# Patient Record
Sex: Male | Born: 1940
Health system: Southern US, Community
[De-identification: ages and names within clinical notes are randomized; demographics above are authoritative.]

## PROBLEM LIST (undated history)

## (undated) DIAGNOSIS — I2510 Atherosclerotic heart disease of native coronary artery without angina pectoris: Secondary | ICD-10-CM

## (undated) DIAGNOSIS — I499 Cardiac arrhythmia, unspecified: Secondary | ICD-10-CM

## (undated) DIAGNOSIS — J189 Pneumonia, unspecified organism: Secondary | ICD-10-CM

## (undated) DIAGNOSIS — I509 Heart failure, unspecified: Secondary | ICD-10-CM

## (undated) DIAGNOSIS — I219 Acute myocardial infarction, unspecified: Secondary | ICD-10-CM

## (undated) DIAGNOSIS — C61 Malignant neoplasm of prostate: Secondary | ICD-10-CM

## (undated) DIAGNOSIS — E119 Type 2 diabetes mellitus without complications: Secondary | ICD-10-CM

## (undated) DIAGNOSIS — E785 Hyperlipidemia, unspecified: Secondary | ICD-10-CM

## (undated) DIAGNOSIS — R011 Cardiac murmur, unspecified: Secondary | ICD-10-CM

## (undated) DIAGNOSIS — M1990 Unspecified osteoarthritis, unspecified site: Secondary | ICD-10-CM

## (undated) DIAGNOSIS — I639 Cerebral infarction, unspecified: Secondary | ICD-10-CM

## (undated) DIAGNOSIS — I10 Essential (primary) hypertension: Secondary | ICD-10-CM

## (undated) DIAGNOSIS — J449 Chronic obstructive pulmonary disease, unspecified: Secondary | ICD-10-CM

## (undated) DIAGNOSIS — I209 Angina pectoris, unspecified: Secondary | ICD-10-CM

## (undated) DIAGNOSIS — Z86711 Personal history of pulmonary embolism: Secondary | ICD-10-CM

## (undated) DIAGNOSIS — IMO0002 Reserved for concepts with insufficient information to code with codable children: Secondary | ICD-10-CM

## (undated) DIAGNOSIS — Z950 Presence of cardiac pacemaker: Secondary | ICD-10-CM

## (undated) DIAGNOSIS — D649 Anemia, unspecified: Secondary | ICD-10-CM

## (undated) DIAGNOSIS — R0600 Dyspnea, unspecified: Secondary | ICD-10-CM

## (undated) DIAGNOSIS — I270 Primary pulmonary hypertension: Secondary | ICD-10-CM

## (undated) DIAGNOSIS — Z8673 Personal history of transient ischemic attack (TIA), and cerebral infarction without residual deficits: Secondary | ICD-10-CM

## (undated) DIAGNOSIS — N189 Chronic kidney disease, unspecified: Secondary | ICD-10-CM

## (undated) DIAGNOSIS — M797 Fibromyalgia: Secondary | ICD-10-CM

## (undated) HISTORY — DX: Atherosclerotic heart disease of native coronary artery without angina pectoris: I25.10

## (undated) HISTORY — DX: Type 2 diabetes mellitus without complications: E11.9

## (undated) HISTORY — DX: Malignant neoplasm of prostate: C61

## (undated) HISTORY — DX: Reserved for concepts with insufficient information to code with codable children: IMO0002

## (undated) HISTORY — DX: Hyperlipidemia, unspecified: E78.5

## (undated) HISTORY — DX: Essential (primary) hypertension: I10

## (undated) HISTORY — DX: Personal history of pulmonary embolism: Z86.711

## (undated) HISTORY — DX: Primary pulmonary hypertension: I27.0

## (undated) HISTORY — PX: HERNIA REPAIR: SHX51

## (undated) HISTORY — PX: INSERT / REPLACE / REMOVE PACEMAKER: SUR710

## (undated) HISTORY — PX: NO PAST SURGERIES: SHX2092

## (undated) HISTORY — PX: BACK SURGERY: SHX140

## (undated) HISTORY — PX: CORONARY ANGIOPLASTY: SHX604

## (undated) NOTE — *Deleted (*Deleted)
Family Medicine Teaching [facility] Discharge Summary  Patient name: Bryan Carney Medical record number: [id number] Date of birth: Aug 21, 1940 Age: 27 y.o. Gender: male Date of Admission: 05/12/2020  Date of Discharge: *** Admitting Physician: Carney Living, MD  Primary Care Provider: Hurshel Party, NP Consultants: ***  Indication for Hospitalization: ***  Discharge Diagnoses/Problem List:  ***  Disposition: ***  Discharge Condition: ***  Discharge Exam: [facility] Course:  Cardiology consulted and multiple medicaiton changes were made:  Simvastatin changed to crestor 10 mg  Eliquis decreased to 2.5 mg BID due to age and weigh t Lasix 20 mg PO daily  ARB switched to losartan 25 mg  Cardizem was discontinued  Metoprolol was started; however, family noted past allergy to this medication and cards was okay with not discharging him on this medication    Consider starting SGLT-2, if affordable.  Issues for Follow Up:  1. ***  Significant Procedures: ***  Significant Labs and Imaging:  Recent Labs  Lab 05/12/20 1006 05/13/20 0057 05/14/20 0400  WBC 10.7* 9.9 8.1  HGB 8.1* 8.3* 8.8*  HCT 26.2* 25.4* 26.7*  PLT 285 285 289   Recent Labs  Lab 05/12/20 1006 05/12/20 1006 05/13/20 0057 05/14/20 0400  NA 138  --  138 136  K 4.6   < > 3.6 3.7  CL 105  --  102 99  CO2 25  --  27 28  GLUCOSE 262*  --  159* 134*  BUN 21  --  20 32*  CREATININE 1.62*  --  1.69* 2.15*  CALCIUM 8.8*  --  8.8* 8.5*  ALKPHOS  --   --   --  81  AST  --   --   --  22  ALT  --   --   --  26  ALBUMIN  --   --   --  2.7*   < > = values in this interval not displayed.    ***  Results/Tests Pending at Time of Discharge: ***  Discharge Medications:  Allergies as of 05/14/2020      Reactions   Lopressor [metoprolol Tartrate] Other (See Comments)   Severe BP drop   Lipitor [atorvastatin]    Muscle aches      Medication List    STOP taking these medications    benazepril 40 MG tablet Commonly known as: LOTENSIN   diltiazem 240 MG 24 hr capsule Commonly known as: CARDIZEM CD   simvastatin 20 MG tablet Commonly known as: ZOCOR     TAKE these medications   amiodarone 200 MG tablet Commonly known as: PACERONE TAKE ONE TABLET BY MOUTH DAILY--please make overdue follow up appt for further refills [phone] 1st attempt What changed: See the new instructions.   apixaban 2.5 MG Tabs tablet Commonly known as: ELIQUIS Take 1 tablet (2.5 mg total) by mouth 2 (two) times daily. What changed:   medication strength  how much to take   aspirin EC 81 MG tablet Take 81 mg by mouth daily.   cetirizine 10 MG tablet Commonly known as: ZYRTEC Take 10 mg by mouth daily.   ELDERBERRY PO Take 300 mg by mouth daily.   ferrous sulfate 325 (65 FE) MG tablet Take 325 mg by mouth 2 (two) times daily with a meal.   furosemide 20 MG tablet Commonly known as: LASIX Take 1 tablet (20 mg total) by mouth daily.   glimepiride 4 MG tablet Commonly known as: AMARYL Take 1  tablet (4 mg total) by mouth daily with breakfast.   linagliptin 5 MG Tabs tablet Commonly known as: TRADJENTA Take 1 tablet (5 mg total) by mouth daily.   losartan 25 MG tablet Commonly known as: COZAAR Take 1 tablet (25 mg total) by mouth daily. Start taking on: May 15, 2020   pantoprazole 40 MG tablet Commonly known as: PROTONIX Take 40 mg by mouth daily.   rosuvastatin 10 MG tablet Commonly known as: CRESTOR Take 1 tablet (10 mg total) by mouth daily. Start taking on: May 15, 2020       Discharge Instructions: Please refer to Patient Instructions section of EMR for full details.  Patient was counseled important signs and symptoms that should prompt return to medical care, changes in medications, dietary instructions, activity restrictions, and follow up appointments.   Follow-Up Appointments:   Melene Plan, MD 05/14/2020, 11:37 AM PGY-***, [facility]  Health Family Medicine

---
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
## 1995-02-23 HISTORY — PX: CAROTID ENDARTERECTOMY: SUR193

## 1997-10-15 HISTORY — PX: CARDIAC CATHETERIZATION: SHX172

## 1997-10-16 ENCOUNTER — Inpatient Hospital Stay (HOSPITAL_COMMUNITY): Admission: AD | Admit: 1997-10-16 | Discharge: 1997-11-01 | Payer: Self-pay | Admitting: Cardiovascular Disease

## 1997-10-19 DIAGNOSIS — Z951 Presence of aortocoronary bypass graft: Secondary | ICD-10-CM

## 1997-10-19 HISTORY — PX: CORONARY ARTERY BYPASS GRAFT: SHX141

## 1997-10-19 HISTORY — DX: Presence of aortocoronary bypass graft: Z95.1

## 1999-05-20 ENCOUNTER — Ambulatory Visit (HOSPITAL_COMMUNITY): Admission: RE | Admit: 1999-05-20 | Discharge: 1999-05-20 | Payer: Self-pay | Admitting: Orthopedic Surgery

## 1999-05-20 ENCOUNTER — Encounter: Payer: Self-pay | Admitting: Orthopedic Surgery

## 2000-02-25 ENCOUNTER — Emergency Department (HOSPITAL_COMMUNITY): Admission: EM | Admit: 2000-02-25 | Discharge: 2000-02-25 | Payer: Self-pay | Admitting: Emergency Medicine

## 2012-06-05 ENCOUNTER — Other Ambulatory Visit (HOSPITAL_COMMUNITY): Payer: Self-pay | Admitting: Cardiovascular Disease

## 2012-06-05 DIAGNOSIS — I2510 Atherosclerotic heart disease of native coronary artery without angina pectoris: Secondary | ICD-10-CM

## 2012-06-12 ENCOUNTER — Ambulatory Visit (HOSPITAL_COMMUNITY)
Admission: RE | Admit: 2012-06-12 | Discharge: 2012-06-12 | Disposition: A | Discharge status: Home / Self Care | Payer: Medicare Other | Source: Ambulatory Visit | Attending: Cardiovascular Disease | Admitting: Cardiovascular Disease

## 2012-06-12 DIAGNOSIS — Z951 Presence of aortocoronary bypass graft: Secondary | ICD-10-CM | POA: Insufficient documentation

## 2012-06-12 DIAGNOSIS — I739 Peripheral vascular disease, unspecified: Secondary | ICD-10-CM | POA: Insufficient documentation

## 2012-06-12 DIAGNOSIS — I2510 Atherosclerotic heart disease of native coronary artery without angina pectoris: Secondary | ICD-10-CM | POA: Insufficient documentation

## 2012-06-12 DIAGNOSIS — I10 Essential (primary) hypertension: Secondary | ICD-10-CM | POA: Insufficient documentation

## 2012-06-12 DIAGNOSIS — I252 Old myocardial infarction: Secondary | ICD-10-CM | POA: Insufficient documentation

## 2012-06-12 HISTORY — PX: CARDIOVASCULAR STRESS TEST: SHX262

## 2012-06-12 MED ORDER — REGADENOSON 0.4 MG/5ML IV SOLN
0.4000 mg | Freq: Once | INTRAVENOUS | Status: AC
  Administered 2012-06-12: 0.4 mg via INTRAVENOUS

## 2012-06-12 MED ORDER — AMINOPHYLLINE 25 MG/ML IV SOLN
75.0000 mg | Freq: Once | INTRAVENOUS | Status: AC
  Administered 2012-06-12: 75 mg via INTRAVENOUS

## 2012-06-12 MED ORDER — TECHNETIUM TC 99M SESTAMIBI GENERIC - CARDIOLITE
31.4000 | Freq: Once | INTRAVENOUS | Status: AC | PRN
  Administered 2012-06-12: 31 via INTRAVENOUS

## 2012-06-12 MED ORDER — TECHNETIUM TC 99M SESTAMIBI GENERIC - CARDIOLITE
10.8000 | Freq: Once | INTRAVENOUS | Status: AC | PRN
  Administered 2012-06-12: 11 via INTRAVENOUS

## 2012-06-12 NOTE — Procedures (Addendum)
[facility] [address] [place] [address] [place] [address] [phone]  Cardiology Nuclear Med Study  Bryan Carney is a 71 y.o. male     MRN : [id number]     DOB: 1941-01-20  Procedure Date: 06/12/2012  Nuclear Med Background Indication for Stress Test:  Evaluation for Ischemia, Graft Patency and Stent Patency History:  CAD, prior MI, PVD Cardiac Risk Factors: Family History - CAD, Hypertension, Lipids and NIDDM  Symptoms:  none   Nuclear Pre-Procedure Caffeine/Decaff Intake:  7:00pm NPO After: 5:00am   IV Site: R Antecubital  IV 0.9% NS with Angio Cath:  22g  Chest Size (in):  40 IV Started by: Koren Shiver, CNMT  Height: 5\' 6"  (1.676 m)  Cup Size: n/a  BMI:  Body mass index is 21.31 kg/(m^2). Weight:  132 lb (59.875 kg)   Tech Comments:  n/a    Nuclear Med Study 1 or 2 day study: 1 day  Stress Test Type:  Lexiscan  Order Authorizing Provider:  Nanetta Batty, MD   Resting Radionuclide: Technetium 56m Sestamibi  Resting Radionuclide Dose: 10.8 mCi   Stress Radionuclide:  Technetium 61m Sestamibi  Stress Radionuclide Dose: 31.4 mCi           Stress Protocol Rest HR: 68 Stress HR: 75  Rest BP: 135/88 Stress BP: 158/68  Exercise Time (min): n/a METS: n/a   Predicted Max HR: 148 bpm % Max HR: 54.05 bpm Rate Pressure Product: 95284   Dose of Adenosine (mg):  n/a Dose of Lexiscan: 0.4 mg  Dose of Atropine (mg): n/a Dose of Dobutamine: n/a mcg/kg/min (at max HR)  Stress Test Technologist: Esperanza Sheets, CCT Nuclear Technologist: Gonzella Lex, CNMT   Rest Procedure:  Myocardial perfusion imaging was performed at rest 45 minutes following the intravenous administration of Technetium 63m Sestamibi. Stress Procedure:  The patient received IV Lexiscan 0.4 mg over 15-seconds.  Technetium 47m Sestamibi injected at 30-seconds.  There were wasn't a significant increase in the baseline NSST changes with Lexiscan.  Quantitative  spect images were obtained after a 45 minute delay.  Transient Ischemic Dilatation (Normal <1.22):  1.12 Lung/Heart Ratio (Normal <0.45):  0.29 QGS EDV:  n/a ml QGS ESV:  n/a ml LV Ejection Fraction: Study not gated  Signed by .      Rest ECG: NSR with non-specific ST-T wave changes  Stress ECG: No significant change from baseline ECG  QPS Raw Data Images:  Normal; no motion artifact; normal heart/lung ratio. Stress Images:  Normal homogeneous uptake in all areas of the myocardium. Rest Images:  Normal homogeneous uptake in all areas of the myocardium. Subtraction (SDS):  No evidence of ischemia.  Impression Exercise Capacity:  Lexiscan with no exercise. BP Response:  Hypotensive blood pressure response. Clinical Symptoms:  Pt developed hypotension with Lexiscan and almost LOC but responded to IVF resusitation ECG Impression:  Insignificant upsloping ST segment depression. Comparison with Prior Nuclear Study: No significant change from previous study  Overall Impression:  Normal stress nuclear study.  LV Wall Motion:  Gating not performed secondary to sinus arrhythmia   [name], MD  06/13/2012 2:24 PM

## 2012-07-14 ENCOUNTER — Other Ambulatory Visit (HOSPITAL_COMMUNITY): Payer: Self-pay | Admitting: Cardiovascular Disease

## 2012-07-14 DIAGNOSIS — Z9889 Other specified postprocedural states: Secondary | ICD-10-CM

## 2012-08-06 ENCOUNTER — Inpatient Hospital Stay (HOSPITAL_COMMUNITY): Admission: RE | Admit: 2012-08-06 | Payer: Medicare Other | Source: Ambulatory Visit

## 2012-08-06 ENCOUNTER — Encounter (HOSPITAL_COMMUNITY): Payer: Medicare Other

## 2012-08-27 ENCOUNTER — Inpatient Hospital Stay (HOSPITAL_COMMUNITY): Admission: RE | Admit: 2012-08-27 | Payer: Medicare Other | Source: Ambulatory Visit

## 2012-08-27 ENCOUNTER — Ambulatory Visit (HOSPITAL_COMMUNITY)
Admission: RE | Admit: 2012-08-27 | Discharge: 2012-08-27 | Disposition: A | Discharge status: Home / Self Care | Payer: Medicare Other | Source: Ambulatory Visit | Attending: Cardiovascular Disease | Admitting: Cardiovascular Disease

## 2012-08-27 ENCOUNTER — Encounter (HOSPITAL_COMMUNITY): Payer: Medicare Other

## 2012-08-27 DIAGNOSIS — Z09 Encounter for follow-up examination after completed treatment for conditions other than malignant neoplasm: Secondary | ICD-10-CM | POA: Insufficient documentation

## 2012-08-27 HISTORY — PX: OTHER SURGICAL HISTORY: SHX169

## 2012-08-27 NOTE — Progress Notes (Signed)
Carotid Duplex Completed. Sturdivant, Rita D  

## 2012-10-14 ENCOUNTER — Encounter: Payer: Self-pay | Admitting: Pharmacist Clinician (PhC)/ Clinical Pharmacy Specialist

## 2012-10-14 DIAGNOSIS — Z7901 Long term (current) use of anticoagulants: Secondary | ICD-10-CM | POA: Insufficient documentation

## 2012-10-14 DIAGNOSIS — I2699 Other pulmonary embolism without acute cor pulmonale: Secondary | ICD-10-CM | POA: Insufficient documentation

## 2012-10-16 ENCOUNTER — Encounter: Payer: Self-pay | Admitting: Cardiovascular Disease

## 2012-11-27 ENCOUNTER — Ambulatory Visit (INDEPENDENT_AMBULATORY_CARE_PROVIDER_SITE_OTHER): Payer: Self-pay | Admitting: Pharmacist Clinician (PhC)/ Clinical Pharmacy Specialist

## 2012-11-27 DIAGNOSIS — Z7901 Long term (current) use of anticoagulants: Secondary | ICD-10-CM

## 2012-11-27 DIAGNOSIS — I2699 Other pulmonary embolism without acute cor pulmonale: Secondary | ICD-10-CM

## 2013-01-09 ENCOUNTER — Ambulatory Visit (INDEPENDENT_AMBULATORY_CARE_PROVIDER_SITE_OTHER): Payer: Self-pay | Admitting: Pharmacist Clinician (PhC)/ Clinical Pharmacy Specialist

## 2013-01-09 DIAGNOSIS — Z7901 Long term (current) use of anticoagulants: Secondary | ICD-10-CM

## 2013-01-09 DIAGNOSIS — I2699 Other pulmonary embolism without acute cor pulmonale: Secondary | ICD-10-CM

## 2013-01-09 LAB — PROTIME-INR

## 2013-02-03 LAB — PROTIME-INR: INR: 2.5 — AB (ref <=1.1)

## 2013-02-05 ENCOUNTER — Telehealth: Payer: Self-pay | Admitting: Pharmacist Clinician (PhC)/ Clinical Pharmacy Specialist

## 2013-02-05 NOTE — Telephone Encounter (Signed)
Wants to know what his PT is? Also need to know what dose he needs to take please.pt

## 2013-02-06 ENCOUNTER — Ambulatory Visit (INDEPENDENT_AMBULATORY_CARE_PROVIDER_SITE_OTHER): Payer: Self-pay | Admitting: Pharmacist Clinician (PhC)/ Clinical Pharmacy Specialist

## 2013-02-06 ENCOUNTER — Telehealth: Payer: Self-pay | Admitting: Cardiovascular Disease

## 2013-02-06 DIAGNOSIS — I2699 Other pulmonary embolism without acute cor pulmonale: Secondary | ICD-10-CM

## 2013-02-06 DIAGNOSIS — Z7901 Long term (current) use of anticoagulants: Secondary | ICD-10-CM

## 2013-02-06 NOTE — Telephone Encounter (Signed)
Had inr done 2 days ago  Have not gotten results  Would like before going into weekend  Please call

## 2013-02-06 NOTE — Telephone Encounter (Signed)
Pt notified of dose/INR

## 2013-05-01 LAB — PROTIME-INR: INR: 3 — AB (ref <=1.1)

## 2013-05-04 ENCOUNTER — Ambulatory Visit (INDEPENDENT_AMBULATORY_CARE_PROVIDER_SITE_OTHER): Payer: Medicare Other | Admitting: Pharmacist Clinician (PhC)/ Clinical Pharmacy Specialist

## 2013-05-04 DIAGNOSIS — Z7901 Long term (current) use of anticoagulants: Secondary | ICD-10-CM

## 2013-05-04 DIAGNOSIS — I2699 Other pulmonary embolism without acute cor pulmonale: Secondary | ICD-10-CM

## 2013-06-12 ENCOUNTER — Telehealth: Payer: Self-pay | Admitting: Cardiovascular Disease

## 2013-06-12 NOTE — Telephone Encounter (Signed)
Pt was returning your call.

## 2013-06-12 NOTE — Telephone Encounter (Signed)
Carotid dopplers due August 28, 2014 per last result Corrie Dandy 2014).  Unsure about stress test.  Message forwarded to K. Petra Kuba, RN to discuss w/ Dr. Allyson Sabal.

## 2013-06-12 NOTE — Telephone Encounter (Signed)
Carotid dopplers are due in March 2015 and no order was placed for stress test.  Last stress test was 12/13.  Normally insurance will not pay this soon without symptoms; therefore, we will not proceed with an order for one at this time.  We can discuss it at office visit in Jan. 2015.  Left message for patient.

## 2013-06-12 NOTE — Telephone Encounter (Signed)
I spoke patient's wife and she verbalized understanding.

## 2013-06-12 NOTE — Telephone Encounter (Signed)
Pt has appt with Bryan Carney on 07/21/13. Does he need carotid dopplers or a stress test before he comes in?

## 2013-07-07 ENCOUNTER — Ambulatory Visit (INDEPENDENT_AMBULATORY_CARE_PROVIDER_SITE_OTHER): Payer: Medicare Other | Admitting: Pharmacist Clinician (PhC)/ Clinical Pharmacy Specialist

## 2013-07-07 DIAGNOSIS — Z7901 Long term (current) use of anticoagulants: Secondary | ICD-10-CM

## 2013-07-07 DIAGNOSIS — I2699 Other pulmonary embolism without acute cor pulmonale: Secondary | ICD-10-CM

## 2013-07-07 LAB — PROTIME-INR: INR: 2.8 — AB (ref <=1.1)

## 2013-07-21 ENCOUNTER — Encounter: Payer: Self-pay | Admitting: Cardiovascular Disease

## 2013-07-21 ENCOUNTER — Ambulatory Visit (INDEPENDENT_AMBULATORY_CARE_PROVIDER_SITE_OTHER): Payer: Medicare Other | Admitting: Cardiovascular Disease

## 2013-07-21 VITALS — BP 158/88 | HR 68 | Ht 66.0 in | Wt 130.6 lb

## 2013-07-21 DIAGNOSIS — I2699 Other pulmonary embolism without acute cor pulmonale: Secondary | ICD-10-CM

## 2013-07-21 DIAGNOSIS — Z7901 Long term (current) use of anticoagulants: Secondary | ICD-10-CM

## 2013-07-21 DIAGNOSIS — I10 Essential (primary) hypertension: Secondary | ICD-10-CM

## 2013-07-21 DIAGNOSIS — I2510 Atherosclerotic heart disease of native coronary artery without angina pectoris: Secondary | ICD-10-CM

## 2013-07-21 NOTE — Progress Notes (Signed)
Patient ID: Bryan Carney, male   DOB: 04/02/1941, 73 y.o.   MRN: [id number]  Chief Complaint  Patient presents with  . Follow-up    yearly follow-up   HPI Bryan Carney is a 73 y.o. male with h/o CAD, PVOD, HTN, hyperlipidemia, DM2, remote pulmonary embolus on coumadin who presents for yearly follow up.  He had bypass grafting on April 1999 and a history of left carotid endarterectomy performed by Dr. Victorino Dike on August 31st 1996.  He had a a Myoview stress test on12/19/13 which was normal although he did become hypotensive with it. He had a carotid doppler on 08/27/2012 which showed mild to moderate fibrous plaque with 0-49% stenosis in the right ICA and trace to mild plaque in the left CEA site with 0-49% stenosis. Recommended repeat in 24 months.   He reports doing well. He continues to work at a [employer]. He denies any chest pain, shortness of breath, palpitations, lower extremity swelling, dizziness or light headedness. He doesn't exercise although he walks a lot through work. He has never smoked. He reports compliance with medications.  Past Medical History  Diagnosis Date  . Hyperlipidemia   . Hypertension   . Hx pulmonary embolism   . CAD (coronary artery disease)   . PVOD (pulmonary veno-occlusive disease)     Past Surgical History  Procedure Laterality Date  . Carotid doppler  08/27/2012    Rt bulb/proximal ICA demonstrated a mild-moderate amount of fibrous plaque without evidence of a significant diameter reduction or any other vascular abnormality.  . Cardiac catheterization  10/15/1997    Recommend complete revascularization by CABG  . Cardiovascular stress test  06/12/2012    No evidence of ischemia  . Carotid endarterectomy Left 02/23/1995  . Coronary artery bypass graft  10/19/1997    x5. LIMA-LAD, SVG-PDA, SVG-OM1, seq SVG-fist and second branches of ramus intermedius.    No family history on file.  Social History History  Substance Use Topics  . Smoking  status: Never Smoker   . Smokeless tobacco: Not on file  . Alcohol Use: Not on file    Allergies  Allergen Reactions  . Lopressor [Metoprolol Tartrate] Other (See Comments)    Severe BP drop    Current Outpatient Prescriptions  Medication Sig Dispense Refill  . amLODipine (NORVASC) 10 MG tablet Take 10 mg by mouth daily.      Marland Kitchen aspirin 325 MG tablet Take 325 mg by mouth daily.      . benazepril (LOTENSIN) 40 MG tablet Take 40 mg by mouth daily.      . cetirizine (ZYRTEC) 10 MG tablet Take 10 mg by mouth daily.      . metFORMIN (GLUCOPHAGE) 500 MG tablet Take 1,000 mg by mouth 2 (two) times daily with a meal.      . simvastatin (ZOCOR) 40 MG tablet Take 40 mg by mouth daily.      . tamsulosin (FLOMAX) 0.4 MG CAPS capsule Take 0.4 mg by mouth.      . warfarin (COUMADIN) 5 MG tablet Take 5 mg by mouth daily.       No current facility-administered medications for this visit.    Review of Systems Review of Systems Negative except per HPI Blood pressure 158/88, pulse 68, height 5\' 6"  (1.676 m), weight 130 lb 9.6 oz (59.24 kg).  Physical Exam Physical Exam General: well appearing, white, thin gentleman, in no acute distress HEENT: EOMI, no JVD, right carotid bruit appreciated CV: S1S2, RRR,  2/6 systolic murmur best heard at right sternal border Pulm: clear to auscultation, normal work of breathing, no wheezing or crackles Extr: no edema, palpable dorsalis pedis and radial pulses bilaterally   Data Reviewed EKG: sinus rhythm with rate of 68 bpm. Non specific ST and T wave changes. LVH with early repolarization. Normal QRS, PR interval and QTc  Lipid profile: 07/03/2013: LDL: 55, HDL: 63, Chol: 137, trig: 97  BMP:  Na: 142, K: 5.2, Chl: 103, bicarb: 23, BUN: 27, Cr: 1.37, glucose: 107 A1C: 7.1 Assessment    73 y.o. male with h/o CAD, PVOD, HTN, hyperlipidemia, DM2, remote pulmonary embolus on coumadin who presents for yearly follow up.      Plan    1. CAD: s/p bypass  grafting on April 1999. Normal lexiscan on 06/12/2012. Asymptomatic and doing well.  - stress test every 3 years - continue ASA, simvastatin, metoprolol and benazepril  2. PVOD: carotid doppler normal on 3/5/214 without significant plaque.  - repeat carotid doppler in 08/2014 3. HTN: mildly elevated today.  - follow up with PCP and continue with current therapy for now 4. DM2: followed by PCP, on metformin 5. Hyperlipidemia: well controled on simvastatin 40mg  daily.  6. H/o PE: on coumadin  Follow up in 1 year with Dr. William Dalton, [facility] 07/21/2013, 10:49 AM Family Medicine Resident, PGY-3

## 2013-07-21 NOTE — Patient Instructions (Signed)
Your physician recommends that you schedule a follow-up appointment in: 1 Year  

## 2013-08-26 LAB — PROTIME-INR: INR: 3.5 — AB (ref <=1.1)

## 2013-08-28 ENCOUNTER — Telehealth: Payer: Self-pay | Admitting: *Deleted

## 2013-08-28 ENCOUNTER — Ambulatory Visit (INDEPENDENT_AMBULATORY_CARE_PROVIDER_SITE_OTHER): Payer: Medicare Other | Admitting: Pharmacist Clinician (PhC)/ Clinical Pharmacy Specialist

## 2013-08-28 DIAGNOSIS — I2699 Other pulmonary embolism without acute cor pulmonale: Secondary | ICD-10-CM

## 2013-08-28 DIAGNOSIS — Z7901 Long term (current) use of anticoagulants: Secondary | ICD-10-CM

## 2013-08-28 NOTE — Telephone Encounter (Signed)
Pt called stating that he had his PT/INR checked at [facility] and he wanted to know what his results were.   JB

## 2013-08-28 NOTE — Telephone Encounter (Signed)
Called lab, see anticoag encounter

## 2013-09-23 LAB — PROTIME-INR: INR: 2.4 — AB (ref 0.9–1.1)

## 2013-09-24 ENCOUNTER — Ambulatory Visit (INDEPENDENT_AMBULATORY_CARE_PROVIDER_SITE_OTHER): Payer: Medicare Other | Admitting: Pharmacist Clinician (PhC)/ Clinical Pharmacy Specialist

## 2013-09-24 ENCOUNTER — Telehealth: Payer: Self-pay | Admitting: Cardiovascular Disease

## 2013-09-24 DIAGNOSIS — I2699 Other pulmonary embolism without acute cor pulmonale: Secondary | ICD-10-CM

## 2013-09-24 DIAGNOSIS — Z7901 Long term (current) use of anticoagulants: Secondary | ICD-10-CM

## 2013-09-24 NOTE — Telephone Encounter (Signed)
Wants to get inr results from yesterday.  Says no one has called him.  Please call

## 2013-09-24 NOTE — Telephone Encounter (Signed)
See anticoag note

## 2013-09-24 NOTE — Telephone Encounter (Signed)
Returned call and pt verified x 2.  Pt informed message received an no lab results in office.  Asked where he had lab checked and pt stated at [facility].  Pt informed Erasmo Downer likely hasn't received them, which is why he didn't receive a call.  Pt informed she will be notified results are pending receipt.  Pt verbalized understanding and agreed w/ plan.  Message forwarded to Tommy Medal, PharmD.

## 2013-12-02 LAB — PROTIME-INR: INR: 3.2 — AB (ref <=1.1)

## 2013-12-07 ENCOUNTER — Ambulatory Visit (INDEPENDENT_AMBULATORY_CARE_PROVIDER_SITE_OTHER): Payer: Medicare Other | Admitting: Pharmacist Clinician (PhC)/ Clinical Pharmacy Specialist

## 2013-12-07 ENCOUNTER — Telehealth: Payer: Self-pay | Admitting: Pharmacist Clinician (PhC)/ Clinical Pharmacy Specialist

## 2013-12-07 DIAGNOSIS — Z7901 Long term (current) use of anticoagulants: Secondary | ICD-10-CM

## 2013-12-07 DIAGNOSIS — I2699 Other pulmonary embolism without acute cor pulmonale: Secondary | ICD-10-CM

## 2013-12-07 NOTE — Telephone Encounter (Signed)
Patient is calling for PT results---states he had this done last week.

## 2013-12-09 ENCOUNTER — Telehealth: Payer: Self-pay | Admitting: Cardiovascular Disease

## 2013-12-09 DIAGNOSIS — Z7901 Long term (current) use of anticoagulants: Secondary | ICD-10-CM

## 2013-12-09 NOTE — Telephone Encounter (Signed)
She needs a standard order for his PT. Please fax [phone].

## 2013-12-09 NOTE — Telephone Encounter (Signed)
[facility] lab needing a standing order for INR faxed.  Done.

## 2013-12-17 ENCOUNTER — Telehealth: Payer: Self-pay | Admitting: Cardiovascular Disease

## 2013-12-17 DIAGNOSIS — I2782 Chronic pulmonary embolism: Secondary | ICD-10-CM

## 2013-12-17 NOTE — Telephone Encounter (Signed)
Order placed for standing order PT INR q2 weeks faxed to [facility]

## 2013-12-17 NOTE — Telephone Encounter (Signed)
She needs another standard order for his PT. Please fax [phone].

## 2013-12-22 LAB — PROTIME-INR: INR: 4 — AB (ref 0.9–1.1)

## 2013-12-23 ENCOUNTER — Telehealth: Payer: Self-pay | Admitting: Cardiovascular Disease

## 2013-12-23 ENCOUNTER — Ambulatory Visit (INDEPENDENT_AMBULATORY_CARE_PROVIDER_SITE_OTHER): Payer: Medicare Other | Admitting: Pharmacist Clinician (PhC)/ Clinical Pharmacy Specialist

## 2013-12-23 DIAGNOSIS — I2699 Other pulmonary embolism without acute cor pulmonale: Secondary | ICD-10-CM

## 2013-12-23 DIAGNOSIS — Z7901 Long term (current) use of anticoagulants: Secondary | ICD-10-CM

## 2013-12-23 NOTE — Telephone Encounter (Signed)
See anticoag note

## 2013-12-23 NOTE — Telephone Encounter (Signed)
Fax received. Placed in Kristin's box for review. Will forward to her.

## 2013-12-23 NOTE — Telephone Encounter (Signed)
New Prob    States she faxed over a protime on behalf of pt today around 3:30 PM. Calling to verify fax was received in our office.

## 2014-01-04 ENCOUNTER — Ambulatory Visit (INDEPENDENT_AMBULATORY_CARE_PROVIDER_SITE_OTHER): Payer: Medicare Other | Admitting: Pharmacist

## 2014-01-04 ENCOUNTER — Telehealth: Payer: Self-pay | Admitting: Physician Assistant

## 2014-01-04 DIAGNOSIS — Z7901 Long term (current) use of anticoagulants: Secondary | ICD-10-CM

## 2014-01-04 DIAGNOSIS — I2699 Other pulmonary embolism without acute cor pulmonale: Secondary | ICD-10-CM

## 2014-01-04 LAB — PROTIME-INR: INR: 2.9 — AB (ref 0.9–1.1)

## 2014-01-04 NOTE — Telephone Encounter (Signed)
The patient called wondering if he should take his coumadin.  INR is 2.9.  I told him to take his usual Monday dose of 7.5mg .  Favio Moder PA-C

## 2014-01-26 ENCOUNTER — Telehealth: Payer: Self-pay | Admitting: Cardiovascular Disease

## 2014-01-26 ENCOUNTER — Ambulatory Visit (INDEPENDENT_AMBULATORY_CARE_PROVIDER_SITE_OTHER): Payer: Medicare Other | Admitting: Pharmacist Clinician (PhC)/ Clinical Pharmacy Specialist

## 2014-01-26 DIAGNOSIS — Z7901 Long term (current) use of anticoagulants: Secondary | ICD-10-CM

## 2014-01-26 DIAGNOSIS — I2699 Other pulmonary embolism without acute cor pulmonale: Secondary | ICD-10-CM

## 2014-01-26 LAB — PROTIME-INR: INR: 2.4 — AB (ref 0.9–1.1)

## 2014-01-26 NOTE — Telephone Encounter (Signed)
New Prob    Requesting new standing orders for PT/INR for pt faxed to [phone].

## 2014-01-28 NOTE — Telephone Encounter (Signed)
Faxed orders to [facility] lab

## 2014-03-24 ENCOUNTER — Telehealth: Payer: Self-pay | Admitting: Cardiovascular Disease

## 2014-03-24 ENCOUNTER — Ambulatory Visit (INDEPENDENT_AMBULATORY_CARE_PROVIDER_SITE_OTHER): Payer: Medicare Other | Admitting: Pharmacist Clinician (PhC)/ Clinical Pharmacy Specialist

## 2014-03-24 DIAGNOSIS — I2699 Other pulmonary embolism without acute cor pulmonale: Secondary | ICD-10-CM

## 2014-03-24 DIAGNOSIS — Z7901 Long term (current) use of anticoagulants: Secondary | ICD-10-CM

## 2014-03-24 LAB — POCT INR: INR: 2.7

## 2014-03-24 LAB — PROTIME-INR: INR: 2.7 — AB (ref 0.9–1.1)

## 2014-03-24 NOTE — Telephone Encounter (Signed)
Received a call from [place] at [facility] stated she needed a new order for standing INRs and needs new ICD 10 code.Order and ICD 10 code faxed to her at fax # [phone].

## 2014-05-03 ENCOUNTER — Telehealth: Payer: Self-pay | Admitting: Cardiovascular Disease

## 2014-05-03 DIAGNOSIS — I6529 Occlusion and stenosis of unspecified carotid artery: Secondary | ICD-10-CM

## 2014-05-03 NOTE — Telephone Encounter (Signed)
Returned call to patient no answer.Left message on personal voice mail will send message to Dr.Berry's nurse Inez Catalina for advice.

## 2014-05-03 NOTE — Telephone Encounter (Signed)
Pt called in stating that he usually has a doppler done before he comes in to see Dr. [name] so he would like to know if he would like for him to have this done. Please call  Thanks

## 2014-05-10 NOTE — Telephone Encounter (Signed)
Left message with patient that order has been placed to repeat Carotid dopplers.

## 2014-05-13 ENCOUNTER — Telehealth (HOSPITAL_COMMUNITY): Payer: Self-pay | Admitting: *Deleted

## 2014-05-18 ENCOUNTER — Ambulatory Visit (INDEPENDENT_AMBULATORY_CARE_PROVIDER_SITE_OTHER): Payer: Medicare Other | Admitting: Pharmacist Clinician (PhC)/ Clinical Pharmacy Specialist

## 2014-05-18 DIAGNOSIS — I2699 Other pulmonary embolism without acute cor pulmonale: Secondary | ICD-10-CM

## 2014-05-18 LAB — PROTIME-INR: INR: 3.2 — AB (ref 0.9–1.1)

## 2014-06-02 ENCOUNTER — Ambulatory Visit (HOSPITAL_COMMUNITY)
Admission: RE | Admit: 2014-06-02 | Discharge: 2014-06-02 | Disposition: A | Discharge status: Home / Self Care | Payer: Medicare Other | Source: Ambulatory Visit | Attending: Cardiovascular Disease | Admitting: Cardiovascular Disease

## 2014-06-02 DIAGNOSIS — I6529 Occlusion and stenosis of unspecified carotid artery: Secondary | ICD-10-CM

## 2014-06-02 DIAGNOSIS — I6523 Occlusion and stenosis of bilateral carotid arteries: Secondary | ICD-10-CM | POA: Insufficient documentation

## 2014-06-02 NOTE — Progress Notes (Signed)
Carotid Duplex Completed. °Brianna L Mazza,RVT °

## 2014-06-16 ENCOUNTER — Encounter: Payer: Self-pay | Admitting: *Deleted

## 2014-06-23 ENCOUNTER — Ambulatory Visit (INDEPENDENT_AMBULATORY_CARE_PROVIDER_SITE_OTHER): Payer: Medicare Other | Admitting: Pharmacist Clinician (PhC)/ Clinical Pharmacy Specialist

## 2014-06-23 DIAGNOSIS — I2699 Other pulmonary embolism without acute cor pulmonale: Secondary | ICD-10-CM

## 2014-06-23 LAB — PROTIME-INR: INR: 3 — AB (ref 0.9–1.1)

## 2014-07-07 ENCOUNTER — Encounter: Payer: Self-pay | Admitting: Cardiovascular Disease

## 2014-07-07 ENCOUNTER — Ambulatory Visit (INDEPENDENT_AMBULATORY_CARE_PROVIDER_SITE_OTHER): Payer: Medicare Other | Admitting: Cardiovascular Disease

## 2014-07-07 DIAGNOSIS — I2510 Atherosclerotic heart disease of native coronary artery without angina pectoris: Secondary | ICD-10-CM | POA: Insufficient documentation

## 2014-07-07 DIAGNOSIS — E119 Type 2 diabetes mellitus without complications: Secondary | ICD-10-CM | POA: Insufficient documentation

## 2014-07-07 DIAGNOSIS — E785 Hyperlipidemia, unspecified: Secondary | ICD-10-CM | POA: Insufficient documentation

## 2014-07-07 DIAGNOSIS — I739 Peripheral vascular disease, unspecified: Secondary | ICD-10-CM

## 2014-07-07 DIAGNOSIS — I2583 Coronary atherosclerosis due to lipid rich plaque: Secondary | ICD-10-CM

## 2014-07-07 DIAGNOSIS — I779 Disorder of arteries and arterioles, unspecified: Secondary | ICD-10-CM | POA: Insufficient documentation

## 2014-07-07 DIAGNOSIS — I10 Essential (primary) hypertension: Secondary | ICD-10-CM

## 2014-07-07 MED ORDER — WARFARIN SODIUM 5 MG PO TABS: 5.0000 mg | ORAL_TABLET | Freq: Every day | ORAL | Status: DC

## 2014-07-07 NOTE — Assessment & Plan Note (Signed)
History of hyperlipidemia on simvastatin 40 mg a day followed by his PCP 

## 2014-07-07 NOTE — Progress Notes (Signed)
07/07/2014 JAFARI MCKILLOP   08-20-1940  [id number]  Primary Physician Laverna Peace, NP Primary Cardiologist: Lorretta Harp MD Renae Gloss   HPI:  Mr. Bryan Carney is a 74 year old thin-appearing married Caucasian male father of one daughter, grandfather of 2 grandchildren who is accompanied by his wife today. I saw him 2 years ago. He worked at a [employer] in [place]. He has a history of CAD and PAD. He had coronary artery bypass grafting April 1999 and left carotid endarterectomy by Dr. Kellie Simmering 02/23/95. His problems include treated hypertension and hyperlipidemia. He did have a remote pulmonary embolus on Coumadin anticoagulation. His last Myoview stress test performed 06/12/12 was normal. He is asymptomatic.   Current Outpatient Prescriptions  Medication Sig Dispense Refill  . amLODipine (NORVASC) 10 MG tablet Take 10 mg by mouth daily.    Marland Kitchen aspirin 325 MG tablet Take 325 mg by mouth daily.    . benazepril (LOTENSIN) 40 MG tablet Take 40 mg by mouth daily.    . cetirizine (ZYRTEC) 10 MG tablet Take 10 mg by mouth daily.    . ferrous fumarate (HEMOCYTE - 106 MG FE) 325 (106 FE) MG TABS tablet Take 1 tablet by mouth daily.    . Linagliptin-Metformin HCl 2.5-500 MG TABS Take 1 tablet by mouth 2 (two) times daily.    . simvastatin (ZOCOR) 40 MG tablet Take 40 mg by mouth daily.    . tamsulosin (FLOMAX) 0.4 MG CAPS capsule Take 0.4 mg by mouth.    . [DISCONTINUED] warfarin (COUMADIN) 5 MG tablet Take 5 mg by mouth daily.     No current facility-administered medications for this visit.    Allergies  Allergen Reactions  . Lopressor [Metoprolol Tartrate] Other (See Comments)    Severe BP drop    History   Social History  . Marital Status: Married    Spouse Name: N/A    Number of Children: N/A  . Years of Education: N/A   Occupational History  . Not on file.   Social History Main Topics  . Smoking status: Never Smoker   . Smokeless tobacco: Not on file  . Alcohol  Use: Not on file  . Drug Use: Not on file  . Sexual Activity: Not on file   Other Topics Concern  . Not on file   Social History Narrative     Review of Systems: General: negative for chills, fever, night sweats or weight changes.  Cardiovascular: negative for chest pain, dyspnea on exertion, edema, orthopnea, palpitations, paroxysmal nocturnal dyspnea or shortness of breath Dermatological: negative for rash Respiratory: negative for cough or wheezing Urologic: negative for hematuria Abdominal: negative for nausea, vomiting, diarrhea, bright red blood per rectum, melena, or hematemesis Neurologic: negative for visual changes, syncope, or dizziness All other systems reviewed and are otherwise negative except as noted above.    Blood pressure 140/70, pulse 66, height 5\' 5"  (1.651 m), weight 129 lb 4.8 oz (58.65 kg).  General appearance: alert and no distress Neck: no adenopathy, no carotid bruit, no JVD, supple, symmetrical, trachea midline and thyroid not enlarged, symmetric, no tenderness/mass/nodules Lungs: clear to auscultation bilaterally Heart: regular rate and rhythm, S1, S2 normal, no murmur, click, rub or gallop Extremities: extremities normal, atraumatic, no cyanosis or edema  EKG normal sinus rhythm at 66 without ST or T-wave changes. I personally reviewed this EKG  ASSESSMENT AND PLAN:   Hyperlipidemia History of hyperlipidemia on simvastatin 40 mg a day followed by his PCP  Essential hypertension History of hypertension with blood pressure measured at 140/70. He is on amlodipine, benazepril. Continue current meds at current dosing   Acute pulmonary embolism History of remote pulmonary embolism on Coumadin anticoagulation   Carotid artery disease History of carotid artery disease status post elective left carotid endarterectomy performed by Dr. Kellie Simmering 02/23/95. We've been following this by duplex ultrasound most recently performed last month that was  normal.   Coronary artery disease History of CAD status post stenting in the past and ultimately undergoing coronary artery bypass grafting April 1999. His last Myoview stress test 06/12/12 was normal. He denies chest pain or shortness of breath.       Lorretta Harp MD FACP,FACC,FAHA, [facility] 07/07/2014 4:32 PM

## 2014-07-07 NOTE — Patient Instructions (Signed)
Your physician wants you to follow-up in 1 year with Dr. Berry. You will receive a reminder letter in the mail 2 months in advance. If you do not receive a letter, please call our office to schedule the follow-up appointment.  

## 2014-07-07 NOTE — Assessment & Plan Note (Signed)
History of carotid artery disease status post elective left carotid endarterectomy performed by Dr. Kellie Simmering 02/23/95. We've been following this by duplex ultrasound most recently performed last month that was normal.

## 2014-07-07 NOTE — Assessment & Plan Note (Signed)
History of hypertension with blood pressure measured at 140/70. He is on amlodipine, benazepril. Continue current meds at current dosing

## 2014-07-07 NOTE — Assessment & Plan Note (Signed)
History of CAD status post stenting in the past and ultimately undergoing coronary artery bypass grafting April 1999. His last Myoview stress test 06/12/12 was normal. He denies chest pain or shortness of breath.

## 2014-07-07 NOTE — Assessment & Plan Note (Signed)
History of remote pulmonary embolism on Coumadin anticoagulation. 

## 2014-08-13 ENCOUNTER — Encounter: Payer: Self-pay | Admitting: Cardiovascular Disease

## 2014-08-13 LAB — PROTIME-INR

## 2014-08-13 LAB — POCT INR: INR: 3

## 2014-08-17 ENCOUNTER — Ambulatory Visit (INDEPENDENT_AMBULATORY_CARE_PROVIDER_SITE_OTHER): Payer: Medicare Other | Admitting: Pharmacist Clinician (PhC)/ Clinical Pharmacy Specialist

## 2014-08-17 DIAGNOSIS — C61 Malignant neoplasm of prostate: Secondary | ICD-10-CM

## 2014-08-17 DIAGNOSIS — I2699 Other pulmonary embolism without acute cor pulmonale: Secondary | ICD-10-CM

## 2014-09-13 ENCOUNTER — Telehealth: Payer: Self-pay | Admitting: Cardiovascular Disease

## 2014-09-13 NOTE — Telephone Encounter (Signed)
Per Answering Service: Please call,change in medication.

## 2014-09-13 NOTE — Telephone Encounter (Signed)
Returned call to patient. He was concerned about his newest warfarin Rx as it stated to take 1 tablet (5mg ) daily. He wanted to make sure he cuts those in half to get 7.5mg  on the days he takes that dose.   He also states he has been taking 10mg  5x week and 7.5mg  on Monday/Friday - this is different from last INR check on 2/23  He will have PT drawn at lab tomorrow.   Routed to [place] as [employer]

## 2014-09-14 LAB — PROTIME-INR
INR: 3.3 — AB (ref 0.9–1.1)
INR: 3.3 — AB (ref <=1.1)

## 2014-09-15 ENCOUNTER — Ambulatory Visit (INDEPENDENT_AMBULATORY_CARE_PROVIDER_SITE_OTHER): Payer: Medicare Other | Admitting: Pharmacist Clinician (PhC)/ Clinical Pharmacy Specialist

## 2014-09-15 DIAGNOSIS — I2699 Other pulmonary embolism without acute cor pulmonale: Secondary | ICD-10-CM

## 2014-10-12 LAB — PROTIME-INR: INR: 2.5 — AB (ref 0.9–1.1)

## 2014-10-14 ENCOUNTER — Ambulatory Visit (INDEPENDENT_AMBULATORY_CARE_PROVIDER_SITE_OTHER): Payer: Medicare Other | Admitting: Pharmacist Clinician (PhC)/ Clinical Pharmacy Specialist

## 2014-10-14 DIAGNOSIS — I2699 Other pulmonary embolism without acute cor pulmonale: Secondary | ICD-10-CM | POA: Diagnosis not present

## 2014-11-15 ENCOUNTER — Telehealth: Payer: Self-pay | Admitting: Cardiovascular Disease

## 2014-11-15 NOTE — Telephone Encounter (Signed)
Pharmacy called to inquire/get clarification on dose of warfarin.  Noted compliance w/ anticoag checks in office. Advised dose based on last encounter. They voiced understanding - will write as reported for patient for 30 day supply, refills at current dosing.

## 2014-11-16 LAB — PROTIME-INR: INR: 2.2 — AB (ref 0.9–1.1)

## 2014-11-18 ENCOUNTER — Ambulatory Visit (INDEPENDENT_AMBULATORY_CARE_PROVIDER_SITE_OTHER): Payer: Medicare Other | Admitting: Pharmacist Clinician (PhC)/ Clinical Pharmacy Specialist

## 2014-11-18 ENCOUNTER — Telehealth: Payer: Self-pay | Admitting: Cardiovascular Disease

## 2014-11-18 DIAGNOSIS — I2699 Other pulmonary embolism without acute cor pulmonale: Secondary | ICD-10-CM

## 2014-11-18 NOTE — Telephone Encounter (Signed)
INR reported to patient - he acknowledged understanding.

## 2014-11-18 NOTE — Telephone Encounter (Signed)
Pt called in stating that he had his INR checked at [facility] on 5/24 and he wanted to know what is results were. Please call   Thanks

## 2015-01-19 ENCOUNTER — Ambulatory Visit (INDEPENDENT_AMBULATORY_CARE_PROVIDER_SITE_OTHER): Payer: Medicare Other | Admitting: Pharmacist Clinician (PhC)/ Clinical Pharmacy Specialist

## 2015-01-19 DIAGNOSIS — I2699 Other pulmonary embolism without acute cor pulmonale: Secondary | ICD-10-CM

## 2015-01-19 LAB — PROTIME-INR: INR: 1.9 — AB (ref 0.9–1.1)

## 2015-03-15 LAB — PROTIME-INR: INR: 2.9 — AB (ref 0.9–1.1)

## 2015-03-16 ENCOUNTER — Ambulatory Visit (INDEPENDENT_AMBULATORY_CARE_PROVIDER_SITE_OTHER): Payer: Medicare Other | Admitting: Pharmacist Clinician (PhC)/ Clinical Pharmacy Specialist

## 2015-03-16 DIAGNOSIS — I2699 Other pulmonary embolism without acute cor pulmonale: Secondary | ICD-10-CM

## 2015-05-10 LAB — PROTIME-INR: INR: 3 — AB (ref 0.9–1.1)

## 2015-05-11 ENCOUNTER — Ambulatory Visit (INDEPENDENT_AMBULATORY_CARE_PROVIDER_SITE_OTHER): Payer: Medicare Other | Admitting: Pharmacist Clinician (PhC)/ Clinical Pharmacy Specialist

## 2015-05-11 DIAGNOSIS — Z7901 Long term (current) use of anticoagulants: Secondary | ICD-10-CM

## 2015-06-15 LAB — PROTIME-INR: INR: 2.9 — AB (ref 0.9–1.1)

## 2015-06-16 ENCOUNTER — Ambulatory Visit (INDEPENDENT_AMBULATORY_CARE_PROVIDER_SITE_OTHER): Payer: Medicare Other | Admitting: Pharmacist Clinician (PhC)/ Clinical Pharmacy Specialist

## 2015-06-16 DIAGNOSIS — Z7901 Long term (current) use of anticoagulants: Secondary | ICD-10-CM

## 2015-06-18 ENCOUNTER — Other Ambulatory Visit: Payer: Self-pay | Admitting: Cardiovascular Disease

## 2015-07-06 ENCOUNTER — Telehealth: Payer: Self-pay | Admitting: Cardiovascular Disease

## 2015-07-06 NOTE — Telephone Encounter (Signed)
Alyse Low is calling in to get a renewed order for the pt's Pro Time . Please f/u with her The fax number is [phone]  Thanks

## 2015-07-07 NOTE — Telephone Encounter (Signed)
Order faxed 1.12.17

## 2015-07-28 ENCOUNTER — Ambulatory Visit (INDEPENDENT_AMBULATORY_CARE_PROVIDER_SITE_OTHER): Payer: Medicare Other | Admitting: Pharmacist Clinician (PhC)/ Clinical Pharmacy Specialist

## 2015-07-28 DIAGNOSIS — Z7901 Long term (current) use of anticoagulants: Secondary | ICD-10-CM

## 2015-07-28 LAB — PROTIME-INR: INR: 2.7 — AB (ref 0.9–1.1)

## 2015-09-14 LAB — PROTIME-INR: INR: 2.3 — AB (ref 0.9–1.1)

## 2015-09-15 ENCOUNTER — Ambulatory Visit (INDEPENDENT_AMBULATORY_CARE_PROVIDER_SITE_OTHER): Payer: Medicare Other | Admitting: Pharmacist Clinician (PhC)/ Clinical Pharmacy Specialist

## 2015-09-15 DIAGNOSIS — Z7901 Long term (current) use of anticoagulants: Secondary | ICD-10-CM

## 2015-10-20 ENCOUNTER — Ambulatory Visit (INDEPENDENT_AMBULATORY_CARE_PROVIDER_SITE_OTHER): Payer: Medicare Other | Admitting: Pharmacist

## 2015-10-20 DIAGNOSIS — Z7901 Long term (current) use of anticoagulants: Secondary | ICD-10-CM

## 2015-10-20 LAB — POCT INR: INR: 2.2

## 2015-12-15 LAB — PROTIME-INR: INR: 2.4 — AB (ref 0.9–1.1)

## 2015-12-16 ENCOUNTER — Ambulatory Visit (INDEPENDENT_AMBULATORY_CARE_PROVIDER_SITE_OTHER): Payer: Medicare Other | Admitting: Pharmacist Clinician (PhC)/ Clinical Pharmacy Specialist

## 2015-12-16 DIAGNOSIS — Z7901 Long term (current) use of anticoagulants: Secondary | ICD-10-CM | POA: Diagnosis not present

## 2015-12-21 ENCOUNTER — Ambulatory Visit (INDEPENDENT_AMBULATORY_CARE_PROVIDER_SITE_OTHER): Payer: Medicare Other | Admitting: Allergy and Immunology

## 2015-12-21 ENCOUNTER — Encounter: Payer: Self-pay | Admitting: Allergy and Immunology

## 2015-12-21 ENCOUNTER — Telehealth: Payer: Self-pay

## 2015-12-21 VITALS — BP 140/80 | HR 88 | Temp 98.1°F | Resp 16 | Ht 64.33 in | Wt 131.0 lb

## 2015-12-21 DIAGNOSIS — J3089 Other allergic rhinitis: Secondary | ICD-10-CM

## 2015-12-21 DIAGNOSIS — J387 Other diseases of larynx: Secondary | ICD-10-CM | POA: Diagnosis not present

## 2015-12-21 DIAGNOSIS — K219 Gastro-esophageal reflux disease without esophagitis: Secondary | ICD-10-CM

## 2015-12-21 DIAGNOSIS — R05 Cough: Secondary | ICD-10-CM

## 2015-12-21 DIAGNOSIS — R059 Cough, unspecified: Secondary | ICD-10-CM

## 2015-12-21 DIAGNOSIS — J4531 Mild persistent asthma with (acute) exacerbation: Secondary | ICD-10-CM

## 2015-12-21 MED ORDER — ALBUTEROL SULFATE HFA 108 (90 BASE) MCG/ACT IN AERS
INHALATION_SPRAY | RESPIRATORY_TRACT | Status: DC
Start: 2015-12-21 — End: 2015-12-22

## 2015-12-21 MED ORDER — RANITIDINE HCL 300 MG PO TABS: 300.0000 mg | ORAL_TABLET | Freq: Every day | ORAL | Status: DC

## 2015-12-21 MED ORDER — PANTOPRAZOLE SODIUM 40 MG PO TBEC: DELAYED_RELEASE_TABLET | ORAL | Status: DC

## 2015-12-21 MED ORDER — METHYLPREDNISOLONE ACETATE 80 MG/ML IJ SUSP
80.0000 mg | Freq: Once | INTRAMUSCULAR | Status: AC
  Administered 2015-12-21: 80 mg via INTRAMUSCULAR

## 2015-12-21 NOTE — Patient Instructions (Addendum)
  1. Allergen avoidance measures  2. Treat reflux:   A. consolidate all caffeine and chocolate consumption  B. pantoprazole 40 mg one tablet in AM  C. ranitidine 300 mg one tablet in PM  3. Treat reflux:   A. OTC Rhinocort one spray each nostril one time per day  B. Asmanex HFA 200 - 2 inhalations twice a day. Spacer.  C. Depo-Medrol 80 IM delivered in clinic (check blood sugars)  4. If needed:   A. Zyrtec 10 mg one tablet one time per day  B. Proventil HFA 2 puffs every 4-6 hours  5. Evaluation with ENT to examine throat  6. Return to clinic in 3 weeks  7. Further evaluation? Chest CT scan?  8. Review previous chest x-ray report

## 2015-12-21 NOTE — Telephone Encounter (Signed)
I left message for patient to call our office. Please inform patient that he is scheduled for an appointment at [facility]NT on July 12th, needs to arrive at 2:30pm.  Thank you.

## 2015-12-21 NOTE — Progress Notes (Signed)
Dear Laverna Peace,  Thank you for referring Bryan Carney to the [facility] on 12/21/2015.   Below is a summation of this patient's evaluation and recommendations.  Thank you for your referral. I will keep you informed about this patient's response to treatment.   If you have any questions please to do hestitate to contact me.   Sincerely,  Jiles Prows, MD [facility]   ______________________________________________________________________    NEW PATIENT NOTE  Referring Provider: [name], NP Primary Provider: Laverna Peace, NP Date of office visit: 12/21/2015    Subjective:   Chief Complaint:  Bryan Carney (DOB: 1940/11/20) is a 75 y.o. male with a chief complaint of Cough  who presents to the clinic on 12/21/2015 with the following problems:  HPI: Bryan Carney presents to this clinic in evaluation of coughing. He has apparently been coughing for 6 months. He describes this cough is dry and sometimes having a stinging in his throat. There is no associated raspiness or hoarseness or throat clearing or swallowing difficulties. He does have spells of cough associated with gagging and retching and almost vomits. This appears to be precipitated by talking a lot or sometimes eating. He'll drink water which sometimes helps his cough. It does not appear as though any other therapy administered in the past has helped his cough very much. He did have his ace inhibitor removed 2 months ago but this did not affect his cough. He apparently had a chest x-ray which did not identify any significant abnormality. He denies any shortness of breath or chest tightness or wheezing. He did not have a history of childhood asthma. He does have some occasional nasal congestion and sneezing and occasionally having problems with itchy eyes but has no anosmia or ugly nasal discharge or ugly postnasal drip.  Past Medical History    Diagnosis Date  . Hyperlipidemia   . Hypertension   . Hx pulmonary embolism   . CAD (coronary artery disease)   . PVOD (pulmonary veno-occlusive disease) ([place])   . Diabetes ([place])   . Prostate cancer ([place])   . Squamous cell carcinoma [facility])     Past Surgical History  Procedure Laterality Date  . Carotid doppler  08/27/2012    Rt bulb/proximal ICA demonstrated a mild-moderate amount of fibrous plaque without evidence of a significant diameter reduction or any other vascular abnormality.  . Cardiac catheterization  10/15/1997    Recommend complete revascularization by CABG  . Cardiovascular stress test  06/12/2012    No evidence of ischemia  . Carotid endarterectomy Left 02/23/1995  . Coronary artery bypass graft  10/19/1997    x5. LIMA-LAD, SVG-PDA, SVG-OM1, seq SVG-fist and second branches of ramus intermedius.  . Back surgery        Medication List           aspirin 325 MG tablet  Take 325 mg by mouth daily.     ferrous fumarate 325 (106 Fe) MG Tabs tablet  Commonly known as:  HEMOCYTE - 106 mg FE  Take 1 tablet by mouth daily.     Linagliptin-Metformin HCl 2.5-500 MG Tabs  Take 1 tablet by mouth 2 (two) times daily.     simvastatin 40 MG tablet  Commonly known as:  ZOCOR  Take 40 mg by mouth daily.     tamsulosin 0.4 MG Caps capsule  Commonly known as:  FLOMAX  Take 0.4  mg by mouth.     warfarin 5 MG tablet  Commonly known as:  COUMADIN  Take 1.5 to 2 tablets by mouth daily as directed by coumadin clinic        Allergies  Allergen Reactions  . Lopressor [Metoprolol Tartrate] Other (See Comments)    Severe BP drop    Review of systems negative except as noted in HPI / PMHx or noted below:  Review of Systems  Constitutional: Negative.   HENT: Negative.   Eyes: Negative.   Respiratory: Negative.   Cardiovascular: Negative.   Gastrointestinal: Negative.   Genitourinary: Negative.   Musculoskeletal: Negative.   Skin: Negative.   Neurological: Negative.    Endo/Heme/Allergies: Negative.   Psychiatric/Behavioral: Negative.     Family History  Problem Relation Age of Onset  . Heart attack Father   . Cancer Sister   . Diabetes Brother   . Cancer Sister   . Diabetes Brother   . Diabetes Brother   . Diabetes Brother     Social History   Social History  . Marital Status: Married    Spouse Name: N/A  . Number of Children: N/A  . Years of Education: N/A   Occupational History  . Not on file.   Social History Main Topics  . Smoking status: Never Smoker   . Smokeless tobacco: Never Used  . Alcohol Use: Not on file  . Drug Use: Not on file  . Sexual Activity: Not on file   Other Topics Concern  . Not on file   Social History Narrative    Environmental and Social history  Lives in a house with a dry environment, no animals located inside the household, no carpeting in the bedroom, plastic on both the bed and pillow, and no smokers located inside the household. He is the owner of [employer].   Objective:   Filed Vitals:   12/21/15 1350  BP: 140/80  Pulse: 88  Temp: 98.1 F (36.7 C)  Resp: 16   Height: 5' 4.33" (163.4 cm) Weight: 131 lb (59.421 kg)  Physical Exam  Constitutional: He is well-developed, well-nourished, and in no distress.  Cough  HENT:  Head: Normocephalic. Head is without right periorbital erythema and without left periorbital erythema.  Right Ear: Tympanic membrane, external ear and ear canal normal.  Left Ear: Tympanic membrane, external ear and ear canal normal.  Nose: Nose normal. No mucosal edema or rhinorrhea.  Mouth/Throat: Oropharynx is clear and moist and mucous membranes are normal. No oropharyngeal exudate.  Eyes: Conjunctivae and lids are normal. Pupils are equal, round, and reactive to light.  Neck: Trachea normal. No tracheal deviation present. No thyromegaly present.  Cardiovascular: Normal rate, regular rhythm, S1 normal, S2 normal and normal heart sounds.   No murmur  heard. Pulmonary/Chest: Effort normal. No stridor. No tachypnea. No respiratory distress. He has no wheezes. He has no rales. He exhibits no tenderness.  Abdominal: Soft. He exhibits no distension and no mass. There is no hepatosplenomegaly. There is no tenderness. There is no rebound and no guarding.  Musculoskeletal: He exhibits no edema or tenderness.  Lymphadenopathy:       Head (right side): No tonsillar adenopathy present.       Head (left side): No tonsillar adenopathy present.    He has no cervical adenopathy.    He has no axillary adenopathy.  Neurological: He is alert. Gait normal.  Skin: No rash noted. He is not diaphoretic. No erythema. No pallor. Nails  show no clubbing.  Psychiatric: Mood and affect normal.     Diagnostics: Allergy skin tests were performed. He did not demonstrate any hypersensitivity against a screening panel of aeroallergens.  Spirometry was performed and demonstrated an FEV1 of 1.34 @ 58 % of predicted. Following the administration of nebulized albuterol his FEV1 rose to 2.11 which was an increase in the FEV1 of 57%. It should be noted that he had a difficult time performing this maneuver because of coughing.  The patient had an Asthma Control Test with the following results:  .     Assessment and Plan:    1. Cough   2. LPRD (laryngopharyngeal reflux disease)   3. Asthma, not well controlled, mild persistent, with acute exacerbation   4. Other allergic rhinitis      1. Allergen avoidance measures  2. Treat reflux:   A. consolidate all caffeine and chocolate consumption  B. pantoprazole 40 mg one tablet in AM  C. ranitidine 300 mg one tablet in PM  3. Treat reflux:   A. OTC Rhinocort one spray each nostril one time per day  B. Asmanex HFA 200 - 2 inhalations twice a day. Spacer.  C. Depo-Medrol 80 IM delivered in clinic (check blood sugars)  4. If needed:   A. Zyrtec 10 mg one tablet one time per day  B. Proventil HFA 2 puffs every 4-6  hours  5. Evaluation with ENT to examine throat  6. Return to clinic in 3 weeks  7. Further evaluation? Chest CT scan?  8. Review previous chest x-ray report  Joe will use a combination of therapy directed against both inflammation and reflux-induced respiratory disease and will have his throat evaluated by ear nose and throat physician and see what type of response we get with this therapy over the course the next 3 weeks. If he still remains with cough and I think he needs a chest CT scan. I did have a discussion with him about the use of a proton pump inhibitor and warfarin. He will have his bleeding time checked over the next week or 2.  I'll regroup with him in 3 weeks.  Jiles Prows, MD [facility]

## 2015-12-22 ENCOUNTER — Other Ambulatory Visit: Payer: Self-pay | Admitting: *Deleted

## 2015-12-22 MED ORDER — ALBUTEROL SULFATE HFA 108 (90 BASE) MCG/ACT IN AERS: INHALATION_SPRAY | RESPIRATORY_TRACT | Status: DC

## 2015-12-22 MED ORDER — BUDESONIDE 32 MCG/ACT NA SUSP
1.0000 | Freq: Every day | NASAL | Status: DC
Start: 2015-12-22 — End: 2016-05-09

## 2015-12-22 NOTE — Telephone Encounter (Signed)
Pt wife informed of appointment.

## 2016-01-11 ENCOUNTER — Ambulatory Visit (INDEPENDENT_AMBULATORY_CARE_PROVIDER_SITE_OTHER): Payer: Medicare Other | Admitting: Pharmacist

## 2016-01-11 DIAGNOSIS — Z7901 Long term (current) use of anticoagulants: Secondary | ICD-10-CM

## 2016-01-11 LAB — PROTIME-INR: INR: 3.6 — AB (ref 0.9–1.1)

## 2016-01-16 ENCOUNTER — Ambulatory Visit: Payer: Medicare Other | Admitting: Allergy and Immunology

## 2016-01-25 ENCOUNTER — Ambulatory Visit (INDEPENDENT_AMBULATORY_CARE_PROVIDER_SITE_OTHER): Payer: Medicare Other | Admitting: Pharmacist Clinician (PhC)/ Clinical Pharmacy Specialist

## 2016-01-25 DIAGNOSIS — Z7901 Long term (current) use of anticoagulants: Secondary | ICD-10-CM

## 2016-01-25 LAB — PROTIME-INR: INR: 3.4 — AB (ref 0.9–1.1)

## 2016-02-15 ENCOUNTER — Telehealth: Payer: Self-pay | Admitting: Cardiovascular Disease

## 2016-02-15 ENCOUNTER — Ambulatory Visit (INDEPENDENT_AMBULATORY_CARE_PROVIDER_SITE_OTHER): Payer: Medicare Other | Admitting: Pharmacist

## 2016-02-15 DIAGNOSIS — Z7901 Long term (current) use of anticoagulants: Secondary | ICD-10-CM

## 2016-02-15 DIAGNOSIS — Z79899 Other long term (current) drug therapy: Secondary | ICD-10-CM

## 2016-02-15 LAB — POCT INR: INR: 2.9

## 2016-02-15 NOTE — Telephone Encounter (Signed)
[facility] calling to get standing orders for pt's PT INR reordered.  Order submitted and faxed to requesting agency.

## 2016-03-14 LAB — PROTIME-INR

## 2016-03-17 LAB — PROTIME-INR: INR: 2.9 — AB (ref <=1.1)

## 2016-03-19 ENCOUNTER — Telehealth: Payer: Self-pay | Admitting: Pharmacist

## 2016-03-19 NOTE — Telephone Encounter (Signed)
Spoke to wife she stated that pt had INR drawn at [facility] lab last Wednesday. They wanted to obtain results and dosing instructions.  Per our records we have not received results.   We call lab and try to track down results. Spoke with Hilda Blades and INR being refaxed now.

## 2016-03-20 ENCOUNTER — Ambulatory Visit (INDEPENDENT_AMBULATORY_CARE_PROVIDER_SITE_OTHER): Payer: Medicare Other | Admitting: Pharmacist

## 2016-03-20 DIAGNOSIS — Z7901 Long term (current) use of anticoagulants: Secondary | ICD-10-CM

## 2016-03-20 NOTE — Telephone Encounter (Signed)
Results received. See anticoag note on 03/20/16

## 2016-03-20 NOTE — Telephone Encounter (Signed)
INR received was from 02/15/16. Spoke with Steffanie Dunn and she stated she would fax INR from 03/14/16 to our office now.

## 2016-03-22 NOTE — Telephone Encounter (Signed)
Called pt to make aware. He states he will go to lab sometime next week as he is unable to get there this week.   He states he is absolutely disgusted with all of this and very tempted to stop the medication altogether. He states he will remain on it for now.

## 2016-03-22 NOTE — Telephone Encounter (Signed)
Spoke with Steffanie Dunn and now she states the last INR they have on file is actually from 02/15/16 and not 03/14/16. She states she can see something from the 20th but there is no INR associated.  Will have pt go back to have INR redrawn. Confirmed with Steffanie Dunn he will not need additional order and is able to get drawn tomorrow morning.

## 2016-03-28 LAB — POCT INR: INR: 3

## 2016-03-29 ENCOUNTER — Ambulatory Visit (INDEPENDENT_AMBULATORY_CARE_PROVIDER_SITE_OTHER): Payer: Medicare Other | Admitting: Pharmacist

## 2016-05-09 ENCOUNTER — Encounter: Payer: Self-pay | Admitting: Cardiovascular Disease

## 2016-05-09 ENCOUNTER — Ambulatory Visit (INDEPENDENT_AMBULATORY_CARE_PROVIDER_SITE_OTHER): Payer: Medicare Other | Admitting: Cardiovascular Disease

## 2016-05-09 VITALS — BP 168/72 | HR 62 | Ht 64.0 in | Wt 132.0 lb

## 2016-05-09 DIAGNOSIS — I2510 Atherosclerotic heart disease of native coronary artery without angina pectoris: Secondary | ICD-10-CM

## 2016-05-09 DIAGNOSIS — I779 Disorder of arteries and arterioles, unspecified: Secondary | ICD-10-CM | POA: Diagnosis not present

## 2016-05-09 DIAGNOSIS — I2583 Coronary atherosclerosis due to lipid rich plaque: Secondary | ICD-10-CM

## 2016-05-09 DIAGNOSIS — I10 Essential (primary) hypertension: Secondary | ICD-10-CM | POA: Diagnosis not present

## 2016-05-09 DIAGNOSIS — I739 Peripheral vascular disease, unspecified: Secondary | ICD-10-CM

## 2016-05-09 DIAGNOSIS — R011 Cardiac murmur, unspecified: Secondary | ICD-10-CM | POA: Diagnosis not present

## 2016-05-09 NOTE — Assessment & Plan Note (Signed)
History of hypertension blood pressure measured 168/72. This is considerably higher than his other blood pressure readings in the past. He is on amlodipine and benazepril. Continue current meds at current dosing

## 2016-05-09 NOTE — Assessment & Plan Note (Signed)
History of coronary artery disease status post coronary artery bypass grafting April 1999. His last Myoview performed 06/12/12 was nonischemic. He denies chest pain or shortness of breath.

## 2016-05-09 NOTE — Assessment & Plan Note (Signed)
History of hyperlipidemia on statin therapy followed by his PCP 

## 2016-05-09 NOTE — Assessment & Plan Note (Signed)
History of carotid artery disease with very mild right ICA stenosis. He did have left carotid endarterectomy by Dr. Kellie Simmering 02/23/95.

## 2016-05-09 NOTE — Patient Instructions (Addendum)
Medication Instructions: Your physician recommends that you continue on your current medications as directed. Please refer to the Current Medication list given to you today.   Labwork: I will request labwork from Dr. Nicki Reaper.   Testing/Procedures: Your physician has requested that you have an echocardiogram. Echocardiography is a painless test that uses sound waves to create images of your heart. It provides your doctor with information about the size and shape of your heart and how well your heart's chambers and valves are working. This procedure takes approximately one hour. There are no restrictions for this procedure.  Any Other Instructions will be listed below:  OK to HOLD Coumadin for 5 days prior to Endoscopy.  Follow-Up: Your physician wants you to follow-up in: 1 year with Dr. [name]. You will receive a reminder letter in the mail two months in advance. If you don't receive a letter, please call our office to schedule the follow-up appointment.  If you need a refill on your cardiac medications before your next appointment, please call your pharmacy.

## 2016-05-09 NOTE — Assessment & Plan Note (Signed)
Remote pulmonary embolism after his coronary artery bypass grafting in 1999 on Coumadin anticoagulation.

## 2016-05-09 NOTE — Progress Notes (Signed)
05/09/2016 FARDIN MALSON   18-Aug-1940  [id number]  Primary Physician Laverna Peace, NP Primary Cardiologist: Lorretta Harp MD Renae Gloss  HPI:  Mr. Portee is a 75 year old thin-appearing married Caucasian male father of one daughter, grandfather of 2 grandchildren who is accompanied by his daughter [name] today. I saw him 07/07/14 . He worked at a [employer] in [place]. He has a history of CAD and PAD. He had coronary artery bypass grafting April 1999 and left carotid endarterectomy by Dr. Kellie Simmering 02/23/95. His problems include treated hypertension and hyperlipidemia. He did have a remote pulmonary embolus on Coumadin anticoagulation. His last Myoview stress test performed 06/12/12 was normal. He is asymptomatic. He has developed a cough over the last year which has been worked up by various subspecialties still without  an etiology.   Current Outpatient Prescriptions  Medication Sig Dispense Refill  . amLODipine (NORVASC) 5 MG tablet Take 5 mg by mouth daily.    Marland Kitchen aspirin 325 MG tablet Take 325 mg by mouth daily.    . benazepril (LOTENSIN) 40 MG tablet Take 40 mg by mouth daily.    . ferrous sulfate 325 (65 FE) MG tablet Take by mouth.    Marland Kitchen KOMBIGLYZE XR 10-998 MG TB24 1 tablet daily.  0  . Magnesium Oxide 400 (240 Mg) MG TABS Take 1 tablet by mouth daily.  3  . simvastatin (ZOCOR) 40 MG tablet Take 40 mg by mouth daily.    Marland Kitchen warfarin (COUMADIN) 5 MG tablet Take 1.5 to 2 tablets by mouth daily as directed by coumadin clinic 60 tablet 2   No current facility-administered medications for this visit.     Allergies  Allergen Reactions  . Lopressor [Metoprolol Tartrate] Other (See Comments)    Severe BP drop    Social History   Social History  . Marital status: Married    Spouse name: N/A  . Number of children: N/A  . Years of education: N/A   Occupational History  . Not on file.   Social History Main Topics  . Smoking status: Never Smoker  . Smokeless  tobacco: Never Used  . Alcohol use Not on file  . Drug use: Unknown  . Sexual activity: Not on file   Other Topics Concern  . Not on file   Social History Narrative  . No narrative on file     Review of Systems: General: negative for chills, fever, night sweats or weight changes.  Cardiovascular: negative for chest pain, dyspnea on exertion, edema, orthopnea, palpitations, paroxysmal nocturnal dyspnea or shortness of breath Dermatological: negative for rash Respiratory: negative for cough or wheezing Urologic: negative for hematuria Abdominal: negative for nausea, vomiting, diarrhea, bright red blood per rectum, melena, or hematemesis Neurologic: negative for visual changes, syncope, or dizziness All other systems reviewed and are otherwise negative except as noted above.    Blood pressure (!) 168/72, pulse 62, height 5\' 4"  (1.626 m), weight 132 lb (59.9 kg).  General appearance: alert and no distress Neck: no adenopathy, no JVD, supple, symmetrical, trachea midline, thyroid not enlarged, symmetric, no tenderness/mass/nodules and Soft bilateral carotid bruits Lungs: clear to auscultation bilaterally Heart: Soft outflow tract murmur probably aortic sclerosis Extremities: extremities normal, atraumatic, no cyanosis or edema  EKG normal sinus rhythm at 62 nonspecific ST and T-wave changes. I personally reviewed this EKG  ASSESSMENT AND PLAN:   Acute pulmonary embolism Remote pulmonary embolism after his coronary artery bypass grafting in 1999 on Coumadin anticoagulation.  Essential hypertension History of hypertension blood pressure measured 168/72. This is considerably higher than his other blood pressure readings in the past. He is on amlodipine and benazepril. Continue current meds at current dosing  Hyperlipidemia History of hyperlipidemia on statin therapy followed by his PCP  Coronary artery disease History of coronary artery disease status post coronary artery bypass  grafting April 1999. His last Myoview performed 06/12/12 was nonischemic. He denies chest pain or shortness of breath.  Carotid artery disease History of carotid artery disease with very mild right ICA stenosis. He did have left carotid endarterectomy by Dr. Kellie Simmering 02/23/95.      Lorretta Harp MD FACP,FACC,FAHA, [facility] 05/09/2016 4:16 PM

## 2016-05-22 ENCOUNTER — Telehealth: Payer: Self-pay | Admitting: Pharmacist Clinician (PhC)/ Clinical Pharmacy Specialist

## 2016-05-22 NOTE — Telephone Encounter (Signed)
Spoke with wife, patient overdue for INR.  He will get this week

## 2016-05-23 LAB — POCT INR: INR: 2.1

## 2016-05-24 ENCOUNTER — Ambulatory Visit (INDEPENDENT_AMBULATORY_CARE_PROVIDER_SITE_OTHER): Payer: Medicare Other | Admitting: Pharmacist Clinician (PhC)/ Clinical Pharmacy Specialist

## 2016-05-31 ENCOUNTER — Other Ambulatory Visit: Payer: Self-pay

## 2016-05-31 ENCOUNTER — Ambulatory Visit (HOSPITAL_COMMUNITY): Payer: Medicare Other | Attending: Cardiology

## 2016-05-31 DIAGNOSIS — R011 Cardiac murmur, unspecified: Secondary | ICD-10-CM | POA: Diagnosis not present

## 2016-05-31 DIAGNOSIS — I340 Nonrheumatic mitral (valve) insufficiency: Secondary | ICD-10-CM | POA: Insufficient documentation

## 2016-06-12 ENCOUNTER — Ambulatory Visit (INDEPENDENT_AMBULATORY_CARE_PROVIDER_SITE_OTHER): Payer: Medicare Other | Admitting: Pharmacist

## 2016-06-12 LAB — POCT INR: INR: 2

## 2016-07-26 ENCOUNTER — Ambulatory Visit (INDEPENDENT_AMBULATORY_CARE_PROVIDER_SITE_OTHER): Payer: Medicare Other | Admitting: Pharmacist

## 2016-07-26 DIAGNOSIS — I2699 Other pulmonary embolism without acute cor pulmonale: Secondary | ICD-10-CM

## 2016-07-26 LAB — PROTIME-INR: INR: 2.2 — AB (ref 0.9–1.1)

## 2016-08-29 ENCOUNTER — Ambulatory Visit (INDEPENDENT_AMBULATORY_CARE_PROVIDER_SITE_OTHER): Payer: Medicare Other | Admitting: Pharmacist

## 2016-08-29 DIAGNOSIS — I2699 Other pulmonary embolism without acute cor pulmonale: Secondary | ICD-10-CM

## 2016-08-29 LAB — PROTIME-INR: INR: 2.8 — AB (ref 0.9–1.1)

## 2016-10-10 ENCOUNTER — Ambulatory Visit (INDEPENDENT_AMBULATORY_CARE_PROVIDER_SITE_OTHER): Payer: Medicare Other | Admitting: Pharmacist

## 2016-10-10 DIAGNOSIS — Z7901 Long term (current) use of anticoagulants: Secondary | ICD-10-CM

## 2016-10-10 DIAGNOSIS — I2699 Other pulmonary embolism without acute cor pulmonale: Secondary | ICD-10-CM

## 2016-10-10 LAB — PROTIME-INR: INR: 2.2 — AB (ref 0.9–1.1)

## 2016-11-07 ENCOUNTER — Ambulatory Visit (INDEPENDENT_AMBULATORY_CARE_PROVIDER_SITE_OTHER): Payer: Medicare Other | Admitting: Pharmacist Clinician (PhC)/ Clinical Pharmacy Specialist

## 2016-11-07 DIAGNOSIS — I2699 Other pulmonary embolism without acute cor pulmonale: Secondary | ICD-10-CM

## 2016-11-07 DIAGNOSIS — Z7901 Long term (current) use of anticoagulants: Secondary | ICD-10-CM

## 2016-11-07 LAB — PROTIME-INR: INR: 2.7 — AB (ref 0.9–1.1)

## 2016-12-12 ENCOUNTER — Ambulatory Visit (INDEPENDENT_AMBULATORY_CARE_PROVIDER_SITE_OTHER): Payer: Medicare Other | Admitting: Pharmacist Clinician (PhC)/ Clinical Pharmacy Specialist

## 2016-12-12 DIAGNOSIS — I2699 Other pulmonary embolism without acute cor pulmonale: Secondary | ICD-10-CM

## 2016-12-12 DIAGNOSIS — Z7901 Long term (current) use of anticoagulants: Secondary | ICD-10-CM

## 2016-12-12 LAB — PROTIME-INR: INR: 2.7 — AB (ref 0.9–1.1)

## 2017-01-09 ENCOUNTER — Ambulatory Visit (INDEPENDENT_AMBULATORY_CARE_PROVIDER_SITE_OTHER): Payer: Medicare Other | Admitting: Pharmacist

## 2017-01-09 DIAGNOSIS — Z7901 Long term (current) use of anticoagulants: Secondary | ICD-10-CM

## 2017-01-09 DIAGNOSIS — I2699 Other pulmonary embolism without acute cor pulmonale: Secondary | ICD-10-CM

## 2017-01-09 LAB — PROTIME-INR: INR: 2.5 — AB (ref 0.9–1.1)

## 2017-02-06 ENCOUNTER — Ambulatory Visit (INDEPENDENT_AMBULATORY_CARE_PROVIDER_SITE_OTHER): Payer: Medicare Other | Admitting: Pharmacist

## 2017-02-06 DIAGNOSIS — I2699 Other pulmonary embolism without acute cor pulmonale: Secondary | ICD-10-CM

## 2017-02-06 DIAGNOSIS — Z7901 Long term (current) use of anticoagulants: Secondary | ICD-10-CM

## 2017-02-06 LAB — PROTIME-INR: INR: 1 (ref 0.9–1.1)

## 2017-02-15 ENCOUNTER — Ambulatory Visit (INDEPENDENT_AMBULATORY_CARE_PROVIDER_SITE_OTHER): Payer: Medicare Other | Admitting: Pharmacist Clinician (PhC)/ Clinical Pharmacy Specialist

## 2017-02-15 DIAGNOSIS — Z7901 Long term (current) use of anticoagulants: Secondary | ICD-10-CM

## 2017-02-15 DIAGNOSIS — I2699 Other pulmonary embolism without acute cor pulmonale: Secondary | ICD-10-CM

## 2017-02-15 LAB — PROTIME-INR: INR: 0.9 (ref 0.9–1.1)

## 2017-02-20 ENCOUNTER — Ambulatory Visit (INDEPENDENT_AMBULATORY_CARE_PROVIDER_SITE_OTHER): Payer: Medicare Other | Admitting: Pharmacist Clinician (PhC)/ Clinical Pharmacy Specialist

## 2017-02-20 DIAGNOSIS — Z7901 Long term (current) use of anticoagulants: Secondary | ICD-10-CM

## 2017-02-20 DIAGNOSIS — I2699 Other pulmonary embolism without acute cor pulmonale: Secondary | ICD-10-CM

## 2017-02-20 LAB — PROTIME-INR: INR: 1.4 — AB (ref 0.9–1.1)

## 2017-02-21 ENCOUNTER — Telehealth: Payer: Self-pay | Admitting: Pharmacist Clinician (PhC)/ Clinical Pharmacy Specialist

## 2017-02-21 NOTE — Telephone Encounter (Signed)
Coumadin letter 

## 2017-03-05 ENCOUNTER — Ambulatory Visit (INDEPENDENT_AMBULATORY_CARE_PROVIDER_SITE_OTHER): Payer: Medicare Other | Admitting: Pharmacist Clinician (PhC)/ Clinical Pharmacy Specialist

## 2017-03-05 DIAGNOSIS — I2699 Other pulmonary embolism without acute cor pulmonale: Secondary | ICD-10-CM | POA: Diagnosis not present

## 2017-03-05 DIAGNOSIS — Z7901 Long term (current) use of anticoagulants: Secondary | ICD-10-CM

## 2017-03-05 LAB — PROTIME-INR: INR: 2.9 — AB (ref 0.9–1.1)

## 2017-03-18 ENCOUNTER — Emergency Department (HOSPITAL_COMMUNITY): Payer: Medicare Other

## 2017-03-18 ENCOUNTER — Encounter (HOSPITAL_COMMUNITY): Payer: Self-pay | Admitting: *Deleted

## 2017-03-18 DIAGNOSIS — I2510 Atherosclerotic heart disease of native coronary artery without angina pectoris: Secondary | ICD-10-CM | POA: Insufficient documentation

## 2017-03-18 DIAGNOSIS — X58XXXA Exposure to other specified factors, initial encounter: Secondary | ICD-10-CM | POA: Insufficient documentation

## 2017-03-18 DIAGNOSIS — E119 Type 2 diabetes mellitus without complications: Secondary | ICD-10-CM | POA: Insufficient documentation

## 2017-03-18 DIAGNOSIS — Z79899 Other long term (current) drug therapy: Secondary | ICD-10-CM | POA: Insufficient documentation

## 2017-03-18 DIAGNOSIS — Y929 Unspecified place or not applicable: Secondary | ICD-10-CM | POA: Insufficient documentation

## 2017-03-18 DIAGNOSIS — Y999 Unspecified external cause status: Secondary | ICD-10-CM

## 2017-03-18 DIAGNOSIS — Z8546 Personal history of malignant neoplasm of prostate: Secondary | ICD-10-CM | POA: Insufficient documentation

## 2017-03-18 DIAGNOSIS — S20211A Contusion of right front wall of thorax, initial encounter: Secondary | ICD-10-CM

## 2017-03-18 DIAGNOSIS — Z85828 Personal history of other malignant neoplasm of skin: Secondary | ICD-10-CM | POA: Insufficient documentation

## 2017-03-18 DIAGNOSIS — Z7984 Long term (current) use of oral hypoglycemic drugs: Secondary | ICD-10-CM | POA: Insufficient documentation

## 2017-03-18 DIAGNOSIS — I10 Essential (primary) hypertension: Secondary | ICD-10-CM | POA: Insufficient documentation

## 2017-03-18 DIAGNOSIS — Z951 Presence of aortocoronary bypass graft: Secondary | ICD-10-CM

## 2017-03-18 DIAGNOSIS — Z7901 Long term (current) use of anticoagulants: Secondary | ICD-10-CM

## 2017-03-18 DIAGNOSIS — Z7982 Long term (current) use of aspirin: Secondary | ICD-10-CM

## 2017-03-18 DIAGNOSIS — D62 Acute posthemorrhagic anemia: Secondary | ICD-10-CM | POA: Diagnosis not present

## 2017-03-18 DIAGNOSIS — Y939 Activity, unspecified: Secondary | ICD-10-CM

## 2017-03-18 LAB — COMPREHENSIVE METABOLIC PANEL
ALBUMIN: 4 g/dL (ref 3.5–5.0)
ALT: 13 U/L — ABNORMAL LOW (ref 17–63)
ANION GAP: 5 (ref 5–15)
AST: 21 U/L (ref 15–41)
Alkaline Phosphatase: 68 U/L (ref 38–126)
BILIRUBIN TOTAL: 1.5 mg/dL — AB (ref 0.3–1.2)
BUN: 23 mg/dL — ABNORMAL HIGH (ref 6–20)
CO2: 24 mmol/L (ref 22–32)
Calcium: 8.8 mg/dL — ABNORMAL LOW (ref 8.9–10.3)
Chloride: 108 mmol/L (ref 101–111)
Creatinine, Ser: 1.59 mg/dL — ABNORMAL HIGH (ref 0.61–1.24)
GFR calc non Af Amer: 40 mL/min — ABNORMAL LOW (ref >60)
GFR, EST AFRICAN AMERICAN: 47 mL/min — AB (ref >60)
GLUCOSE: 197 mg/dL — AB (ref 65–99)
POTASSIUM: 4.5 mmol/L (ref 3.5–5.1)
SODIUM: 137 mmol/L (ref 135–145)
TOTAL PROTEIN: 5.9 g/dL — AB (ref 6.5–8.1)

## 2017-03-18 LAB — CBC
HEMATOCRIT: 34.4 % — AB (ref 39.0–52.0)
Hemoglobin: 11.2 g/dL — ABNORMAL LOW (ref 13.0–17.0)
MCH: 31.7 pg (ref 26.0–34.0)
MCHC: 32.6 g/dL (ref 30.0–36.0)
MCV: 97.5 fL (ref 78.0–100.0)
PLATELETS: 193 10*3/uL (ref 150–400)
RBC: 3.53 MIL/uL — ABNORMAL LOW (ref 4.22–5.81)
RDW: 12.5 % (ref 11.5–15.5)
WBC: 11 10*3/uL — AB (ref 4.0–10.5)

## 2017-03-18 LAB — PROTIME-INR
INR: 3.06
Prothrombin Time: 31.3 seconds — ABNORMAL HIGH (ref 11.4–15.2)

## 2017-03-18 NOTE — ED Triage Notes (Signed)
The pt is c/o rt upper chest bruise just below the clavicle  Andhe has a large bruise to the rt breast and the rt lateral chest  He just noticed when he   Took a shower this afternoon  He has not had an injury.  He just felt a soreness rt chest then he saw the bruises.  He is on coumadin and he took 7.5 mg today just before he noticed the bruising.  Alert oriented skin  Warm and dry

## 2017-03-19 ENCOUNTER — Emergency Department (HOSPITAL_COMMUNITY): Payer: Medicare Other

## 2017-03-19 ENCOUNTER — Emergency Department (HOSPITAL_COMMUNITY)
Admission: EM | Admit: 2017-03-19 | Discharge: 2017-03-19 | Disposition: A | Discharge status: Home / Self Care | Payer: Medicare Other | Source: Home / Self Care | Attending: Emergency Medicine | Admitting: Emergency Medicine

## 2017-03-19 ENCOUNTER — Other Ambulatory Visit: Payer: Self-pay

## 2017-03-19 DIAGNOSIS — S20211A Contusion of right front wall of thorax, initial encounter: Secondary | ICD-10-CM

## 2017-03-19 LAB — HEMOGLOBIN AND HEMATOCRIT, BLOOD
HCT: 28.8 % — ABNORMAL LOW (ref 39.0–52.0)
Hemoglobin: 9.5 g/dL — ABNORMAL LOW (ref 13.0–17.0)

## 2017-03-19 LAB — CBG MONITORING, ED: GLUCOSE-CAPILLARY: 156 mg/dL — AB (ref 65–99)

## 2017-03-19 MED ORDER — IOPAMIDOL (ISOVUE-300) INJECTION 61%
INTRAVENOUS | Status: AC
  Administered 2017-03-19: 75 mL
  Filled 2017-03-19: qty 75

## 2017-03-19 MED ORDER — SODIUM CHLORIDE 0.9 % IV BOLUS (SEPSIS)
1000.0000 mL | Freq: Once | INTRAVENOUS | Status: AC
  Administered 2017-03-19: 1000 mL via INTRAVENOUS

## 2017-03-19 MED ORDER — HYDROCODONE-ACETAMINOPHEN 5-325 MG PO TABS: 1.0000 | ORAL_TABLET | Freq: Four times a day (QID) | ORAL | 0 refills | Status: DC | PRN

## 2017-03-19 MED ORDER — OXYCODONE-ACETAMINOPHEN 5-325 MG PO TABS
1.0000 | ORAL_TABLET | Freq: Once | ORAL | Status: AC
  Administered 2017-03-19: 1 via ORAL
  Filled 2017-03-19: qty 1

## 2017-03-19 NOTE — ED Provider Notes (Addendum)
[facility] DEPT Provider Note   CSN: [id number] Arrival date & time: 03/18/17  1905     History   Chief Complaint Chief Complaint  Patient presents with  . Chest Pain    HPI Bryan Carney is a 76 y.o. male.  HPI  This is a 76 year old male with a history of coronary artery disease, diabetes, hypertension, hyperlipidemia who presents with chest wall pain. Patient noted onset of pain yesterday morning. Denies injury. As the day progressed he noted increasing bruising of the right pectoral area. Some of the pain is worse with movement. He is currently comfortable laying still. Denies any shortness of breath. Patient is on Coumadin. Last INR 2.9.  Past Medical History:  Diagnosis Date  . CAD (coronary artery disease)   . Diabetes ([place])   . Hx pulmonary embolism   . Hyperlipidemia   . Hypertension   . Prostate cancer ([place])   . PVOD (pulmonary veno-occlusive disease) ([place])   . Squamous cell carcinoma     Patient Active Problem List   Diagnosis Date Noted  . Essential hypertension 07/07/2014  . Hyperlipidemia 07/07/2014  . Coronary artery disease 07/07/2014  . Carotid artery disease ([place]) 07/07/2014  . Diabetes ([place]) 07/07/2014  . Long term (current) use of anticoagulants 10/14/2012  . Acute pulmonary embolism ([place]) 10/14/2012    Past Surgical History:  Procedure Laterality Date  . BACK SURGERY    . CARDIAC CATHETERIZATION  10/15/1997   Recommend complete revascularization by CABG  . CARDIOVASCULAR STRESS TEST  06/12/2012   No evidence of ischemia  . CAROTID DOPPLER  08/27/2012   Rt bulb/proximal ICA demonstrated a mild-moderate amount of fibrous plaque without evidence of a significant diameter reduction or any other vascular abnormality.  . CAROTID ENDARTERECTOMY Left 02/23/1995  . CORONARY ARTERY BYPASS GRAFT  10/19/1997   x5. LIMA-LAD, SVG-PDA, SVG-OM1, seq SVG-fist and second branches of ramus intermedius.       Home Medications    Prior to Admission  medications   Medication Sig Start Date End Date Taking? Authorizing Provider  amitriptyline (ELAVIL) 10 MG tablet Take 10 mg by mouth at bedtime.   Yes [provider]  amLODipine (NORVASC) 5 MG tablet Take 5 mg by mouth at bedtime.    Yes [provider]  aspirin 325 MG tablet Take 325 mg by mouth daily.   Yes [provider]  benazepril (LOTENSIN) 40 MG tablet Take 40 mg by mouth daily.   Yes [provider]  ferrous sulfate 325 (65 FE) MG tablet Take 325 mg by mouth daily with breakfast.    Yes [provider]  KOMBIGLYZE XR 10-998 MG TB24 Take 1 tablet by mouth daily.  09/26/15  Yes [provider]  simvastatin (ZOCOR) 40 MG tablet Take 40 mg by mouth daily.   Yes [provider]  warfarin (COUMADIN) 5 MG tablet Take 1.5 to 2 tablets by mouth daily as directed by coumadin clinic Patient taking differently: Take 7.5-10 mg by mouth See admin instructions. Take 10 mg on Thursday and Friday then take 7.5 mg all there other days 06/21/15  Yes Lorretta Harp, MD  HYDROcodone-acetaminophen (NORCO/VICODIN) 5-325 MG tablet Take 1 tablet by mouth every 6 (six) hours as needed. 03/19/17   Horton, Barbette Hair, MD    Family History Family History  Problem Relation Age of Onset  . Heart attack Father   . Cancer Sister   . Diabetes Brother   . Cancer Sister   . Diabetes  Brother   . Diabetes Brother   . Diabetes Brother     Social History Social History  Substance Use Topics  . Smoking status: Never Smoker  . Smokeless tobacco: Never Used  . Alcohol use No     Allergies   Lopressor [metoprolol tartrate]   Review of Systems Review of Systems  Constitutional: Negative for fever.  Respiratory: Negative for shortness of breath.   Cardiovascular: Positive for chest pain. Negative for leg swelling.  Gastrointestinal: Negative for nausea and vomiting.  Skin: Positive for color change. Negative for wound.  All other systems  reviewed and are negative.    Physical Exam Updated Vital Signs BP 131/85 (BP Location: Right Arm)   Pulse 67   Temp 98.8 F (37.1 C) (Oral)   Resp 15   Ht 5\' 5"  (1.651 m)   Wt 55.3 kg (122 lb)   SpO2 98%   BMI 20.30 kg/m   Physical Exam  Constitutional: He is oriented to person, place, and time. He appears well-developed and well-nourished.  HENT:  Head: Normocephalic and atraumatic.  Cardiovascular: Normal rate and regular rhythm.   Murmur heard. Pulmonary/Chest: Effort normal and breath sounds normal. No respiratory distress. He has no wheezes. He exhibits tenderness.  Extensive right pectoral swelling and tenderness to palpation with associated overlying ecchymosis extending inferior and laterally, no crepitus  Abdominal: Soft. There is no tenderness.  Neurological: He is alert and oriented to person, place, and time.  Skin: Skin is warm and dry.  Psychiatric: He has a normal mood and affect.  Nursing note and vitals reviewed.      ED Treatments / Results  Labs (all labs ordered are listed, but only abnormal results are displayed) Labs Reviewed  COMPREHENSIVE METABOLIC PANEL - Abnormal; Notable for the following:       Result Value   Glucose, Bld 197 (*)    BUN 23 (*)    Creatinine, Ser 1.59 (*)    Calcium 8.8 (*)    Total Protein 5.9 (*)    ALT 13 (*)    Total Bilirubin 1.5 (*)    GFR calc non Af Amer 40 (*)    GFR calc Af Amer 47 (*)    All other components within normal limits  CBC - Abnormal; Notable for the following:    WBC 11.0 (*)    RBC 3.53 (*)    Hemoglobin 11.2 (*)    HCT 34.4 (*)    All other components within normal limits  PROTIME-INR - Abnormal; Notable for the following:    Prothrombin Time 31.3 (*)    All other components within normal limits    EKG  EKG Interpretation None       Radiology Dg Chest 2 View  Result Date: 03/18/2017 CLINICAL DATA:  Bruising and swelling right upper and lateral chest. EXAM: CHEST  2 VIEW  COMPARISON:  10/20/2015 FINDINGS: AP and lateral sitting views were obtained. The lungs are clear without focal pneumonia, edema, pneumothorax or pleural effusion. The cardiopericardial silhouette is within normal limits for size. Patient is status post CABG The visualized bony structures of the thorax are intact. IMPRESSION: No active cardiopulmonary disease. Electronically Signed   By: Misty Stanley M.D.   On: 03/18/2017 20:23   Ct Chest W Contrast  Result Date: 03/19/2017 CLINICAL DATA:  Chest wall pain and bruising, progressive. On anticoagulation. EXAM: CT CHEST WITH CONTRAST TECHNIQUE: Multidetector CT imaging of the chest was performed during intravenous contrast administration. CONTRAST:  76mL  ISOVUE-300 IOPAMIDOL (ISOVUE-300) INJECTION 61% COMPARISON:  Radiograph yesterday. FINDINGS: Cardiovascular: Post CABG with atherosclerosis of native coronary arteries. No aortic dissection. Ascending thoracic aorta measures 3.4 cm with small atherosclerosis. Cannot assess for pulmonary embolus given phase of visualized thyroid gland is normal. No pericardial effusion. Contrast. Borderline heart size. Questionable right subclavian/axillary venous stenosis or occlusion with collaterals in the right axilla. Mediastinum/Nodes: No adenopathy. The esophagus is decompressed. The visualized thyroid gland is normal. Lungs/Pleura: Mild breathing motion artifact at the lung bases. Mild dependent atelectasis. No consolidation, pulmonary edema or pleural fluid. Upper Abdomen: Diminished enhancement of the right kidney with occlusion of the right renal artery at its origin, some distal arterial reconstitution. Right renal atrophy seen on prior abdominal CT. Musculoskeletal: Heterogeneous enlargement of the right pectoralis muscle and poorly defined subpectoral soft tissues consistent with hematoma. No active extravasation. Soft tissue stranding extends inferiorly on the chest wall. No rib fracture or evidence of acute osseous  abnormality. Degenerative change in the thoracic spine with vacuum phenomenon and endplate spurring. IMPRESSION: 1. Large right pectoralis/sub pectoralis chest wall hematoma. No active extravasation. 2. Dependent atelectasis in the lung bases, otherwise no acute intrathoracic abnormality. 3. Aortic atherosclerosis. Post CABG with native coronary artery calcifications. 4. Incidentally noted in the upper abdomen right renal atrophy and diminished enhancement. There is occlusion of the right renal artery at the origin, with some distal reconstitution of arterial flow. This is likely chronic as right renal atrophy was seen on prior noncontrast abdominal CT. 5. Probable right subclavian venous stenosis or occlusion with chest wall collaterals, suggesting this is chronic. Aortic Atherosclerosis (ICD10-I70.0). Electronically Signed   By: Jeb Levering M.D.   On: 03/19/2017 04:58    Procedures Procedures (including critical care time)  Medications Ordered in ED Medications  oxyCODONE-acetaminophen (PERCOCET/ROXICET) 5-325 MG per tablet 1 tablet (not administered)  iopamidol (ISOVUE-300) 61 % injection (75 mLs  Contrast Given 03/19/17 0400)     Initial Impression / Assessment and Plan / ED Course  I have reviewed the triage vital signs and the nursing notes.  Pertinent labs & imaging results that were available during my care of the patient were reviewed by me and considered in my medical decision making (see chart for details).     Patient presents with pain, swelling, and bruising to the right chest wall. He is nontoxic on exam. Vital signs reassuring. INR 3.0. He has extensive ecchymosis without known injury. Reports increasing pain and swelling while sitting in the waiting room. Unclear if he has any active bleeding at this time. Will obtain CT scan of the chest to evaluate for active bleeding. Ice was applied. Recommend compression. CT scan shows large right intramuscular hematoma. No active  bleeding noted. I discussed this with the patient and his family. Recommend holding Coumadin for one day. Will apply an Ace wrap to the chest for compression. Suspect spontaneous bleed worsened with anticoagulation.  After history, exam, and medical workup I feel the patient has been appropriately medically screened and is safe for discharge home. Pertinent diagnoses were discussed with the patient. Patient was given return precautions.  6:20 AM Informed by nursing that patient felt dizzy at discharge. He was noted to be diaphoretic. No chest pain. He was notably orthostatic.EKG, normal saline bolus, and repeat hemoglobin ordered.  Repeat hemoglobin dropped 2 g. This was over approximately 11 hours. Hematoma is no longer enlarging and there is no active bleeding on CT scan. I discussed this with the family and the patient. I  have offered admission given his age, anticoagulation, and dizziness. Patient would really prefer to be discharged home. Following 1 L of fluids, he was no longer dizzy and able to ambulate independently. I have recommended that he take it easy over the next couple of days and avoid quick transitions from lying to standing. Otherwise you compression and recommendations as above. Patient's daughter and wife are agreeable to plan. They were given return precautions.   Final Clinical Impressions(s) / ED Diagnoses   Final diagnoses:  Hematoma of right chest wall, initial encounter    New Prescriptions New Prescriptions   HYDROCODONE-ACETAMINOPHEN (NORCO/VICODIN) 5-325 MG TABLET    Take 1 tablet by mouth every 6 (six) hours as needed.     Merryl Hacker, MD 03/19/17 [id number]    Merryl Hacker, MD 03/20/17 [phone]

## 2017-03-19 NOTE — Discharge Instructions (Signed)
You were seen today and found to have a large hematoma of the chest wall. No active bleeding seen. Keep the area compressed. You can apply ice.  You may hold her Coumadin for 1 day. Monitor for increase in size or increased swelling. Follow-up with your primary physician for recheck.

## 2017-03-19 NOTE — ED Notes (Signed)
Sat up patient to apply ace wrap and discharge. While sitting at side of bed, patient became obtunded and diaphoretic. Returned patient to lying position and consciousness returned to baseline. Informed Horton MD. CBG checked. Lying BP was 122/80. Sitting BP was 77/42.

## 2017-03-20 ENCOUNTER — Encounter (HOSPITAL_COMMUNITY): Payer: Self-pay

## 2017-03-20 ENCOUNTER — Inpatient Hospital Stay (HOSPITAL_COMMUNITY)
Admission: EM | Admit: 2017-03-20 | Discharge: 2017-03-27 | DRG: 605 | Disposition: A | Discharge status: Rehabilitation Facility | Payer: Medicare Other | Attending: Internal Medicine | Admitting: Internal Medicine

## 2017-03-20 ENCOUNTER — Other Ambulatory Visit: Payer: Self-pay

## 2017-03-20 DIAGNOSIS — C61 Malignant neoplasm of prostate: Secondary | ICD-10-CM

## 2017-03-20 DIAGNOSIS — I82890 Acute embolism and thrombosis of other specified veins: Secondary | ICD-10-CM | POA: Diagnosis present

## 2017-03-20 DIAGNOSIS — I4891 Unspecified atrial fibrillation: Secondary | ICD-10-CM | POA: Diagnosis not present

## 2017-03-20 DIAGNOSIS — Z86711 Personal history of pulmonary embolism: Secondary | ICD-10-CM

## 2017-03-20 DIAGNOSIS — Z7409 Other reduced mobility: Secondary | ICD-10-CM | POA: Diagnosis not present

## 2017-03-20 DIAGNOSIS — D72829 Elevated white blood cell count, unspecified: Secondary | ICD-10-CM | POA: Diagnosis not present

## 2017-03-20 DIAGNOSIS — E1151 Type 2 diabetes mellitus with diabetic peripheral angiopathy without gangrene: Secondary | ICD-10-CM | POA: Diagnosis present

## 2017-03-20 DIAGNOSIS — J9811 Atelectasis: Secondary | ICD-10-CM | POA: Diagnosis present

## 2017-03-20 DIAGNOSIS — I480 Paroxysmal atrial fibrillation: Secondary | ICD-10-CM

## 2017-03-20 DIAGNOSIS — X58XXXA Exposure to other specified factors, initial encounter: Secondary | ICD-10-CM | POA: Diagnosis present

## 2017-03-20 DIAGNOSIS — Z794 Long term (current) use of insulin: Secondary | ICD-10-CM

## 2017-03-20 DIAGNOSIS — I2789 Other specified pulmonary heart diseases: Secondary | ICD-10-CM | POA: Diagnosis present

## 2017-03-20 DIAGNOSIS — I340 Nonrheumatic mitral (valve) insufficiency: Secondary | ICD-10-CM | POA: Diagnosis present

## 2017-03-20 DIAGNOSIS — E785 Hyperlipidemia, unspecified: Secondary | ICD-10-CM | POA: Diagnosis present

## 2017-03-20 DIAGNOSIS — Z7901 Long term (current) use of anticoagulants: Secondary | ICD-10-CM

## 2017-03-20 DIAGNOSIS — Z79899 Other long term (current) drug therapy: Secondary | ICD-10-CM

## 2017-03-20 DIAGNOSIS — R791 Abnormal coagulation profile: Secondary | ICD-10-CM | POA: Diagnosis present

## 2017-03-20 DIAGNOSIS — I951 Orthostatic hypotension: Secondary | ICD-10-CM | POA: Diagnosis present

## 2017-03-20 DIAGNOSIS — J690 Pneumonitis due to inhalation of food and vomit: Secondary | ICD-10-CM | POA: Diagnosis not present

## 2017-03-20 DIAGNOSIS — D689 Coagulation defect, unspecified: Secondary | ICD-10-CM | POA: Diagnosis present

## 2017-03-20 DIAGNOSIS — I2583 Coronary atherosclerosis due to lipid rich plaque: Secondary | ICD-10-CM | POA: Diagnosis present

## 2017-03-20 DIAGNOSIS — R410 Disorientation, unspecified: Secondary | ICD-10-CM | POA: Diagnosis not present

## 2017-03-20 DIAGNOSIS — I8290 Acute embolism and thrombosis of unspecified vein: Secondary | ICD-10-CM | POA: Diagnosis not present

## 2017-03-20 DIAGNOSIS — S20211A Contusion of right front wall of thorax, initial encounter: Principal | ICD-10-CM | POA: Diagnosis present

## 2017-03-20 DIAGNOSIS — R0902 Hypoxemia: Secondary | ICD-10-CM

## 2017-03-20 DIAGNOSIS — I2510 Atherosclerotic heart disease of native coronary artery without angina pectoris: Secondary | ICD-10-CM | POA: Diagnosis present

## 2017-03-20 DIAGNOSIS — R05 Cough: Secondary | ICD-10-CM | POA: Diagnosis not present

## 2017-03-20 DIAGNOSIS — M7981 Nontraumatic hematoma of soft tissue: Secondary | ICD-10-CM | POA: Diagnosis present

## 2017-03-20 DIAGNOSIS — D62 Acute posthemorrhagic anemia: Secondary | ICD-10-CM | POA: Diagnosis present

## 2017-03-20 DIAGNOSIS — E871 Hypo-osmolality and hyponatremia: Secondary | ICD-10-CM | POA: Diagnosis present

## 2017-03-20 DIAGNOSIS — I82621 Acute embolism and thrombosis of deep veins of right upper extremity: Secondary | ICD-10-CM | POA: Diagnosis not present

## 2017-03-20 DIAGNOSIS — I129 Hypertensive chronic kidney disease with stage 1 through stage 4 chronic kidney disease, or unspecified chronic kidney disease: Secondary | ICD-10-CM | POA: Diagnosis present

## 2017-03-20 DIAGNOSIS — Z23 Encounter for immunization: Secondary | ICD-10-CM | POA: Diagnosis present

## 2017-03-20 DIAGNOSIS — I739 Peripheral vascular disease, unspecified: Secondary | ICD-10-CM | POA: Diagnosis present

## 2017-03-20 DIAGNOSIS — T45515A Adverse effect of anticoagulants, initial encounter: Secondary | ICD-10-CM | POA: Diagnosis present

## 2017-03-20 DIAGNOSIS — R5381 Other malaise: Secondary | ICD-10-CM | POA: Diagnosis not present

## 2017-03-20 DIAGNOSIS — E1165 Type 2 diabetes mellitus with hyperglycemia: Secondary | ICD-10-CM | POA: Diagnosis not present

## 2017-03-20 DIAGNOSIS — Z888 Allergy status to other drugs, medicaments and biological substances status: Secondary | ICD-10-CM

## 2017-03-20 DIAGNOSIS — Z7982 Long term (current) use of aspirin: Secondary | ICD-10-CM

## 2017-03-20 DIAGNOSIS — D6832 Hemorrhagic disorder due to extrinsic circulating anticoagulants: Secondary | ICD-10-CM | POA: Diagnosis present

## 2017-03-20 DIAGNOSIS — T148XXA Other injury of unspecified body region, initial encounter: Secondary | ICD-10-CM | POA: Diagnosis present

## 2017-03-20 DIAGNOSIS — I10 Essential (primary) hypertension: Secondary | ICD-10-CM | POA: Diagnosis present

## 2017-03-20 DIAGNOSIS — E119 Type 2 diabetes mellitus without complications: Secondary | ICD-10-CM | POA: Diagnosis not present

## 2017-03-20 DIAGNOSIS — S20211S Contusion of right front wall of thorax, sequela: Secondary | ICD-10-CM

## 2017-03-20 DIAGNOSIS — E46 Unspecified protein-calorie malnutrition: Secondary | ICD-10-CM | POA: Diagnosis not present

## 2017-03-20 DIAGNOSIS — N183 Chronic kidney disease, stage 3 (moderate): Secondary | ICD-10-CM | POA: Diagnosis present

## 2017-03-20 DIAGNOSIS — N179 Acute kidney failure, unspecified: Secondary | ICD-10-CM | POA: Diagnosis present

## 2017-03-20 DIAGNOSIS — Z951 Presence of aortocoronary bypass graft: Secondary | ICD-10-CM

## 2017-03-20 DIAGNOSIS — R5383 Other fatigue: Secondary | ICD-10-CM

## 2017-03-20 LAB — CBC
HCT: 20 % — ABNORMAL LOW (ref 39.0–52.0)
HEMATOCRIT: 19 % — AB (ref 39.0–52.0)
HEMOGLOBIN: 7 g/dL — AB (ref 13.0–17.0)
Hemoglobin: 6.4 g/dL — CL (ref 13.0–17.0)
MCH: 32.3 pg (ref 26.0–34.0)
MCH: 32.3 pg (ref 26.0–34.0)
MCHC: 33.7 g/dL (ref 30.0–36.0)
MCHC: 35 g/dL (ref 30.0–36.0)
MCV: 96 fL (ref 78.0–100.0)
MCV: 92.2 fL (ref 78.0–100.0)
Platelets: 154 10*3/uL (ref 150–400)
Platelets: 118 10*3/uL — ABNORMAL LOW (ref 150–400)
RBC: 1.98 MIL/uL — ABNORMAL LOW (ref 4.22–5.81)
RBC: 2.17 MIL/uL — AB (ref 4.22–5.81)
RDW: 12.6 % (ref 11.5–15.5)
RDW: 15.3 % (ref 11.5–15.5)
WBC: 12.4 10*3/uL — ABNORMAL HIGH (ref 4.0–10.5)
WBC: 12.7 10*3/uL — AB (ref 4.0–10.5)

## 2017-03-20 LAB — PROTIME-INR
INR: 4.82
INR: 1.28
PROTHROMBIN TIME: 15.9 s — AB (ref 11.4–15.2)
Prothrombin Time: 47.1 seconds — ABNORMAL HIGH (ref 11.4–15.2)

## 2017-03-20 LAB — BASIC METABOLIC PANEL
ANION GAP: 6 (ref 5–15)
BUN: 20 mg/dL (ref 6–20)
CALCIUM: 7.3 mg/dL — AB (ref 8.9–10.3)
CO2: 21 mmol/L — ABNORMAL LOW (ref 22–32)
CREATININE: 1.52 mg/dL — AB (ref 0.61–1.24)
Chloride: 105 mmol/L (ref 101–111)
GFR calc Af Amer: 49 mL/min — ABNORMAL LOW (ref >60)
GFR, EST NON AFRICAN AMERICAN: 42 mL/min — AB (ref >60)
GLUCOSE: 340 mg/dL — AB (ref 65–99)
Potassium: 4.5 mmol/L (ref 3.5–5.1)
Sodium: 132 mmol/L — ABNORMAL LOW (ref 135–145)

## 2017-03-20 LAB — GLUCOSE, CAPILLARY
GLUCOSE-CAPILLARY: 132 mg/dL — AB (ref 65–99)
Glucose-Capillary: 192 mg/dL — ABNORMAL HIGH (ref 65–99)

## 2017-03-20 LAB — PREPARE RBC (CROSSMATCH)

## 2017-03-20 LAB — CBG MONITORING, ED
GLUCOSE-CAPILLARY: 272 mg/dL — AB (ref 65–99)
GLUCOSE-CAPILLARY: 209 mg/dL — AB (ref 65–99)
Glucose-Capillary: 311 mg/dL — ABNORMAL HIGH (ref 65–99)
Glucose-Capillary: 267 mg/dL — ABNORMAL HIGH (ref 65–99)

## 2017-03-20 LAB — ABO/RH: ABO/RH(D): O NEG

## 2017-03-20 LAB — MRSA PCR SCREENING: MRSA BY PCR: NEGATIVE

## 2017-03-20 MED ORDER — SIMVASTATIN 40 MG PO TABS
40.0000 mg | ORAL_TABLET | Freq: Every day | ORAL | Status: DC
  Administered 2017-03-20 – 2017-03-21 (×2): 40 mg via ORAL
  Filled 2017-03-20 (×3): qty 1

## 2017-03-20 MED ORDER — INSULIN ASPART 100 UNIT/ML ~~LOC~~ SOLN
0.0000 [IU] | Freq: Three times a day (TID) | SUBCUTANEOUS | Status: DC
  Administered 2017-03-21: 3 [IU] via SUBCUTANEOUS

## 2017-03-20 MED ORDER — SIMVASTATIN 40 MG PO TABS
40.0000 mg | ORAL_TABLET | Freq: Every day | ORAL | Status: DC
  Filled 2017-03-20: qty 1

## 2017-03-20 MED ORDER — FERROUS SULFATE 325 (65 FE) MG PO TABS
325.0000 mg | ORAL_TABLET | Freq: Every day | ORAL | Status: DC
  Administered 2017-03-21 – 2017-03-27 (×7): 325 mg via ORAL
  Filled 2017-03-20 (×7): qty 1

## 2017-03-20 MED ORDER — INSULIN ASPART 100 UNIT/ML ~~LOC~~ SOLN: 2.0000 [IU] | SUBCUTANEOUS | Status: DC

## 2017-03-20 MED ORDER — ONDANSETRON HCL 4 MG/2ML IJ SOLN: 4.0000 mg | Freq: Four times a day (QID) | INTRAMUSCULAR | Status: DC | PRN

## 2017-03-20 MED ORDER — INSULIN ASPART 100 UNIT/ML ~~LOC~~ SOLN
0.0000 [IU] | Freq: Every day | SUBCUTANEOUS | Status: DC
  Administered 2017-03-22: 4 [IU] via SUBCUTANEOUS
  Administered 2017-03-24 – 2017-03-25 (×2): 2 [IU] via SUBCUTANEOUS

## 2017-03-20 MED ORDER — VITAMIN K1 10 MG/ML IJ SOLN
10.0000 mg | Freq: Once | INTRAVENOUS | Status: AC
  Administered 2017-03-20: 10 mg via INTRAVENOUS
  Filled 2017-03-20: qty 1

## 2017-03-20 MED ORDER — OXYCODONE HCL 5 MG PO TABS
5.0000 mg | ORAL_TABLET | ORAL | Status: DC | PRN
  Filled 2017-03-20: qty 1

## 2017-03-20 MED ORDER — ONDANSETRON HCL 4 MG PO TABS: 4.0000 mg | ORAL_TABLET | Freq: Four times a day (QID) | ORAL | Status: DC | PRN

## 2017-03-20 MED ORDER — SODIUM CHLORIDE 0.9 % IV BOLUS (SEPSIS)
1000.0000 mL | Freq: Once | INTRAVENOUS | Status: AC
  Administered 2017-03-20: 1000 mL via INTRAVENOUS

## 2017-03-20 MED ORDER — INFLUENZA VAC SPLIT HIGH-DOSE 0.5 ML IM SUSY
0.5000 mL | PREFILLED_SYRINGE | INTRAMUSCULAR | Status: AC
Start: 2017-03-21 — End: 2017-03-27
  Administered 2017-03-27: 0.5 mL via INTRAMUSCULAR
  Filled 2017-03-20: qty 0.5

## 2017-03-20 MED ORDER — INSULIN ASPART 100 UNIT/ML ~~LOC~~ SOLN
0.0000 [IU] | Freq: Three times a day (TID) | SUBCUTANEOUS | Status: DC
  Administered 2017-03-20: 8 [IU] via SUBCUTANEOUS
  Administered 2017-03-20: 2 [IU] via SUBCUTANEOUS
  Administered 2017-03-21: 3 [IU] via SUBCUTANEOUS
  Administered 2017-03-21: 5 [IU] via SUBCUTANEOUS
  Administered 2017-03-22 (×2): 3 [IU] via SUBCUTANEOUS
  Administered 2017-03-22: 8 [IU] via SUBCUTANEOUS
  Administered 2017-03-23: 5 [IU] via SUBCUTANEOUS
  Administered 2017-03-23: 3 [IU] via SUBCUTANEOUS
  Administered 2017-03-23: 11 [IU] via SUBCUTANEOUS
  Administered 2017-03-24: 5 [IU] via SUBCUTANEOUS
  Administered 2017-03-24 (×2): 8 [IU] via SUBCUTANEOUS
  Administered 2017-03-25: 3 [IU] via SUBCUTANEOUS
  Administered 2017-03-25: 11 [IU] via SUBCUTANEOUS
  Administered 2017-03-25: 5 [IU] via SUBCUTANEOUS
  Administered 2017-03-26 (×2): 3 [IU] via SUBCUTANEOUS
  Administered 2017-03-26 – 2017-03-27 (×2): 5 [IU] via SUBCUTANEOUS
  Administered 2017-03-27: 8 [IU] via SUBCUTANEOUS
  Filled 2017-03-20: qty 1

## 2017-03-20 NOTE — ED Notes (Signed)
Intensivist at bedside.

## 2017-03-20 NOTE — ED Provider Notes (Signed)
[facility] DEPT Provider Note   CSN: [id number] Arrival date & time: 03/20/17  0258     History   Chief Complaint Chief Complaint  Patient presents with  . Chest Injury  . Loss of Consciousness    HPI Bryan Carney is a 76 y.o. male.   Loss of Consciousness   This is a recurrent problem. The current episode started yesterday. The problem has been gradually worsening. The problem is associated with sitting up. Pertinent negatives include chest pain, fever and vomiting. Associated symptoms comments: Chest wall pain .  patient presents with frequent syncopal episodes  He reports over past 24 hrs he has had bruising to right chest wall  - no trauma He was seen in the ED and it was felt to be due to coumadin causing spontaneous bleed Denies trauma Denies any injury from syncopal episodes He was monitored in the ED, he was improved and went home Since that time the bruising/pain in chest wall has worsened everytime he sits up he becomes lightheaded and has syncopal episode  At rest - he denies cp/sob No fever/vomiting He has not coumadin in past 24 hrs   Past Medical History:  Diagnosis Date  . CAD (coronary artery disease)   . Diabetes ([place])   . Hx pulmonary embolism   . Hyperlipidemia   . Hypertension   . Prostate cancer ([place])   . PVOD (pulmonary veno-occlusive disease) ([place])   . Squamous cell carcinoma     Patient Active Problem List   Diagnosis Date Noted  . Essential hypertension 07/07/2014  . Hyperlipidemia 07/07/2014  . Coronary artery disease 07/07/2014  . Carotid artery disease ([place]) 07/07/2014  . Diabetes ([place]) 07/07/2014  . Long term (current) use of anticoagulants 10/14/2012  . Acute pulmonary embolism ([place]) 10/14/2012    Past Surgical History:  Procedure Laterality Date  . BACK SURGERY    . CARDIAC CATHETERIZATION  10/15/1997   Recommend complete revascularization by CABG  . CARDIOVASCULAR STRESS TEST  06/12/2012   No evidence of ischemia    . CAROTID DOPPLER  08/27/2012   Rt bulb/proximal ICA demonstrated a mild-moderate amount of fibrous plaque without evidence of a significant diameter reduction or any other vascular abnormality.  . CAROTID ENDARTERECTOMY Left 02/23/1995  . CORONARY ARTERY BYPASS GRAFT  10/19/1997   x5. LIMA-LAD, SVG-PDA, SVG-OM1, seq SVG-fist and second branches of ramus intermedius.       Home Medications    Prior to Admission medications   Medication Sig Start Date End Date Taking? Authorizing Provider  amitriptyline (ELAVIL) 10 MG tablet Take 10 mg by mouth at bedtime.    [provider]  amLODipine (NORVASC) 5 MG tablet Take 5 mg by mouth at bedtime.     [provider]  aspirin 325 MG tablet Take 325 mg by mouth daily.    [provider]  benazepril (LOTENSIN) 40 MG tablet Take 40 mg by mouth daily.    [provider]  ferrous sulfate 325 (65 FE) MG tablet Take 325 mg by mouth daily with breakfast.     [provider]  HYDROcodone-acetaminophen (NORCO/VICODIN) 5-325 MG tablet Take 1 tablet by mouth every 6 (six) hours as needed. 03/19/17   Horton, Barbette Hair, MD  KOMBIGLYZE XR 10-998 MG TB24 Take 1 tablet by mouth daily.  09/26/15   [provider]  simvastatin (ZOCOR) 40 MG tablet Take 40 mg by mouth daily.    [provider]  warfarin (COUMADIN) 5 MG tablet Take  1.5 to 2 tablets by mouth daily as directed by coumadin clinic Patient taking differently: Take 7.5-10 mg by mouth See admin instructions. Take 10 mg on Thursday and Friday then take 7.5 mg all there other days 06/21/15   Lorretta Harp, MD    Family History Family History  Problem Relation Age of Onset  . Heart attack Father   . Cancer Sister   . Diabetes Brother   . Cancer Sister   . Diabetes Brother   . Diabetes Brother   . Diabetes Brother     Social History Social History  Substance Use Topics  . Smoking status: Never Smoker  . Smokeless tobacco: Never Used  .  Alcohol use No     Allergies   Lopressor [metoprolol tartrate]   Review of Systems Review of Systems  Constitutional: Negative for fever.  Cardiovascular: Positive for syncope. Negative for chest pain.  Gastrointestinal: Negative for blood in stool and vomiting.  Skin: Positive for color change.  Neurological: Positive for syncope.  All other systems reviewed and are negative.    Physical Exam Updated Vital Signs BP 129/64   Pulse 86   Temp 98.4 F (36.9 C) (Oral)   Resp 18   SpO2 96%   Physical Exam CONSTITUTIONAL: Elderly, ill appearing HEAD: Normocephalic/atraumatic EYES: EOMI/PERRL, conjunctiva pale ENMT: Mucous membranes moist NECK: supple no meningeal signs Spine - no cervical spine tenderness SPINE/BACK:entire spine nontender CV: no loud murmurs LUNGS: Lungs are clear to auscultation bilaterally, no apparent distress ABDOMEN: soft, nontender  NEURO: Pt is awake/alert/appropriate, moves all extremitiesx4.    EXTREMITIES: pulses normal/equal, full ROM SKIN: see photo, he is otherwise pale appearing PSYCH: no abnormalities of mood noted, alert and oriented to situation      Patient gave verbal permission to utilize photo for medical documentation only The image was not stored on any personal device   ED Treatments / Results  Labs (all labs ordered are listed, but only abnormal results are displayed) Labs Reviewed  BASIC METABOLIC PANEL - Abnormal; Notable for the following:       Result Value   Sodium 132 (*)    CO2 21 (*)    Glucose, Bld 340 (*)    Creatinine, Ser 1.52 (*)    Calcium 7.3 (*)    GFR calc non Af Amer 42 (*)    GFR calc Af Amer 49 (*)    All other components within normal limits  CBC - Abnormal; Notable for the following:    WBC 12.4 (*)    RBC 1.98 (*)    Hemoglobin 6.4 (*)    HCT 19.0 (*)    All other components within normal limits  PROTIME-INR - Abnormal; Notable for the following:    Prothrombin Time 47.1 (*)    All  other components within normal limits  CBG MONITORING, ED - Abnormal; Notable for the following:    Glucose-Capillary 311 (*)    All other components within normal limits  TYPE AND SCREEN  ABO/RH  PREPARE FRESH FROZEN PLASMA    EKG ED ECG REPORT   Date: 03/20/2017  Rate: 73  Rhythm: normal sinus rhythm  QRS Axis: normal  Intervals: normal  ST/T Wave abnormalities: nonspecific ST changes  Conduction Disutrbances:none  Narrative Interpretation: repolarization abnormalities  Old EKG Reviewed: unchanged  I have personally reviewed the EKG tracing and agree with the computerized printout as noted.  Radiology Dg Chest 2 View  Result Date: 03/18/2017 CLINICAL DATA:  Bruising and swelling  right upper and lateral chest. EXAM: CHEST  2 VIEW COMPARISON:  10/20/2015 FINDINGS: AP and lateral sitting views were obtained. The lungs are clear without focal pneumonia, edema, pneumothorax or pleural effusion. The cardiopericardial silhouette is within normal limits for size. Patient is status post CABG The visualized bony structures of the thorax are intact. IMPRESSION: No active cardiopulmonary disease. Electronically Signed   By: Misty Stanley M.D.   On: 03/18/2017 20:23   Ct Chest W Contrast  Result Date: 03/19/2017 CLINICAL DATA:  Chest wall pain and bruising, progressive. On anticoagulation. EXAM: CT CHEST WITH CONTRAST TECHNIQUE: Multidetector CT imaging of the chest was performed during intravenous contrast administration. CONTRAST:  71mL ISOVUE-300 IOPAMIDOL (ISOVUE-300) INJECTION 61% COMPARISON:  Radiograph yesterday. FINDINGS: Cardiovascular: Post CABG with atherosclerosis of native coronary arteries. No aortic dissection. Ascending thoracic aorta measures 3.4 cm with small atherosclerosis. Cannot assess for pulmonary embolus given phase of visualized thyroid gland is normal. No pericardial effusion. Contrast. Borderline heart size. Questionable right subclavian/axillary venous stenosis or  occlusion with collaterals in the right axilla. Mediastinum/Nodes: No adenopathy. The esophagus is decompressed. The visualized thyroid gland is normal. Lungs/Pleura: Mild breathing motion artifact at the lung bases. Mild dependent atelectasis. No consolidation, pulmonary edema or pleural fluid. Upper Abdomen: Diminished enhancement of the right kidney with occlusion of the right renal artery at its origin, some distal arterial reconstitution. Right renal atrophy seen on prior abdominal CT. Musculoskeletal: Heterogeneous enlargement of the right pectoralis muscle and poorly defined subpectoral soft tissues consistent with hematoma. No active extravasation. Soft tissue stranding extends inferiorly on the chest wall. No rib fracture or evidence of acute osseous abnormality. Degenerative change in the thoracic spine with vacuum phenomenon and endplate spurring. IMPRESSION: 1. Large right pectoralis/sub pectoralis chest wall hematoma. No active extravasation. 2. Dependent atelectasis in the lung bases, otherwise no acute intrathoracic abnormality. 3. Aortic atherosclerosis. Post CABG with native coronary artery calcifications. 4. Incidentally noted in the upper abdomen right renal atrophy and diminished enhancement. There is occlusion of the right renal artery at the origin, with some distal reconstitution of arterial flow. This is likely chronic as right renal atrophy was seen on prior noncontrast abdominal CT. 5. Probable right subclavian venous stenosis or occlusion with chest wall collaterals, suggesting this is chronic. Aortic Atherosclerosis (ICD10-I70.0). Electronically Signed   By: Jeb Levering M.D.   On: 03/19/2017 04:58    Procedures Procedures   CRITICAL CARE Performed by: Sharyon Cable Total critical care time: 40 minutes Critical care time was exclusive of separately billable procedures and treating other patients. Critical care was necessary to treat or prevent imminent or life-threatening  deterioration. Critical care was time spent personally by me on the following activities: development of treatment plan with patient and/or surrogate as well as nursing, discussions with consultants, evaluation of patient's response to treatment, examination of patient, obtaining history from patient or surrogate, ordering and performing treatments and interventions, ordering and review of laboratory studies, ordering and review of radiographic studies, pulse oximetry and re-evaluation of patient's condition. PATIENT WITH ACUTE BLOOD LOSS ANEMIA AND ALSO COAGULOPATHY REQUIRING IV VITAMIN K AND FFP  Medications Ordered in ED Medications  phytonadione (VITAMIN K) 10 mg in dextrose 5 % 50 mL IVPB (not administered)  sodium chloride 0.9 % bolus 1,000 mL (1,000 mLs Intravenous New Bag/Given 03/20/17 0412)     Initial Impression / Assessment and Plan / ED Course  I have reviewed the triage vital signs and the nursing notes.  Pertinent labs results that  were available during my care of the patient were reviewed by me and considered in my medical decision making (see chart for details).     4:12 AM Pt with recurrent spontaneous bleed in chest No new trauma CT chest from previous ED visit revealed no active extravasation I suspect he has continued to bleed and will likely need coumadin reversal and transfusion Currently he is awake/alert, BP appropriate Defer emergent imaging at this time Will monitor closely Pt without any objections to receiving blood products 4:52 AM PT WITH ACUTE WORSENING ANEMIA HE IS ALSO COAGULOPATHIC HOWEVER IN THE BED, HIS BP IS STABLE D/W PHARMACY THEY REQUEST TO GIVE VITAMIN K AND FFP THIS WAS ORDERED I HAVE SPOKEN TO DR Nelda Marseille WITH CRITICAL CARE I FEEL HE NEEDS RESUSCITATION AND COUMADIN REVERSAL PRIOR TO ANY FURTHER IMAGING  CRITICAL CARE WILL EVALUATE PATIENT FOR ADMIT   Final Clinical Impressions(s) / ED Diagnoses   Final diagnoses:  Coagulopathy ([place])    Acute blood loss anemia    New Prescriptions New Prescriptions   No medications on file     Ripley Fraise, MD 03/20/17 [phone]

## 2017-03-20 NOTE — Consult Note (Signed)
Name: Bryan Carney MRN: [id number] DOB: 12/26/1940    ADMISSION DATE:  03/20/2017 CONSULTATION DATE:  03/20/2017  REFERRING MD :  Dr. Christy Gentles   CHIEF COMPLAINT:  Spontaneous Chest Wall Hematoma   HISTORY OF PRESENT ILLNESS:   76 year old male with PMH of CAD, HLD, HTN, PVOD (on coumadin), and Squamous cell carcinoma  Presented to ED on 9/24 with upper chest hematoma extending into the lateral chest wall. Denied injury. CT Chest with large right pectoralis chest wall hematoma with no active extravasation. Hemoglobin stable, INR 3.06, given fluid due to orthostatic hypotension, patient offered observation stay however opted to go home. Came back to ED on 9/26 for complainants of syncope and increase in size of hematoma. Patient denies tingling/burning, numbness or paralysis. BP 135/50, HR 83. PCCM asked to consult.   SIGNIFICANT EVENTS  9/24 > Presents to ED with spontaneous hematoma  9/26 > Extending hematoma and syncope   STUDIES:  CT Chest 9/25 > 1. Large right pectoralis/sub pectoralis chest wall hematoma. No active extravasation. 2. Dependent atelectasis in the lung bases, otherwise no acute intrathoracic abnormality. 3. Aortic atherosclerosis. Post CABG with native coronary artery calcifications. 4. Incidentally noted in the upper abdomen right renal atrophy and diminished enhancement. There is occlusion of the right renal artery at the origin, with some distal reconstitution of arterial flow. This is likely chronic as right renal atrophy was seen on prior noncontrast abdominal CT. 5. Probable right subclavian venous stenosis or occlusion with chest wall collaterals, suggesting this is chronic.   PAST MEDICAL HISTORY :   has a past medical history of CAD (coronary artery disease); Diabetes ([place]); pulmonary embolism; Hyperlipidemia; Hypertension; Prostate cancer ([place]); PVOD (pulmonary veno-occlusive disease) ([place]); and Squamous cell carcinoma.  has a past surgical history  that includes CAROTID DOPPLER (08/27/2012); Cardiac catheterization (10/15/1997); Cardiovascular stress test (06/12/2012); Carotid endarterectomy (Left, 02/23/1995); Coronary artery bypass graft (10/19/1997); and Back surgery. Prior to Admission medications   Medication Sig Start Date End Date Taking? Authorizing Provider  amitriptyline (ELAVIL) 10 MG tablet Take 10 mg by mouth at bedtime.   Yes [provider]  amLODipine (NORVASC) 5 MG tablet Take 5 mg by mouth at bedtime.    Yes [provider]  aspirin 325 MG tablet Take 325 mg by mouth daily.   Yes [provider]  benazepril (LOTENSIN) 40 MG tablet Take 40 mg by mouth daily.   Yes [provider]  ferrous sulfate 325 (65 FE) MG tablet Take 325 mg by mouth daily with breakfast.    Yes [provider]  KOMBIGLYZE XR 10-998 MG TB24 Take 1 tablet by mouth daily.  09/26/15  Yes [provider]  simvastatin (ZOCOR) 40 MG tablet Take 40 mg by mouth daily.   Yes [provider]  warfarin (COUMADIN) 5 MG tablet Take 1.5 to 2 tablets by mouth daily as directed by coumadin clinic Patient taking differently: Take 7.5-10 mg by mouth See admin instructions. Take 10 mg on Thursday and Friday then take 7.5 mg all there other days 06/21/15  Yes Lorretta Harp, MD  HYDROcodone-acetaminophen (NORCO/VICODIN) 5-325 MG tablet Take 1 tablet by mouth every 6 (six) hours as needed. 03/19/17   Horton, Barbette Hair, MD   Allergies  Allergen Reactions  . Lopressor [Metoprolol Tartrate] Other (See Comments)    Severe BP drop    FAMILY HISTORY:  family history includes Cancer in his sister and sister; Diabetes in his brother, brother, brother, and brother; Heart  attack in his father. SOCIAL HISTORY:  reports that he has never smoked. He has never used smokeless tobacco. He reports that he does not drink alcohol.  REVIEW OF SYSTEMS:   All negative; except for those that are bolded, which indicate  positives.  Constitutional: weight loss, weight gain, night sweats, fevers, chills, fatigue, weakness.  HEENT: headaches, sore throat, sneezing, nasal congestion, post nasal drip, difficulty swallowing, tooth/dental problems, visual complaints, visual changes, ear aches. Neuro: difficulty with speech, weakness, numbness, ataxia. CV:  chest pain, orthopnea, PND, swelling in lower extremities, dizziness, palpitations, syncope.  Resp: cough, hemoptysis, dyspnea, wheezing. GI: heartburn, indigestion, abdominal pain, nausea, vomiting, diarrhea, constipation, change in bowel habits, loss of appetite, hematemesis, melena, hematochezia.  GU: dysuria, change in color of urine, urgency or frequency, flank pain, hematuria. MSK: Pain to right arm when moving, joint pain or swelling, decreased range of motion. Psych: change in mood or affect, depression, anxiety, suicidal ideations, homicidal ideations. Skin: rash, itching, bruising.  SUBJECTIVE:   VITAL SIGNS: Temp:  [98.4 F (36.9 C)-98.6 F (37 C)] 98.6 F (37 C) (09/26 0548) Pulse Rate:  [72-112] 83 (09/26 0548) Resp:  [13-20] 20 (09/26 0545) BP: (101-145)/(46-104) 128/48 (09/26 0545) SpO2:  [96 %-100 %] 97 % (09/26 0545)  PHYSICAL EXAMINATION: General:  Elderly male, no distress  Neuro:  Alert and oriented, follows commands  HEENT:  Normocephalic  Cardiovascular:  RRR, no MRG  Lungs:  Clear breath sounds, no wheeze/crackles  Abdomen:  Non-tender, active bowel sounds  Musculoskeletal:  -edema  Skin:  Bruising to chest wall extending up to shoulder    Recent Labs Lab 03/18/17 1933 03/20/17 0315  NA 137 132*  K 4.5 4.5  CL 108 105  CO2 24 21*  BUN 23* 20  CREATININE 1.59* 1.52*  GLUCOSE 197* 340*    Recent Labs Lab 03/18/17 1933 03/19/17 0621 03/20/17 0315  HGB 11.2* 9.5* 6.4*  HCT 34.4* 28.8* 19.0*  WBC 11.0*  --  12.4*  PLT 193  --  154   Dg Chest 2 View  Result Date: 03/18/2017 CLINICAL DATA:  Bruising and  swelling right upper and lateral chest. EXAM: CHEST  2 VIEW COMPARISON:  10/20/2015 FINDINGS: AP and lateral sitting views were obtained. The lungs are clear without focal pneumonia, edema, pneumothorax or pleural effusion. The cardiopericardial silhouette is within normal limits for size. Patient is status post CABG The visualized bony structures of the thorax are intact. IMPRESSION: No active cardiopulmonary disease. Electronically Signed   By: Misty Stanley M.D.   On: 03/18/2017 20:23   Ct Chest W Contrast  Result Date: 03/19/2017 CLINICAL DATA:  Chest wall pain and bruising, progressive. On anticoagulation. EXAM: CT CHEST WITH CONTRAST TECHNIQUE: Multidetector CT imaging of the chest was performed during intravenous contrast administration. CONTRAST:  37mL ISOVUE-300 IOPAMIDOL (ISOVUE-300) INJECTION 61% COMPARISON:  Radiograph yesterday. FINDINGS: Cardiovascular: Post CABG with atherosclerosis of native coronary arteries. No aortic dissection. Ascending thoracic aorta measures 3.4 cm with small atherosclerosis. Cannot assess for pulmonary embolus given phase of visualized thyroid gland is normal. No pericardial effusion. Contrast. Borderline heart size. Questionable right subclavian/axillary venous stenosis or occlusion with collaterals in the right axilla. Mediastinum/Nodes: No adenopathy. The esophagus is decompressed. The visualized thyroid gland is normal. Lungs/Pleura: Mild breathing motion artifact at the lung bases. Mild dependent atelectasis. No consolidation, pulmonary edema or pleural fluid. Upper Abdomen: Diminished enhancement of the right kidney with occlusion of the right renal artery at its origin, some distal arterial reconstitution. Right  renal atrophy seen on prior abdominal CT. Musculoskeletal: Heterogeneous enlargement of the right pectoralis muscle and poorly defined subpectoral soft tissues consistent with hematoma. No active extravasation. Soft tissue stranding extends inferiorly on  the chest wall. No rib fracture or evidence of acute osseous abnormality. Degenerative change in the thoracic spine with vacuum phenomenon and endplate spurring. IMPRESSION: 1. Large right pectoralis/sub pectoralis chest wall hematoma. No active extravasation. 2. Dependent atelectasis in the lung bases, otherwise no acute intrathoracic abnormality. 3. Aortic atherosclerosis. Post CABG with native coronary artery calcifications. 4. Incidentally noted in the upper abdomen right renal atrophy and diminished enhancement. There is occlusion of the right renal artery at the origin, with some distal reconstitution of arterial flow. This is likely chronic as right renal atrophy was seen on prior noncontrast abdominal CT. 5. Probable right subclavian venous stenosis or occlusion with chest wall collaterals, suggesting this is chronic. Aortic Atherosclerosis (ICD10-I70.0). Electronically Signed   By: Jeb Levering M.D.   On: 03/19/2017 04:58    ASSESSMENT / PLAN:  Spontaneous Hematoma to Right Chest Wall  Plan  -Monitor  -Surgery Consulted   Blood Loss Anemia  Multiple episodes of syncope  Hemoglobin 6.4 >>  Plan  -Trend BMP  -Transfuse 1 unit RBC now   Supra-therapeutic INR INR 4.82 >>   Plan  -Continue to hold anticoagulation  -Administer FFP and Vitamin K  -Trend INR   Patient okay to transfer to step-down for close monitoring with remaining management per primary team.    Hayden Pedro, AGACNP-BC [place]  Pgr: [phone]  PCCM Pgr: [phone]

## 2017-03-20 NOTE — ED Notes (Signed)
Recheck CBC and PT/INR morning of 03/21/17 per Dr Marin Comment

## 2017-03-20 NOTE — ED Notes (Signed)
ED Provider at bedside. 

## 2017-03-20 NOTE — H&P (Signed)
History and Physical  Patient Name: Bryan Carney     KVQ:[id number]    DOB: December 15, 1940    DOA: 03/20/2017 PCP: [name], NP  Patient coming from: Home  Chief Complaint: Chest swelling      HPI: Bryan Carney is a 76 y.o. male with a past medical history significant for hx of PE on warfarin, CAD/PVD on aspirin, NIDDM, and HTN who presents with spontaneous chest wall hematoma and now syncope.  The patient was in his usual state of health until Monday evening, he was in the shower, he noticed some soreness in his right breast, looked down there is a bruise. He came to the emergency room, where he was found to have a large chest wall hematoma on CT scan. Of note during the time he was in the emergency room his hemoglobin dropped 2 g, and he had a near syncopal spell. He was offered observation of the hospital, but felt stable for discharge and went home. However, on returning home the patient became weaker and weaker, more and more pale, his hematoma/swelling actually enlarged significantly, and he would syncopize with any movement. This happened throughout the afternoon, so finally family brought him back to the emergency room.  ED course: -Afebrile, heart rate 75, respirations pulse ox normal, blood pressure 121/46 while laying -Reportedly orthostatic to the 64P systolic with EMS -Na 329, K 4.5, Cr 1.5 (baseline unknown), WBC 12.4K, Hgb 6.4 -INR increased to 4.8) from 3 yesterday -The case was discussed with CCM, who recommended IV fluids, vitamin K, and FFP as well as PRBC transfusion -The case was discussed with General surgery who recommended same as above, no needed surgical intervention   He has no previous history of bleeding or hematoma other than minor skin bruises.  No history of GI bleed.  Family think his INR is only rarely out of range.    ROS: Review of Systems  Cardiovascular: Positive for chest pain.  Gastrointestinal: Negative for blood in stool and melena.    Neurological: Positive for dizziness, loss of consciousness and weakness.  All other systems reviewed and are negative.         Past Medical History:  Diagnosis Date  . CAD (coronary artery disease)   . Diabetes ([place])   . Hx pulmonary embolism   . Hyperlipidemia   . Hypertension   . Prostate cancer ([place])   . PVOD (pulmonary veno-occlusive disease) ([place])   . Squamous cell carcinoma     Past Surgical History:  Procedure Laterality Date  . BACK SURGERY    . CARDIAC CATHETERIZATION  10/15/1997   Recommend complete revascularization by CABG  . CARDIOVASCULAR STRESS TEST  06/12/2012   No evidence of ischemia  . CAROTID DOPPLER  08/27/2012   Rt bulb/proximal ICA demonstrated a mild-moderate amount of fibrous plaque without evidence of a significant diameter reduction or any other vascular abnormality.  . CAROTID ENDARTERECTOMY Left 02/23/1995  . CORONARY ARTERY BYPASS GRAFT  10/19/1997   x5. LIMA-LAD, SVG-PDA, SVG-OM1, seq SVG-fist and second branches of ramus intermedius.    Social History: Patient lives with his wife.  The patient walks unassisted.  He still drives and works >51 hrs per week.  Nonsmoker.  From [place] originally.  Is in car sales.  Allergies  Allergen Reactions  . Lopressor [Metoprolol Tartrate] Other (See Comments)    Severe BP drop    Family history: family history includes Cancer in his sister and sister; Diabetes in his brother, brother, brother,  and brother; Heart attack in his father.  Prior to Admission medications   Medication Sig Start Date End Date Taking? Authorizing Provider  amitriptyline (ELAVIL) 10 MG tablet Take 10 mg by mouth at bedtime.   Yes [provider]  amLODipine (NORVASC) 5 MG tablet Take 5 mg by mouth at bedtime.    Yes [provider]  aspirin 325 MG tablet Take 325 mg by mouth daily.   Yes [provider]  benazepril (LOTENSIN) 40 MG tablet Take 40 mg by mouth daily.   Yes [provider]   ferrous sulfate 325 (65 FE) MG tablet Take 325 mg by mouth daily with breakfast.    Yes [provider]  KOMBIGLYZE XR 10-998 MG TB24 Take 1 tablet by mouth daily.  09/26/15  Yes [provider]  simvastatin (ZOCOR) 40 MG tablet Take 40 mg by mouth daily.   Yes [provider]  warfarin (COUMADIN) 5 MG tablet Take 1.5 to 2 tablets by mouth daily as directed by coumadin clinic Patient taking differently: Take 7.5-10 mg by mouth See admin instructions. Take 10 mg on Thursday and Friday then take 7.5 mg all there other days 06/21/15  Yes Lorretta Harp, MD  HYDROcodone-acetaminophen (NORCO/VICODIN) 5-325 MG tablet Take 1 tablet by mouth every 6 (six) hours as needed. 03/19/17   Merryl Hacker, MD       Physical Exam: BP (!) 135/50   Pulse 83   Temp 98.5 F (36.9 C) (Oral)   Resp 18   SpO2 97%  General appearance: Thin elderly adult male, alert but wan and listless.   Eyes: Anicteric, conjunctiva chemotic, lids and lashes normal. PERRL.    ENT: No nasal deformity, discharge, epistaxis.  Hearing diminihsed. OP moist without lesions.   Neck: No neck masses.  Trachea midline.  No thyromegaly/tenderness. Lymph: No cervical or supraclavicular lymphadenopathy. Skin: Warm and dry. Very pale.  Bruising extensive. No jaundice.  No suspicious rashes or lesions. Cardiac: Tachycardic, regular, nl S1-S2, flow murmur noted.  Capillary refill is brisk.  JVP not visible.  No LE edema.  Radial and DP pulses 2+ and symmetric.   Respiratory: Normal respiratory rate and rhythm.  CTAB without rales or wheezes. Abdomen: Abdomen soft.  No TTP. No ascites, distension, hepatosplenomegaly.   MSK: No deformities or effusions.  No cyanosis or clubbing.  Very very large tense painful right chest wall swelling, extending now down to the lower ribs, up to the neck. Neuro: Cranial nerves normal. Speech is fluent.  Muscle strength 4/5 but symmetric.    Psych: Sensorium intact and responding  to questions, attention normal.  Behavior appropriate.  Affect blunted.  Judgment and insight appear normal.     Labs on Admission:  I have personally reviewed following labs and imaging studies: CBC:  Recent Labs Lab 03/18/17 1933 03/19/17 0621 03/20/17 0315  WBC 11.0*  --  12.4*  HGB 11.2* 9.5* 6.4*  HCT 34.4* 28.8* 19.0*  MCV 97.5  --  96.0  PLT 193  --  606   Basic Metabolic Panel:  Recent Labs Lab 03/18/17 1933 03/20/17 0315  NA 137 132*  K 4.5 4.5  CL 108 105  CO2 24 21*  GLUCOSE 197* 340*  BUN 23* 20  CREATININE 1.59* 1.52*  CALCIUM 8.8* 7.3*   GFR: Estimated Creatinine Clearance: 31.8 mL/min (A) (by C-G formula based on SCr of 1.52 mg/dL (H)).  Liver Function Tests:  Recent Labs Lab 03/18/17 1933  AST 21  ALT 13*  ALKPHOS 68  BILITOT 1.5*  PROT 5.9*  ALBUMIN 4.0   No results for input(s): LIPASE, AMYLASE in the last 168 hours. No results for input(s): AMMONIA in the last 168 hours. Coagulation Profile:  Recent Labs Lab 03/18/17 1933 03/20/17 0331  INR 3.06 4.82*   Cardiac Enzymes: No results for input(s): CKTOTAL, CKMB, CKMBINDEX, TROPONINI in the last 168 hours. BNP (last 3 results) No results for input(s): PROBNP in the last 8760 hours. HbA1C: No results for input(s): HGBA1C in the last 72 hours. CBG:  Recent Labs Lab 03/19/17 0612 03/20/17 0319  GLUCAP 156* 311*   Lipid Profile: No results for input(s): CHOL, HDL, LDLCALC, TRIG, CHOLHDL, LDLDIRECT in the last 72 hours. Thyroid Function Tests: No results for input(s): TSH, T4TOTAL, FREET4, T3FREE, THYROIDAB in the last 72 hours. Anemia Panel: No results for input(s): VITAMINB12, FOLATE, FERRITIN, TIBC, IRON, RETICCTPCT in the last 72 hours. Sepsis Labs: Invalid input(s): PROCALCITONIN, LACTICIDVEN No results found for this or any previous visit (from the past 240 hour(s)).       Radiological Exams on Admission: Personally reviewed CT report from yesterday: Dg Chest 2  View  Result Date: 03/18/2017 CLINICAL DATA:  Bruising and swelling right upper and lateral chest. EXAM: CHEST  2 VIEW COMPARISON:  10/20/2015 FINDINGS: AP and lateral sitting views were obtained. The lungs are clear without focal pneumonia, edema, pneumothorax or pleural effusion. The cardiopericardial silhouette is within normal limits for size. Patient is status post CABG The visualized bony structures of the thorax are intact. IMPRESSION: No active cardiopulmonary disease. Electronically Signed   By: Misty Stanley M.D.   On: 03/18/2017 20:23   Ct Chest W Contrast  Result Date: 03/19/2017 CLINICAL DATA:  Chest wall pain and bruising, progressive. On anticoagulation. EXAM: CT CHEST WITH CONTRAST TECHNIQUE: Multidetector CT imaging of the chest was performed during intravenous contrast administration. CONTRAST:  64mL ISOVUE-300 IOPAMIDOL (ISOVUE-300) INJECTION 61% COMPARISON:  Radiograph yesterday. FINDINGS: Cardiovascular: Post CABG with atherosclerosis of native coronary arteries. No aortic dissection. Ascending thoracic aorta measures 3.4 cm with small atherosclerosis. Cannot assess for pulmonary embolus given phase of visualized thyroid gland is normal. No pericardial effusion. Contrast. Borderline heart size. Questionable right subclavian/axillary venous stenosis or occlusion with collaterals in the right axilla. Mediastinum/Nodes: No adenopathy. The esophagus is decompressed. The visualized thyroid gland is normal. Lungs/Pleura: Mild breathing motion artifact at the lung bases. Mild dependent atelectasis. No consolidation, pulmonary edema or pleural fluid. Upper Abdomen: Diminished enhancement of the right kidney with occlusion of the right renal artery at its origin, some distal arterial reconstitution. Right renal atrophy seen on prior abdominal CT. Musculoskeletal: Heterogeneous enlargement of the right pectoralis muscle and poorly defined subpectoral soft tissues consistent with hematoma. No active  extravasation. Soft tissue stranding extends inferiorly on the chest wall. No rib fracture or evidence of acute osseous abnormality. Degenerative change in the thoracic spine with vacuum phenomenon and endplate spurring. IMPRESSION: 1. Large right pectoralis/sub pectoralis chest wall hematoma. No active extravasation. 2. Dependent atelectasis in the lung bases, otherwise no acute intrathoracic abnormality. 3. Aortic atherosclerosis. Post CABG with native coronary artery calcifications. 4. Incidentally noted in the upper abdomen right renal atrophy and diminished enhancement. There is occlusion of the right renal artery at the origin, with some distal reconstitution of arterial flow. This is likely chronic as right renal atrophy was seen on prior noncontrast abdominal CT. 5. Probable right subclavian venous stenosis or occlusion with chest wall collaterals, suggesting this is chronic.  Aortic Atherosclerosis (ICD10-I70.0). Electronically Signed   By: Jeb Levering M.D.   On: 03/19/2017 04:58    EKG: Independently reviewed. Rate 73, QTc 426.   No ST changes.  Echocardiogram 2017: Report reviewed EF 55% Mild LVH Grade 2 DD         Assessment/Plan  1. Acute blood loss anemia:  From chest wall hematoma.   -Transfuse 1 unit PRBCs -Post transfusion CBC then trend overnight -Continue iron   2. Chest wall hematoma:  Discussed with radiology, only small vessels in superficial chest there, none likely to be imaging, further imaging unlikely to be clinically meaningful delineating source.  On exam, hematoma is clearly larger. -Hold warfarin, aspirin -Repeat INR after transfusion, vit K, repeat vit K as necessary  3. Hyponatremia:  -IVF and trend  4. History of pulmonary embolism:  Family recall that he has only had one PE in the past. -Hold warfarin  5. Diabetes:  -Hold orals -SSI with meals  6. Hypertension and PVD/CAD secondary prevention:  -Hold amlodipine and benazepril until  hemodynamics clearer -Hold aspirin -Continue statin       DVT prophylaxis: SCDs  Code Status: FULL  Family Communication: Daughter and wife at bedside  Disposition Plan: Anticipate IV fluids, blood products and monitor.   Consults called: CCM, Gen Surg Admission status: INPATIENT   Medical decision making: Patient seen at 6:05 AM on 03/20/2017.  The patient was discussed with Dr. Christy Gentles.  What exists of the patient's chart was reviewed in depth and summarized above.  Clinical condition: syncopizing with any movement.        Edwin Dada [facility] Pager [phone]        At the time of admission, it appears that the appropriate admission status for this patient is INPATIENT. This is judged to be reasonable and necessary in order to provide the required intensity of service to ensure the patient's safety given the presenting symptoms, physical exam findings, and initial radiographic and laboratory data in the context of their chronic comorbidities.  Together, these circumstances are felt to place him at high risk for further clinical deterioration threatening life, limb, or organ.   Patient requires inpatient status due to high intensity of service, high risk for further deterioration and high frequency of surveillance required because of this acute illness that poses a threat to life, limb or bodily function.  I certify that at the point of admission it is my clinical judgment that the patient will require inpatient hospital care spanning beyond 2 midnights from the point of admission and that early discharge would result in unnecessary risk of decompensation and readmission or threat to life, limb or bodily function.

## 2017-03-20 NOTE — ED Notes (Signed)
Notified md of critical 6.4 Hemoglobin and PTINR 47.1

## 2017-03-20 NOTE — ED Provider Notes (Signed)
Seen by critical care due to acute anemia/coagulopathy He has been deemed appropriate for stepdown Also d/w general surgery,but they report to call them only if he does not respond to resuscitation/coumadin reversal  D/w dr danford for admission    Ripley Fraise, MD 03/20/17 [phone]

## 2017-03-20 NOTE — ED Notes (Signed)
Dr. Danford at bedside  

## 2017-03-20 NOTE — Progress Notes (Signed)
FOLLOW UP NOTE FROM EARLIER ADMISSION TODAY:  76 yo with CAD s/p CABG, HTN, DM, hx of prior CVA on coumadin for PE many years ago after anticoagulation, admitted for large spontaneous hematoma on the right chest wall.  He was given FFP and Vit K IV along with planned for 1 unit of PRBC. Exam showed irregular beats, large hematoma.  Bilateral BS. Alert, orient.  VSS.  Plan to proceed with IV transfusion.  Admit for OBS.  Recheck H and H in the am. Off Coumadin for at least 2 weeks, follow up with Dr [name] to decide longterm anticoagulation. Not sure if he ever had afib.   Orvan Falconer MD FACP. Hospitalist.

## 2017-03-20 NOTE — ED Notes (Signed)
Pt has some swelling to conjunctive of the eye; Dr.Wickline assessed and ok'd; RN to continue to monitor;Dr.Danford at bedside as well during assessment

## 2017-03-20 NOTE — ED Triage Notes (Signed)
Per EMS pt was seen in ED on Tuesday morning for hematoma to right chest; Pt is currently on warfarin; Pt as d/c'd home and told not to take medications due to contrast given during ED visit; Pt is diabetic and has not had Metformin or Warfarin since Tuesday morning; Pt had syncopal episode prior to Ed d/c yesterday but was kept for observation for 2 hours and then let go; Pt arrived back to Ed via EMS due to hematoma to chest now  Has increased up to neck; pt has has not been able to sit up w/o syncopal episode since 0430pm  Yesterday; Pt is a&ox 4 on arrival. Pt denies pain while still but c/o some pain on movement; EMS states pt BP while lying was 107/40 and dropped to 70/40 when sat up resulting in LOC; Pt has had 1L fluid  en route; Pt CBG en route was [id number],RN

## 2017-03-21 ENCOUNTER — Encounter (HOSPITAL_COMMUNITY): Payer: Self-pay | Admitting: General Surgery

## 2017-03-21 ENCOUNTER — Other Ambulatory Visit: Payer: Self-pay

## 2017-03-21 DIAGNOSIS — D689 Coagulation defect, unspecified: Secondary | ICD-10-CM

## 2017-03-21 LAB — GLUCOSE, CAPILLARY
GLUCOSE-CAPILLARY: 230 mg/dL — AB (ref 65–99)
GLUCOSE-CAPILLARY: 223 mg/dL — AB (ref 65–99)
GLUCOSE-CAPILLARY: 128 mg/dL — AB (ref 65–99)
Glucose-Capillary: 161 mg/dL — ABNORMAL HIGH (ref 65–99)

## 2017-03-21 LAB — CBC
HCT: 17.8 % — ABNORMAL LOW (ref 39.0–52.0)
HCT: 22.3 % — ABNORMAL LOW (ref 39.0–52.0)
HEMATOCRIT: 22.5 % — AB (ref 39.0–52.0)
HEMOGLOBIN: 6 g/dL — AB (ref 13.0–17.0)
HEMOGLOBIN: 7.6 g/dL — AB (ref 13.0–17.0)
Hemoglobin: 7.7 g/dL — ABNORMAL LOW (ref 13.0–17.0)
MCH: 30.9 pg (ref 26.0–34.0)
MCH: 30.9 pg (ref 26.0–34.0)
MCH: 30.9 pg (ref 26.0–34.0)
MCHC: 33.7 g/dL (ref 30.0–36.0)
MCHC: 34.2 g/dL (ref 30.0–36.0)
MCHC: 34.1 g/dL (ref 30.0–36.0)
MCV: 91.8 fL (ref 78.0–100.0)
MCV: 90.4 fL (ref 78.0–100.0)
MCV: 90.7 fL (ref 78.0–100.0)
PLATELETS: 112 10*3/uL — AB (ref 150–400)
PLATELETS: 123 10*3/uL — AB (ref 150–400)
Platelets: 131 10*3/uL — ABNORMAL LOW (ref 150–400)
RBC: 1.94 MIL/uL — ABNORMAL LOW (ref 4.22–5.81)
RBC: 2.49 MIL/uL — ABNORMAL LOW (ref 4.22–5.81)
RBC: 2.46 MIL/uL — ABNORMAL LOW (ref 4.22–5.81)
RDW: 14.7 % (ref 11.5–15.5)
RDW: 15.1 % (ref 11.5–15.5)
RDW: 14.8 % (ref 11.5–15.5)
WBC: 11 10*3/uL — ABNORMAL HIGH (ref 4.0–10.5)
WBC: 10.8 10*3/uL — ABNORMAL HIGH (ref 4.0–10.5)
WBC: 10.5 10*3/uL (ref 4.0–10.5)

## 2017-03-21 LAB — PREPARE FRESH FROZEN PLASMA
UNIT DIVISION: 0
UNIT DIVISION: 0

## 2017-03-21 LAB — PROTIME-INR
INR: 1.21
PROTHROMBIN TIME: 15.2 s (ref 11.4–15.2)

## 2017-03-21 LAB — BASIC METABOLIC PANEL
ANION GAP: 6 (ref 5–15)
BUN: 14 mg/dL (ref 6–20)
CALCIUM: 7.8 mg/dL — AB (ref 8.9–10.3)
CO2: 24 mmol/L (ref 22–32)
CREATININE: 1.26 mg/dL — AB (ref 0.61–1.24)
Chloride: 104 mmol/L (ref 101–111)
GFR calc Af Amer: >60 mL/min (ref >60)
GFR, EST NON AFRICAN AMERICAN: 53 mL/min — AB (ref >60)
Glucose, Bld: 200 mg/dL — ABNORMAL HIGH (ref 65–99)
Potassium: 3.9 mmol/L (ref 3.5–5.1)
Sodium: 134 mmol/L — ABNORMAL LOW (ref 135–145)

## 2017-03-21 LAB — URINALYSIS, ROUTINE W REFLEX MICROSCOPIC
Bilirubin Urine: NEGATIVE
GLUCOSE, UA: NEGATIVE mg/dL
KETONES UR: NEGATIVE mg/dL
Leukocytes, UA: NEGATIVE
NITRITE: NEGATIVE
PH: 6 (ref 5.0–8.0)
PROTEIN: 30 mg/dL — AB
Specific Gravity, Urine: 1.009 (ref 1.005–1.030)
Squamous Epithelial / LPF: NONE SEEN

## 2017-03-21 LAB — PREPARE RBC (CROSSMATCH)

## 2017-03-21 LAB — BPAM FFP
BLOOD PRODUCT EXPIRATION DATE: 201810012359
Blood Product Expiration Date: 201810012359
ISSUE DATE / TIME: 201809260523
UNIT TYPE AND RH: 5100
UNIT TYPE AND RH: 5100

## 2017-03-21 MED ORDER — AMITRIPTYLINE HCL 10 MG PO TABS
10.0000 mg | ORAL_TABLET | Freq: Every day | ORAL | Status: DC
  Administered 2017-03-21 – 2017-03-26 (×4): 10 mg via ORAL
  Filled 2017-03-21 (×6): qty 1

## 2017-03-21 MED ORDER — INSULIN GLARGINE 100 UNIT/ML ~~LOC~~ SOLN
6.0000 [IU] | Freq: Every day | SUBCUTANEOUS | Status: DC
  Administered 2017-03-21 – 2017-03-27 (×7): 6 [IU] via SUBCUTANEOUS
  Filled 2017-03-21 (×7): qty 0.06

## 2017-03-21 MED ORDER — INSULIN GLARGINE 100 UNIT/ML ~~LOC~~ SOLN: 6.0000 [IU] | Freq: Every day | SUBCUTANEOUS | Status: DC

## 2017-03-21 MED ORDER — AMLODIPINE BESYLATE 5 MG PO TABS
5.0000 mg | ORAL_TABLET | Freq: Every day | ORAL | Status: DC
  Administered 2017-03-21 – 2017-03-22 (×2): 5 mg via ORAL
  Filled 2017-03-21 (×2): qty 1

## 2017-03-21 MED ORDER — DILTIAZEM HCL 25 MG/5ML IV SOLN
10.0000 mg | Freq: Once | INTRAVENOUS | Status: AC
  Administered 2017-03-21: 10 mg via INTRAVENOUS
  Filled 2017-03-21: qty 5

## 2017-03-21 MED ORDER — SODIUM CHLORIDE 0.9 % IV SOLN
Freq: Once | INTRAVENOUS | Status: AC
  Administered 2017-03-21: 04:00:00 via INTRAVENOUS

## 2017-03-21 MED ORDER — BENAZEPRIL HCL 20 MG PO TABS
40.0000 mg | ORAL_TABLET | Freq: Every day | ORAL | Status: DC
  Administered 2017-03-21 – 2017-03-27 (×7): 40 mg via ORAL
  Filled 2017-03-21 (×7): qty 2

## 2017-03-21 MED ORDER — ATORVASTATIN CALCIUM 20 MG PO TABS
20.0000 mg | ORAL_TABLET | Freq: Every day | ORAL | Status: DC
  Administered 2017-03-23 – 2017-03-26 (×4): 20 mg via ORAL
  Filled 2017-03-21 (×4): qty 1

## 2017-03-21 NOTE — Consult Note (Signed)
Reason for Consult: spontaneous chest wall bleed Referring Physician: D Rayjon Wery is an 76 y.o. male.  HPI: Pt on coumadin for PE,  presented to the ED on 9/24 with chest wall ecchymosis/bruising.  BP was normal H/H was 11.2/34.  INR was 3.06, creatinine was 1.59.  CXR was normal.  CT scan later in the early AM 9/25 shows:  Large right pectoralis/sub pectoralis chest wall hematoma. No active extravasation.   Dependent atelectasis in the lung bases, otherwise no acute intrathoracic abnormality.  Large right pectoralis/sub pectoralis chest wall hematoma. No active extravasation. Dependent atelectasis in the lung bases, otherwise no acute intrathoracic abnormality.  They attempted to stand him up and ACE wrap the chest to help with compression, and he became obtunded and diaphoretic. BP down to 77/42.  He was sent home and returned around 3 AM yesterday with increased ecchymosis and brusing, he cannot sit up without becoming syncopal.  BP 70/40 by EMS and he was given fluid enroute to the ED. H/H down to 6.4/19.  INR up to 4.82, glucose up to 340.  He was admitted by Medicine, he was transfused and coumadin reversed.  He has had 2 units of RBC's, along with FFP.Currently BP is stable he is afebrile,163/68, HR 90, RR 21. Creatinine is stable, H/H is 6.0/17.8 platelets are 112K, WBC 11.0   INR is down to 1.21.  We are ask to see.    Past Medical History:  Diagnosis Date  . CAD (coronary artery disease)   . Diabetes ([place])   . Hx pulmonary embolism   . Hyperlipidemia   . Hypertension   . Prostate cancer ([place])   . PVOD (pulmonary veno-occlusive disease) ([place])   . Squamous cell carcinoma     Past Surgical History:  Procedure Laterality Date  . BACK SURGERY    . CARDIAC CATHETERIZATION  10/15/1997   Recommend complete revascularization by CABG  . CARDIOVASCULAR STRESS TEST  06/12/2012   No evidence of ischemia  . CAROTID DOPPLER  08/27/2012   Rt bulb/proximal ICA demonstrated a  mild-moderate amount of fibrous plaque without evidence of a significant diameter reduction or any other vascular abnormality.  . CAROTID ENDARTERECTOMY Left 02/23/1995  . CORONARY ARTERY BYPASS GRAFT  10/19/1997   x5. LIMA-LAD, SVG-PDA, SVG-OM1, seq SVG-fist and second branches of ramus intermedius.    Family History  Problem Relation Age of Onset  . Heart attack Father   . Cancer Sister   . Diabetes Brother   . Cancer Sister   . Diabetes Brother   . Diabetes Brother   . Diabetes Brother     Social History:  reports that he has never smoked. He has never used smokeless tobacco. He reports that he does not drink alcohol. His drug history is not on file.  Allergies:  Allergies  Allergen Reactions  . Lopressor [Metoprolol Tartrate] Other (See Comments)    Severe BP drop    Medications:  Prior to Admission:  Prescriptions Prior to Admission  Medication Sig Dispense Refill Last Dose  . amitriptyline (ELAVIL) 10 MG tablet Take 10 mg by mouth at bedtime.   03/17/2017 at Unknown time  . amLODipine (NORVASC) 5 MG tablet Take 5 mg by mouth at bedtime.    03/17/2017 at Unknown time  . aspirin 325 MG tablet Take 325 mg by mouth daily.   03/18/2017 at Unknown time  . benazepril (LOTENSIN) 40 MG tablet Take 40 mg by mouth daily.   03/18/2017 at Unknown time  .  ferrous sulfate 325 (65 FE) MG tablet Take 325 mg by mouth daily with breakfast.    03/18/2017 at Unknown time  . KOMBIGLYZE XR 10-998 MG TB24 Take 1 tablet by mouth daily.   0 03/18/2017 at Unknown time  . simvastatin (ZOCOR) 40 MG tablet Take 40 mg by mouth daily.   03/18/2017 at Unknown time  . warfarin (COUMADIN) 5 MG tablet Take 1.5 to 2 tablets by mouth daily as directed by coumadin clinic (Patient taking differently: Take 7.5-10 mg by mouth See admin instructions. Take 10 mg on Thursday and Friday then take 7.5 mg all there other days) 60 tablet 2 03/18/2017 at 1800  . HYDROcodone-acetaminophen (NORCO/VICODIN) 5-325 MG tablet Take 1  tablet by mouth every 6 (six) hours as needed. 6 tablet 0    Continuous:  Anti-infectives    None      Results for orders placed or performed during the hospital encounter of 03/20/17 (from the past 48 hour(s))  Basic metabolic panel     Status: Abnormal   Collection Time: 03/20/17  3:15 AM  Result Value Ref Range   Sodium 132 (L) 135 - 145 mmol/L   Potassium 4.5 3.5 - 5.1 mmol/L   Chloride 105 101 - 111 mmol/L   CO2 21 (L) 22 - 32 mmol/L   Glucose, Bld 340 (H) 65 - 99 mg/dL   BUN 20 6 - 20 mg/dL   Creatinine, Ser 1.52 (H) 0.61 - 1.24 mg/dL   Calcium 7.3 (L) 8.9 - 10.3 mg/dL   GFR calc non Af Amer 42 (L) >60 mL/min   GFR calc Af Amer 49 (L) >60 mL/min    Comment: (NOTE) The eGFR has been calculated using the CKD EPI equation. This calculation has not been validated in all clinical situations. eGFR's persistently <60 mL/min signify possible Chronic Kidney Disease.    Anion gap 6 5 - 15  CBC     Status: Abnormal   Collection Time: 03/20/17  3:15 AM  Result Value Ref Range   WBC 12.4 (H) 4.0 - 10.5 K/uL   RBC 1.98 (L) 4.22 - 5.81 MIL/uL   Hemoglobin 6.4 (LL) 13.0 - 17.0 g/dL    Comment: REPEATED TO VERIFY DELTA CHECK NOTED SPECIMEN CHECKED FOR CLOTS CRITICAL RESULT CALLED TO, READ BACK BY AND VERIFIED WITH: M. DAVIS,RN [id number] 03/20/2017 T. TYSOR    HCT 19.0 (L) 39.0 - 52.0 %   MCV 96.0 78.0 - 100.0 fL   MCH 32.3 26.0 - 34.0 pg   MCHC 33.7 30.0 - 36.0 g/dL   RDW 12.6 11.5 - 15.5 %   Platelets 154 150 - 400 K/uL  CBG monitoring, ED     Status: Abnormal   Collection Time: 03/20/17  3:19 AM  Result Value Ref Range   Glucose-Capillary 311 (H) 65 - 99 mg/dL   Comment 1 Notify RN    Comment 2 Document in Chart   Protime-INR     Status: Abnormal   Collection Time: 03/20/17  3:31 AM  Result Value Ref Range   Prothrombin Time 47.1 (H) 11.4 - 15.2 seconds   INR 4.82 (HH)     Comment: REPEATED TO VERIFY CRITICAL RESULT CALLED TO, READ BACK BY AND VERIFIED WITH: M. DAVIS,RN  0521 03/20/2017 T. TYSOR   Type and screen     Status: None (Preliminary result)   Collection Time: 03/20/17  3:59 AM  Result Value Ref Range   ABO/RH(D) O NEG    Antibody Screen NEG  Sample Expiration 03/23/2017    Unit Number [id number]    Blood Component Type RCLI PHER 2    Unit division 00    Status of Unit ISSUED,FINAL    Transfusion Status OK TO TRANSFUSE    Crossmatch Result Compatible    Unit Number [id number]    Blood Component Type RED CELLS,LR    Unit division 00    Status of Unit ALLOCATED    Transfusion Status OK TO TRANSFUSE    Crossmatch Result Compatible    Unit Number [id number]    Blood Component Type RED CELLS,LR    Unit division 00    Status of Unit ISSUED    Transfusion Status OK TO TRANSFUSE    Crossmatch Result Compatible   ABO/Rh     Status: None   Collection Time: 03/20/17  3:59 AM  Result Value Ref Range   ABO/RH(D) O NEG   Prepare fresh frozen plasma     Status: None (Preliminary result)   Collection Time: 03/20/17  4:43 AM  Result Value Ref Range   Unit Number [id number]    Blood Component Type THAWED PLASMA    Unit division 00    Status of Unit ALLOCATED    Transfusion Status OK TO TRANSFUSE    Unit Number [id number]    Blood Component Type THAWED PLASMA    Unit division 00    Status of Unit ISSUED,FINAL    Transfusion Status OK TO TRANSFUSE   Prepare RBC     Status: None   Collection Time: 03/20/17  5:45 AM  Result Value Ref Range   Order Confirmation ORDER PROCESSED BY BLOOD BANK   CBG monitoring, ED     Status: Abnormal   Collection Time: 03/20/17  8:49 AM  Result Value Ref Range   Glucose-Capillary 272 (H) 65 - 99 mg/dL  CBG monitoring, ED     Status: Abnormal   Collection Time: 03/20/17 11:54 AM  Result Value Ref Range   Glucose-Capillary 267 (H) 65 - 99 mg/dL   Comment 1 Notify RN   CBG monitoring, ED     Status: Abnormal   Collection Time: 03/20/17  2:03 PM  Result Value Ref Range   Glucose-Capillary 209  (H) 65 - 99 mg/dL  Glucose, capillary     Status: Abnormal   Collection Time: 03/20/17  4:55 PM  Result Value Ref Range   Glucose-Capillary 132 (H) 65 - 99 mg/dL  MRSA PCR Screening     Status: None   Collection Time: 03/20/17  5:08 PM  Result Value Ref Range   MRSA by PCR NEGATIVE NEGATIVE    Comment:        The GeneXpert MRSA Assay (FDA approved for NASAL specimens only), is one component of a comprehensive MRSA colonization surveillance program. It is not intended to diagnose MRSA infection nor to guide or monitor treatment for MRSA infections.   CBC     Status: Abnormal   Collection Time: 03/20/17  7:54 PM  Result Value Ref Range   WBC 12.7 (H) 4.0 - 10.5 K/uL   RBC 2.17 (L) 4.22 - 5.81 MIL/uL   Hemoglobin 7.0 (L) 13.0 - 17.0 g/dL   HCT 20.0 (L) 39.0 - 52.0 %   MCV 92.2 78.0 - 100.0 fL   MCH 32.3 26.0 - 34.0 pg   MCHC 35.0 30.0 - 36.0 g/dL   RDW 15.3 11.5 - 15.5 %   Platelets 118 (L) 150 - 400 K/uL    Comment: REPEATED TO  VERIFY SPECIMEN CHECKED FOR CLOTS PLATELET COUNT CONFIRMED BY SMEAR   Protime-INR     Status: Abnormal   Collection Time: 03/20/17  7:54 PM  Result Value Ref Range   Prothrombin Time 15.9 (H) 11.4 - 15.2 seconds   INR 1.28   Glucose, capillary     Status: Abnormal   Collection Time: 03/20/17  9:18 PM  Result Value Ref Range   Glucose-Capillary 192 (H) 65 - 99 mg/dL  CBC     Status: Abnormal   Collection Time: 03/21/17  3:10 AM  Result Value Ref Range   WBC 11.0 (H) 4.0 - 10.5 K/uL   RBC 1.94 (L) 4.22 - 5.81 MIL/uL   Hemoglobin 6.0 (LL) 13.0 - 17.0 g/dL    Comment: REPEATED TO VERIFY CRITICAL RESULT CALLED TO, READ BACK BY AND VERIFIED WITH: A LOWE,RN 0348 03/21/17 D BRADLEY    HCT 17.8 (L) 39.0 - 52.0 %   MCV 91.8 78.0 - 100.0 fL   MCH 30.9 26.0 - 34.0 pg   MCHC 33.7 30.0 - 36.0 g/dL   RDW 14.7 11.5 - 15.5 %   Platelets 112 (L) 150 - 400 K/uL    Comment: CONSISTENT WITH PREVIOUS RESULT  Protime-INR     Status: None   Collection Time:  03/21/17  3:10 AM  Result Value Ref Range   Prothrombin Time 15.2 11.4 - 15.2 seconds   INR 7.94   Basic metabolic panel     Status: Abnormal   Collection Time: 03/21/17  3:10 AM  Result Value Ref Range   Sodium 134 (L) 135 - 145 mmol/L   Potassium 3.9 3.5 - 5.1 mmol/L   Chloride 104 101 - 111 mmol/L   CO2 24 22 - 32 mmol/L   Glucose, Bld 200 (H) 65 - 99 mg/dL   BUN 14 6 - 20 mg/dL   Creatinine, Ser 1.26 (H) 0.61 - 1.24 mg/dL   Calcium 7.8 (L) 8.9 - 10.3 mg/dL   GFR calc non Af Amer 53 (L) >60 mL/min   GFR calc Af Amer >60 >60 mL/min    Comment: (NOTE) The eGFR has been calculated using the CKD EPI equation. This calculation has not been validated in all clinical situations. eGFR's persistently <60 mL/min signify possible Chronic Kidney Disease.    Anion gap 6 5 - 15  Prepare RBC     Status: None   Collection Time: 03/21/17  3:53 AM  Result Value Ref Range   Order Confirmation ORDER PROCESSED BY BLOOD BANK   Glucose, capillary     Status: Abnormal   Collection Time: 03/21/17  7:46 AM  Result Value Ref Range   Glucose-Capillary 230 (H) 65 - 99 mg/dL   Comment 1 Notify RN    Comment 2 Document in Chart     No results found.  Review of Systems  All other systems reviewed and are negative.  Blood pressure (!) 157/81, pulse 85, temperature 98.1 F (36.7 C), temperature source Oral, resp. rate 20, height 5' 6" (1.676 m), weight 63 kg (138 lb 14.2 oz), SpO2 97 %. Physical Exam  Constitutional: He is oriented to person, place, and time. He appears well-developed and well-nourished. No distress.  HENT:  Head: Normocephalic and atraumatic.  Mouth/Throat: No oropharyngeal exudate.  Eyes: Right eye exhibits no discharge. Left eye exhibits no discharge. No scleral icterus.  Neck: Normal range of motion. Neck supple. No JVD present. No tracheal deviation present. No thyromegaly present.  + bruits  Cardiovascular: Normal rate  and regular rhythm.   Murmur (mild aortic murmur)  heard. Respiratory: Effort normal and breath sounds normal. No respiratory distress. He has no wheezes. He has no rales. He exhibits no tenderness.  His whole right chest wall pectoralis and lateral area is swollen, warm, ecchymotic and bruised.  He has significant swelling of the area especially over the pectoralis.  The area is warm also but no erythema.  It is also in the axilla and down to the lower right arm.    GI: Soft. Bowel sounds are normal. He exhibits no distension and no mass. There is no tenderness. There is no rebound and no guarding.  Musculoskeletal: He exhibits no edema or tenderness.  Lymphadenopathy:    He has no cervical adenopathy.  Neurological: He is alert and oriented to person, place, and time. No cranial nerve deficit.  Skin: Skin is warm and dry. He is not diaphoretic.  I have one picture of the site below  Psychiatric: He has a normal mood and affect. His behavior is normal. Judgment and thought content normal.   03/20/17  3 AM   03/21/17 10:26 AM  Assessment/Plan: Spontaneous bleed right chest on coumadin Hx of PE - on chronic coumadin CAD/PVD Hx of prostate cancer Type II diabetes  Plan:  Dr. [name] will review but I would recommend a chest binder and apply, Ice to affected areas.  You have already corrected the coagulopathy.  Transfuse to a safe level.   Control BP and maintain pain control.     JENNINGS,WILLARD 03/21/2017, 9:48 AM

## 2017-03-21 NOTE — Progress Notes (Signed)
PROGRESS NOTE    Bryan Carney  POE:[id number] DOB: 03-15-1941 DOA: 03/20/2017 PCP: [name], NP   Brief Narrative: 76 year old male with history of coronary artery disease, peripheral vascular disease, diabetes, hypertension, history of PE in 1990s on Coumadin presented with right chest wall swelling, bruises and hematoma. In the ER patient with hemoglobin of 6.4 with INR 4.8. Received FFP, noted blood cell and vitamin K. Admitted for further evaluation.  Assessment & Plan:  # Spontaneous right chest wall hematoma in the setting of Coumadin: -Denied chest pain or shortness of breath. Coumadin was reversed with FFP and vitamin K. INR 1.2 today. Control hypertension. -Monitor CBC and transfuse red blood cells. -Receive consulted general surgery. -Later discussed with interventional radiology Dr.Wagner because of further drop in hemoglobin.  -Follow-up consultants plan.  # Acute blood loss anemia: Received 1 unit of blood for transfusion. Monitor CBC every 8 hourly. May need further red blood cell transfusion.  #History of PE on Coumadin: Currently Coumadin on hold. No known history of DVT. PE was in late 1990s. Monitor INR. Supratherapeutic INR on admission.  #Diabetes type 2: Continue sliding scale and started low-dose Lantus. Monitor blood sugar level.  #History of coronary artery disease: Continue Zocor. Aspirin currently on hold because of hematoma.  #Hypertension: Blood pressure elevated. Resume Lotensin and norvasc.  #Dyslipidemia: Continue Zocor.  #Acute kidney injury: Serum creatinine level trending down. Creatinine of 1.5 on admission. Unknown baseline serum creatinine level. Check UA. 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 Recommend outpatient follow-up.  DVT prophylaxis: SCD Code Status: Full code Family Communication: Discussed with patient's wife at bedside Disposition Plan: Current in the  stepdown    Consultants:   Pulmonologist  General surgery   IR  Procedures: None Antimicrobials: None  Subjective: Seen and examined at bedside. Denied chest pain, shortness of breath, nausea vomiting. Reports lightheadedness and weakness.  Objective: Vitals:   03/21/17 0442 03/21/17 0459 03/21/17 0740 03/21/17 1000  BP: (!) 150/62 (!) 155/57 (!) 157/81 (!) 163/68  Pulse: 73 73 85 83  Resp: 18 15 20  (!) 23  Temp: 98.3 F (36.8 C) 98.1 F (36.7 C) 98.1 F (36.7 C)   TempSrc: Oral Oral Oral   SpO2: 99% 97%  98%  Weight:      Height:        Intake/Output Summary (Last 24 hours) at 03/21/17 1220 Last data filed at 03/21/17 0745  Gross per 24 hour  Intake              677 ml  Output             1200 ml  Net             -523 ml   Filed Weights   03/20/17 1616  Weight: 63 kg (138 lb 14.2 oz)    Examination:  General exam: Appears calm and comfortable , Pale Respiratory system: Clear to auscultation. Respiratory effort normal. No wheezing or crackle. Right chest with bruises and swelling consistent with hematoma Cardiovascular system: S1 & S2 heard, RRR.  No pedal edema. Gastrointestinal system: Abdomen is nondistended, soft and nontender. Normal bowel sounds heard. Central nervous system: Alert and oriented. No focal neurological deficits. Extremities: Symmetric 5 x 5 power. Skin: No rashes, lesions or ulcers Psychiatry: Judgement and insight appear normal. Mood & affect appropriate.     Data Reviewed: I have personally reviewed following labs and imaging studies  CBC:  Recent Labs Lab 03/18/17 1933 03/19/17 0621 03/20/17 0315 03/20/17 1954 03/21/17 0310 03/21/17 1137  WBC 11.0*  --  12.4* 12.7* 11.0* 10.8*  HGB 11.2* 9.5* 6.4* 7.0* 6.0* 7.7*  HCT 34.4* 28.8* 19.0* 20.0* 17.8* 22.5*  MCV 97.5  --  96.0 92.2 91.8 90.4  PLT 193  --  154 118* 112* 785*   Basic Metabolic Panel:  Recent Labs Lab 03/18/17 1933 03/20/17 0315 03/21/17 0310  NA 137  132* 134*  K 4.5 4.5 3.9  CL 108 105 104  CO2 24 21* 24  GLUCOSE 197* 340* 200*  BUN 23* 20 14  CREATININE 1.59* 1.52* 1.26*  CALCIUM 8.8* 7.3* 7.8*   GFR: Estimated Creatinine Clearance: 43.8 mL/min (A) (by C-G formula based on SCr of 1.26 mg/dL (H)). Liver Function Tests:  Recent Labs Lab 03/18/17 1933  AST 21  ALT 13*  ALKPHOS 68  BILITOT 1.5*  PROT 5.9*  ALBUMIN 4.0   No results for input(s): LIPASE, AMYLASE in the last 168 hours. No results for input(s): AMMONIA in the last 168 hours. Coagulation Profile:  Recent Labs Lab 03/18/17 1933 03/20/17 0331 03/20/17 1954 03/21/17 0310  INR 3.06 4.82* 1.28 1.21   Cardiac Enzymes: No results for input(s): CKTOTAL, CKMB, CKMBINDEX, TROPONINI in the last 168 hours. BNP (last 3 results) No results for input(s): PROBNP in the last 8760 hours. HbA1C: No results for input(s): HGBA1C in the last 72 hours. CBG:  Recent Labs Lab 03/20/17 1154 03/20/17 1403 03/20/17 1655 03/20/17 2118 03/21/17 0746  GLUCAP 267* 209* 132* 192* 230*   Lipid Profile: No results for input(s): CHOL, HDL, LDLCALC, TRIG, CHOLHDL, LDLDIRECT in the last 72 hours. Thyroid Function Tests: No results for input(s): TSH, T4TOTAL, FREET4, T3FREE, THYROIDAB in the last 72 hours. Anemia Panel: No results for input(s): VITAMINB12, FOLATE, FERRITIN, TIBC, IRON, RETICCTPCT in the last 72 hours. Sepsis Labs: No results for input(s): PROCALCITON, LATICACIDVEN in the last 168 hours.  Recent Results (from the past 240 hour(s))  MRSA PCR Screening     Status: None   Collection Time: 03/20/17  5:08 PM  Result Value Ref Range Status   MRSA by PCR NEGATIVE NEGATIVE Final    Comment:        The GeneXpert MRSA Assay (FDA approved for NASAL specimens only), is one component of a comprehensive MRSA colonization surveillance program. It is not intended to diagnose MRSA infection nor to guide or monitor treatment for MRSA infections.           Radiology Studies: No results found.      Scheduled Meds: . ferrous sulfate  325 mg Oral Q breakfast  . Influenza vac split quadrivalent PF  0.5 mL Intramuscular Tomorrow-1000  . insulin aspart  0-15 Units Subcutaneous TID WC  . insulin aspart  0-5 Units Subcutaneous QHS  . insulin glargine  6 Units Subcutaneous Daily  . simvastatin  40 mg Oral Daily   Continuous Infusions:   LOS: 1 day    Dron Tanna Furry, MD [facility] Pager [phone]  If 7PM-7AM, please contact night-coverage [email or web address] Password [email or web address] 03/21/2017, 12:20 PM

## 2017-03-21 NOTE — Progress Notes (Signed)
Breast binder applied to upper chest over hematoma area, ice pack laid on top per MD order. Will monitor.

## 2017-03-21 NOTE — Progress Notes (Signed)
CRITICAL VALUE ALERT  Critical Value: hemoglobin 6.0  Date & Time Notied:  03/21/2017 0348  Provider Notified: Dr.T. Opyd  Orders Received/Actions taken: new orders received

## 2017-03-21 NOTE — Progress Notes (Signed)
patient went into afib with rvr. No symptoms, no chest pain or SOB. HR 110-130's. BP stable. 12 lead EKG performed to confirm. Hospitliast notified awaiting further orders.

## 2017-03-21 NOTE — Progress Notes (Signed)
Chart reviewed.  Pt remains in SDU.  Hemodynamically stable.  Received PRBC for Hgb 6.0 this am.  INR corrected.  No need for ICU at this time.    PCCM will sign off, please call back if needed.   Nickolas Madrid, NP 03/21/2017  11:30 AM Pager: [phone] or [phone]

## 2017-03-21 NOTE — Consult Note (Signed)
Chief Complaint: chest hematoma  Referring Physician:Dr. Lawson Radar  Supervising Physician: Corrie Mckusick  Patient Status: [facility] - In-pt  HPI: Bryan Carney is a 76 y.o. male on coumadin for a history of a PE who noticed Monday some soreness under his arm.  As the day progressed, he noticed some redness along his chest wall and axilla.  He was brought to the ED for evaluation and had a CT of the chest that revealed a hematoma.  He was offered observation, but felt stable and decided to go home.  When he returned home he began to get weaker and more pale and came back to the hospital yesterday.  His hgb was noted to be 6.4.  His INR was 4.8.  He has been transfused Vit K and FFP as well as pRBCs.  The patient has been seen by surgery who placed him in a breast binder.  IR has been asked to see him to confirm no further intervention is needed.  Past Medical History:  Past Medical History:  Diagnosis Date  . CAD (coronary artery disease)   . Diabetes ([place])   . Hx pulmonary embolism   . Hyperlipidemia   . Hypertension   . Prostate cancer ([place])   . PVOD (pulmonary veno-occlusive disease) ([place])   . Squamous cell carcinoma     Past Surgical History:  Past Surgical History:  Procedure Laterality Date  . BACK SURGERY    . CARDIAC CATHETERIZATION  10/15/1997   Recommend complete revascularization by CABG  . CARDIOVASCULAR STRESS TEST  06/12/2012   No evidence of ischemia  . CAROTID DOPPLER  08/27/2012   Rt bulb/proximal ICA demonstrated a mild-moderate amount of fibrous plaque without evidence of a significant diameter reduction or any other vascular abnormality.  . CAROTID ENDARTERECTOMY Left 02/23/1995  . CORONARY ARTERY BYPASS GRAFT  10/19/1997   x5. LIMA-LAD, SVG-PDA, SVG-OM1, seq SVG-fist and second branches of ramus intermedius.    Family History:  Family History  Problem Relation Age of Onset  . Heart attack Father   . Cancer Sister   . Diabetes Brother   . Cancer Sister     . Diabetes Brother   . Diabetes Brother   . Diabetes Brother     Social History:  reports that he has never smoked. He has never used smokeless tobacco. He reports that he does not drink alcohol. His drug history is not on file.  Allergies:  Allergies  Allergen Reactions  . Lopressor [Metoprolol Tartrate] Other (See Comments)    Severe BP drop    Medications: Medications reviewed in epic  Please HPI for pertinent positives, otherwise complete 10 system ROS negative.  Physical Exam: BP (!) 150/75   Pulse 91   Temp 98.2 F (36.8 C) (Oral)   Resp (!) 23   Ht '5\' 6"'  (1.676 m)   Wt 138 lb 14.2 oz (63 kg)   SpO2 96%   BMI 22.42 kg/m  Body mass index is 22.42 kg/m. General: pleasant, WD, WN white male who is laying in bed in NAD HEENT: head is normocephalic, atraumatic.  Sclera are noninjected.  PERRL.  Ears and nose without any masses or lesions.  Mouth is pink and moist Heart: regular, rate, and rhythm.  Normal s1,s2. No obvious gallops, or rubs noted. +murmur Palpable radial pulses bilaterally Lungs/chest: CTAB, no wheezes, rhonchi, or rales noted.  Respiratory effort nonlabored.  Large right chest wall hematoma with extensive ecchymosis.  Breast binder in place Abd: soft,  NT, ND, +BS, no masses, hernias, or organomegaly Psych: A&Ox3 with an appropriate affect.   Labs: Results for orders placed or performed during the hospital encounter of 03/20/17 (from the past 48 hour(s))  Basic metabolic panel     Status: Abnormal   Collection Time: 03/20/17  3:15 AM  Result Value Ref Range   Sodium 132 (L) 135 - 145 mmol/L   Potassium 4.5 3.5 - 5.1 mmol/L   Chloride 105 101 - 111 mmol/L   CO2 21 (L) 22 - 32 mmol/L   Glucose, Bld 340 (H) 65 - 99 mg/dL   BUN 20 6 - 20 mg/dL   Creatinine, Ser 1.52 (H) 0.61 - 1.24 mg/dL   Calcium 7.3 (L) 8.9 - 10.3 mg/dL   GFR calc non Af Amer 42 (L) >60 mL/min   GFR calc Af Amer 49 (L) >60 mL/min    Comment: (NOTE) The eGFR has been calculated  using the CKD EPI equation. This calculation has not been validated in all clinical situations. eGFR's persistently <60 mL/min signify possible Chronic Kidney Disease.    Anion gap 6 5 - 15  CBC     Status: Abnormal   Collection Time: 03/20/17  3:15 AM  Result Value Ref Range   WBC 12.4 (H) 4.0 - 10.5 K/uL   RBC 1.98 (L) 4.22 - 5.81 MIL/uL   Hemoglobin 6.4 (LL) 13.0 - 17.0 g/dL    Comment: REPEATED TO VERIFY DELTA CHECK NOTED SPECIMEN CHECKED FOR CLOTS CRITICAL RESULT CALLED TO, READ BACK BY AND VERIFIED WITH: M. DAVIS,RN [id number] 03/20/2017 T. TYSOR    HCT 19.0 (L) 39.0 - 52.0 %   MCV 96.0 78.0 - 100.0 fL   MCH 32.3 26.0 - 34.0 pg   MCHC 33.7 30.0 - 36.0 g/dL   RDW 12.6 11.5 - 15.5 %   Platelets 154 150 - 400 K/uL  CBG monitoring, ED     Status: Abnormal   Collection Time: 03/20/17  3:19 AM  Result Value Ref Range   Glucose-Capillary 311 (H) 65 - 99 mg/dL   Comment 1 Notify RN    Comment 2 Document in Chart   Protime-INR     Status: Abnormal   Collection Time: 03/20/17  3:31 AM  Result Value Ref Range   Prothrombin Time 47.1 (H) 11.4 - 15.2 seconds   INR 4.82 (HH)     Comment: REPEATED TO VERIFY CRITICAL RESULT CALLED TO, READ BACK BY AND VERIFIED WITH: M. DAVIS,RN [id number] 03/20/2017 T. TYSOR   Type and screen     Status: None (Preliminary result)   Collection Time: 03/20/17  3:59 AM  Result Value Ref Range   ABO/RH(D) O NEG    Antibody Screen NEG    Sample Expiration 03/23/2017    Unit Number [id number]    Blood Component Type RCLI PHER 2    Unit division 00    Status of Unit ISSUED,FINAL    Transfusion Status OK TO TRANSFUSE    Crossmatch Result Compatible    Unit Number [id number]    Blood Component Type RED CELLS,LR    Unit division 00    Status of Unit ALLOCATED    Transfusion Status OK TO TRANSFUSE    Crossmatch Result Compatible    Unit Number [id number]    Blood Component Type RED CELLS,LR    Unit division 00    Status of Unit ISSUED     Transfusion Status OK TO TRANSFUSE    Crossmatch Result Compatible   ABO/Rh  Status: None   Collection Time: 03/20/17  3:59 AM  Result Value Ref Range   ABO/RH(D) O NEG   Prepare fresh frozen plasma     Status: None   Collection Time: 03/20/17  4:43 AM  Result Value Ref Range   Unit Number [id number]    Blood Component Type THAWED PLASMA    Unit division 00    Status of Unit REL FROM [facility]    Transfusion Status OK TO TRANSFUSE    Unit Number [id number]    Blood Component Type THAWED PLASMA    Unit division 00    Status of Unit ISSUED,FINAL    Transfusion Status OK TO TRANSFUSE   Prepare RBC     Status: None   Collection Time: 03/20/17  5:45 AM  Result Value Ref Range   Order Confirmation ORDER PROCESSED BY BLOOD BANK   CBG monitoring, ED     Status: Abnormal   Collection Time: 03/20/17  8:49 AM  Result Value Ref Range   Glucose-Capillary 272 (H) 65 - 99 mg/dL  CBG monitoring, ED     Status: Abnormal   Collection Time: 03/20/17 11:54 AM  Result Value Ref Range   Glucose-Capillary 267 (H) 65 - 99 mg/dL   Comment 1 Notify RN   CBG monitoring, ED     Status: Abnormal   Collection Time: 03/20/17  2:03 PM  Result Value Ref Range   Glucose-Capillary 209 (H) 65 - 99 mg/dL  Glucose, capillary     Status: Abnormal   Collection Time: 03/20/17  4:55 PM  Result Value Ref Range   Glucose-Capillary 132 (H) 65 - 99 mg/dL  MRSA PCR Screening     Status: None   Collection Time: 03/20/17  5:08 PM  Result Value Ref Range   MRSA by PCR NEGATIVE NEGATIVE    Comment:        The GeneXpert MRSA Assay (FDA approved for NASAL specimens only), is one component of a comprehensive MRSA colonization surveillance program. It is not intended to diagnose MRSA infection nor to guide or monitor treatment for MRSA infections.   CBC     Status: Abnormal   Collection Time: 03/20/17  7:54 PM  Result Value Ref Range   WBC 12.7 (H) 4.0 - 10.5 K/uL   RBC 2.17 (L) 4.22 - 5.81 MIL/uL    Hemoglobin 7.0 (L) 13.0 - 17.0 g/dL   HCT 20.0 (L) 39.0 - 52.0 %   MCV 92.2 78.0 - 100.0 fL   MCH 32.3 26.0 - 34.0 pg   MCHC 35.0 30.0 - 36.0 g/dL   RDW 15.3 11.5 - 15.5 %   Platelets 118 (L) 150 - 400 K/uL    Comment: REPEATED TO VERIFY SPECIMEN CHECKED FOR CLOTS PLATELET COUNT CONFIRMED BY SMEAR   Protime-INR     Status: Abnormal   Collection Time: 03/20/17  7:54 PM  Result Value Ref Range   Prothrombin Time 15.9 (H) 11.4 - 15.2 seconds   INR 1.28   Glucose, capillary     Status: Abnormal   Collection Time: 03/20/17  9:18 PM  Result Value Ref Range   Glucose-Capillary 192 (H) 65 - 99 mg/dL  CBC     Status: Abnormal   Collection Time: 03/21/17  3:10 AM  Result Value Ref Range   WBC 11.0 (H) 4.0 - 10.5 K/uL   RBC 1.94 (L) 4.22 - 5.81 MIL/uL   Hemoglobin 6.0 (LL) 13.0 - 17.0 g/dL    Comment: REPEATED TO VERIFY CRITICAL  RESULT CALLED TO, READ BACK BY AND VERIFIED WITH: A LOWE,RN [id number] 03/21/17 D BRADLEY    HCT 17.8 (L) 39.0 - 52.0 %   MCV 91.8 78.0 - 100.0 fL   MCH 30.9 26.0 - 34.0 pg   MCHC 33.7 30.0 - 36.0 g/dL   RDW 14.7 11.5 - 15.5 %   Platelets 112 (L) 150 - 400 K/uL    Comment: CONSISTENT WITH PREVIOUS RESULT  Protime-INR     Status: None   Collection Time: 03/21/17  3:10 AM  Result Value Ref Range   Prothrombin Time 15.2 11.4 - 15.2 seconds   INR 0.10   Basic metabolic panel     Status: Abnormal   Collection Time: 03/21/17  3:10 AM  Result Value Ref Range   Sodium 134 (L) 135 - 145 mmol/L   Potassium 3.9 3.5 - 5.1 mmol/L   Chloride 104 101 - 111 mmol/L   CO2 24 22 - 32 mmol/L   Glucose, Bld 200 (H) 65 - 99 mg/dL   BUN 14 6 - 20 mg/dL   Creatinine, Ser 1.26 (H) 0.61 - 1.24 mg/dL   Calcium 7.8 (L) 8.9 - 10.3 mg/dL   GFR calc non Af Amer 53 (L) >60 mL/min   GFR calc Af Amer >60 >60 mL/min    Comment: (NOTE) The eGFR has been calculated using the CKD EPI equation. This calculation has not been validated in all clinical situations. eGFR's persistently <60  mL/min signify possible Chronic Kidney Disease.    Anion gap 6 5 - 15  Prepare RBC     Status: None   Collection Time: 03/21/17  3:53 AM  Result Value Ref Range   Order Confirmation ORDER PROCESSED BY BLOOD BANK   Glucose, capillary     Status: Abnormal   Collection Time: 03/21/17  7:46 AM  Result Value Ref Range   Glucose-Capillary 230 (H) 65 - 99 mg/dL   Comment 1 Notify RN    Comment 2 Document in Chart   CBC     Status: Abnormal   Collection Time: 03/21/17 11:37 AM  Result Value Ref Range   WBC 10.8 (H) 4.0 - 10.5 K/uL   RBC 2.49 (L) 4.22 - 5.81 MIL/uL   Hemoglobin 7.7 (L) 13.0 - 17.0 g/dL    Comment: REPEATED TO VERIFY POST TRANSFUSION SPECIMEN    HCT 22.5 (L) 39.0 - 52.0 %   MCV 90.4 78.0 - 100.0 fL   MCH 30.9 26.0 - 34.0 pg   MCHC 34.2 30.0 - 36.0 g/dL   RDW 15.1 11.5 - 15.5 %   Platelets 123 (L) 150 - 400 K/uL  Glucose, capillary     Status: Abnormal   Collection Time: 03/21/17  1:08 PM  Result Value Ref Range   Glucose-Capillary 161 (H) 65 - 99 mg/dL   Comment 1 Notify RN    Comment 2 Document in Chart     Imaging: No results found.  Assessment/Plan 1. Chest wall hematoma  The patient's CT scan does not show active extravasation.  He has been transfused and as well as INR corrected.  He is currently stable.  No intervention is needed at this point.  Agree with assessment by general surgery with breast binder.  Transfuse as needed per primary service.  Will follow for now.  Thank you for this interesting consult.  I greatly enjoyed meeting YOSMAR RYKER and look forward to participating in their care.  A copy of this report was sent to the  requesting provider on this date.  Electronically Signed: Henreitta Cea 03/21/2017, 2:11 PM   I spent a total of 40 Minutes    in face to face in clinical consultation, greater than 50% of which was counseling/coordinating care for chest wall hematoma

## 2017-03-21 NOTE — Progress Notes (Signed)
Inpatient Diabetes Program Recommendations  AACE/ADA: New Consensus Statement on Inpatient Glycemic Control (2015)  Target Ranges:  Prepandial:   less than 140 mg/dL      Peak postprandial:   less than 180 mg/dL (1-2 hours)      Critically ill patients:  140 - 180 mg/dL   Lab Results  Component Value Date   GLUCAP 230 (H) 03/21/2017    Review of Glycemic Control Results for Bryan Carney, Bryan Carney (MRN [id number]) as of 03/21/2017 10:54  Ref. Range 03/20/2017 08:49 03/20/2017 11:54 03/20/2017 14:03 03/20/2017 16:55 03/20/2017 21:18 03/21/2017 07:46  Glucose-Capillary Latest Ref Range: 65 - 99 mg/dL 272 (H) 267 (H) 209 (H) 132 (H) 192 (H) 230 (H)   Diabetes history: DM2 Outpatient Diabetes medications: Kombiglyze XR 10-998 Daily Current orders for Inpatient glycemic control: Novolog correction (RN to clarify if sensitive or moderate scale) + 0-5 units hs  Inpatient Diabetes Program Recommendations:   Please consider while in the hospital: -Lantus 10 units daily Will follow during hospitalization.  Thank you, Nani Gasser. Garnetta Fedrick, RN, MSN, CDE  Diabetes Coordinator Inpatient Glycemic Control Team Team Pager [phone] (8am-5pm) 03/21/2017 11:02 AM

## 2017-03-21 NOTE — Progress Notes (Signed)
PT Cancellation Note  Patient Details Name: Bryan Carney MRN: [id number] DOB: 1941/01/18   Cancelled Treatment:    Reason Eval/Treat Not Completed: Medical issues which prohibited therapy (Pt with Hgb of 6. Nurse asked PT to [place]. )   Denice Paradise 03/21/2017, 10:49 AM  [facility] [phone] (pager)

## 2017-03-22 DIAGNOSIS — I480 Paroxysmal atrial fibrillation: Secondary | ICD-10-CM

## 2017-03-22 DIAGNOSIS — I10 Essential (primary) hypertension: Secondary | ICD-10-CM

## 2017-03-22 DIAGNOSIS — I2583 Coronary atherosclerosis due to lipid rich plaque: Secondary | ICD-10-CM

## 2017-03-22 DIAGNOSIS — I2510 Atherosclerotic heart disease of native coronary artery without angina pectoris: Secondary | ICD-10-CM

## 2017-03-22 DIAGNOSIS — R791 Abnormal coagulation profile: Secondary | ICD-10-CM

## 2017-03-22 DIAGNOSIS — T148XXA Other injury of unspecified body region, initial encounter: Secondary | ICD-10-CM

## 2017-03-22 DIAGNOSIS — D62 Acute posthemorrhagic anemia: Secondary | ICD-10-CM

## 2017-03-22 LAB — BASIC METABOLIC PANEL
ANION GAP: 6 (ref 5–15)
BUN: 21 mg/dL — ABNORMAL HIGH (ref 6–20)
CHLORIDE: 103 mmol/L (ref 101–111)
CO2: 25 mmol/L (ref 22–32)
Calcium: 7.8 mg/dL — ABNORMAL LOW (ref 8.9–10.3)
Creatinine, Ser: 1.35 mg/dL — ABNORMAL HIGH (ref 0.61–1.24)
GFR calc non Af Amer: 49 mL/min — ABNORMAL LOW (ref >60)
GFR, EST AFRICAN AMERICAN: 57 mL/min — AB (ref >60)
Glucose, Bld: 178 mg/dL — ABNORMAL HIGH (ref 65–99)
Potassium: 3.8 mmol/L (ref 3.5–5.1)
Sodium: 134 mmol/L — ABNORMAL LOW (ref 135–145)

## 2017-03-22 LAB — CBC
HCT: 22.6 % — ABNORMAL LOW (ref 39.0–52.0)
HEMOGLOBIN: 7.7 g/dL — AB (ref 13.0–17.0)
MCH: 31 pg (ref 26.0–34.0)
MCHC: 34.1 g/dL (ref 30.0–36.0)
MCV: 91.1 fL (ref 78.0–100.0)
Platelets: 126 10*3/uL — ABNORMAL LOW (ref 150–400)
RBC: 2.48 MIL/uL — AB (ref 4.22–5.81)
RDW: 14.7 % (ref 11.5–15.5)
WBC: 10.6 10*3/uL — ABNORMAL HIGH (ref 4.0–10.5)

## 2017-03-22 LAB — TSH: TSH: 1.276 u[IU]/mL (ref 0.350–4.500)

## 2017-03-22 LAB — TROPONIN I
Troponin I: 0.04 ng/mL (ref <0.03)
Troponin I: 0.04 ng/mL (ref <0.03)
Troponin I: 0.04 ng/mL (ref <0.03)

## 2017-03-22 LAB — BLOOD GAS, ARTERIAL
Acid-Base Excess: 1.5 mmol/L (ref 0.0–2.0)
Bicarbonate: 24.7 mmol/L (ref 20.0–28.0)
O2 CONTENT: 2 L/min
O2 Saturation: 88.7 %
PATIENT TEMPERATURE: 98.6
PH ART: 7.48 — AB (ref 7.350–7.450)
pCO2 arterial: 33.6 mmHg (ref 32.0–48.0)
pO2, Arterial: 51.6 mmHg — ABNORMAL LOW (ref 83.0–108.0)

## 2017-03-22 LAB — GLUCOSE, CAPILLARY
GLUCOSE-CAPILLARY: 291 mg/dL — AB (ref 65–99)
GLUCOSE-CAPILLARY: 304 mg/dL — AB (ref 65–99)
Glucose-Capillary: 189 mg/dL — ABNORMAL HIGH (ref 65–99)
Glucose-Capillary: 177 mg/dL — ABNORMAL HIGH (ref 65–99)

## 2017-03-22 LAB — AMMONIA: AMMONIA: 17 umol/L (ref 9–35)

## 2017-03-22 LAB — PROTIME-INR
INR: 1.11
Prothrombin Time: 14.3 seconds (ref 11.4–15.2)

## 2017-03-22 LAB — MAGNESIUM: MAGNESIUM: 1.9 mg/dL (ref 1.7–2.4)

## 2017-03-22 MED ORDER — DILTIAZEM HCL 100 MG IV SOLR
5.0000 mg/h | INTRAVENOUS | Status: DC
  Administered 2017-03-22: 15 mg/h via INTRAVENOUS
  Administered 2017-03-22: 5 mg/h via INTRAVENOUS
  Filled 2017-03-22 (×3): qty 100

## 2017-03-22 MED ORDER — DILTIAZEM HCL 30 MG PO TABS
30.0000 mg | ORAL_TABLET | Freq: Two times a day (BID) | ORAL | Status: DC
  Administered 2017-03-22 – 2017-03-23 (×2): 30 mg via ORAL
  Filled 2017-03-22 (×3): qty 1

## 2017-03-22 NOTE — Evaluation (Signed)
Physical Therapy Evaluation Patient Details Name: Bryan Carney MRN: [id number] DOB: 1940-10-28 Today's Date: 03/22/2017   History of Present Illness  Bryan Carney is a 76 y.o. male with a past medical history significant for hx of PE on warfarin, CAD/PVD on aspirin, NIDDM, and HTN who presents with spontaneous chest wall hematoma and now syncope.Pt on coumadin for PE,  presented to the ED on 9/24 with chest wall ecchymosis/bruising Large right pectoralis/sub pectoralis chest wall hematoma. No active extravasation.   Dependent atelectasis in the lung bases, otherwise no acute. PMH for CAD, hx of PE, HTN, hyperlipedemia  Clinical Impression  Pt admitted with above diagnosis. Pt currently with functional limitations due to the deficits listed below (see PT Problem List). Pt was extremely lethargic throughout evaluation and wife and daughter answered the majority of questions during the interview. Pt was maxA for bed mobility. Pt sat EoB requiring maxA for steadying initial and reducing to min guard for about a minute before he started falling asleep again requiring maxA for steadying and for getting back in the bed.  Pt will benefit from skilled PT to increase their independence and safety with mobility to allow discharge to the venue listed below.       Follow Up Recommendations Home health PT;Supervision/Assistance - 24 hour       Recommendations for Other Services OT consult     Precautions / Restrictions Precautions Required Braces or Orthoses: Other Brace/Splint (breast binder over hematoma) Restrictions Weight Bearing Restrictions: No      Mobility  Bed Mobility Overal bed mobility: Needs Assistance Bed Mobility: Supine to Sit;Sit to Supine     Supine to sit: Max assist Sit to supine: Max assist   General bed mobility comments: maxA for LE management off bed, trunk to upright and pad scoot to EoB, maxA for trunk and LE back into bed  Transfers                 General  transfer comment: not attempted due to extreme lethargy         Balance Overall balance assessment: Needs assistance Sitting-balance support: Feet supported;Bilateral upper extremity supported Sitting balance-Leahy Scale: Zero Sitting balance - Comments: required max assist for balance on EoB with sitting able to eventually able to hold seated balance with min guard for about a minute before falling asleep and requiring maxA again,pt with no c/o dizziness or light headedness just reports being very tired, unable to obtain a valid BP in seated,  and returned to bed                                      Pertinent Vitals/Pain Pain Assessment: No/denies pain    Home Living Family/patient expects to be discharged to:: Private residence Living Arrangements: Spouse/significant other Available Help at Discharge: Family;Available 24 hours/day Type of Home: House Home Access: Level entry     Home Layout: One level Home Equipment: None      Prior Function Level of Independence: Independent         Comments: owns car dealership, works full time, Geographical information systems officer Extremity Assessment Upper Extremity Assessment: Generalized weakness    Lower Extremity Assessment Lower Extremity Assessment: Generalized weakness       Communication   Communication: No difficulties  Cognition Arousal/Alertness: Lethargic Behavior During Therapy: Flat affect Overall Cognitive  Status: Within Functional Limits for tasks assessed                                        General Comments General comments (skin integrity, edema, etc.): BP 107/48, SaO2 on RA 91 O2%, HR 73bpm, RR 18bpm, unable to obtain valid BP in sitting, returned to bed where bp 117/41, hr 68 BPM, rr 16 BPM, SaO2 on RA 88%O2, 2L/min supplemental O2 via nasal cannula replaced and SaO2 rebounded to 94%O2        Assessment/Plan    PT Assessment Patient needs  continued PT services  PT Problem List Decreased strength;Decreased range of motion;Decreased activity tolerance;Decreased balance;Decreased mobility;Cardiopulmonary status limiting activity       PT Treatment Interventions DME instruction;Gait training;Functional mobility training;Therapeutic activities;Therapeutic exercise;Balance training;Patient/family education    PT Goals (Current goals can be found in the Care Plan section)  Acute Rehab PT Goals Patient Stated Goal: go home PT Goal Formulation: With patient Time For Goal Achievement: 03/29/17 Potential to Achieve Goals: Good    Frequency Min 3X/week   Barriers to discharge        Co-evaluation               AM-PAC PT "6 Clicks" Daily Activity  Outcome Measure Difficulty turning over in bed (including adjusting bedclothes, sheets and blankets)?: Unable Difficulty moving from lying on back to sitting on the side of the bed? : Unable Difficulty sitting down on and standing up from a chair with arms (e.g., wheelchair, bedside commode, etc,.)?: Unable Help needed moving to and from a bed to chair (including a wheelchair)?: Total Help needed walking in hospital room?: Total Help needed climbing 3-5 steps with a railing? : Total 6 Click Score: 6    End of Session Equipment Utilized During Treatment: Gait belt;Oxygen Activity Tolerance: Patient limited by lethargy Patient left: in bed;with call bell/phone within reach;with family/visitor present Nurse Communication: Mobility status;Other (comment) (extreme lethargy and O2 desaturation) PT Visit Diagnosis: Other abnormalities of gait and mobility (R26.89);Muscle weakness (generalized) (M62.81);Difficulty in walking, not elsewhere classified (R26.2)    Time: 2229-7989 PT Time Calculation (min) (ACUTE ONLY): 36 min   Charges:   PT Evaluation $PT Eval Low Complexity: 1 Low PT Treatments $Therapeutic Activity: 8-22 mins   PT G Codes:        Gracielynn Birkel B. Migdalia Dk  PT, DPT Acute Rehabilitation  [phone] Pager [phone]    [place] 03/22/2017, 11:29 AM

## 2017-03-22 NOTE — Progress Notes (Signed)
HR improved and now dropping to 50s.  DC diltiazem drip, start oral diltiazem 30 q12hr, dc norvasc. May be able to switch to diltiazem CD/SR tomorrow. F/u  Card. Continue tele monitoring, f/u echo.

## 2017-03-22 NOTE — Care Management Note (Signed)
Case Management Note  Patient Details  Name: GIAVONNI FONDER MRN: [id number] Date of Birth: Jun 19, 1941  Subjective/Objective:                 Spoke with patient's daughter. She states that prior to admission her father walked several miles a day and was very active and independent. Se is unsure at this time if we will actually need HH by time of DC if he continues to ambulate as IP. Discussed HH servoces in [facility] and will touch base again prior to DC if [facility] services are still recommended.  Action/Plan:  CM will continue to follow.  Expected Discharge Date:  03/23/17               Expected Discharge Plan:  [place]  In-House Referral:     Discharge planning Services  CM Consult  Post Acute Care Choice:    Choice offered to:  Adult Children  DME Arranged:    DME Agency:     HH Arranged:    HH Agency:     Status of Service:  In process, will continue to follow  If discussed at Long Length of Stay Meetings, dates discussed:    Additional Comments:  Carles Collet, RN 03/22/2017, 3:36 PM

## 2017-03-22 NOTE — Progress Notes (Signed)
PROGRESS NOTE    Bryan Carney  HYI:[id number] DOB: September 12, 1940 DOA: 03/20/2017 PCP: [name], NP   Brief Narrative: 76 year old male with history of coronary artery disease, peripheral vascular disease, diabetes, hypertension, history of PE in 1990s on Coumadin presented with right chest wall swelling, bruises and hematoma. In the ER patient with hemoglobin of 6.4 with INR 4.8. Received FFP, noted blood cell and vitamin K. Admitted for further evaluation.  Assessment & Plan:  # Spontaneous right chest wall hematoma in the setting of Coumadin: -Patient received FFP and vitamin K in the ER. INR 1.1 today. -Hemoglobin is stable with no sign of active bleed. -Patient was evaluated by general surgery and IR. Continue chest binder and conservative management. Discussed with the patient's family members and patient at bedside. Patient denied chest pain or shortness of breath.  # Acute blood loss anemia: Received 1 unit of blood for transfusion. Hemoglobin is stable. Monitor CBC. Continue oral iron.  #History of PE on Coumadin: Currently Coumadin on hold. No known history of DVT. PE was in 1999 in the setting of CABG surgery.  Supratherapeutic INR on admission. I have discussed with the patient and his family members regarding the plan.  # New onset atrial fibrillation with RVR: Currently on diltiazem IV with rate control. Patient follows up with Dr. [name] from cardiology. Cardizem is a consult requested. Check echocardiogram.  #Diabetes type 2: Continue sliding scale and started low-dose Lantus. Monitor blood sugar level.  #History of coronary artery disease: Continue Zocor. May be able to resume aspirin in few days when hematoma improves. Follow up cardiologist planned. -Patient also with mild elevation in troponin level.  #Hypertension: Blood pressure elevated. Resume Lotensin and norvasc.  #Dyslipidemia: Continue Zocor.  #Acute kidney injury: Serum creatinine level stable. Unknown  patient's baseline serum creatinine level. He may have chronic kidney disease. 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 Recommend outpatient follow-up. -Monitor BMP. Avoid nephrotoxins  PT OT evaluation  DVT prophylaxis: SCD Code Status: Full code Family Communication: Discussed with patient's wife, daughter and multiple family members and at bedside Disposition Plan: Current in the stepdown    Consultants:   Pulmonologist  General surgery   IR  Cardiology  Procedures: None Antimicrobials: None  Subjective: Seen and examined at bedside. Denied headache, dizziness, nausea vomiting chest pain or shortness of breath. Objective: Vitals:   03/21/17 2300 03/22/17 0000 03/22/17 0330 03/22/17 0700  BP: 130/69 (!) 107/44 115/63   Pulse: (!) 128 (!) 119 93   Resp: 20 19 19    Temp: 98.4 F (36.9 C)  98.3 F (36.8 C) 98.2 F (36.8 C)  TempSrc: Oral  Oral Oral  SpO2: 96% 98% 97%   Weight:      Height:        Intake/Output Summary (Last 24 hours) at 03/22/17 1109 Last data filed at 03/22/17 1003  Gross per 24 hour  Intake                0 ml  Output             1825 ml  Net            -1825 ml   Filed Weights   03/20/17 1616  Weight: 63 kg (138 lb 14.2 oz)    Examination:  General exam: Not in distress, lying in bed comfortable Respiratory system: Lungs clear bilateral, no wheezing or crackle. Right chest wall bruises and  has chest binder Cardiovascular system: Regular rate rhythm, S1-S2 normal. No pedal edema Gastrointestinal system: Abdomen soft, nontender, nondistended. Bowel sound positive.. Central nervous system: Alert and oriented. No focal neurological deficits. Extremities: Symmetric 5 x 5 power. Skin: No rashes, lesions or ulcers Psychiatry: Judgement and insight appear normal. Mood & affect appropriate.     Data Reviewed: I have personally reviewed following labs and imaging  studies  CBC:  Recent Labs Lab 03/20/17 1954 03/21/17 0310 03/21/17 1137 03/21/17 1937 03/22/17 0106  WBC 12.7* 11.0* 10.8* 10.5 10.6*  HGB 7.0* 6.0* 7.7* 7.6* 7.7*  HCT 20.0* 17.8* 22.5* 22.3* 22.6*  MCV 92.2 91.8 90.4 90.7 91.1  PLT 118* 112* 123* 131* 097*   Basic Metabolic Panel:  Recent Labs Lab 03/18/17 1933 03/20/17 0315 03/21/17 0310 03/22/17 0106  NA 137 132* 134* 134*  K 4.5 4.5 3.9 3.8  CL 108 105 104 103  CO2 24 21* 24 25  GLUCOSE 197* 340* 200* 178*  BUN 23* 20 14 21*  CREATININE 1.59* 1.52* 1.26* 1.35*  CALCIUM 8.8* 7.3* 7.8* 7.8*  MG  --   --   --  1.9   GFR: Estimated Creatinine Clearance: 40.8 mL/min (A) (by C-G formula based on SCr of 1.35 mg/dL (H)). Liver Function Tests:  Recent Labs Lab 03/18/17 1933  AST 21  ALT 13*  ALKPHOS 68  BILITOT 1.5*  PROT 5.9*  ALBUMIN 4.0   No results for input(s): LIPASE, AMYLASE in the last 168 hours. No results for input(s): AMMONIA in the last 168 hours. Coagulation Profile:  Recent Labs Lab 03/18/17 1933 03/20/17 0331 03/20/17 1954 03/21/17 0310 03/22/17 0106  INR 3.06 4.82* 1.28 1.21 1.11   Cardiac Enzymes:  Recent Labs Lab 03/22/17 0106 03/22/17 0543  TROPONINI 0.04* 0.04*   BNP (last 3 results) No results for input(s): PROBNP in the last 8760 hours. HbA1C: No results for input(s): HGBA1C in the last 72 hours. CBG:  Recent Labs Lab 03/21/17 0746 03/21/17 1308 03/21/17 1733 03/21/17 2104 03/22/17 0740  GLUCAP 230* 161* 223* 128* 189*   Lipid Profile: No results for input(s): CHOL, HDL, LDLCALC, TRIG, CHOLHDL, LDLDIRECT in the last 72 hours. Thyroid Function Tests:  Recent Labs  03/22/17 0106  TSH 1.276   Anemia Panel: No results for input(s): VITAMINB12, FOLATE, FERRITIN, TIBC, IRON, RETICCTPCT in the last 72 hours. Sepsis Labs: No results for input(s): PROCALCITON, LATICACIDVEN in the last 168 hours.  Recent Results (from the past 240 hour(s))  MRSA PCR Screening      Status: None   Collection Time: 03/20/17  5:08 PM  Result Value Ref Range Status   MRSA by PCR NEGATIVE NEGATIVE Final    Comment:        The GeneXpert MRSA Assay (FDA approved for NASAL specimens only), is one component of a comprehensive MRSA colonization surveillance program. It is not intended to diagnose MRSA infection nor to guide or monitor treatment for MRSA infections.          Radiology Studies: No results found.      Scheduled Meds: . amitriptyline  10 mg Oral QHS  . amLODipine  5 mg Oral Daily  . atorvastatin  20 mg Oral q1800  . benazepril  40 mg Oral Daily  . ferrous sulfate  325 mg Oral Q breakfast  . Influenza vac split quadrivalent PF  0.5 mL Intramuscular Tomorrow-1000  . insulin aspart  0-15 Units Subcutaneous TID WC  . insulin aspart  0-5 Units Subcutaneous QHS  .  insulin glargine  6 Units Subcutaneous Daily   Continuous Infusions: . diltiazem (CARDIZEM) infusion 15 mg/hr (03/22/17 0720)     LOS: 2 days     Tanna Furry, MD [facility] Pager [phone]  If 7PM-7AM, please contact night-coverage [email or web address] Password [email or web address] 03/22/2017, 11:09 AM

## 2017-03-22 NOTE — Progress Notes (Signed)
CRITICAL VALUE ALERT  Critical Value:  Troponin .0.04  Date & Time Notied:  03/22/2017 0140  Provider Notified: Dr Myna Hidalgo  Orders Received/Actions taken:

## 2017-03-22 NOTE — Progress Notes (Signed)
  Subjective: He feels better.  Thinks that hematoma is smaller and less painful. In atrial fibrillation this morning with controlled rate Hemoglobin 7.7, unchanged.  INR 1.11  Objective: Vital signs in last 24 hours: Temp:  [98.1 F (36.7 C)-98.5 F (36.9 C)] 98.3 F (36.8 C) (09/28 0330) Pulse Rate:  [83-128] 93 (09/28 0330) Resp:  [16-27] 19 (09/28 0330) BP: (107-165)/(44-81) 115/63 (09/28 0330) SpO2:  [94 %-98 %] 97 % (09/28 0330) Last BM Date: 03/19/17  Intake/Output from previous day: 09/27 0701 - 09/28 0700 In: 482 [P.O.:120; Blood:362] Out: 1725 [Urine:1725] Intake/Output this shift: Total I/O In: -  Out: 400 [Urine:400]  General appearance: Alert.  No distress.  Family member in room.  Nurse in room. Chest wall: no tenderness, Hematoma right pectoral area stable.  Not bigger.  Clearly softer.  No axillary mass.  Neurovascular exam intact right upper extremity.  No sensory deficits.  Good radial pulse.  Lab Results:   Recent Labs  03/21/17 1937 03/22/17 0106  WBC 10.5 10.6*  HGB 7.6* 7.7*  HCT 22.3* 22.6*  PLT 131* 126*   BMET  Recent Labs  03/21/17 0310 03/22/17 0106  NA 134* 134*  K 3.9 3.8  CL 104 103  CO2 24 25  GLUCOSE 200* 178*  BUN 14 21*  CREATININE 1.26* 1.35*  CALCIUM 7.8* 7.8*   PT/INR  Recent Labs  03/21/17 0310 03/22/17 0106  LABPROT 15.2 14.3  INR 1.21 1.11   ABG No results for input(s): PHART, HCO3 in the last 72 hours.  Invalid input(s): PCO2, PO2  Studies/Results: No results found.  Anti-infectives: Anti-infectives    None      Assessment/Plan:   Spontaneous hemorrhage right pectoral area secondary to supratherapeutic INR and Coumadin No evidence of active bleeding INR corrected Hematoma stable and possibly smaller and softer. No indication for surgical intervention Continue chest binder and intermittent ice pack Anticipate hematoma will spontaneously resolve over next month  Surgery will sign off.   Please reconsult if surgical issues arise   LOS: 2 days    Coleby Yett M 03/22/2017

## 2017-03-22 NOTE — Plan of Care (Signed)
Problem: Pain Managment: Goal: General experience of comfort will improve Outcome: Progressing Still having soreness upon movement in right chest area  Problem: Physical Regulation: Goal: Ability to maintain clinical measurements within normal limits will improve Outcome: Not Progressing Went into rapid Atrial fibrillaiton. controlling rate with cardizem drip  Problem: Skin Integrity: Goal: Risk for impaired skin integrity will decrease Outcome: Progressing Hematoma getting smaller, iced and pressure applied  Problem: Activity: Goal: Risk for activity intolerance will decrease Outcome: Not Progressing Static with activity

## 2017-03-22 NOTE — Plan of Care (Signed)
Problem: Physical Regulation: Goal: Will remain free from infection Outcome: Progressing Increase in coughing, PCXR ordered to check for fluids vs infilitrate

## 2017-03-22 NOTE — Progress Notes (Signed)
Patient somnolent, will awaken briefly to answer questions, assisted with turning. Increasing in coughing, congested, non productive, increase in O2 demand. Hosptialist service notifed, orders for CXR received. Continue to monitor I&o. Wife at bedside.

## 2017-03-22 NOTE — Progress Notes (Signed)
Heart rate increasing again to 120's to 130's. MD paged for further orders. Nonsymptomatic. Placed on o2 n/c earlier when afib started as o2 sats decreased to 91%. On 2L.

## 2017-03-22 NOTE — Progress Notes (Signed)
Patient lethargic not following commands pt. VSS with no s/s of distress.  Patients movement is equal bi-laterally.  Updated Dr. Carolin Sicks on patients changes, MD to place orders.  Will continue to monitor.

## 2017-03-22 NOTE — Consult Note (Signed)
Cardiology Consultation:   Patient ID: Bryan Carney; [id number]; February 16, 1941   Admit date: 03/20/2017 Date of Consult: 03/22/2017  Primary Care Provider: [name], NP Primary Cardiologist: Dr. [name]    Patient Profile:   Bryan Carney is a 76 y.o. male with a hx of CAD s/p CABG 1999, left CEA 1996, PVD, diabetes, hypertension, hyperlipidemia, history of PE in 1990s on Coumadin who is being seen today for the evaluation of atrial fibrillation at the request of Dr. Carolin Sicks.  History of Present Illness:   Bryan Carney presented with right chest wall swelling, bruises and hematoma. In the ER he was noted to have a hemoglobin of 6.4 with INR 4.8. He received FFP, vitamin K and 3 units of blood. INR is 1.1 today. Her previous PE was in 1999 in the setting of CABG surgery. Coumadin is currently on hold. However, the patient did develop atrial fibrillation with RVR in the 120's last night. He was placed on diltiazem for rate control. He was asleep when this occurred. He had no chest pain or shortness of breath.  The patient continues to work over 60 hours per week, He worked 12 hours on Monday and when he was in the shower that night he found a bruise on his right chest. He was unaware of any injury that had occurred. He went to the ED that evening to have it assessed and everything looks stable so he was discharged.On Tuesday the patient became syncopal and the family was unable to get him off the floor without him passing out so they called 911. By the time he was in the ED his bruising of the chest was much worse.  The family reports that the hematoma looks considerably smaller today. The patient is sleeping as the family reports that he was awake all last night. Patient has no chest pain, shortness of breath, orthopnea, edema.  Patient was last seen in our office on 05/09/2016 by Dr. [name] at which time he was too unwell and was asymptomatic. He has had no cardiac issues since his bypass surgery  in 1999.  Significant findings: Troponins 0.04, 0.04, 0.04 Serum creatinine 1.35 Hemoglobin 6.4 on admission, 7.7 today after transfusion TSH 1.276 EKG atrial fibrillation with rapid ventricular response rate of 116 bpm, inferolateral ST changes possibly related to atrial fibrillation  Past Medical History:  Diagnosis Date  . CAD (coronary artery disease)   . Diabetes ([place])   . Hx pulmonary embolism   . Hyperlipidemia   . Hypertension   . Prostate cancer ([place])   . PVOD (pulmonary veno-occlusive disease) ([place])   . Squamous cell carcinoma     Past Surgical History:  Procedure Laterality Date  . BACK SURGERY    . CARDIAC CATHETERIZATION  10/15/1997   Recommend complete revascularization by CABG  . CARDIOVASCULAR STRESS TEST  06/12/2012   No evidence of ischemia  . CAROTID DOPPLER  08/27/2012   Rt bulb/proximal ICA demonstrated a mild-moderate amount of fibrous plaque without evidence of a significant diameter reduction or any other vascular abnormality.  . CAROTID ENDARTERECTOMY Left 02/23/1995  . CORONARY ARTERY BYPASS GRAFT  10/19/1997   x5. LIMA-LAD, SVG-PDA, SVG-OM1, seq SVG-fist and second branches of ramus intermedius.     Home Medications:  Prior to Admission medications   Medication Sig Start Date End Date Taking? Authorizing Provider  amitriptyline (ELAVIL) 10 MG tablet Take 10 mg by mouth at bedtime.   Yes [provider]  amLODipine (NORVASC) 5 MG  tablet Take 5 mg by mouth at bedtime.    Yes [provider]  aspirin 325 MG tablet Take 325 mg by mouth daily.   Yes [provider]  benazepril (LOTENSIN) 40 MG tablet Take 40 mg by mouth daily.   Yes [provider]  ferrous sulfate 325 (65 FE) MG tablet Take 325 mg by mouth daily with breakfast.    Yes [provider]  KOMBIGLYZE XR 10-998 MG TB24 Take 1 tablet by mouth daily.  09/26/15  Yes [provider]  simvastatin (ZOCOR) 40 MG tablet Take 40 mg by mouth daily.    Yes [provider]  warfarin (COUMADIN) 5 MG tablet Take 1.5 to 2 tablets by mouth daily as directed by coumadin clinic Patient taking differently: Take 7.5-10 mg by mouth See admin instructions. Take 10 mg on Thursday and Friday then take 7.5 mg all there other days 06/21/15  Yes Lorretta Harp, MD  HYDROcodone-acetaminophen (NORCO/VICODIN) 5-325 MG tablet Take 1 tablet by mouth every 6 (six) hours as needed. 03/19/17   Merryl Hacker, MD    Inpatient Medications: Scheduled Meds: . amitriptyline  10 mg Oral QHS  . atorvastatin  20 mg Oral q1800  . benazepril  40 mg Oral Daily  . diltiazem  30 mg Oral Q12H  . ferrous sulfate  325 mg Oral Q breakfast  . Influenza vac split quadrivalent PF  0.5 mL Intramuscular Tomorrow-1000  . insulin aspart  0-15 Units Subcutaneous TID WC  . insulin aspart  0-5 Units Subcutaneous QHS  . insulin glargine  6 Units Subcutaneous Daily   Continuous Infusions:  PRN Meds: ondansetron **OR** ondansetron (ZOFRAN) IV, oxyCODONE  Allergies:    Allergies  Allergen Reactions  . Lopressor [Metoprolol Tartrate] Other (See Comments)    Severe BP drop    Social History:   Social History   Social History  . Marital status: Married    Spouse name: N/A  . Number of children: N/A  . Years of education: N/A   Occupational History  . Not on file.   Social History Main Topics  . Smoking status: Never Smoker  . Smokeless tobacco: Never Used  . Alcohol use No  . Drug use: Unknown  . Sexual activity: Not on file   Other Topics Concern  . Not on file   Social History Narrative  . No narrative on file    Family History:    Family History  Problem Relation Age of Onset  . Heart attack Father   . Cancer Sister   . Diabetes Brother   . Cancer Sister   . Diabetes Brother   . Diabetes Brother   . Diabetes Brother      ROS:  Please see the history of present illness.  ROS  All other ROS reviewed and negative.     Physical  Exam/Data:   Vitals:   03/22/17 0000 03/22/17 0330 03/22/17 0700 03/22/17 1100  BP: (!) 107/44 115/63 (!) 124/51 (!) 121/52  Pulse: (!) 119 93 82 73  Resp: 19 19 (!) 22 (!) 22  Temp:  98.3 F (36.8 C) 98.2 F (36.8 C) 98.1 F (36.7 C)  TempSrc:  Oral Oral Oral  SpO2: 98% 97% 97% 96%  Weight:      Height:        Intake/Output Summary (Last 24 hours) at 03/22/17 1452 Last data filed at 03/22/17 1100  Gross per 24 hour  Intake  125.29 ml  Output             1400 ml  Net         -1274.71 ml   Filed Weights   03/20/17 1616  Weight: 138 lb 14.2 oz (63 kg)   Body mass index is 22.42 kg/m.  General:  Thin male in no acute distress HEENT: normal Lymph: no adenopathy Neck: no JVD Endocrine:  No thryomegaly Vascular: No carotid bruits; FA pulses 2+ bilaterally without bruits  Cardiac:  Irregularly irregular rhythm, normal rate, 1-3/0 systolic murmur in the left lateral chest Lungs:  clear to auscultation bilaterally, no wheezing, rhonchi or rales  Abd: soft, nontender, no hepatomegaly  Ext: no edema Musculoskeletal:  No deformities, BUE and BLE strength normal and equal Skin: warm and dry  Neuro:  CNs 2-12 intact, no focal abnormalities noted Psych:  Normal affect   EKG:  The EKG was personally reviewed and demonstrates:  atrial fibrillation with rapid ventricular response rate of 116 bpm, inferolateral ST changes possibly related to atrial fibrillation Telemetry:  Telemetry was personally reviewed and demonstrates:  Atrial fibrillation with rates in the 70s  Relevant CV Studies:  Echocardiogram 05/31/16 Study Conclusions - Left ventricle: The cavity size was normal. Wall thickness was   increased in a pattern of mild LVH. The estimated ejection   fraction was 55%. Wall motion was normal; there were no regional   wall motion abnormalities. Features are consistent with a   pseudonormal left ventricular filling pattern, with concomitant   abnormal relaxation and  increased filling pressure (grade 2   diastolic dysfunction). - Aortic valve: Trileaflet; moderately calcified leaflets. There   was mild stenosis. Mean gradient (S): 13 mm Hg. Valve area   (Vmax): 1.56 cm^2. - Aorta: Mildly dilated aortic root and ascending aorta. Aortic   root dimension: 37 mm (ED). Ascending aortic diameter: 37 mm (S). - Mitral valve: Mildly calcified annulus. There was mild   regurgitation. - Left atrium: The atrium was moderately dilated. - Right ventricle: The cavity size was normal. Systolic function   was mildly reduced. - Tricuspid valve: Peak RV-RA gradient (S): 20 mm Hg. - Pulmonary arteries: PA peak pressure: 23 mm Hg (S). - Inferior vena cava: The vessel was normal in size. The   respirophasic diameter changes were in the normal range (= 50%),   consistent with normal central venous pressure.  Impressions: - Normal LV size with EF 55%. Mild LV hypertrophy. Moderate   diastolic dysfunction. Mild mitral regurgitation. Normal RV size   with mildly decreased systolic function.   Laboratory Data:  Chemistry Recent Labs Lab 03/20/17 0315 03/21/17 0310 03/22/17 0106  NA 132* 134* 134*  K 4.5 3.9 3.8  CL 105 104 103  CO2 21* 24 25  GLUCOSE 340* 200* 178*  BUN 20 14 21*  CREATININE 1.52* 1.26* 1.35*  CALCIUM 7.3* 7.8* 7.8*  GFRNONAA 42* 53* 49*  GFRAA 49* >60 57*  ANIONGAP 6 6 6      Recent Labs Lab 03/18/17 1933  PROT 5.9*  ALBUMIN 4.0  AST 21  ALT 13*  ALKPHOS 68  BILITOT 1.5*   Hematology Recent Labs Lab 03/21/17 1137 03/21/17 1937 03/22/17 0106  WBC 10.8* 10.5 10.6*  RBC 2.49* 2.46* 2.48*  HGB 7.7* 7.6* 7.7*  HCT 22.5* 22.3* 22.6*  MCV 90.4 90.7 91.1  MCH 30.9 30.9 31.0  MCHC 34.2 34.1 34.1  RDW 15.1 14.8 14.7  PLT 123* 131* 126*   Cardiac Enzymes Recent  Labs Lab 03/22/17 0106 03/22/17 0543 03/22/17 1214  TROPONINI 0.04* 0.04* 0.04*   No results for input(s): TROPIPOC in the last 168 hours.  BNPNo results for  input(s): BNP, PROBNP in the last 168 hours.  DDimer No results for input(s): DDIMER in the last 168 hours.  Radiology/Studies:  Dg Chest 2 View  Result Date: 03/18/2017 CLINICAL DATA:  Bruising and swelling right upper and lateral chest. EXAM: CHEST  2 VIEW COMPARISON:  10/20/2015 FINDINGS: AP and lateral sitting views were obtained. The lungs are clear without focal pneumonia, edema, pneumothorax or pleural effusion. The cardiopericardial silhouette is within normal limits for size. Patient is status post CABG The visualized bony structures of the thorax are intact. IMPRESSION: No active cardiopulmonary disease. Electronically Signed   By: Misty Stanley M.D.   On: 03/18/2017 20:23   Ct Chest W Contrast  Result Date: 03/19/2017 CLINICAL DATA:  Chest wall pain and bruising, progressive. On anticoagulation. EXAM: CT CHEST WITH CONTRAST TECHNIQUE: Multidetector CT imaging of the chest was performed during intravenous contrast administration. CONTRAST:  65mL ISOVUE-300 IOPAMIDOL (ISOVUE-300) INJECTION 61% COMPARISON:  Radiograph yesterday. FINDINGS: Cardiovascular: Post CABG with atherosclerosis of native coronary arteries. No aortic dissection. Ascending thoracic aorta measures 3.4 cm with small atherosclerosis. Cannot assess for pulmonary embolus given phase of visualized thyroid gland is normal. No pericardial effusion. Contrast. Borderline heart size. Questionable right subclavian/axillary venous stenosis or occlusion with collaterals in the right axilla. Mediastinum/Nodes: No adenopathy. The esophagus is decompressed. The visualized thyroid gland is normal. Lungs/Pleura: Mild breathing motion artifact at the lung bases. Mild dependent atelectasis. No consolidation, pulmonary edema or pleural fluid. Upper Abdomen: Diminished enhancement of the right kidney with occlusion of the right renal artery at its origin, some distal arterial reconstitution. Right renal atrophy seen on prior abdominal CT.  Musculoskeletal: Heterogeneous enlargement of the right pectoralis muscle and poorly defined subpectoral soft tissues consistent with hematoma. No active extravasation. Soft tissue stranding extends inferiorly on the chest wall. No rib fracture or evidence of acute osseous abnormality. Degenerative change in the thoracic spine with vacuum phenomenon and endplate spurring. IMPRESSION: 1. Large right pectoralis/sub pectoralis chest wall hematoma. No active extravasation. 2. Dependent atelectasis in the lung bases, otherwise no acute intrathoracic abnormality. 3. Aortic atherosclerosis. Post CABG with native coronary artery calcifications. 4. Incidentally noted in the upper abdomen right renal atrophy and diminished enhancement. There is occlusion of the right renal artery at the origin, with some distal reconstitution of arterial flow. This is likely chronic as right renal atrophy was seen on prior noncontrast abdominal CT. 5. Probable right subclavian venous stenosis or occlusion with chest wall collaterals, suggesting this is chronic. Aortic Atherosclerosis (ICD10-I70.0). Electronically Signed   By: Jeb Levering M.D.   On: 03/19/2017 04:58    Assessment and Plan:   1. Atrial fibrillation with RVR: This is in the setting of blood loss anemia with hemoglobin of 6.4, related to supratherapeutic INR while on Coumadin and chest wall hematoma.He has no history of A. Fib. He was given Cardizem 10 mg IV followed by Cardizem drip. His heart rate was dropping into the 50s and diltiazem drip has been stopped, switch to oral diltiazem 30 mg every 12 hours. Now has good rate control with rates in this 70s. He has an intolerance to metoprolol in the past which caused severe blood pressure drop. We'll need to watch his response to Cardizem closely. Blood pressure is currently stable. He was previously on Coumadin for a PE from 1999.  This is now on hold due to his chest hematoma and blood loss anemia. His CHA2DS2/VAS  Stroke Risk Score is 5 (HTN, age (2), DM, Vasc dz). He will need to be anticoagulated for stroke risk reduction. His daughter is requesting consideration of a DOAC instead of Coumadin. Serum creatinine is 1.35. Recommend Eliquis 5 mg twice a day (patient is near 76 years of age and weight is 63 kg so he may need dose reduction in the near future)    2. Essential hypertension: home medications include amlodipine 5 mg, benazepril 40 mg. Blood pressures were initially elevated, better today.   3. Hyperlipidemia: lipids followed by PCP. Continue statin  4. CAD: history of CABG in 1999. His last Myoview was performed in 05/2012 and was nonischemic. He is treated with aspirin 325 mg and statin. He is intolerant to metoprolol.   5. Mitral regurgitation: mild MR by echo 05/31/2016. Patient has a significant murmur in the left lateral chest. A repeat echocardiogram has been ordered.  For questions or updates, please contact [place] Please consult [email or web address] for contact info under Cardiology/STEMI.   Signed, Daune Perch, NP  03/22/2017 2:52 PM

## 2017-03-23 ENCOUNTER — Inpatient Hospital Stay (HOSPITAL_COMMUNITY): Payer: Medicare Other

## 2017-03-23 LAB — URINALYSIS, ROUTINE W REFLEX MICROSCOPIC
BILIRUBIN URINE: NEGATIVE
Bacteria, UA: NONE SEEN
GLUCOSE, UA: NEGATIVE mg/dL
KETONES UR: NEGATIVE mg/dL
LEUKOCYTES UA: NEGATIVE
NITRITE: NEGATIVE
PROTEIN: 30 mg/dL — AB
Specific Gravity, Urine: 1.014 (ref 1.005–1.030)
Squamous Epithelial / LPF: NONE SEEN
pH: 5 (ref 5.0–8.0)

## 2017-03-23 LAB — CBC
HEMATOCRIT: 21.3 % — AB (ref 39.0–52.0)
HEMOGLOBIN: 7.2 g/dL — AB (ref 13.0–17.0)
MCH: 30.9 pg (ref 26.0–34.0)
MCHC: 33.8 g/dL (ref 30.0–36.0)
MCV: 91.4 fL (ref 78.0–100.0)
Platelets: 174 10*3/uL (ref 150–400)
RBC: 2.33 MIL/uL — AB (ref 4.22–5.81)
RDW: 14.3 % (ref 11.5–15.5)
WBC: 8.4 10*3/uL (ref 4.0–10.5)

## 2017-03-23 LAB — GLUCOSE, CAPILLARY
GLUCOSE-CAPILLARY: 191 mg/dL — AB (ref 65–99)
GLUCOSE-CAPILLARY: 324 mg/dL — AB (ref 65–99)
GLUCOSE-CAPILLARY: 212 mg/dL — AB (ref 65–99)
Glucose-Capillary: 112 mg/dL — ABNORMAL HIGH (ref 65–99)

## 2017-03-23 LAB — HEMOGLOBIN AND HEMATOCRIT, BLOOD
HCT: 25 % — ABNORMAL LOW (ref 39.0–52.0)
Hemoglobin: 8.3 g/dL — ABNORMAL LOW (ref 13.0–17.0)

## 2017-03-23 LAB — PREPARE RBC (CROSSMATCH)

## 2017-03-23 MED ORDER — DILTIAZEM HCL 25 MG/5ML IV SOLN
15.0000 mg | Freq: Four times a day (QID) | INTRAVENOUS | Status: AC
  Administered 2017-03-23 – 2017-03-24 (×3): 15 mg via INTRAVENOUS
  Filled 2017-03-23 (×5): qty 5

## 2017-03-23 MED ORDER — DILTIAZEM HCL 30 MG PO TABS
30.0000 mg | ORAL_TABLET | Freq: Four times a day (QID) | ORAL | Status: DC
  Administered 2017-03-23: 30 mg via ORAL
  Filled 2017-03-23: qty 1

## 2017-03-23 MED ORDER — DILTIAZEM HCL 30 MG PO TABS
30.0000 mg | ORAL_TABLET | Freq: Four times a day (QID) | ORAL | Status: DC
  Administered 2017-03-24 – 2017-03-25 (×4): 30 mg via ORAL
  Filled 2017-03-23 (×4): qty 1

## 2017-03-23 MED ORDER — SODIUM CHLORIDE 0.9 % IV SOLN
Freq: Once | INTRAVENOUS | Status: AC
  Administered 2017-03-23: 10:00:00 via INTRAVENOUS

## 2017-03-23 NOTE — Progress Notes (Signed)
Pt very somnolent, will open eyes on command, but confused to time and situation.  Pt falls back asleep as soon as stimuli has ended, unable to follow commands such as drinking or using urinal.  Family at bedside, states he did this last pm but has no history of this at home.  Emotional support given to family.  Dr. Myna Hidalgo paged.  Cardizem changed to IV.  Will bladder scan and  place condom catheter. If no void will contact Dr. Myna Hidalgo for further orders.   Family concerned re thick cough that started yesterday.  CXR done this am.   Will continue to monitor patient closely through night.

## 2017-03-23 NOTE — Progress Notes (Signed)
Called due to the patient having recurrent bouts of atrial fibrillation. HR mostly in the 90's, peaking into the 110's. Will adjust Cardizem dosing from 30mg  Q12H to 30mg  Q6H and give a dose now. Continue to correct underlying anemia.   Signed, Erma Heritage, PA-C 03/23/2017, 2:56 PM Pager: [phone]

## 2017-03-23 NOTE — Progress Notes (Signed)
PROGRESS NOTE    ARLESTER KEEHAN  FWY:[id number] DOB: Apr 15, 1941 DOA: 03/20/2017 PCP: [name], NP   Brief Narrative: 76 year old male with history of coronary artery disease, peripheral vascular disease, diabetes, hypertension, history of PE in 1990s on Coumadin presented with right chest wall swelling, bruises and hematoma. In the ER patient with hemoglobin of 6.4 with INR 4.8. Received FFP, noted blood cell and vitamin K. Admitted for further evaluation.  Assessment & Plan:  # Spontaneous right chest wall hematoma in the setting of Coumadin: -Patient received FFP and vitamin K in the ER. INR 1.1. -Hemoglobin is 7.2 likely equilibrated now. Patient is pale and feels weak. Plan to transfuse a unit of red blood cell transfusion because of history of cardiac disease. Discussed with the patient and his wife at bedside. Monitor CBC. -Patient was evaluated by general surgery and IR. Continue chest binder and conservative management.   Patient denied chest pain or shortness of breath.  # Acute blood loss anemia: Received 1 unit of blood for transfusion on admission and planned for another transfusion today. Monitor CBC. Continue oral iron.  #History of PE on Coumadin: Currently Coumadin on hold. No known history of DVT. PE was in 1999 in the setting of CABG surgery.  Supratherapeutic INR on admission. No need for Coumadin for PE.  # New onset atrial fibrillation with RVR: Required a Cardizem drip and currently on oral with rate controlled. Follow-up echocardiogram. May need an increase of her bleeding stopped. Follow-up cardiology.  #Diabetes type 2: Continue sliding scale and low-dose Lantus. Monitor blood sugar level.  #History of coronary artery disease: Continue Zocor. May be able to resume aspirin in few days when hematoma improves. -Patient also with mild elevation in troponin level. Cardiology is following  #Hypertension: Blood pressure elevated. Continue Lotensin and  norvasc.  #Dyslipidemia: Continue Zocor.  #Acute kidney injury: Serum creatinine level stable. Unknown patient's baseline serum creatinine level. He may have chronic kidney disease. 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 Recommend outpatient follow-up. -Monitor BMP. Avoid nephrotoxins  PT OT evaluation.  DVT prophylaxis: SCD Code Status: Full code Family Communication: Discussed with patient's wife at bedside. Disposition Plan: Current in the stepdown    Consultants:   Pulmonologist  General surgery   IR  Cardiology  Procedures: None Antimicrobials: None  Subjective: Seen and examined at bedside. Feels weak tired. Denied headache, dizziness, nausea vomiting chest pain. Objective: Vitals:   03/23/17 0605 03/23/17 0728 03/23/17 1055 03/23/17 1115  BP:  (!) 135/57 110/74 110/74  Pulse:  82 74 70  Resp:  (!) 21 (!) 24 (!) 21  Temp:  98.3 F (36.8 C) 97.7 F (36.5 C) 97.9 F (36.6 C)  TempSrc:  Oral Oral Oral  SpO2:  97% 98%   Weight: 64.9 kg (143 lb 1.3 oz)     Height:        Intake/Output Summary (Last 24 hours) at 03/23/17 1130 Last data filed at 03/23/17 1030  Gross per 24 hour  Intake              240 ml  Output             1275 ml  Net            -1035 ml   Filed Weights   03/20/17 1616 03/23/17 0605  Weight: 63 kg (138 lb 14.2 oz) 64.9 kg (143 lb 1.3 oz)    Examination:  General exam: Not in distress, Sitting on bed comfortable, pale Respiratory system: Lungs clear bilaterally, no wheezing or crackle. Right chest wall bruises and has chest binder, unchanged Cardiovascular system: Regular rate and rhythm, S1-S2 normal. No pedal edema. Gastrointestinal system: Abdomen soft, nontender, nondistended. Bowel sound positive.. Central nervous system: Alert awake and oriented. Skin: No rashes, lesions or ulcers Psychiatry: Judgement and insight appear normal. Mood & affect appropriate.      Data Reviewed: I have personally reviewed following labs and imaging studies  CBC:  Recent Labs Lab 03/21/17 0310 03/21/17 1137 03/21/17 1937 03/22/17 0106 03/23/17 0255  WBC 11.0* 10.8* 10.5 10.6* 8.4  HGB 6.0* 7.7* 7.6* 7.7* 7.2*  HCT 17.8* 22.5* 22.3* 22.6* 21.3*  MCV 91.8 90.4 90.7 91.1 91.4  PLT 112* 123* 131* 126* 379   Basic Metabolic Panel:  Recent Labs Lab 03/18/17 1933 03/20/17 0315 03/21/17 0310 03/22/17 0106  NA 137 132* 134* 134*  K 4.5 4.5 3.9 3.8  CL 108 105 104 103  CO2 24 21* 24 25  GLUCOSE 197* 340* 200* 178*  BUN 23* 20 14 21*  CREATININE 1.59* 1.52* 1.26* 1.35*  CALCIUM 8.8* 7.3* 7.8* 7.8*  MG  --   --   --  1.9   GFR: Estimated Creatinine Clearance: 41.4 mL/min (A) (by C-G formula based on SCr of 1.35 mg/dL (H)). Liver Function Tests:  Recent Labs Lab 03/18/17 1933  AST 21  ALT 13*  ALKPHOS 68  BILITOT 1.5*  PROT 5.9*  ALBUMIN 4.0   No results for input(s): LIPASE, AMYLASE in the last 168 hours.  Recent Labs Lab 03/22/17 1614  AMMONIA 17   Coagulation Profile:  Recent Labs Lab 03/18/17 1933 03/20/17 0331 03/20/17 1954 03/21/17 0310 03/22/17 0106  INR 3.06 4.82* 1.28 1.21 1.11   Cardiac Enzymes:  Recent Labs Lab 03/22/17 0106 03/22/17 0543 03/22/17 1214  TROPONINI 0.04* 0.04* 0.04*   BNP (last 3 results) No results for input(s): PROBNP in the last 8760 hours. HbA1C: No results for input(s): HGBA1C in the last 72 hours. CBG:  Recent Labs Lab 03/22/17 0740 03/22/17 1127 03/22/17 1542 03/22/17 2140 03/23/17 0733  GLUCAP 189* 291* 177* 304* 191*   Lipid Profile: No results for input(s): CHOL, HDL, LDLCALC, TRIG, CHOLHDL, LDLDIRECT in the last 72 hours. Thyroid Function Tests:  Recent Labs  03/22/17 0106  TSH 1.276   Anemia Panel: No results for input(s): VITAMINB12, FOLATE, FERRITIN, TIBC, IRON, RETICCTPCT in the last 72 hours. Sepsis Labs: No results for input(s): PROCALCITON, LATICACIDVEN  in the last 168 hours.  Recent Results (from the past 240 hour(s))  MRSA PCR Screening     Status: None   Collection Time: 03/20/17  5:08 PM  Result Value Ref Range Status   MRSA by PCR NEGATIVE NEGATIVE Final    Comment:        The GeneXpert MRSA Assay (FDA approved for NASAL specimens only), is one component of a comprehensive MRSA colonization surveillance program. It is not intended to diagnose MRSA infection nor to guide or monitor treatment for MRSA infections.          Radiology Studies: Dg Chest Port 1 View  Result Date: 03/23/2017 CLINICAL DATA:  Hypoxia this evening EXAM: PORTABLE CHEST 1 VIEW COMPARISON:  03/19/2017 chest CT FINDINGS: Soft tissue prominence over the anterior and lateral right chest wall consistent with known right-sided pectoralis hematoma. Slightly low lung volumes with crowding of interstitial lung markings. Faint bibasilar opacities are seen consistent with atelectasis.  Slightly more confluent opacities seen at the right lung base however and a superimposed pneumonia is not excluded. The patient is status post CABG. Heart is normal in size with aortic atherosclerosis. No overt pulmonary edema. No acute osseous appearing abnormality. IMPRESSION: 1. Low lung volumes with crowding of interstitial lung markings. Hazy opacity at the right lung base cannot exclude a subtle pneumonia and atelectasis. 2. No overt pulmonary edema. 3. Soft tissue swelling about the right chest wall consistent with known pectoralis hematoma. Electronically Signed   By: Ashley Royalty M.D.   On: 03/23/2017 02:54        Scheduled Meds: . amitriptyline  10 mg Oral QHS  . atorvastatin  20 mg Oral q1800  . benazepril  40 mg Oral Daily  . diltiazem  30 mg Oral Q12H  . ferrous sulfate  325 mg Oral Q breakfast  . Influenza vac split quadrivalent PF  0.5 mL Intramuscular Tomorrow-1000  . insulin aspart  0-15 Units Subcutaneous TID WC  . insulin aspart  0-5 Units Subcutaneous QHS  .  insulin glargine  6 Units Subcutaneous Daily   Continuous Infusions: . sodium chloride       LOS: 3 days    Calena Salem Tanna Furry, MD [facility] Pager [phone]  If 7PM-7AM, please contact night-coverage [email or web address] Password [email or web address] 03/23/2017, 11:30 AM

## 2017-03-23 NOTE — Progress Notes (Signed)
Patient in sinus rhythm today. Bryan Carney likely need long term anticoagulation. Would restart when hematoma stable. Follow up in cardiology clinic.   Peyson Delao [name], MD 03/23/2017 11:36 AM

## 2017-03-23 NOTE — Progress Notes (Signed)
Patient converted to A-fib HR increased into the 120s-130s for a few seconds and dropped down into the 90's to 100's.  Pt. Asymptomatic throughout this process.  Dr. Carolin Sicks updated on patients change in condition, directed to contacted cardiology.  PA Strader contacted updated on patients condition, PA to place order.  Will continue to monitor.

## 2017-03-23 NOTE — Progress Notes (Signed)
Still sleepy, using IS when awake to 1011ml. Drinking liquids and tolerating well. Observed some apnic periods, short in duration < 15 seconds. Wife states has periods of apnea at home normally.

## 2017-03-24 ENCOUNTER — Inpatient Hospital Stay (HOSPITAL_COMMUNITY): Payer: Medicare Other

## 2017-03-24 ENCOUNTER — Other Ambulatory Visit (HOSPITAL_COMMUNITY): Payer: Medicare Other

## 2017-03-24 LAB — CBC
HEMATOCRIT: 24.5 % — AB (ref 39.0–52.0)
HEMOGLOBIN: 8.2 g/dL — AB (ref 13.0–17.0)
MCH: 30.6 pg (ref 26.0–34.0)
MCHC: 33.5 g/dL (ref 30.0–36.0)
MCV: 91.4 fL (ref 78.0–100.0)
Platelets: 192 10*3/uL (ref 150–400)
RBC: 2.68 MIL/uL — AB (ref 4.22–5.81)
RDW: 14.7 % (ref 11.5–15.5)
WBC: 7.2 10*3/uL (ref 4.0–10.5)

## 2017-03-24 LAB — ECHOCARDIOGRAM COMPLETE
AV Area VTI index: 0.88 cm2/m2
AV Area mean vel: 1.4 cm2
AV Mean grad: 17 mmHg
AV area mean vel ind: 0.81 cm2/m2
AV peak Index: 0.9
AVA: 1.52 cm2
AVAREAVTI: 1.56 cm2
AVCELMEANRAT: 0.34
AVPG: 29 mmHg
AVPKVEL: 269 cm/s
Ao pk vel: 0.38 m/s
CHL CUP AV VALUE AREA INDEX: 0.88
CHL CUP AV VEL: 1.52
CHL CUP DOP CALC LVOT VTI: 17.1 cm
DOP CAL AO MEAN VELOCITY: 190 cm/s
FS: 17 % — AB (ref 28–44)
HEIGHTINCHES: 66 in
IV/PV OW: 0.98
LA diam end sys: 41 mm
LA diam index: 2.37 cm/m2
LASIZE: 41 mm
LAVOL: 75 mL
LAVOLA4C: 70.3 mL
LAVOLIN: 43.4 mL/m2
LV PW d: 12 mm — AB (ref 0.6–1.1)
LVOT SV: 71 mL
LVOT area: 4.15 cm2
LVOT peak vel: 101 cm/s
LVOTD: 23 mm
LVOTVTI: 0.37 cm
VTI: 46.6 cm
WEIGHTICAEL: 2289.26 [oz_av]

## 2017-03-24 LAB — TYPE AND SCREEN
ABO/RH(D): O NEG
Antibody Screen: NEGATIVE
UNIT DIVISION: 0
Unit division: 0
Unit division: 0

## 2017-03-24 LAB — BPAM RBC
BLOOD PRODUCT EXPIRATION DATE: 201810122359
BLOOD PRODUCT EXPIRATION DATE: 201810162359
BLOOD PRODUCT EXPIRATION DATE: 201810172359
ISSUE DATE / TIME: 201809260812
ISSUE DATE / TIME: 201809270431
ISSUE DATE / TIME: 201809291104
UNIT TYPE AND RH: 9500
Unit Type and Rh: 9500
Unit Type and Rh: 9500

## 2017-03-24 LAB — GLUCOSE, CAPILLARY
GLUCOSE-CAPILLARY: 295 mg/dL — AB (ref 65–99)
GLUCOSE-CAPILLARY: 232 mg/dL — AB (ref 65–99)
GLUCOSE-CAPILLARY: 222 mg/dL — AB (ref 65–99)
Glucose-Capillary: 273 mg/dL — ABNORMAL HIGH (ref 65–99)

## 2017-03-24 MED ORDER — DM-GUAIFENESIN ER 30-600 MG PO TB12
1.0000 | ORAL_TABLET | Freq: Two times a day (BID) | ORAL | Status: DC
  Administered 2017-03-24 – 2017-03-27 (×7): 1 via ORAL
  Filled 2017-03-24 (×7): qty 1

## 2017-03-24 NOTE — Progress Notes (Signed)
  Echocardiogram 2D Echocardiogram has been performed.  Johny Chess 03/24/2017, 12:47 PM

## 2017-03-24 NOTE — Progress Notes (Addendum)
PROGRESS NOTE    Bryan Carney  WHQ:[id number] DOB: 1941/04/20 DOA: 03/20/2017 PCP: [name], NP   Brief Narrative: 76 year old male with history of coronary artery disease, peripheral vascular disease, diabetes, hypertension, history of PE in 1990s on Coumadin presented with right chest wall swelling, bruises and hematoma. In the ER patient with hemoglobin of 6.4 with INR 4.8. Received FFP, noted blood cell and vitamin K. Admitted for further evaluation.  Assessment & Plan:  # Spontaneous right chest wall hematoma in the setting of Coumadin: -Patient received FFP and vitamin K in the ER. INR 1.1. -Patient received a unit of blood transfusion yesterday. Hemoglobin 8.2. --Patient was evaluated by general surgery and IR. Continue chest binder and conservative management.   Patient denied chest pain or shortness of breath.  # Acute blood loss anemia: Recent blood transfusion. Hemoglobin 8.0. Continue to monitor. No sign of active bleed.  #History of PE on Coumadin: No known history of DVT. PE was in 1999 in the setting of CABG surgery.  Supratherapeutic INR on admission. No need for Coumadin for PE.  # New onset atrial fibrillation with RVR: Evaluated by cardiology. Currently on a Cardizem oral. Required IV Cardizem last night because of altered mental status. Follow-up echocardiogram. I have discussed with Dr.Mclean today recommended to hold anticoagulation for a week. No need for aspirin. I have discussed this with patient's daughter at bedside. Plan to resume Eliquis after 7 days. Follow-up cardiologist recommendation.  #Diabetes type 2: Continue sliding scale and low-dose Lantus. Monitor blood sugar level.  #History of coronary artery disease: Continue Zocor. May be able to resume aspirin in few days when hematoma improves. -Patient also with mild elevation in troponin level. Cardiology is following  #Hypertension: Blood pressure elevated. Continue Lotensin and diltiazem. Dc  norvasc  #Dyslipidemia: Continue Zocor.  #Acute kidney injury: Serum creatinine level stable. Unknown patient's baseline serum creatinine level. He may have chronic kidney disease. 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 Recommend outpatient follow-up. -Monitor BMP. Avoid nephrotoxins  #Altered mental status likely acute delirium. Mental status improved. Continue supportive care. CT scan of head with no acute finding.  PT OT evaluation.  DVT prophylaxis: SCD Code Status: Full code Family Communication: Discussed with patient's daughter at bedside Disposition Plan: Current in the stepdown    Consultants:   Pulmonologist  General surgery   IR  Cardiology  Procedures: None Antimicrobials: None  Subjective: Seen and examined at bedside. Denied headache, dizziness, nausea vomiting chest pain or shortness of breath. Objective: Vitals:   03/23/17 2342 03/24/17 0425 03/24/17 0741 03/24/17 1149  BP: (!) 134/91 116/69  (!) 122/54  Pulse: (!) 102 97    Resp: 20 (!) 21    Temp: 98.3 F (36.8 C) 98.7 F (37.1 C) 98.3 F (36.8 C) 99.4 F (37.4 C)  TempSrc: Axillary Oral Oral Oral  SpO2: 93% 95%    Weight:      Height:        Intake/Output Summary (Last 24 hours) at 03/24/17 1154 Last data filed at 03/24/17 0100  Gross per 24 hour  Intake              300 ml  Output             1100 ml  Net             -800 ml   Filed Weights   03/20/17 1616 03/23/17 0605  Weight: 63  kg (138 lb 14.2 oz) 64.9 kg (143 lb 1.3 oz)    Examination:  General exam:Lying on bed comfortable, not in distress Respiratory system: Clear bilateral, no wheezing or crackle Right chest wall bruises and has chest binder, examined today, swelling improving Cardiovascular system: Regular rate and rhythm, S1-S2 normal. No pedal edema Gastrointestinal system: Abdomen soft, nontender, nondistended. Bowel sound positive.. Central nervous system:  Alert awake and oriented. No focal neurological deficit. Skin: No rashes, lesions or ulcers    Data Reviewed: I have personally reviewed following labs and imaging studies  CBC:  Recent Labs Lab 03/21/17 1137 03/21/17 1937 03/22/17 0106 03/23/17 0255 03/23/17 1836 03/24/17 0249  WBC 10.8* 10.5 10.6* 8.4  --  7.2  HGB 7.7* 7.6* 7.7* 7.2* 8.3* 8.2*  HCT 22.5* 22.3* 22.6* 21.3* 25.0* 24.5*  MCV 90.4 90.7 91.1 91.4  --  91.4  PLT 123* 131* 126* 174  --  734   Basic Metabolic Panel:  Recent Labs Lab 03/18/17 1933 03/20/17 0315 03/21/17 0310 03/22/17 0106  NA 137 132* 134* 134*  K 4.5 4.5 3.9 3.8  CL 108 105 104 103  CO2 24 21* 24 25  GLUCOSE 197* 340* 200* 178*  BUN 23* 20 14 21*  CREATININE 1.59* 1.52* 1.26* 1.35*  CALCIUM 8.8* 7.3* 7.8* 7.8*  MG  --   --   --  1.9   GFR: Estimated Creatinine Clearance: 41.4 mL/min (A) (by C-G formula based on SCr of 1.35 mg/dL (H)). Liver Function Tests:  Recent Labs Lab 03/18/17 1933  AST 21  ALT 13*  ALKPHOS 68  BILITOT 1.5*  PROT 5.9*  ALBUMIN 4.0   No results for input(s): LIPASE, AMYLASE in the last 168 hours.  Recent Labs Lab 03/22/17 1614  AMMONIA 17   Coagulation Profile:  Recent Labs Lab 03/18/17 1933 03/20/17 0331 03/20/17 1954 03/21/17 0310 03/22/17 0106  INR 3.06 4.82* 1.28 1.21 1.11   Cardiac Enzymes:  Recent Labs Lab 03/22/17 0106 03/22/17 0543 03/22/17 1214  TROPONINI 0.04* 0.04* 0.04*   BNP (last 3 results) No results for input(s): PROBNP in the last 8760 hours. HbA1C: No results for input(s): HGBA1C in the last 72 hours. CBG:  Recent Labs Lab 03/23/17 1138 03/23/17 1557 03/23/17 2120 03/24/17 0741 03/24/17 1148  GLUCAP 324* 212* 112* 295* 232*   Lipid Profile: No results for input(s): CHOL, HDL, LDLCALC, TRIG, CHOLHDL, LDLDIRECT in the last 72 hours. Thyroid Function Tests:  Recent Labs  03/22/17 0106  TSH 1.276   Anemia Panel: No results for input(s): VITAMINB12,  FOLATE, FERRITIN, TIBC, IRON, RETICCTPCT in the last 72 hours. Sepsis Labs: No results for input(s): PROCALCITON, LATICACIDVEN in the last 168 hours.  Recent Results (from the past 240 hour(s))  MRSA PCR Screening     Status: None   Collection Time: 03/20/17  5:08 PM  Result Value Ref Range Status   MRSA by PCR NEGATIVE NEGATIVE Final    Comment:        The GeneXpert MRSA Assay (FDA approved for NASAL specimens only), is one component of a comprehensive MRSA colonization surveillance program. It is not intended to diagnose MRSA infection nor to guide or monitor treatment for MRSA infections.          Radiology Studies: Ct Head Wo Contrast  Result Date: 03/23/2017 CLINICAL DATA:  Altered level of consciousness EXAM: CT HEAD WITHOUT CONTRAST TECHNIQUE: Contiguous axial images were obtained from the base of the skull through the vertex without intravenous contrast. COMPARISON:  None. FINDINGS: Brain: Chronic mild to moderate small vessel ischemic disease. No acute intracranial hemorrhage, midline shift or edema. No intra-axial mass nor extra-axial fluid collections. No hydrocephalus. Mild superficial and central atrophy. Vascular: Moderate atherosclerosis of the carotid siphons. No hyperdense vessels. Skull: No acute skull fracture. Sinuses/Orbits: There is mild circumferential mucosal thickening of the left maxillary sinus with thickened maxillary sinus walls consistent with chronic sinusitis. Mild ethmoid sinus mucosal thickening. Clear sphenoid and frontal sinuses. Intact orbits and globes. Other: None IMPRESSION: 1. Chronic small vessel ischemic disease of periventricular white matter with atrophy. 2. No acute intracranial abnormality. 3. Chronic left maxillary sinusitis. Electronically Signed   By: Ashley Royalty M.D.   On: 03/23/2017 23:47   Dg Chest Port 1 View  Result Date: 03/23/2017 CLINICAL DATA:  Hypoxia this evening EXAM: PORTABLE CHEST 1 VIEW COMPARISON:  03/19/2017 chest CT  FINDINGS: Soft tissue prominence over the anterior and lateral right chest wall consistent with known right-sided pectoralis hematoma. Slightly low lung volumes with crowding of interstitial lung markings. Faint bibasilar opacities are seen consistent with atelectasis. Slightly more confluent opacities seen at the right lung base however and a superimposed pneumonia is not excluded. The patient is status post CABG. Heart is normal in size with aortic atherosclerosis. No overt pulmonary edema. No acute osseous appearing abnormality. IMPRESSION: 1. Low lung volumes with crowding of interstitial lung markings. Hazy opacity at the right lung base cannot exclude a subtle pneumonia and atelectasis. 2. No overt pulmonary edema. 3. Soft tissue swelling about the right chest wall consistent with known pectoralis hematoma. Electronically Signed   By: Ashley Royalty M.D.   On: 03/23/2017 02:54        Scheduled Meds: . amitriptyline  10 mg Oral QHS  . atorvastatin  20 mg Oral q1800  . benazepril  40 mg Oral Daily  . dextromethorphan-guaiFENesin  1 tablet Oral BID  . diltiazem  30 mg Oral Q6H  . ferrous sulfate  325 mg Oral Q breakfast  . Influenza vac split quadrivalent PF  0.5 mL Intramuscular Tomorrow-1000  . insulin aspart  0-15 Units Subcutaneous TID WC  . insulin aspart  0-5 Units Subcutaneous QHS  . insulin glargine  6 Units Subcutaneous Daily   Continuous Infusions:    LOS: 4 days    Nekeya Briski Tanna Furry, MD [facility] Pager [phone]  If 7PM-7AM, please contact night-coverage [email or web address] Password [email or web address] 03/24/2017, 11:54 AM

## 2017-03-25 ENCOUNTER — Inpatient Hospital Stay (HOSPITAL_COMMUNITY): Payer: Medicare Other

## 2017-03-25 DIAGNOSIS — D689 Coagulation defect, unspecified: Secondary | ICD-10-CM

## 2017-03-25 DIAGNOSIS — R410 Disorientation, unspecified: Secondary | ICD-10-CM

## 2017-03-25 DIAGNOSIS — E119 Type 2 diabetes mellitus without complications: Secondary | ICD-10-CM

## 2017-03-25 DIAGNOSIS — C61 Malignant neoplasm of prostate: Secondary | ICD-10-CM

## 2017-03-25 DIAGNOSIS — T148XXA Other injury of unspecified body region, initial encounter: Secondary | ICD-10-CM

## 2017-03-25 DIAGNOSIS — I2510 Atherosclerotic heart disease of native coronary artery without angina pectoris: Secondary | ICD-10-CM

## 2017-03-25 LAB — GLUCOSE, CAPILLARY
GLUCOSE-CAPILLARY: 344 mg/dL — AB (ref 65–99)
GLUCOSE-CAPILLARY: 179 mg/dL — AB (ref 65–99)
Glucose-Capillary: 221 mg/dL — ABNORMAL HIGH (ref 65–99)
Glucose-Capillary: 239 mg/dL — ABNORMAL HIGH (ref 65–99)

## 2017-03-25 LAB — CBC
HCT: 24.5 % — ABNORMAL LOW (ref 39.0–52.0)
Hemoglobin: 8.1 g/dL — ABNORMAL LOW (ref 13.0–17.0)
MCH: 30.6 pg (ref 26.0–34.0)
MCHC: 33.1 g/dL (ref 30.0–36.0)
MCV: 92.5 fL (ref 78.0–100.0)
PLATELETS: 230 10*3/uL (ref 150–400)
RBC: 2.65 MIL/uL — ABNORMAL LOW (ref 4.22–5.81)
RDW: 14.4 % (ref 11.5–15.5)
WBC: 9.2 10*3/uL (ref 4.0–10.5)

## 2017-03-25 LAB — BASIC METABOLIC PANEL
ANION GAP: 6 (ref 5–15)
BUN: 29 mg/dL — AB (ref 6–20)
CHLORIDE: 102 mmol/L (ref 101–111)
CO2: 24 mmol/L (ref 22–32)
Calcium: 7.8 mg/dL — ABNORMAL LOW (ref 8.9–10.3)
Creatinine, Ser: 1.33 mg/dL — ABNORMAL HIGH (ref 0.61–1.24)
GFR, EST AFRICAN AMERICAN: 58 mL/min — AB (ref >60)
GFR, EST NON AFRICAN AMERICAN: 50 mL/min — AB (ref >60)
Glucose, Bld: 244 mg/dL — ABNORMAL HIGH (ref 65–99)
POTASSIUM: 3.7 mmol/L (ref 3.5–5.1)
SODIUM: 132 mmol/L — AB (ref 135–145)

## 2017-03-25 LAB — BRAIN NATRIURETIC PEPTIDE: B Natriuretic Peptide: 410.3 pg/mL — ABNORMAL HIGH (ref 0.0–100.0)

## 2017-03-25 MED ORDER — DILTIAZEM HCL ER COATED BEADS 120 MG PO CP24
120.0000 mg | ORAL_CAPSULE | Freq: Every day | ORAL | Status: DC
  Administered 2017-03-25 – 2017-03-27 (×3): 120 mg via ORAL
  Filled 2017-03-25 (×3): qty 1

## 2017-03-25 NOTE — Progress Notes (Signed)
Physical Therapy Treatment Patient Details Name: Bryan Carney MRN: [id number] DOB: May 31, 1941 Today's Date: 03/25/2017    History of Present Illness Bryan Carney is a 76 y.o. male with a past medical history significant for hx of PE on warfarin, CAD/PVD on aspirin, NIDDM, and HTN who presents with spontaneous chest wall hematoma and now syncope.Pt on coumadin for PE,  presented to the ED on 9/24 with chest wall ecchymosis/bruising Large right pectoralis/sub pectoralis chest wall hematoma. No active extravasation.   Dependent atelectasis in the lung bases, otherwise no acute. PMH for CAD, hx of PE, HTN, hyperlipedemia    PT Comments    Pt admitted with above diagnosis. Pt currently with functional limitations due to balance and endurance deficits as well as decr responsiveness today. Pt was unable to participate fully due to decr responsiveness and poor coordination of movement.  Nurse made aware.  Pt  symptoms wax and wane per nurse.  Will continue to follow as pt is able.  Pt to have Doppler on right UE today for suspicious DVT.  Pt will benefit from skilled PT to increase their independence and safety with mobility to allow discharge to the venue listed below.     Follow Up Recommendations  Supervision/Assistance - 24 hour;CIR     Equipment Recommendations  Other (comment) (TBA)    Recommendations for Other Services Rehab consult;OT consult     Precautions / Restrictions Precautions Required Braces or Orthoses: Other Brace/Splint (breast binder over hematoma) Restrictions Weight Bearing Restrictions: No    Mobility  Bed Mobility Overal bed mobility: Needs Assistance Bed Mobility: Sit to Supine       Sit to supine: Mod assist;+2 for physical assistance   General bed mobility comments: mod A for LE management onto bed  Transfers Overall transfer level: Needs assistance Equipment used: Rolling walker (2 wheeled);2 person hand held assist Transfers: Sit to/from [employer] Sit to Stand: Mod assist;+2 physical assistance Stand pivot transfers: Mod assist;+2 physical assistance       General transfer comment: Attempted to stand to RW with one person assist and pt struggling to process commands as well as weakness UE and LES with tremors and poor coordination, therefore sat pt back down.  Noted facial droop and called nursing to come in.  Nurse assessed pt as above and determined to get pt back in bed to rest.  Needed +2 HHA to get into chair needing mod assit and cues with pt able to step around with cues and assist.  Heavy posterior lean with pivot transfer  Ambulation/Gait                 Stairs            Wheelchair Mobility    Modified Rankin (Stroke Patients Only)       Balance Overall balance assessment: Needs assistance Sitting-balance support: Feet supported;No upper extremity supported Sitting balance-Leahy Scale: Fair Sitting balance - Comments: sat EOB 1 min without UE support                                    Cognition Arousal/Alertness: Lethargic Behavior During Therapy: Flat affect Overall Cognitive Status: Impaired/Different from baseline Area of Impairment: Memory;Safety/judgement;Following commands;Problem solving                     Memory: Decreased short-term memory Following Commands: Follows one step commands with increased  time;Follows one step commands inconsistently Safety/Judgement: Decreased awareness of deficits;Decreased awareness of safety   Problem Solving: Slow processing;Decreased initiation;Difficulty sequencing;Requires verbal cues;Requires tactile cues General Comments: Pt slow to respond.  Had to place hand on chair as pt not following commands.  Pt also noted to have left facial droop.  CAlled nurse to room and nurse assessed pt.  REsults of assessment incomplete as he waxes and wanes with symptoms.  nurse to notify MD.       Exercises General Exercises -  Lower Extremity Long Arc Quad: AROM;5 reps;Both;Seated    General Comments General comments (skin integrity, edema, etc.): 74-82 bpm, BP initially 127/66 on arrival.  106/57 with stand.  133/50 once back in bed.  Sats >90% on RA but replaced O2 at end of treatment as sats 88% once back in bed.  O2 95% at 2LO2.  Wife present and concerned about pt's waxing and waning symptoms.        Pertinent Vitals/Pain Pain Assessment: Faces Faces Pain Scale: Hurts whole lot Pain Location: right hand Pain Descriptors / Indicators: Aching;Grimacing;Guarding Pain Intervention(s): Limited activity within patient's tolerance;Monitored during session;Repositioned    Home Living                      Prior Function            PT Goals (current goals can now be found in the care plan section) Progress towards PT goals: Progressing toward goals    Frequency    Min 3X/week      PT Plan Discharge plan needs to be updated    Co-evaluation              AM-PAC PT "6 Clicks" Daily Activity  Outcome Measure  Difficulty turning over in bed (including adjusting bedclothes, sheets and blankets)?: Unable Difficulty moving from lying on back to sitting on the side of the bed? : Unable Difficulty sitting down on and standing up from a chair with arms (e.g., wheelchair, bedside commode, etc,.)?: A Lot Help needed moving to and from a bed to chair (including a wheelchair)?: A Lot Help needed walking in hospital room?: Total Help needed climbing 3-5 steps with a railing? : Total 6 Click Score: 8    End of Session Equipment Utilized During Treatment: Gait belt;Oxygen Activity Tolerance: Patient limited by lethargy;Patient limited by fatigue Patient left: with call bell/phone within reach;with family/visitor present;in chair Nurse Communication: Mobility status (facial droop and poor responsiveness) PT Visit Diagnosis: Other abnormalities of gait and mobility (R26.89);Muscle weakness  (generalized) (M62.81);Difficulty in walking, not elsewhere classified (R26.2)     Time: 0086-7619 PT Time Calculation (min) (ACUTE ONLY): 23 min  Charges:  $Therapeutic Exercise: 8-22 mins $Therapeutic Activity: 8-22 mins                    G Codes:       Ashelyn Mccravy,PT Acute Rehabilitation [phone] (pager)    Denice Paradise 03/25/2017, 11:40 AM

## 2017-03-25 NOTE — Progress Notes (Signed)
Inpatient Diabetes Program Recommendations  AACE/ADA: New Consensus Statement on Inpatient Glycemic Control (2015)  Target Ranges:  Prepandial:   less than 140 mg/dL      Peak postprandial:   less than 180 mg/dL (1-2 hours)      Critically ill patients:  140 - 180 mg/dL   Lab Results  Component Value Date   GLUCAP 221 (H) 03/25/2017    Review of Glycemic Control Results for FARREN, NELLES (MRN [id number]) as of 03/25/2017 08:35  Ref. Range 03/24/2017 07:41 03/24/2017 11:48 03/24/2017 15:37 03/24/2017 21:25 03/25/2017 08:27  Glucose-Capillary Latest Ref Range: 65 - 99 mg/dL 295 (H) 232 (H) 273 (H) 222 (H) 221 (H)   Diabetes history: DM2 Outpatient Diabetes medications: Kombiglyze XR 10-998 Daily Current orders for Inpatient glycemic control: Lantus 6 units daily + Novolog moderate correction tid + 0-5 units hs  Inpatient Diabetes Program Recommendations:    -Increase Lantus to 10 units daily.  Thank you, Nani Gasser. Jakyiah Briones, RN, MSN, CDE  Diabetes Coordinator Inpatient Glycemic Control Team Team Pager [phone] (8am-5pm) 03/25/2017 8:43 AM

## 2017-03-25 NOTE — Progress Notes (Signed)
Inpatient Rehabilitation  Received pre-screen request and note that a consult order has also been placed.  Please plan for an Admissions Coordinator to follow up after the MD has completed the consult.    Carmelia Roller., CCC/SLP Admission Coordinator  [place]  Cell [phone]

## 2017-03-25 NOTE — Clinical Social Work Note (Signed)
Clinical Social Work Assessment  Patient Details  Name: Bryan Carney MRN: [id number] Date of Birth: Jun 30, 1940  Date of referral:  03/25/17               Reason for consult:  Facility Placement, Discharge Planning                Permission sought to share information with:  Facility Sport and exercise psychologist, Family Supports Permission granted to share information::  Yes, Verbal Permission Granted  Name::     Jonathon Tan  Agency::     Relationship::  Wife  Contact Information:  [phone]  Housing/Transportation Living arrangements for the past 2 months:  Single Family Home Source of Information:  Patient, Medical Team, Spouse, Other (Comment Required) (Wife's cousin) Patient Interpreter Needed:  None Criminal Activity/Legal Involvement Pertinent to Current Situation/Hospitalization:  No - Comment as needed Significant Relationships:  Adult Children, Spouse, Other Family Members Lives with:  Spouse Do you feel safe going back to the place where you live?  Yes Need for family participation in patient care:  Yes (Comment)  Care giving concerns:  PT recommending CIR. CSW initiating SNF backup.   Social Worker assessment / plan:  CSW met with patient. Patient sleeping and did not wake up to conversation. Patient's wife and her cousin at bedside. CSW introduced role and explained that PT recommendations would be discussed. Discussed recommendation for CIR but potential barriers and possible need for SNF. Patient's wife was shocked about this backup recommendation. She is very hopeful that he can either go home with HHPT or go to CIR and then return home with home health. Per wife and her cousin, patient was completely independent prior to admission, still works and drives. CSW provided SNF list for patient's wife to review in case he ended up needing this level of care but she is very hopeful that he will not. She is concerned about his hand pain. CSW will continue to follow progress. No  further concerns. CSW encouraged patient's wife to contact CSW as needed. CSW will continue to follow patient and his family for support and facilitate discharge to SNF, if needed, once medically stable.  Employment status:  Transport planner PT Recommendations:  Inpatient Rehab Consult Information / Referral to community resources:  [place]  Patient/Family's Response to care:  Patient sleeping and did not arouse to conversation. Patient's wife would really prefer home health or if he needs rehab, CIR then home. Patient's family supportive and involved in patient's care. Patient's wife and her cousin appreciated social work intervention.  Patient/Family's Understanding of and Emotional Response to Diagnosis, Current Treatment, and Prognosis:  Patient sleeping and did not arouse to conversation. Patient's wife has a good understanding of the reason for admission but hopeful that he will not need higher level of care before discharge home. Patient's wife appear happy with hospital care.  Emotional Assessment Appearance:  Appears stated age Attitude/Demeanor/Rapport:  Unable to Assess Affect (typically observed):  Unable to Assess Orientation:  Oriented to Self, Oriented to Place, Oriented to  Time, Oriented to Situation Alcohol / Substance use:  Never Used Psych involvement (Current and /or in the community):  No (Comment)  Discharge Needs  Concerns to be addressed:  Care Coordination Readmission within the last 30 days:  No Current discharge risk:  Dependent with Mobility Barriers to Discharge:  Continued Medical Work up, [place], LCSW 03/25/2017, 2:48 PM

## 2017-03-25 NOTE — Progress Notes (Signed)
Preliminary results by tech - Right Upper Ext. Venous Duplex Completed. Negative for deep vein thrombosis, positive for an acute superficial vein thrombus in the cephalic vein at the level of the antecubital fossa. Results given to patient's nurse. Oda Cogan, BS, RDMS, RVT

## 2017-03-25 NOTE — Consult Note (Signed)
Physical Medicine and Rehabilitation Consult   Reason for Consult: Deficits in mobility ability   Referring Physician:  Dr.Bhandari.    HPI: Bryan Carney is a 76 y.o. male with history of CAD/PVD, PE-on chronic coumadin, T2DM, prostate cancer,  who was admitted on 03/20/17 with syncope, significant enlargement of right chest hematoma and weakness. History taken from chart review, patient, and family.  Coumadin reversed with FFP and Vitamin K and he was transfused with 2 units PRBC for Hgb of 6.4.  CT chest without signs of extravasation and not need for angiogram per IVR. No surgical intervention needed per IR. Bryan Carney and he was started on IV Cardizem to help manage tachycardia. Hospital course significant for A fib with RVR, diffuse weakness with tremors, difficulty following commands fluctuating bouts of lethargy and heavy posterior lean with transfer attempts. Head CT 10/1 reviewed, unremarkable for acute process. CIR recommended due to deficits in mobility.    Review of Systems  HENT: Negative for hearing loss and tinnitus.   Eyes: Negative for blurred vision and double vision.  Respiratory: Negative for cough and shortness of breath.   Cardiovascular: Negative for chest pain and palpitations.  Gastrointestinal: Negative for heartburn and nausea.  Genitourinary: Negative for dysuria and urgency.  Musculoskeletal: Negative for back pain and myalgias.  Skin: Negative for itching and rash.  Neurological: Positive for weakness. Negative for dizziness and headaches.  Psychiatric/Behavioral: The patient is not nervous/anxious.        Wife reporting issue with confusion since last Friday.  All other systems reviewed and are negative.     Past Medical History:  Diagnosis Date  . CAD (coronary artery disease)   . Diabetes ([place])   . Hx pulmonary embolism   . Hyperlipidemia   . Hypertension   . Prostate cancer ([place])   . PVOD (pulmonary veno-occlusive disease) ([place])   . Squamous  cell carcinoma     Past Surgical History:  Procedure Laterality Date  . BACK SURGERY    . CARDIAC CATHETERIZATION  10/15/1997   Recommend complete revascularization by CABG  . CARDIOVASCULAR STRESS TEST  06/12/2012   No evidence of ischemia  . CAROTID DOPPLER  08/27/2012   Rt bulb/proximal ICA demonstrated a mild-moderate amount of fibrous plaque without evidence of a significant diameter reduction or any other vascular abnormality.  . CAROTID ENDARTERECTOMY Left 02/23/1995  . CORONARY ARTERY BYPASS GRAFT  10/19/1997   x5. LIMA-LAD, SVG-PDA, SVG-OM1, seq SVG-fist and second branches of ramus intermedius.    Family History  Problem Relation Age of Onset  . Heart attack Father   . Cancer Sister   . Diabetes Brother   . Cancer Sister   . Diabetes Brother   . Diabetes Brother   . Diabetes Brother     Social History:  reports that he has never smoked. He has never used smokeless tobacco. He reports that he does not drink alcohol. His drug history is not on file.    Allergies  Allergen Reactions  . Lopressor [Metoprolol Tartrate] Other (See Comments)    Severe BP drop    Medications Prior to Admission  Medication Sig Dispense Refill  . amitriptyline (ELAVIL) 10 MG tablet Take 10 mg by mouth at bedtime.    Marland Kitchen amLODipine (NORVASC) 5 MG tablet Take 5 mg by mouth at bedtime.     Marland Kitchen aspirin 325 MG tablet Take 325 mg by mouth daily.    . benazepril (LOTENSIN) 40 MG tablet Take 40  mg by mouth daily.    . ferrous sulfate 325 (65 FE) MG tablet Take 325 mg by mouth daily with breakfast.     . KOMBIGLYZE XR 10-998 MG TB24 Take 1 tablet by mouth daily.   0  . simvastatin (ZOCOR) 40 MG tablet Take 40 mg by mouth daily.    Marland Kitchen warfarin (COUMADIN) 5 MG tablet Take 1.5 to 2 tablets by mouth daily as directed by coumadin clinic (Patient taking differently: Take 7.5-10 mg by mouth See admin instructions. Take 10 mg on Thursday and Friday then take 7.5 mg all there other days) 60 tablet 2  .  HYDROcodone-acetaminophen (NORCO/VICODIN) 5-325 MG tablet Take 1 tablet by mouth every 6 (six) hours as needed. 6 tablet 0    Home: Home Living Family/patient expects to be discharged to:: Private residence Living Arrangements: Spouse/significant other Available Help at Discharge: Family, Available 24 hours/day Type of Home: House Home Access: Level entry Home Layout: One level Bathroom Shower/Tub: Multimedia programmer: Handicapped height Bathroom Accessibility: Yes Home Equipment: None  Functional History: Prior Function Level of Independence: Independent Comments: owns car dealership, works full time, Ambulance person Status:  Mobility: Bed Mobility Overal bed mobility: Needs Assistance Bed Mobility: Sit to Supine Supine to sit: Max assist Sit to supine: Mod assist, +2 for physical assistance General bed mobility comments: mod A for LE management onto bed Transfers Overall transfer level: Needs assistance Equipment used: Rolling walker (2 wheeled), 2 person hand held assist Transfers: Sit to/from Stand, Stand Pivot Transfers Sit to Stand: Mod assist, +2 physical assistance Stand pivot transfers: Mod assist, +2 physical assistance General transfer comment: Attempted to stand to RW with one person assist and pt struggling to process commands as well as weakness UE and LES with tremors and poor coordination, therefore sat pt back down.  Noted facial droop and called nursing to come in.  Nurse assessed pt as above and determined to get pt back in bed to rest.  Needed +2 HHA to get into chair needing mod assit and cues with pt able to step around with cues and assist.  Heavy posterior lean with pivot transfer      ADL:    Cognition: Cognition Overall Cognitive Status: Impaired/Different from baseline Orientation Level: Oriented X4 Cognition Arousal/Alertness: Lethargic Behavior During Therapy: Flat affect Overall Cognitive Status: Impaired/Different from  baseline Area of Impairment: Memory, Safety/judgement, Following commands, Problem solving Memory: Decreased short-term memory Following Commands: Follows one step commands with increased time, Follows one step commands inconsistently Safety/Judgement: Decreased awareness of deficits, Decreased awareness of safety Problem Solving: Slow processing, Decreased initiation, Difficulty sequencing, Requires verbal cues, Requires tactile cues General Comments: Pt slow to respond.  Had to place hand on chair as pt not following commands.  Pt also noted to have left facial droop.  CAlled nurse to room and nurse assessed pt.  REsults of assessment incomplete as he waxes and wanes with symptoms.  nurse to notify MD.    Blood pressure 127/66, pulse 72, temperature 98 F (36.7 C), temperature source Axillary, resp. rate 20, height 5\' 6"  (1.676 m), weight 64.9 kg (143 lb 1.3 oz), SpO2 94 %. Physical Exam  Nursing note and vitals reviewed. Constitutional: He is oriented to person, place, and time. He appears well-developed and well-nourished. He appears lethargic. He is easily aroused. Nasal cannula in place.  HENT:  Head: Normocephalic and atraumatic.  Eyes: Pupils are equal, round, and reactive to light. Conjunctivae and EOM are normal.  Neck: Normal  range of motion. Neck supple.  Cardiovascular: Normal rate.  An irregularly irregular rhythm present.  Murmur heard. Respiratory: Effort normal and breath sounds normal. No stridor. He exhibits tenderness (diffuse ecchymosis upper chest wall and RUE).  GI: Soft. Bowel sounds are normal. He exhibits no distension. There is no tenderness.  Musculoskeletal: He exhibits edema and tenderness.  RUE  Limited due to pain for edema and ecchymosis.   Neurological: He is oriented to person, place, and time and easily aroused. He appears lethargic.  He was able to follow simple one step commands. Speech clear.  Left facial weakness.  Motor: RUE: Limited by pain, hand  grip 2/5 LUE: 5/5 proximal to distal LLE: 4/5 HF, KE, 4+/5 ADF/PF RLE: 4-/5 HF, KE, 4/5 ADF/PF  Skin: Skin is warm and dry. He is not diaphoretic.  Psychiatric: His affect is blunt. He is slowed.    Results for orders placed or performed during the hospital encounter of 03/20/17 (from the past 24 hour(s))  Glucose, capillary     Status: Abnormal   Collection Time: 03/24/17  3:37 PM  Result Value Ref Range   Glucose-Capillary 273 (H) 65 - 99 mg/dL   Comment 1 Notify RN    Comment 2 Document in Chart   Glucose, capillary     Status: Abnormal   Collection Time: 03/24/17  9:25 PM  Result Value Ref Range   Glucose-Capillary 222 (H) 65 - 99 mg/dL  CBC     Status: Abnormal   Collection Time: 03/25/17  2:50 AM  Result Value Ref Range   WBC 9.2 4.0 - 10.5 K/uL   RBC 2.65 (L) 4.22 - 5.81 MIL/uL   Hemoglobin 8.1 (L) 13.0 - 17.0 g/dL   HCT 24.5 (L) 39.0 - 52.0 %   MCV 92.5 78.0 - 100.0 fL   MCH 30.6 26.0 - 34.0 pg   MCHC 33.1 30.0 - 36.0 g/dL   RDW 14.4 11.5 - 15.5 %   Platelets 230 150 - 400 K/uL  Glucose, capillary     Status: Abnormal   Collection Time: 03/25/17  8:27 AM  Result Value Ref Range   Glucose-Capillary 221 (H) 65 - 99 mg/dL   Comment 1 Notify RN    Comment 2 Document in Chart   Brain natriuretic peptide     Status: Abnormal   Collection Time: 03/25/17 10:13 AM  Result Value Ref Range   B Natriuretic Peptide 410.3 (H) 0.0 - 100.0 pg/mL  Glucose, capillary     Status: Abnormal   Collection Time: 03/25/17 11:38 AM  Result Value Ref Range   Glucose-Capillary 344 (H) 65 - 99 mg/dL   Comment 1 Notify RN    Comment 2 Document in Chart    Ct Head Wo Contrast  Result Date: 03/23/2017 CLINICAL DATA:  Altered level of consciousness EXAM: CT HEAD WITHOUT CONTRAST TECHNIQUE: Contiguous axial images were obtained from the base of the skull through the vertex without intravenous contrast. COMPARISON:  None. FINDINGS: Brain: Chronic mild to moderate small vessel ischemic disease.  No acute intracranial hemorrhage, midline shift or edema. No intra-axial mass nor extra-axial fluid collections. No hydrocephalus. Mild superficial and central atrophy. Vascular: Moderate atherosclerosis of the carotid siphons. No hyperdense vessels. Skull: No acute skull fracture. Sinuses/Orbits: There is mild circumferential mucosal thickening of the left maxillary sinus with thickened maxillary sinus walls consistent with chronic sinusitis. Mild ethmoid sinus mucosal thickening. Clear sphenoid and frontal sinuses. Intact orbits and globes. Other: None IMPRESSION: 1. Chronic small vessel  ischemic disease of periventricular white matter with atrophy. 2. No acute intracranial abnormality. 3. Chronic left maxillary sinusitis. Electronically Signed   By: Ashley Royalty M.D.   On: 03/23/2017 23:47    Assessment/Plan: Diagnosis: Weakness Labs and images independently reviewed.  Records reviewed and summated above.  1. Does the need for close, 24 hr/day medical supervision in concert with the patient's rehab needs make it unreasonable for this patient to be served in a less intensive setting? Yes 2. Co-Morbidities requiring supervision/potential complications: A fib with RVR (monitor HR with increased activity), CAD/PVD (cont meds), PE (cont meds when appropriate), T2 DM (Monitor in accordance with exercise and adjust meds as necessary), prostate cancer, HTN (monitor and provide prns in accordance with increased physical exertion and pain), ABLA (transfuse if necessary to ensure appropriate perfusion for increased activity tolerance) 3. Due to safety, disease management and patient education, does the patient require 24 hr/day rehab nursing? Yes 4. Does the patient require coordinated care of a physician, rehab nurse, PT (1-2 hrs/day, 5 days/week) and OT (1-2 hrs/day, 5 days/week) to address physical and functional deficits in the context of the above medical diagnosis(es)? Yes Addressing deficits in the  following areas: balance, endurance, locomotion, strength, transferring, bathing, dressing, toileting and psychosocial support 5. Can the patient actively participate in an intensive therapy program of at least 3 hrs of therapy per day at least 5 days per week? Potentially 6. The potential for patient to make measurable gains while on inpatient rehab is excellent 7. Anticipated functional outcomes upon discharge from inpatient rehab are supervision  with PT, supervision with OT, n/a with SLP. 8. Estimated rehab length of stay to reach the above functional goals is: 17-20 days. 9. Anticipated D/C setting: Home 10. Anticipated post D/C treatments: HH therapy and Home excercise program 11. Overall Rehab/Functional Prognosis: good  RECOMMENDATIONS: This patient's condition is appropriate for continued rehabilitative care in the following setting: CIR after completion of medical workup and medically stable. Patient has agreed to participate in recommended program. Potentially Note that insurance prior authorization may be required for reimbursement for recommended care.  Comment: Rehab Admissions Coordinator to follow up.  Delice Lesch, MD, ABPMR [facility], [place] 03/25/2017

## 2017-03-25 NOTE — Progress Notes (Addendum)
PROGRESS NOTE    Bryan Carney  KNL:[id number] DOB: January 13, 1941 DOA: 03/20/2017 PCP: [name], NP   Brief Narrative: 76 year old male with history of coronary artery disease, peripheral vascular disease, diabetes, hypertension, history of PE in 1990s on Coumadin presented with right chest wall swelling, bruises and hematoma. In the ER patient with hemoglobin of 6.4 with INR 4.8. Received FFP, noted blood cell and vitamin K. Admitted for further evaluation.  Assessment & Plan:  # Spontaneous right chest wall hematoma in the setting of Coumadin: -Patient received FFP and vitamin K in the ER. INR 1.1. -Patient received a unit of blood transfusion on 9/29. Hemoglobin 8.1. --Patient was evaluated by general surgery and IR. Continue chest binder and conservative management.   Patient denied chest pain or shortness of breath.  # Acute blood loss anemia: Hemoglobin is stable. Continue to monitor CBC.  #History of PE on Coumadin: No known history of DVT. PE was in 1999 in the setting of CABG surgery.  Supratherapeutic INR on admission. No need for Coumadin for PE.  # New onset atrial fibrillation with RVR:  -Heart rate is currently controlled on oral diltiazem. Echocardiogram unremarkable. Cardiology following. Anticoagulation currently on hold. Follow-up with cardiologist for evaluation to resume anticoagulation possibility Eliquis. I spoke with Cardiology PA Levada Dy who will be discussing with MD.  #Diabetes type 2: Continue sliding scale and low-dose Lantus. Monitor blood sugar level.  #History of coronary artery disease: Continue Zocor. May be able to resume aspirin in few days when hematoma improves.  -Patient also with mild elevation in troponin level. Follow-up cardiology.  #Hypertension: Blood pressure acceptable. Continue Lotensin and diltiazem. Dc norvasc  #Dyslipidemia: Continue Zocor.  #Acute kidney injury: Serum creatinine level stable. Unknown patient's baseline serum  creatinine level. He may have chronic kidney disease. 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 Recommend outpatient follow-up. -Monitor BMP. Avoid nephrotoxins  #Altered mental status likely acute delirium. Patient has fluctuation in mental status. He was allowed awake and oriented but slow to answer. I did not notice any focal neurological deficit. PT OT therapy and evaluation. I discussed with patient's wife. TSH level acceptable Ammonia not elevated CT head on 9/29 with no acute finding.   #Swelling of right upper extremity: Ordered Doppler ultrasound to rule out thrombosis. Continue arm elevation and supportive care. Unknown if it is caused by right chest wall hematoma.  DVT prophylaxis: SCD Code Status: Full code Family Communication: Discussed with patient's wife at bedside Disposition Plan: Current in the stepdown    Consultants:   Pulmonologist  General surgery   IR  Cardiology  Procedures: None Antimicrobials: None  Subjective: Seen and examined at bedside. Patient was sitting on chair comfortably. He was alert awake and oriented. Less interactive today. Denied headache, dizziness, nausea vomiting chest pain or shortness of breath. Patient's wife at bedside. Objective: Vitals:   03/25/17 0000 03/25/17 0400 03/25/17 0602 03/25/17 0830  BP: (!) 143/75 (!) 142/66 (!) 146/72 127/66  Pulse: 80 64  72  Resp: 17 19  20   Temp: 98.1 F (36.7 C) 98.3 F (36.8 C)  98 F (36.7 C)  TempSrc: Oral Oral  Oral  SpO2: 91% 96%  94%  Weight:      Height:        Intake/Output Summary (Last 24 hours) at 03/25/17 1046 Last data filed at 03/25/17 0700  Gross per 24 hour  Intake  0 ml  Output              800 ml  Net             -800 ml   Filed Weights   03/20/17 1616 03/23/17 0605  Weight: 63 kg (138 lb 14.2 oz) 64.9 kg (143 lb 1.3 oz)    Examination:  General exam: Sitting on chair comfortably, not  in distress Respiratory system: Clear bilateral, no wheezing or crackle. Right chest wall has chest binder, swelling is improving Cardiovascular system: Regular rate rhythm, S1-S2 normal. No pedal edema. Gastrointestinal system: Abdomen soft, nontender, nondistended. Bowel sound positive Central nervous system: Alert awake and oriented. Muscle strength 5 over 5 in all extremities. Skin: No rashes, lesions or ulcers Right upper extremity mild swollen.   Data Reviewed: I have personally reviewed following labs and imaging studies  CBC:  Recent Labs Lab 03/21/17 1937 03/22/17 0106 03/23/17 0255 03/23/17 1836 03/24/17 0249 03/25/17 0250  WBC 10.5 10.6* 8.4  --  7.2 9.2  HGB 7.6* 7.7* 7.2* 8.3* 8.2* 8.1*  HCT 22.3* 22.6* 21.3* 25.0* 24.5* 24.5*  MCV 90.7 91.1 91.4  --  91.4 92.5  PLT 131* 126* 174  --  192 102   Basic Metabolic Panel:  Recent Labs Lab 03/18/17 1933 03/20/17 0315 03/21/17 0310 03/22/17 0106  NA 137 132* 134* 134*  K 4.5 4.5 3.9 3.8  CL 108 105 104 103  CO2 24 21* 24 25  GLUCOSE 197* 340* 200* 178*  BUN 23* 20 14 21*  CREATININE 1.59* 1.52* 1.26* 1.35*  CALCIUM 8.8* 7.3* 7.8* 7.8*  MG  --   --   --  1.9   GFR: Estimated Creatinine Clearance: 41.4 mL/min (A) (by C-G formula based on SCr of 1.35 mg/dL (H)). Liver Function Tests:  Recent Labs Lab 03/18/17 1933  AST 21  ALT 13*  ALKPHOS 68  BILITOT 1.5*  PROT 5.9*  ALBUMIN 4.0   No results for input(s): LIPASE, AMYLASE in the last 168 hours.  Recent Labs Lab 03/22/17 1614  AMMONIA 17   Coagulation Profile:  Recent Labs Lab 03/18/17 1933 03/20/17 0331 03/20/17 1954 03/21/17 0310 03/22/17 0106  INR 3.06 4.82* 1.28 1.21 1.11   Cardiac Enzymes:  Recent Labs Lab 03/22/17 0106 03/22/17 0543 03/22/17 1214  TROPONINI 0.04* 0.04* 0.04*   BNP (last 3 results) No results for input(s): PROBNP in the last 8760 hours. HbA1C: No results for input(s): HGBA1C in the last 72  hours. CBG:  Recent Labs Lab 03/24/17 0741 03/24/17 1148 03/24/17 1537 03/24/17 2125 03/25/17 0827  GLUCAP 295* 232* 273* 222* 221*   Lipid Profile: No results for input(s): CHOL, HDL, LDLCALC, TRIG, CHOLHDL, LDLDIRECT in the last 72 hours. Thyroid Function Tests: No results for input(s): TSH, T4TOTAL, FREET4, T3FREE, THYROIDAB in the last 72 hours. Anemia Panel: No results for input(s): VITAMINB12, FOLATE, FERRITIN, TIBC, IRON, RETICCTPCT in the last 72 hours. Sepsis Labs: No results for input(s): PROCALCITON, LATICACIDVEN in the last 168 hours.  Recent Results (from the past 240 hour(s))  MRSA PCR Screening     Status: None   Collection Time: 03/20/17  5:08 PM  Result Value Ref Range Status   MRSA by PCR NEGATIVE NEGATIVE Final    Comment:        The GeneXpert MRSA Assay (FDA approved for NASAL specimens only), is one component of a comprehensive MRSA colonization surveillance program. It is not intended to diagnose MRSA infection nor to guide  or monitor treatment for MRSA infections.          Radiology Studies: Ct Head Wo Contrast  Result Date: 03/23/2017 CLINICAL DATA:  Altered level of consciousness EXAM: CT HEAD WITHOUT CONTRAST TECHNIQUE: Contiguous axial images were obtained from the base of the skull through the vertex without intravenous contrast. COMPARISON:  None. FINDINGS: Brain: Chronic mild to moderate small vessel ischemic disease. No acute intracranial hemorrhage, midline shift or edema. No intra-axial mass nor extra-axial fluid collections. No hydrocephalus. Mild superficial and central atrophy. Vascular: Moderate atherosclerosis of the carotid siphons. No hyperdense vessels. Skull: No acute skull fracture. Sinuses/Orbits: There is mild circumferential mucosal thickening of the left maxillary sinus with thickened maxillary sinus walls consistent with chronic sinusitis. Mild ethmoid sinus mucosal thickening. Clear sphenoid and frontal sinuses. Intact  orbits and globes. Other: None IMPRESSION: 1. Chronic small vessel ischemic disease of periventricular white matter with atrophy. 2. No acute intracranial abnormality. 3. Chronic left maxillary sinusitis. Electronically Signed   By: Ashley Royalty M.D.   On: 03/23/2017 23:47        Scheduled Meds: . amitriptyline  10 mg Oral QHS  . atorvastatin  20 mg Oral q1800  . benazepril  40 mg Oral Daily  . dextromethorphan-guaiFENesin  1 tablet Oral BID  . diltiazem  120 mg Oral Daily  . ferrous sulfate  325 mg Oral Q breakfast  . Influenza vac split quadrivalent PF  0.5 mL Intramuscular Tomorrow-1000  . insulin aspart  0-15 Units Subcutaneous TID WC  . insulin aspart  0-5 Units Subcutaneous QHS  . insulin glargine  6 Units Subcutaneous Daily   Continuous Infusions:    LOS: 5 days    Bryan Carney Tanna Furry, MD [facility] Pager [phone]  If 7PM-7AM, please contact night-coverage [email or web address] Password [email or web address] 03/25/2017, 10:46 AM

## 2017-03-25 NOTE — Progress Notes (Signed)
Progress Note  Patient Name: Bryan Carney Date of Encounter: 03/25/2017  Primary Cardiologist: Dr. [name]  Subjective   Pt sleeping but rousable. Family at bedside with concerns about fatigue.  Inpatient Medications    Scheduled Meds: . amitriptyline  10 mg Oral QHS  . atorvastatin  20 mg Oral q1800  . benazepril  40 mg Oral Daily  . dextromethorphan-guaiFENesin  1 tablet Oral BID  . diltiazem  30 mg Oral Q6H  . ferrous sulfate  325 mg Oral Q breakfast  . Influenza vac split quadrivalent PF  0.5 mL Intramuscular Tomorrow-1000  . insulin aspart  0-15 Units Subcutaneous TID WC  . insulin aspart  0-5 Units Subcutaneous QHS  . insulin glargine  6 Units Subcutaneous Daily   Continuous Infusions:  PRN Meds: ondansetron **OR** ondansetron (ZOFRAN) IV   Vital Signs    Vitals:   03/25/17 0000 03/25/17 0400 03/25/17 0602 03/25/17 0830  BP: (!) 143/75 (!) 142/66 (!) 146/72 127/66  Pulse: 80 64  72  Resp: 17 19  20   Temp: 98.1 F (36.7 C) 98.3 F (36.8 C)  98 F (36.7 C)  TempSrc: Oral Oral  Oral  SpO2: 91% 96%  94%  Weight:      Height:        Intake/Output Summary (Last 24 hours) at 03/25/17 1014 Last data filed at 03/25/17 0700  Gross per 24 hour  Intake                0 ml  Output              800 ml  Net             -800 ml   Filed Weights   03/20/17 1616 03/23/17 0605  Weight: 138 lb 14.2 oz (63 kg) 143 lb 1.3 oz (64.9 kg)     Physical Exam   General: Well developed, well nourished, male appearing in no acute distress. Head: Normocephalic, atraumatic.  Neck: Supple without bruits, no JVD. Lungs:  Resp regular and unlabored, CTA. Heart: RRR, S1, S2, no S3, S4, systolic murmur. Abdomen: Soft, non-tender, non-distended with normoactive bowel sounds. No hepatomegaly. No rebound/guarding. No obvious abdominal masses. Extremities: No clubbing, cyanosis, no edema. Distal pedal pulses are 2+ bilaterally. Neuro: drowsy but rousable. Moves all extremities  spontaneously. Psych: Normal affect.  Labs    Chemistry Recent Labs Lab 03/18/17 1933 03/20/17 0315 03/21/17 0310 03/22/17 0106  NA 137 132* 134* 134*  K 4.5 4.5 3.9 3.8  CL 108 105 104 103  CO2 24 21* 24 25  GLUCOSE 197* 340* 200* 178*  BUN 23* 20 14 21*  CREATININE 1.59* 1.52* 1.26* 1.35*  CALCIUM 8.8* 7.3* 7.8* 7.8*  PROT 5.9*  --   --   --   ALBUMIN 4.0  --   --   --   AST 21  --   --   --   ALT 13*  --   --   --   ALKPHOS 68  --   --   --   BILITOT 1.5*  --   --   --   GFRNONAA 40* 42* 53* 49*  GFRAA 47* 49* >60 57*  ANIONGAP 5 6 6 6      Hematology Recent Labs Lab 03/23/17 0255 03/23/17 1836 03/24/17 0249 03/25/17 0250  WBC 8.4  --  7.2 9.2  RBC 2.33*  --  2.68* 2.65*  HGB 7.2* 8.3* 8.2* 8.1*  HCT 21.3* 25.0*  24.5* 24.5*  MCV 91.4  --  91.4 92.5  MCH 30.9  --  30.6 30.6  MCHC 33.8  --  33.5 33.1  RDW 14.3  --  14.7 14.4  PLT 174  --  192 230    Cardiac Enzymes Recent Labs Lab 03/22/17 0106 03/22/17 0543 03/22/17 1214  TROPONINI 0.04* 0.04* 0.04*   No results for input(s): TROPIPOC in the last 168 hours.   BNPNo results for input(s): BNP, PROBNP in the last 168 hours.   DDimer No results for input(s): DDIMER in the last 168 hours.   Radiology    Ct Head Wo Contrast  Result Date: 03/23/2017 CLINICAL DATA:  Altered level of consciousness EXAM: CT HEAD WITHOUT CONTRAST TECHNIQUE: Contiguous axial images were obtained from the base of the skull through the vertex without intravenous contrast. COMPARISON:  None. FINDINGS: Brain: Chronic mild to moderate small vessel ischemic disease. No acute intracranial hemorrhage, midline shift or edema. No intra-axial mass nor extra-axial fluid collections. No hydrocephalus. Mild superficial and central atrophy. Vascular: Moderate atherosclerosis of the carotid siphons. No hyperdense vessels. Skull: No acute skull fracture. Sinuses/Orbits: There is mild circumferential mucosal thickening of the left maxillary sinus  with thickened maxillary sinus walls consistent with chronic sinusitis. Mild ethmoid sinus mucosal thickening. Clear sphenoid and frontal sinuses. Intact orbits and globes. Other: None IMPRESSION: 1. Chronic small vessel ischemic disease of periventricular white matter with atrophy. 2. No acute intracranial abnormality. 3. Chronic left maxillary sinusitis. Electronically Signed   By: Ashley Royalty M.D.   On: 03/23/2017 23:47     Telemetry    Sinus - Personally Reviewed  ECG    No new tracings - Personally Reviewed   Cardiac Studies   Echocardiogram 03/24/17: Study Conclusions - Left ventricle: The cavity size was normal. Wall thickness was   increased in a pattern of moderate LVH. Systolic function was   normal. The estimated ejection fraction was in the range of 50%   to 55%. Wall motion was normal; there were no regional wall   motion abnormalities. - Aortic valve: Trileaflet; mildly thickened, mildly calcified   leaflets. Valve mobility was moderately restricted. Transvalvular   velocity was increased. There was mild to moderate stenosis.   Valve area (VTI): 1.52 cm^2. Valve area (Vmax): 1.56 cm^2. Valve   area (Vmean): 1.4 cm^2. - Aortic root: The aortic root was mildly dilated. - Mitral valve: Mildly to moderately calcified annulus. There was   mild to moderate regurgitation. - Left atrium: The atrium was moderately dilated.   Echocardiogram 05/31/16 Study Conclusions - Left ventricle: The cavity size was normal. Wall thickness was increased in a pattern of mild LVH. The estimated ejection fraction was 55%. Wall motion was normal; there were no regional wall motion abnormalities. Features are consistent with a pseudonormal left ventricular filling pattern, with concomitant abnormal relaxation and increased filling pressure (grade 2 diastolic dysfunction). - Aortic valve: Trileaflet; moderately calcified leaflets. There was mild stenosis. Mean gradient (S): 13  mm Hg. Valve area (Vmax): 1.56 cm^2. - Aorta: Mildly dilated aortic root and ascending aorta. Aortic root dimension: 37 mm (ED). Ascending aortic diameter: 37 mm (S). - Mitral valve: Mildly calcified annulus. There was mild regurgitation. - Left atrium: The atrium was moderately dilated. - Right ventricle: The cavity size was normal. Systolic function was mildly reduced. - Tricuspid valve: Peak RV-RA gradient (S): 20 mm Hg. - Pulmonary arteries: PA peak pressure: 23 mm Hg (S). - Inferior vena cava: The vessel was normal  in size. The respirophasic diameter changes were in the normal range (= 50%), consistent with normal central venous pressure.  Impressions: - Normal LV size with EF 55%. Mild LV hypertrophy. Moderate diastolic dysfunction. Mild mitral regurgitation. Normal RV size with mildly decreased systolic function.  Patient Profile     76 y.o. male with a hx of CAD s/p CABG 1999, left CEA 1996, PVD, diabetes, hypertension, hyperlipidemia, history of PE in 1990s on Coumadin who is being followed for atrial fibrillation.   Assessment & Plan    1. Atrial fibrillation with RVR - echo yesterday with left atrium moderately dilated - telemetry with sinus rhythm - pt was admitted with a supratherapeutic INR on coumadin for previous PE - This patients CHA2DS2-VASc Score and unadjusted Ischemic Stroke Rate (% per year) is equal to 7.2 % stroke rate/year from a score of 5 (age, HTN, CAD, DM) - due to his anemia and supratherapeutic INR on admission, consider switching to a NOAC, such as eliquis - 5 mg BID (no dose reduction indicated yet) - restart AC when safe to do so by primary team - Hb stable - will check BMP today for kidney function - will transition cardizem 30 mg q6hr to 120 cardizem daily  I had a long discussion with the family concerning his fatigue. He was apparently unable to work with PT this morning and has been sleeping during the day and more fatigued  starting Thursday morning. The family is very concerned about the cardizem and if he is having a reaction similar to what he had with lopressor (severe hypotension). Per EPIC review, cardizem started Thursday night.  BP is stable and Hb is stable today. Will defer fatigue workup to primary team. If workup negative, we can consider switching to an antiarrhythmic, such as amiodarone. However, we do not want to risk paroxysmal afib during this period of time when he is off of anticoagulation and recovering from his anemia and chest wall hematoma. Recovering from anemia with Hb in the low 8's (from 6.4) can certainly contribute to this fatigue.  Will discuss options with attending.     2. Mitral regurgitation - mild to moderate by echo yesterday   3. HTN - continue home norvasc and benzaepril - monitor pressures with transition to daily cardizem   4. HLD - continue statin   Signed, Ledora Bottcher , PA-C 10:14 AM 03/25/2017 Pager: [phone]

## 2017-03-25 NOTE — Progress Notes (Signed)
RN was called into room by physical therapy stating that patient had a facial droop. When RN assessed patient, mouth symmetrical when pt asked to smile, tongue midline, and pt A&Ox4. Carolin Sicks, MD made aware of patient changes and new orders to be placed. RN will continue to monitor the patient.

## 2017-03-26 LAB — BASIC METABOLIC PANEL
ANION GAP: 6 (ref 5–15)
BUN: 25 mg/dL — ABNORMAL HIGH (ref 6–20)
CALCIUM: 7.7 mg/dL — AB (ref 8.9–10.3)
CHLORIDE: 101 mmol/L (ref 101–111)
CO2: 24 mmol/L (ref 22–32)
CREATININE: 1.32 mg/dL — AB (ref 0.61–1.24)
GFR calc Af Amer: 58 mL/min — ABNORMAL LOW (ref >60)
GFR calc non Af Amer: 50 mL/min — ABNORMAL LOW (ref >60)
GLUCOSE: 159 mg/dL — AB (ref 65–99)
Potassium: 3.5 mmol/L (ref 3.5–5.1)
Sodium: 131 mmol/L — ABNORMAL LOW (ref 135–145)

## 2017-03-26 LAB — CBC
HEMATOCRIT: 24.2 % — AB (ref 39.0–52.0)
Hemoglobin: 7.9 g/dL — ABNORMAL LOW (ref 13.0–17.0)
MCH: 30 pg (ref 26.0–34.0)
MCHC: 32.6 g/dL (ref 30.0–36.0)
MCV: 92 fL (ref 78.0–100.0)
Platelets: 275 10*3/uL (ref 150–400)
RBC: 2.63 MIL/uL — ABNORMAL LOW (ref 4.22–5.81)
RDW: 13.9 % (ref 11.5–15.5)
WBC: 9.7 10*3/uL (ref 4.0–10.5)

## 2017-03-26 LAB — GLUCOSE, CAPILLARY
GLUCOSE-CAPILLARY: 175 mg/dL — AB (ref 65–99)
GLUCOSE-CAPILLARY: 184 mg/dL — AB (ref 65–99)
Glucose-Capillary: 176 mg/dL — ABNORMAL HIGH (ref 65–99)
Glucose-Capillary: 244 mg/dL — ABNORMAL HIGH (ref 65–99)

## 2017-03-26 MED ORDER — APIXABAN 5 MG PO TABS
5.0000 mg | ORAL_TABLET | Freq: Two times a day (BID) | ORAL | Status: DC
  Administered 2017-03-26 – 2017-03-27 (×3): 5 mg via ORAL
  Filled 2017-03-26 (×2): qty 1

## 2017-03-26 NOTE — Care Management Note (Signed)
Case Management Note Previous Note Created by Carles Collet  Patient Details  Name: Bryan Carney MRN: [id number] Date of Birth: 08/22/1940  Subjective/Objective:                 Spoke with patient's daughter. She states that prior to admission her father walked several miles a day and was very active and independent. Se is unsure at this time if we will actually need HH by time of DC if he continues to ambulate as IP. Discussed HH servoces in [facility] and will touch base again prior to DC if [facility] services are still recommended.  Action/Plan:  CM will continue to follow.  Expected Discharge Date:  03/23/17               Expected Discharge Plan:  [place]  In-House Referral:     Discharge planning Services  CM Consult  Post Acute Care Choice:    Choice offered to:  Adult Children  DME Arranged:    DME Agency:     HH Arranged:    HH Agency:     Status of Service:  In process, will continue to follow  If discussed at Long Length of Stay Meetings, dates discussed:    Additional Comments: 03/26/2017 Pt is now being recommended for CIR - CSW following for back up plan Maryclare Labrador, RN 03/26/2017, 2:18 PM

## 2017-03-26 NOTE — Progress Notes (Signed)
PROGRESS NOTE    Bryan Carney  WVP:[id number] DOB: [birth year] DOA: 03/20/2017 PCP: [name], NP   Brief Narrative: 76 year old male with history of coronary artery disease, peripheral vascular disease, diabetes, hypertension, history of PE in 1990s on Coumadin presented with right chest wall swelling, bruises and hematoma. In the ER patient with hemoglobin of 6.4 with INR 4.8. Received FFP, noted blood cell and vitamin K. Admitted for further evaluation.  Assessment & Plan:  # Spontaneous right chest wall hematoma in the setting of Coumadin: -Patient received FFP and vitamin K in the ER.  -Patient received total 3 units of red blood cell.  --Patient was evaluated by general surgery and IR. Continue chest binder and conservative management.   Patient denied chest pain or shortness of breath.  # Acute blood loss anemia: Mild drop in hemoglobin notice today, do not think patient has active bleed. Continue to CBC.  #History of PE on Coumadin: No known history of DVT. PE was in 1999 in the setting of CABG surgery.  Supratherapeutic INR on admission. No need for Coumadin for PE.  # New onset atrial fibrillation with RVR:  -Heart rate is currently controlled on oral diltiazem. Echocardiogram unremarkable. Cardiology following. Resume Eliquis today by Cardiology. Recommended outpatient follow-up. I have discussed this with patient,  his wife and his  daughter over the phone.  #Diabetes type 2: Continue sliding scale and low-dose Lantus. Monitor blood sugar level.  #History of coronary artery disease: Continue Zocor. May be able to resume aspirin in few days when hematoma improves.  -Patient also with mild elevation in troponin level. Follow-up cardiology.  #Hypertension: Blood pressure acceptable. Continue Lotensin and diltiazem. Dc norvasc  #Dyslipidemia: Continue Lipitor  #Acute kidney injury: Serum creatinine level stable. Unknown patient's baseline serum creatinine level. He may have  chronic kidney disease. 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 Recommend outpatient follow-up. -Monitor BMP. Avoid nephrotoxins  #Altered mental status likely acute delirium. Patient has fluctuation in mental status. He was alert awake and following commands. Evaluated by PT OT recommended inpatient rehabilitation. I have discussed with the patient's wife and his daughter today. Also discussed with the case manager. Inpatient rehabilitation evaluating for possible admission when insurance approve. TSH level acceptable Ammonia not elevated CT head  no acute finding.   #Swelling of right upper extremity due to superficial thrombosis. Continue supportive care, elevation and warm compresses. The pain and swelling is gradually improving.   DVT prophylaxis: SCD and resume Eliquis Code Status: Full code Family Communication: Discussed with patient's wife at bedside. Called patient's daughter to update. Disposition Plan: Current admitted and likely transfer to inpatient rehabilitation in 1-2 days    Consultants:   Pulmonologist  General surgery   IR  Cardiology  Procedures: None Antimicrobials: None  Subjective: Seen and examined at bedside. He has fluctuation in mental status. He was alert awake and oriented this morning. He has weakness. Not really walking. Denied headache, dizziness, chest pain, shortness of breath. His wife at bedside. Objective: Vitals:   03/25/17 2100 03/26/17 0000 03/26/17 0400 03/26/17 0744  BP:  139/62 (!) 148/70 (!) 144/84  Pulse: 73 75 75 77  Resp: 20 20 19 14   Temp: 98.2 F (36.8 C) 99.1 F (37.3 C) 98.5 F (36.9 C) 98.2 F (36.8 C)  TempSrc:   Oral Oral  SpO2: 93% 94% 91% 95%  Weight:      Height:  Intake/Output Summary (Last 24 hours) at 03/26/17 1108 Last data filed at 03/26/17 0900  Gross per 24 hour  Intake              240 ml  Output             1250 ml  Net             -1010 ml   Filed Weights   03/20/17 1616 03/23/17 0605  Weight: 63 kg (138 lb 14.2 oz) 64.9 kg (143 lb 1.3 oz)    Examination:  General exam: Lying on bed comfortable, not in distress Respiratory system: Clear bilateral, no wheezing or crackle Right chest wall has binder, the hematoma is improving Cardiovascular system: Regular rate and rhythm, S1-S2 normal. No pedal edema. Gastrointestinal system: Abdomen soft, nontender, nondistended. Thousand positive Central nervous system: Alert and awake. Muscle strength 5 over 5 in all extremities Skin: No rashes, lesions or ulcers Right upper extremity mild swollen mildly improved   Data Reviewed: I have personally reviewed following labs and imaging studies  CBC:  Recent Labs Lab 03/22/17 0106 03/23/17 0255 03/23/17 1836 03/24/17 0249 03/25/17 0250 03/26/17 0148  WBC 10.6* 8.4  --  7.2 9.2 9.7  HGB 7.7* 7.2* 8.3* 8.2* 8.1* 7.9*  HCT 22.6* 21.3* 25.0* 24.5* 24.5* 24.2*  MCV 91.1 91.4  --  91.4 92.5 92.0  PLT 126* 174  --  192 230 601   Basic Metabolic Panel:  Recent Labs Lab 03/20/17 0315 03/21/17 0310 03/22/17 0106 03/25/17 1314 03/26/17 0148  NA 132* 134* 134* 132* 131*  K 4.5 3.9 3.8 3.7 3.5  CL 105 104 103 102 101  CO2 21* 24 25 24 24   GLUCOSE 340* 200* 178* 244* 159*  BUN 20 14 21* 29* 25*  CREATININE 1.52* 1.26* 1.35* 1.33* 1.32*  CALCIUM 7.3* 7.8* 7.8* 7.8* 7.7*  MG  --   --  1.9  --   --    GFR: Estimated Creatinine Clearance: 42.3 mL/min (A) (by C-G formula based on SCr of 1.32 mg/dL (H)). Liver Function Tests: No results for input(s): AST, ALT, ALKPHOS, BILITOT, PROT, ALBUMIN in the last 168 hours. No results for input(s): LIPASE, AMYLASE in the last 168 hours.  Recent Labs Lab 03/22/17 1614  AMMONIA 17   Coagulation Profile:  Recent Labs Lab 03/20/17 0331 03/20/17 1954 03/21/17 0310 03/22/17 0106  INR 4.82* 1.28 1.21 1.11   Cardiac Enzymes:  Recent Labs Lab 03/22/17 0106  03/22/17 0543 03/22/17 1214  TROPONINI 0.04* 0.04* 0.04*   BNP (last 3 results) No results for input(s): PROBNP in the last 8760 hours. HbA1C: No results for input(s): HGBA1C in the last 72 hours. CBG:  Recent Labs Lab 03/25/17 0827 03/25/17 1138 03/25/17 1703 03/25/17 2120 03/26/17 0752  GLUCAP 221* 344* 179* 239* 176*   Lipid Profile: No results for input(s): CHOL, HDL, LDLCALC, TRIG, CHOLHDL, LDLDIRECT in the last 72 hours. Thyroid Function Tests: No results for input(s): TSH, T4TOTAL, FREET4, T3FREE, THYROIDAB in the last 72 hours. Anemia Panel: No results for input(s): VITAMINB12, FOLATE, FERRITIN, TIBC, IRON, RETICCTPCT in the last 72 hours. Sepsis Labs: No results for input(s): PROCALCITON, LATICACIDVEN in the last 168 hours.  Recent Results (from the past 240 hour(s))  MRSA PCR Screening     Status: None   Collection Time: 03/20/17  5:08 PM  Result Value Ref Range Status   MRSA by PCR NEGATIVE NEGATIVE Final    Comment:  The GeneXpert MRSA Assay (FDA approved for NASAL specimens only), is one component of a comprehensive MRSA colonization surveillance program. It is not intended to diagnose MRSA infection nor to guide or monitor treatment for MRSA infections.          Radiology Studies: Ct Head Wo Contrast  Result Date: 03/25/2017 CLINICAL DATA:  76 year old male with unexplained altered mental status. EXAM: CT HEAD WITHOUT CONTRAST TECHNIQUE: Contiguous axial images were obtained from the base of the skull through the vertex without intravenous contrast. COMPARISON:  03/23/2017 and earlier. FINDINGS: Brain: Cerebral volume is within normal limits for age. No midline shift, ventriculomegaly, mass effect, evidence of mass lesion, intracranial hemorrhage or evidence of cortically based acute infarction. Mild for age scattered white matter hypodensity, including in the anterior limb of the left internal capsule. No cortical encephalomalacia identified.  Stable gray-white matter differentiation. Vascular: Fairly extensive Calcified atherosclerosis at the skull base. No suspicious intracranial vascular hyperdensity. Skull: No acute osseous abnormality identified. Sinuses/Orbits: Chronic left maxillary sinus mucoperiosteal thickening. Other visible paranasal sinuses and mastoids are stable and well pneumatized. Other: Calcified atherosclerosis of the scalp vasculature. No acute orbit or scalp soft tissue findings. IMPRESSION: No acute intracranial abnormality and stable non contrast CT appearance of the brain since 03/23/2017. Electronically Signed   By: Genevie Ann M.D.   On: 03/25/2017 12:43        Scheduled Meds: . amitriptyline  10 mg Oral QHS  . apixaban  5 mg Oral BID  . atorvastatin  20 mg Oral q1800  . benazepril  40 mg Oral Daily  . dextromethorphan-guaiFENesin  1 tablet Oral BID  . diltiazem  120 mg Oral Daily  . ferrous sulfate  325 mg Oral Q breakfast  . Influenza vac split quadrivalent PF  0.5 mL Intramuscular Tomorrow-1000  . insulin aspart  0-15 Units Subcutaneous TID WC  . insulin aspart  0-5 Units Subcutaneous QHS  . insulin glargine  6 Units Subcutaneous Daily   Continuous Infusions:    LOS: 6 days    Dron Tanna Furry, MD [facility] Pager [phone]  If 7PM-7AM, please contact night-coverage [email or web address] Password [email or web address] 03/26/2017, 11:08 AM

## 2017-03-26 NOTE — Progress Notes (Signed)
DAILY PROGRESS NOTE   Patient Name: Bryan Carney Date of Encounter: 03/26/2017  Hospital Problem List   Principal Problem:   Acute blood loss anemia Active Problems:   Essential hypertension   Coronary artery disease due to lipid rich plaque   Type 2 diabetes mellitus without complication, without long-term current use of insulin (HCC)   Hematoma   Hyponatremia   Supratherapeutic INR   Coagulopathy (HCC)   Paroxysmal atrial fibrillation (HCC)   Acute delirium   Coronary artery disease involving native coronary artery of native heart without angina pectoris   Prostate cancer [facility])    Chief Complaint   I feel better today  Subjective   No events overnight. RUE venous doppler negative for DVT, however, superficial vein thrombus noted in the cephalic vein. H/H fairly stable. More alert today - has had delirium. Now more than 1 weeks since he had bleeding.  Objective   Vitals:   03/25/17 2100 03/26/17 0000 03/26/17 0400 03/26/17 0744  BP:  139/62 (!) 148/70 (!) 144/84  Pulse: 73 75 75 77  Resp: _0 Temp: 98.2 F (36.8 C) 99.1 F (37.3 C) 98.5 F (36.9 C) 98.2 F (36.8 C)  TempSrc:   Oral Oral  SpO2: 93% 94% 91% 95%  Weight:      Height:        Intake/Output Summary (Last 24 hours) at 03/26/17 0277 Last data filed at 03/26/17 0748  Gross per 24 hour  Intake                0 ml  Output              930 ml  Net             -930 ml   Filed Weights   03/20/17 1616 03/23/17 0605  Weight: 138 lb 14.2 oz (63 kg) 143 lb 1.3 oz (64.9 kg)    Physical Exam   General appearance: alert and no distress Neck: no carotid bruit, no JVD and thyroid not enlarged, symmetric, no tenderness/mass/nodules Lungs: clear to auscultation bilaterally Heart: irregularly irregular rhythm Abdomen: soft, non-tender; bowel sounds normal; no masses,  no organomegaly Extremities: RUE hematoma, ecchymosis Pulses: 2+ and symmetric Skin: Skin color, texture, turgor normal. No  rashes or lesions Neurologic: Mental status: Alert, oriented, thought content appropriate Psych: Pleasant  Inpatient Medications    Scheduled Meds: . amitriptyline  10 mg Oral QHS  . atorvastatin  20 mg Oral q1800  . benazepril  40 mg Oral Daily  . dextromethorphan-guaiFENesin  1 tablet Oral BID  . diltiazem  120 mg Oral Daily  . ferrous sulfate  325 mg Oral Q breakfast  . Influenza vac split quadrivalent PF  0.5 mL Intramuscular Tomorrow-1000  . insulin aspart  0-15 Units Subcutaneous TID WC  . insulin aspart  0-5 Units Subcutaneous QHS  . insulin glargine  6 Units Subcutaneous Daily    Continuous Infusions:   PRN Meds: ondansetron **OR** ondansetron (ZOFRAN) IV   Labs   Results for orders placed or performed during the hospital encounter of 03/20/17 (from the past 48 hour(s))  Glucose, capillary     Status: Abnormal   Collection Time: 03/24/17 11:48 AM  Result Value Ref Range   Glucose-Capillary 232 (H) 65 - 99 mg/dL   Comment 1 Notify RN    Comment 2 Document in Chart   Glucose, capillary     Status: Abnormal   Collection Time: 03/24/17  3:37  PM  Result Value Ref Range   Glucose-Capillary 273 (H) 65 - 99 mg/dL   Comment 1 Notify RN    Comment 2 Document in Chart   Glucose, capillary     Status: Abnormal   Collection Time: 03/24/17  9:25 PM  Result Value Ref Range   Glucose-Capillary 222 (H) 65 - 99 mg/dL  CBC     Status: Abnormal   Collection Time: 03/25/17  2:50 AM  Result Value Ref Range   WBC 9.2 4.0 - 10.5 K/uL   RBC 2.65 (L) 4.22 - 5.81 MIL/uL   Hemoglobin 8.1 (L) 13.0 - 17.0 g/dL   HCT 24.5 (L) 39.0 - 52.0 %   MCV 92.5 78.0 - 100.0 fL   MCH 30.6 26.0 - 34.0 pg   MCHC 33.1 30.0 - 36.0 g/dL   RDW 14.4 11.5 - 15.5 %   Platelets 230 150 - 400 K/uL  Glucose, capillary     Status: Abnormal   Collection Time: 03/25/17  8:27 AM  Result Value Ref Range   Glucose-Capillary 221 (H) 65 - 99 mg/dL   Comment 1 Notify RN    Comment 2 Document in Chart   Brain  natriuretic peptide     Status: Abnormal   Collection Time: 03/25/17 10:13 AM  Result Value Ref Range   B Natriuretic Peptide 410.3 (H) 0.0 - 100.0 pg/mL  Glucose, capillary     Status: Abnormal   Collection Time: 03/25/17 11:38 AM  Result Value Ref Range   Glucose-Capillary 344 (H) 65 - 99 mg/dL   Comment 1 Notify RN    Comment 2 Document in Chart   Basic metabolic panel     Status: Abnormal   Collection Time: 03/25/17  1:14 PM  Result Value Ref Range   Sodium 132 (L) 135 - 145 mmol/L   Potassium 3.7 3.5 - 5.1 mmol/L   Chloride 102 101 - 111 mmol/L   CO2 24 22 - 32 mmol/L   Glucose, Bld 244 (H) 65 - 99 mg/dL   BUN 29 (H) 6 - 20 mg/dL   Creatinine, Ser 1.33 (H) 0.61 - 1.24 mg/dL   Calcium 7.8 (L) 8.9 - 10.3 mg/dL   GFR calc non Af Amer 50 (L) >60 mL/min   GFR calc Af Amer 58 (L) >60 mL/min    Comment: (NOTE) The eGFR has been calculated using the CKD EPI equation. This calculation has not been validated in all clinical situations. eGFR's persistently <60 mL/min signify possible Chronic Kidney Disease.    Anion gap 6 5 - 15  Glucose, capillary     Status: Abnormal   Collection Time: 03/25/17  5:03 PM  Result Value Ref Range   Glucose-Capillary 179 (H) 65 - 99 mg/dL   Comment 1 Notify RN    Comment 2 Document in Chart   Glucose, capillary     Status: Abnormal   Collection Time: 03/25/17  9:20 PM  Result Value Ref Range   Glucose-Capillary 239 (H) 65 - 99 mg/dL  CBC     Status: Abnormal   Collection Time: 03/26/17  1:48 AM  Result Value Ref Range   WBC 9.7 4.0 - 10.5 K/uL   RBC 2.63 (L) 4.22 - 5.81 MIL/uL   Hemoglobin 7.9 (L) 13.0 - 17.0 g/dL   HCT 24.2 (L) 39.0 - 52.0 %   MCV 92.0 78.0 - 100.0 fL   MCH 30.0 26.0 - 34.0 pg   MCHC 32.6 30.0 - 36.0 g/dL   RDW  13.9 11.5 - 15.5 %   Platelets 275 150 - 400 K/uL  Basic metabolic panel     Status: Abnormal   Collection Time: 03/26/17  1:48 AM  Result Value Ref Range   Sodium 131 (L) 135 - 145 mmol/L   Potassium 3.5 3.5 -  5.1 mmol/L   Chloride 101 101 - 111 mmol/L   CO2 24 22 - 32 mmol/L   Glucose, Bld 159 (H) 65 - 99 mg/dL   BUN 25 (H) 6 - 20 mg/dL   Creatinine, Ser 1.32 (H) 0.61 - 1.24 mg/dL   Calcium 7.7 (L) 8.9 - 10.3 mg/dL   GFR calc non Af Amer 50 (L) >60 mL/min   GFR calc Af Amer 58 (L) >60 mL/min    Comment: (NOTE) The eGFR has been calculated using the CKD EPI equation. This calculation has not been validated in all clinical situations. eGFR's persistently <60 mL/min signify possible Chronic Kidney Disease.    Anion gap 6 5 - 15  Glucose, capillary     Status: Abnormal   Collection Time: 03/26/17  7:52 AM  Result Value Ref Range   Glucose-Capillary 176 (H) 65 - 99 mg/dL    ECG   N/A  Telemetry   A-fib with CVR - Personally Reviewed  Radiology    Ct Head Wo Contrast  Result Date: 03/25/2017 CLINICAL DATA:  76 year old male with unexplained altered mental status. EXAM: CT HEAD WITHOUT CONTRAST TECHNIQUE: Contiguous axial images were obtained from the base of the skull through the vertex without intravenous contrast. COMPARISON:  03/23/2017 and earlier. FINDINGS: Brain: Cerebral volume is within normal limits for age. No midline shift, ventriculomegaly, mass effect, evidence of mass lesion, intracranial hemorrhage or evidence of cortically based acute infarction. Mild for age scattered white matter hypodensity, including in the anterior limb of the left internal capsule. No cortical encephalomalacia identified. Stable gray-white matter differentiation. Vascular: Fairly extensive Calcified atherosclerosis at the skull base. No suspicious intracranial vascular hyperdensity. Skull: No acute osseous abnormality identified. Sinuses/Orbits: Chronic left maxillary sinus mucoperiosteal thickening. Other visible paranasal sinuses and mastoids are stable and well pneumatized. Other: Calcified atherosclerosis of the scalp vasculature. No acute orbit or scalp soft tissue findings. IMPRESSION: No acute  intracranial abnormality and stable non contrast CT appearance of the brain since 03/23/2017. Electronically Signed   By: Genevie Ann M.D.   On: 03/25/2017 12:43    Cardiac Studies   N/A  Assessment   1. Principal Problem: 2.   Acute blood loss anemia 3. Active Problems: 4.   Essential hypertension 5.   Coronary artery disease due to lipid rich plaque 6.   Type 2 diabetes mellitus without complication, without long-term current use of insulin (HCC) 7.   Hematoma 8.   Hyponatremia 9.   Supratherapeutic INR 10.   Coagulopathy ([place]) 11.   Paroxysmal atrial fibrillation (HCC) 12.   Acute delirium 13.   Coronary artery disease involving native coronary artery of native heart without angina pectoris 14.   Prostate cancer ([place]) 15.   Plan   More clear today - typical of delirium which I explained to his wife. I think CIR is a good idea, but she wants him at home. Would recommend starting Eliquis 5 mg BID (age <80, creatinine <1.5, weight >60 kg). It has now been over 1 week since his bleeding with stable H/H. Would monitor H/H again in a few days if he goes to CIR.  No further suggestions at this time. Follow-up with Dr.  Berry after discharge. Cardiology will sign-off.  Time Spent Directly with Patient:  I have spent a total of 15 minutes with the patient reviewing hospital notes, telemetry, EKGs, labs and examining the patient as well as establishing an assessment and plan that was discussed personally with the patient. > 50% of time was spent in direct patient care.  Length of Stay:  LOS: 6 days   Pixie Casino, MD, [place]  Attending Cardiologist  Direct Dial: [phone]  Fax: [phone]  Website:  [email or web address] 03/26/2017, 8:38 AM

## 2017-03-26 NOTE — Progress Notes (Signed)
I met with pt and his wife at bedside to discuss a possible inpt rehab admit. They are both in agreement and would prefer inpt rehab rather than SNF. I have ordered an OT consult and called to request the eval be completed as soon as possible so that I can pursue [facility] insurance approval for admission. I will begin authorization this morning and follow up with insurance decision. RN CM is aware of plan.[phone]

## 2017-03-26 NOTE — Progress Notes (Signed)
Ive received OT eval so I will pursue insurance approval for CIR. I will follow up in the morning. [phone]

## 2017-03-26 NOTE — Progress Notes (Signed)
Physical Therapy Treatment Patient Details Name: Bryan Carney MRN: [id number] DOB: 26-Jul-1940 Today's Date: 03/26/2017    History of Present Illness Bryan Carney is a 76 y.o. male with PMH significant for CAD/PVD on aspirin, NIDDM, HTN, & history of PE in 1990s on Coumadin, presented with right chest wall swelling, bruises and hematoma on 03/20/17. Determined to have spontaneous right chest wall hematoma in the setting of Coumadin. Other PMH includes squamous cell carcinoma, HLD.    PT Comments    Pt progressing with mobility; remains motivated to participate with therapies. Requires modA+2 for ambulation in room with RW secondary to increasing fatigue and bilat knee buckling; decreased BP with mobility (see below). With seated rest break, pt able to recover, perform a second sit<>stand and complete seated LE therex. Able to utilize RUE throughout session with max encouragement, but continues to demonstrate functional weakness. Still feel pt would best benefit from CIR level therapies post-acute d/c to maximize functional mobility and return to independent PLOF. Wife present throughout session and very supportive. Will continue to follow acutely.   Follow Up Recommendations  Supervision/Assistance - 24 hour;CIR     Equipment Recommendations  Other (comment) (Defer to next venue)    Recommendations for Other Services       Precautions / Restrictions Precautions Precautions: Fall Required Braces or Orthoses: Other Brace/Splint Other Brace/Splint: Breast binder over R chest hematoma Restrictions Weight Bearing Restrictions: No    Mobility  Bed Mobility Overal bed mobility: Needs Assistance Bed Mobility: Rolling;Sidelying to Sit Rolling: Max assist;+2 for physical assistance Sidelying to sit: Max assist;+2 for physical assistance       General bed mobility comments: Max assist for trunk and B LE management.   Transfers Overall transfer level: Needs assistance Equipment used:  Rolling walker (2 wheeled);2 person hand held assist Transfers: Sit to/from Stand Sit to Stand: +2 physical assistance;Min assist;Mod assist;Max assist         General transfer comment: Pt able to stand with minA initially utilizing RW and able to amb to window. Pt requiring seated rest break at that time due to B LE weakness requiring up to max assist to lower to sitting. After seated rest break, able to recover and complete second sit<>stand.   Ambulation/Gait Ambulation/Gait assistance: Mod assist;+2 physical assistance Ambulation Distance (Feet): 5 Feet Assistive device: Rolling walker (2 wheeled) Gait Pattern/deviations: Shuffle;Trunk flexed;Narrow base of support Gait velocity: Decreased Gait velocity interpretation: <1.8 ft/sec, indicative of risk for recurrent falls General Gait Details: Amb in room with RW and modA+2, requiring seated rest break secondary to BLE weakness and bilat knee buckling, requiring maxA to lower into sitting. Able to recover and complete second sit<>stand with RW and minA+2   Stairs            Wheelchair Mobility    Modified Rankin (Stroke Patients Only)       Balance Overall balance assessment: Needs assistance Sitting-balance support: Feet supported;No upper extremity supported Sitting balance-Leahy Scale: Fair Sitting balance - Comments: Sat EOB >93min without UE support and min guard   Standing balance support: Bilateral upper extremity supported;No upper extremity supported Standing balance-Leahy Scale: Poor Standing balance comment: Relies on B UE suppor.t                             Cognition Arousal/Alertness: Lethargic Behavior During Therapy: Flat affect Overall Cognitive Status: Impaired/Different from baseline Area of Impairment: Memory;Safety/judgement;Following commands;Problem solving  Memory: Decreased short-term memory Following Commands: Follows one step commands with increased  time;Follows one step commands inconsistently Safety/Judgement: Decreased awareness of deficits;Decreased awareness of safety   Problem Solving: Slow processing;Decreased initiation;Difficulty sequencing;Requires verbal cues;Requires tactile cues General Comments: Pt at times slow to respond but waxes and wanes cognitively. At times very appropriate and able to maintain conversation, problem solve, and follow commands. However, will demonstrate decreased responsivity at times and simultaneously with poor ability to sequence and plan movement. Of note, during first episode SpO2 desaturation to 85% but when OT/PT engaged pt in conversation, SpO2 rebounded to >92% immediately.       Exercises General Exercises - Upper Extremity Shoulder Flexion: Right;10 reps;Seated;PROM (0-45 degrees) Elbow Flexion: AAROM;Right;10 reps;Seated Elbow Extension: AAROM;Right;10 reps;Seated Digit Composite Flexion: Strengthening;Right;10 reps;Squeeze ball General Exercises - Lower Extremity Ankle Circles/Pumps: AROM;Both;5 reps Long Arc Quad: AROM;Both;20 reps;Seated Hip Flexion/Marching: AROM;Both;20 reps;Seated    General Comments General comments (skin integrity, edema, etc.): Serial BP as follows during activity: supine - 144/55, sitting - 136/64, seated after ambulation - 101/56, seated after 1 minute recovery - 120/60, after standing second time - 111/52, sitting for 1-2 minutes 118/63      Pertinent Vitals/Pain Pain Assessment: Faces Faces Pain Scale: Hurts even more Pain Location: R UE Pain Descriptors / Indicators: Aching;Grimacing;Guarding Pain Intervention(s): Limited activity within patient's tolerance;Monitored during session;Repositioned    Home Living Family/patient expects to be discharged to:: Private residence Living Arrangements: Spouse/significant other Available Help at Discharge: Family;Available 24 hours/day Type of Home: House Home Access: Level entry   Home Layout: One  level Home Equipment: None      Prior Function Level of Independence: Independent      Comments: owns car dealership, works full time, driver   PT Goals (current goals can now be found in the care plan section) Acute Rehab PT Goals Patient Stated Goal: rehab then home PT Goal Formulation: With patient Time For Goal Achievement: 03/29/17 Potential to Achieve Goals: Good Progress towards PT goals: Progressing toward goals    Frequency    Min 3X/week      PT Plan Current plan remains appropriate    Co-evaluation PT/OT/SLP Co-Evaluation/Treatment: Yes Reason for Co-Treatment: Complexity of the patient's impairments (multi-system involvement);To address functional/ADL transfers;For patient/therapist safety PT goals addressed during session: Mobility/safety with mobility;Balance;Proper use of DME        AM-PAC PT "6 Clicks" Daily Activity  Outcome Measure  Difficulty turning over in bed (including adjusting bedclothes, sheets and blankets)?: Unable Difficulty moving from lying on back to sitting on the side of the bed? : Unable Difficulty sitting down on and standing up from a chair with arms (e.g., wheelchair, bedside commode, etc,.)?: A Little Help needed moving to and from a bed to chair (including a wheelchair)?: A Lot Help needed walking in hospital room?: A Lot Help needed climbing 3-5 steps with a railing? : Total 6 Click Score: 10    End of Session Equipment Utilized During Treatment: Gait belt Activity Tolerance: Patient tolerated treatment well;Patient limited by fatigue Patient left: in chair;with call bell/phone within reach;with family/visitor present Nurse Communication: Mobility status PT Visit Diagnosis: Other abnormalities of gait and mobility (R26.89);Muscle weakness (generalized) (M62.81);Difficulty in walking, not elsewhere classified (R26.2)     Time: 1330-1405 PT Time Calculation (min) (ACUTE ONLY): 35 min  Charges:  $Therapeutic Activity: 8-22  mins                    G Codes:  Mabeline Caras, PT, DPT Acute Rehab Services  Pager: [place] 03/26/2017, 3:09 PM

## 2017-03-26 NOTE — Evaluation (Signed)
Occupational Therapy Evaluation Patient Details Name: JAMONI HEWES MRN: [id number] DOB: [birth year] Today's Date: 03/26/2017    History of Present Illness ANTWIAN SANTAANA is a 76 y.o. male with a past medical history significant for hx of PE on warfarin, CAD/PVD on aspirin, NIDDM, and HTN who presents with spontaneous chest wall hematoma and now syncope.Pt on coumadin for PE,  presented to the ED on 9/24 with chest wall ecchymosis/bruising Large right pectoralis/sub pectoralis chest wall hematoma. No active extravasation.   Dependent atelectasis in the lung bases, otherwise no acute. PMH for CAD, hx of PE, HTN, hyperlipedemia   Clinical Impression   PTA, pt was independent with ADL and functional mobility and working full time selling cars. Pt currently requires mod assist for UB ADL, max assist for LB ADL, and min assist for grooming and self-feeding tasks. Pt able to complete simulated toilet transfer with RW taking a few steps with min assist +2 for safety. Pt requiring up to max assist to sequence sitting from standing as he did experience knee buckling and weakness with associated drop in BP. With seated rest break, pt able to recover and complete second sit<>stand transfer. Pt demonstrates decreased functional use of R UE due to pain this session and educated pt and wife on HEP and importance of incorporation of R UE into functional tasks. Pt is highly motivated to participate with OT and to return to independent PLOF. Pt would benefit from continued OT services while admitted to improve independence with ADL and functional mobility. Feel pt would best benefit from CIR level therapies post-acute D/C to maximize return to independent PLOF. Will continue to follow while admitted.    Follow Up Recommendations  CIR;Supervision/Assistance - 24 hour    Equipment Recommendations  Other (comment) (TBD at next venue of care)    Recommendations for Other Services Rehab consult     Precautions /  Restrictions Precautions Required Braces or Orthoses: Other Brace/Splint (breast binder over hematoma) Restrictions Weight Bearing Restrictions: No      Mobility Bed Mobility Overal bed mobility: Needs Assistance Bed Mobility: Rolling;Sidelying to Sit Rolling: Max assist;+2 for physical assistance Sidelying to sit: Max assist;+2 for physical assistance       General bed mobility comments: Max assist for trunk and B LE management.   Transfers Overall transfer level: Needs assistance Equipment used: Rolling walker (2 wheeled);2 person hand held assist Transfers: Sit to/from [facility] Sit to Stand: +2 physical assistance;Min assist;Mod assist;Max assist         General transfer comment: Pt able to stand with min assist initially utilizing RW and able to ambulate to window. Pt requiring seated rest break at that time due to B LE weakness requiring up to max assist to lower to sitting. After seated rest break, able to recover and complete second sit<>stand.     Balance Overall balance assessment: Needs assistance Sitting-balance support: Feet supported;No upper extremity supported Sitting balance-Leahy Scale: Fair     Standing balance support: Bilateral upper extremity supported;No upper extremity supported Standing balance-Leahy Scale: Poor Standing balance comment: Relies on B UE suppor.t                            ADL either performed or assessed with clinical judgement   ADL Overall ADL's : Needs assistance/impaired Eating/Feeding: Minimal assistance;Sitting   Grooming: Minimal assistance;Sitting   Upper Body Bathing: Moderate assistance;Sitting   Lower Body Bathing: Maximal assistance;Sit to/from stand  Upper Body Dressing : Moderate assistance;Sitting   Lower Body Dressing: Maximal assistance;Sit to/from stand   Toilet Transfer: Minimal assistance;+2 for physical assistance;Moderate assistance;Maximal assistance Toilet Transfer  Details (indicate cue type and reason): Min assist +2 for safety on initial stand. Pt becoming fatigued and requiring up to max assist to sit in chair. Poor motor planning noted with descent.  Toileting- Clothing Manipulation and Hygiene: Moderate assistance;Sit to/from stand       Functional mobility during ADLs: Minimal assistance;Moderate assistance;Maximal assistance;+2 for physical assistance General ADL Comments: Educated pt and wife on incorporation of R UE into functional tasks to improve recovery.      Vision Patient Visual Report: No change from baseline Vision Assessment?: Yes Eye Alignment: Within Functional Limits Ocular Range of Motion: Within Functional Limits Alignment/Gaze Preference: Within Defined Limits Tracking/Visual Pursuits: Decreased smoothness of horizontal tracking Saccades: Within functional limits Visual Fields: No apparent deficits Additional Comments: Decreased smoothness of horizontal tracking.      Perception     Praxis      Pertinent Vitals/Pain Pain Assessment: Faces Faces Pain Scale: Hurts even more Pain Location: R UE Pain Descriptors / Indicators: Aching;Grimacing;Guarding Pain Intervention(s): Limited activity within patient's tolerance;Monitored during session;Repositioned     Hand Dominance     Extremity/Trunk Assessment Upper Extremity Assessment Upper Extremity Assessment: RUE deficits/detail RUE Deficits / Details: Pain with movement due to dematoma. Pt guarding initially but with relaxation techniques able to complete AAROM R elbow and shoulder (0-45 degrees only).   Lower Extremity Assessment Lower Extremity Assessment: Generalized weakness       Communication Communication Communication: No difficulties   Cognition Arousal/Alertness: Lethargic Behavior During Therapy: Flat affect Overall Cognitive Status: Impaired/Different from baseline Area of Impairment: Memory;Safety/judgement;Following commands;Problem solving                      Memory: Decreased short-term memory Following Commands: Follows one step commands with increased time;Follows one step commands inconsistently Safety/Judgement: Decreased awareness of deficits;Decreased awareness of safety   Problem Solving: Slow processing;Decreased initiation;Difficulty sequencing;Requires verbal cues;Requires tactile cues General Comments: Pt at times slow to respond but waxes and wanes cognitively. At times very appropriate and able to maintain conversation, problem solve, and follow commands. However, will demonstrate decreased responsivity at times and simultaneously with poor ability to sequence and plan movement. Of note, during first episode SpO2 desaturation to 85% but when OT engaged pt in conversation, SpO2 rebounded to >92% immediately.    General Comments  Serial BP as follows during activity: supine - 144/55, sitting - 136/64, seated after ambulation - 101/56, seated after 1 minute recovery - 120/60, after standing second time - 111/52, sitting for 1-2 minutes 118/63    Exercises Exercises: General Upper Extremity General Exercises - Upper Extremity Shoulder Flexion: Right;10 reps;Seated;PROM (0-45 degrees) Elbow Flexion: AAROM;Right;10 reps;Seated Elbow Extension: AAROM;Right;10 reps;Seated Digit Composite Flexion: Strengthening;Right;10 reps;Squeeze ball   Shoulder Instructions      Home Living Family/patient expects to be discharged to:: Private residence Living Arrangements: Spouse/significant other Available Help at Discharge: Family;Available 24 hours/day Type of Home: House Home Access: Level entry     Home Layout: One level     Bathroom Shower/Tub: Occupational psychologist: Handicapped height Bathroom Accessibility: Yes   Home Equipment: None          Prior Functioning/Environment Level of Independence: Independent        Comments: owns car dealership, works full time, Geophysicist/field seismologist  OT Problem  List: Decreased strength;Decreased activity tolerance;Impaired balance (sitting and/or standing);Decreased range of motion;Decreased safety awareness;Decreased knowledge of use of DME or AE;Decreased knowledge of precautions;Pain;Impaired UE functional use      OT Treatment/Interventions: Self-care/ADL training;Therapeutic exercise;Energy conservation;DME and/or AE instruction;Therapeutic activities;Patient/family education;Balance training    OT Goals(Current goals can be found in the care plan section) Acute Rehab OT Goals Patient Stated Goal: rehab then home OT Goal Formulation: With patient/family Time For Goal Achievement: 04/09/17 Potential to Achieve Goals: Good ADL Goals Pt Will Perform Grooming: with min guard assist;standing Pt Will Perform Upper Body Dressing: with supervision;sitting Pt Will Perform Lower Body Dressing: with min assist;sit to/from stand Pt Will Transfer to Toilet: with min assist;ambulating;bedside commode Pt Will Perform Toileting - Clothing Manipulation and hygiene: with min guard assist;sit to/from stand Pt/caregiver will Perform Home Exercise Program: Increased strength;Right Upper extremity;With Supervision;With written HEP provided;Increased ROM Additional ADL Goal #1: Pt will follow 3/4 one-step commands with no more than 1 VC during ADL tasks.   OT Frequency: Min 2X/week   Barriers to D/C:            Co-evaluation PT/OT/SLP Co-Evaluation/Treatment: Yes Reason for Co-Treatment: Complexity of the patient's impairments (multi-system involvement);For patient/therapist safety;To address functional/ADL transfers          AM-PAC PT "6 Clicks" Daily Activity     Outcome Measure Help from another person eating meals?: A Little Help from another person taking care of personal grooming?: A Little Help from another person toileting, which includes using toliet, bedpan, or urinal?: A Lot Help from another person bathing (including washing, rinsing,  drying)?: A Lot Help from another person to put on and taking off regular upper body clothing?: A Lot Help from another person to put on and taking off regular lower body clothing?: A Lot 6 Click Score: 14   End of Session Equipment Utilized During Treatment: Gait belt;Rolling walker Nurse Communication: Mobility status  Activity Tolerance: Patient tolerated treatment well Patient left: in chair;with call bell/phone within reach;with family/visitor present  OT Visit Diagnosis: Other abnormalities of gait and mobility (R26.89);Muscle weakness (generalized) (M62.81)                Time: 1330-1405 OT Time Calculation (min): 35 min Charges:  OT General Charges $OT Visit: 1 Visit OT Evaluation $OT Eval Moderate Complexity: 1 Mod G-Codes:     Norman Herrlich, MS OTR/L  Pager: [place] A Deslyn Cavenaugh 03/26/2017, 2:28 PM

## 2017-03-27 ENCOUNTER — Inpatient Hospital Stay (HOSPITAL_COMMUNITY)
Admission: RE | Admit: 2017-03-27 | Discharge: 2017-04-05 | DRG: 945 | Disposition: A | Discharge status: Home / Self Care | Payer: Medicare Other | Source: Intra-hospital | Attending: Physical Medicine & Rehabilitation | Admitting: Physical Medicine & Rehabilitation

## 2017-03-27 ENCOUNTER — Inpatient Hospital Stay (HOSPITAL_COMMUNITY): Payer: Medicare Other

## 2017-03-27 DIAGNOSIS — D72829 Elevated white blood cell count, unspecified: Secondary | ICD-10-CM

## 2017-03-27 DIAGNOSIS — R0902 Hypoxemia: Secondary | ICD-10-CM

## 2017-03-27 DIAGNOSIS — Z9109 Other allergy status, other than to drugs and biological substances: Secondary | ICD-10-CM

## 2017-03-27 DIAGNOSIS — N179 Acute kidney failure, unspecified: Secondary | ICD-10-CM | POA: Diagnosis present

## 2017-03-27 DIAGNOSIS — R5381 Other malaise: Principal | ICD-10-CM | POA: Diagnosis present

## 2017-03-27 DIAGNOSIS — R410 Disorientation, unspecified: Secondary | ICD-10-CM | POA: Diagnosis present

## 2017-03-27 DIAGNOSIS — Z7901 Long term (current) use of anticoagulants: Secondary | ICD-10-CM

## 2017-03-27 DIAGNOSIS — Z7982 Long term (current) use of aspirin: Secondary | ICD-10-CM

## 2017-03-27 DIAGNOSIS — S20211S Contusion of right front wall of thorax, sequela: Secondary | ICD-10-CM

## 2017-03-27 DIAGNOSIS — R9389 Abnormal findings on diagnostic imaging of other specified body structures: Secondary | ICD-10-CM

## 2017-03-27 DIAGNOSIS — E46 Unspecified protein-calorie malnutrition: Secondary | ICD-10-CM | POA: Diagnosis present

## 2017-03-27 DIAGNOSIS — J9811 Atelectasis: Secondary | ICD-10-CM

## 2017-03-27 DIAGNOSIS — Z8249 Family history of ischemic heart disease and other diseases of the circulatory system: Secondary | ICD-10-CM

## 2017-03-27 DIAGNOSIS — I10 Essential (primary) hypertension: Secondary | ICD-10-CM | POA: Diagnosis present

## 2017-03-27 DIAGNOSIS — Z833 Family history of diabetes mellitus: Secondary | ICD-10-CM | POA: Diagnosis not present

## 2017-03-27 DIAGNOSIS — M7981 Nontraumatic hematoma of soft tissue: Secondary | ICD-10-CM | POA: Diagnosis present

## 2017-03-27 DIAGNOSIS — E119 Type 2 diabetes mellitus without complications: Secondary | ICD-10-CM

## 2017-03-27 DIAGNOSIS — R5383 Other fatigue: Secondary | ICD-10-CM

## 2017-03-27 DIAGNOSIS — I2510 Atherosclerotic heart disease of native coronary artery without angina pectoris: Secondary | ICD-10-CM | POA: Diagnosis present

## 2017-03-27 DIAGNOSIS — E1151 Type 2 diabetes mellitus with diabetic peripheral angiopathy without gangrene: Secondary | ICD-10-CM | POA: Diagnosis present

## 2017-03-27 DIAGNOSIS — Z6820 Body mass index (BMI) 20.0-20.9, adult: Secondary | ICD-10-CM

## 2017-03-27 DIAGNOSIS — Z8546 Personal history of malignant neoplasm of prostate: Secondary | ICD-10-CM | POA: Diagnosis not present

## 2017-03-27 DIAGNOSIS — J690 Pneumonitis due to inhalation of food and vomit: Secondary | ICD-10-CM | POA: Diagnosis present

## 2017-03-27 DIAGNOSIS — E785 Hyperlipidemia, unspecified: Secondary | ICD-10-CM | POA: Diagnosis present

## 2017-03-27 DIAGNOSIS — D62 Acute posthemorrhagic anemia: Secondary | ICD-10-CM | POA: Diagnosis present

## 2017-03-27 DIAGNOSIS — I82621 Acute embolism and thrombosis of deep veins of right upper extremity: Secondary | ICD-10-CM | POA: Diagnosis present

## 2017-03-27 DIAGNOSIS — I480 Paroxysmal atrial fibrillation: Secondary | ICD-10-CM | POA: Diagnosis present

## 2017-03-27 DIAGNOSIS — E871 Hypo-osmolality and hyponatremia: Secondary | ICD-10-CM | POA: Diagnosis present

## 2017-03-27 DIAGNOSIS — Z7409 Other reduced mobility: Secondary | ICD-10-CM | POA: Diagnosis present

## 2017-03-27 DIAGNOSIS — E1165 Type 2 diabetes mellitus with hyperglycemia: Secondary | ICD-10-CM | POA: Diagnosis not present

## 2017-03-27 DIAGNOSIS — Z79899 Other long term (current) drug therapy: Secondary | ICD-10-CM

## 2017-03-27 DIAGNOSIS — Z951 Presence of aortocoronary bypass graft: Secondary | ICD-10-CM | POA: Diagnosis not present

## 2017-03-27 DIAGNOSIS — I8290 Acute embolism and thrombosis of unspecified vein: Secondary | ICD-10-CM

## 2017-03-27 DIAGNOSIS — R4189 Other symptoms and signs involving cognitive functions and awareness: Secondary | ICD-10-CM | POA: Diagnosis present

## 2017-03-27 LAB — CBC
HCT: 25.5 % — ABNORMAL LOW (ref 39.0–52.0)
Hemoglobin: 8.7 g/dL — ABNORMAL LOW (ref 13.0–17.0)
MCH: 31.6 pg (ref 26.0–34.0)
MCHC: 34.1 g/dL (ref 30.0–36.0)
MCV: 92.7 fL (ref 78.0–100.0)
PLATELETS: 304 10*3/uL (ref 150–400)
RBC: 2.75 MIL/uL — ABNORMAL LOW (ref 4.22–5.81)
RDW: 14 % (ref 11.5–15.5)
WBC: 10 10*3/uL (ref 4.0–10.5)

## 2017-03-27 LAB — GLUCOSE, CAPILLARY
GLUCOSE-CAPILLARY: 201 mg/dL — AB (ref 65–99)
GLUCOSE-CAPILLARY: 254 mg/dL — AB (ref 65–99)
GLUCOSE-CAPILLARY: 188 mg/dL — AB (ref 65–99)
GLUCOSE-CAPILLARY: 240 mg/dL — AB (ref 65–99)

## 2017-03-27 MED ORDER — AMITRIPTYLINE HCL 10 MG PO TABS
10.0000 mg | ORAL_TABLET | Freq: Every day | ORAL | Status: DC
  Administered 2017-04-03 – 2017-04-04 (×2): 10 mg via ORAL
  Filled 2017-03-27 (×9): qty 1

## 2017-03-27 MED ORDER — ALUM & MAG HYDROXIDE-SIMETH 200-200-20 MG/5ML PO SUSP: 30.0000 mL | ORAL | Status: DC | PRN

## 2017-03-27 MED ORDER — INSULIN ASPART 100 UNIT/ML ~~LOC~~ SOLN
0.0000 [IU] | Freq: Three times a day (TID) | SUBCUTANEOUS | Status: DC
  Administered 2017-03-27 – 2017-03-28 (×3): 2 [IU] via SUBCUTANEOUS
  Administered 2017-03-28: 7 [IU] via SUBCUTANEOUS
  Administered 2017-03-29 (×2): 1 [IU] via SUBCUTANEOUS
  Administered 2017-03-29: 5 [IU] via SUBCUTANEOUS
  Administered 2017-03-30: 2 [IU] via SUBCUTANEOUS
  Administered 2017-03-31: 3 [IU] via SUBCUTANEOUS
  Administered 2017-03-31 – 2017-04-01 (×2): 2 [IU] via SUBCUTANEOUS
  Administered 2017-04-01: 5 [IU] via SUBCUTANEOUS
  Administered 2017-04-02 (×2): 1 [IU] via SUBCUTANEOUS
  Administered 2017-04-02: 2 [IU] via SUBCUTANEOUS
  Administered 2017-04-03: 5 [IU] via SUBCUTANEOUS
  Administered 2017-04-03: 2 [IU] via SUBCUTANEOUS
  Administered 2017-04-03: 1 [IU] via SUBCUTANEOUS
  Administered 2017-04-04: 3 [IU] via SUBCUTANEOUS
  Administered 2017-04-04: 1 [IU] via SUBCUTANEOUS
  Administered 2017-04-04: 2 [IU] via SUBCUTANEOUS

## 2017-03-27 MED ORDER — FERROUS SULFATE 325 (65 FE) MG PO TABS: 325.0000 mg | ORAL_TABLET | Freq: Every day | ORAL | Status: DC

## 2017-03-27 MED ORDER — BENAZEPRIL HCL 40 MG PO TABS
40.0000 mg | ORAL_TABLET | Freq: Every day | ORAL | Status: DC
  Administered 2017-03-28 – 2017-04-05 (×9): 40 mg via ORAL
  Filled 2017-03-27 (×9): qty 1

## 2017-03-27 MED ORDER — TRAZODONE HCL 50 MG PO TABS: 25.0000 mg | ORAL_TABLET | Freq: Every evening | ORAL | Status: DC | PRN

## 2017-03-27 MED ORDER — ATORVASTATIN CALCIUM 20 MG PO TABS
20.0000 mg | ORAL_TABLET | Freq: Every day | ORAL | Status: DC
  Administered 2017-03-27 – 2017-04-03 (×8): 20 mg via ORAL
  Filled 2017-03-27 (×9): qty 1

## 2017-03-27 MED ORDER — FERROUS SULFATE 325 (65 FE) MG PO TABS
325.0000 mg | ORAL_TABLET | Freq: Two times a day (BID) | ORAL | Status: DC
  Administered 2017-03-27 – 2017-04-04 (×17): 325 mg via ORAL
  Filled 2017-03-27 (×17): qty 1

## 2017-03-27 MED ORDER — APIXABAN 5 MG PO TABS
5.0000 mg | ORAL_TABLET | Freq: Two times a day (BID) | ORAL | Status: DC
  Administered 2017-03-28 – 2017-04-05 (×18): 5 mg via ORAL
  Filled 2017-03-27 (×18): qty 1

## 2017-03-27 MED ORDER — INSULIN GLARGINE 100 UNIT/ML ~~LOC~~ SOLN
5.0000 [IU] | Freq: Every day | SUBCUTANEOUS | Status: AC
  Administered 2017-03-28 – 2017-03-29 (×2): 5 [IU] via SUBCUTANEOUS
  Filled 2017-03-27 (×2): qty 0.05

## 2017-03-27 MED ORDER — POLYETHYLENE GLYCOL 3350 17 G PO PACK: 17.0000 g | PACK | Freq: Every day | ORAL | Status: DC | PRN

## 2017-03-27 MED ORDER — PROCHLORPERAZINE MALEATE 5 MG PO TABS: 5.0000 mg | ORAL_TABLET | Freq: Four times a day (QID) | ORAL | Status: DC | PRN

## 2017-03-27 MED ORDER — DILTIAZEM HCL ER COATED BEADS 120 MG PO CP24: 120.0000 mg | ORAL_CAPSULE | Freq: Every day | ORAL | Status: DC

## 2017-03-27 MED ORDER — DIPHENHYDRAMINE HCL 12.5 MG/5ML PO ELIX: 12.5000 mg | ORAL_SOLUTION | Freq: Four times a day (QID) | ORAL | Status: DC | PRN

## 2017-03-27 MED ORDER — PREMIER PROTEIN SHAKE
2.0000 [oz_av] | Freq: Two times a day (BID) | ORAL | Status: DC
  Administered 2017-03-28 – 2017-04-04 (×10): 2 [oz_av] via ORAL
  Filled 2017-03-27 (×30): qty 325.31

## 2017-03-27 MED ORDER — GUAIFENESIN-DM 100-10 MG/5ML PO SYRP: 5.0000 mL | ORAL_SOLUTION | Freq: Four times a day (QID) | ORAL | Status: DC | PRN

## 2017-03-27 MED ORDER — PROCHLORPERAZINE 25 MG RE SUPP
12.5000 mg | Freq: Four times a day (QID) | RECTAL | Status: DC | PRN
  Filled 2017-03-27: qty 1

## 2017-03-27 MED ORDER — GLIMEPIRIDE 2 MG PO TABS
1.0000 mg | ORAL_TABLET | Freq: Every day | ORAL | Status: DC
  Administered 2017-03-27 – 2017-03-29 (×3): 1 mg via ORAL
  Filled 2017-03-27 (×3): qty 1

## 2017-03-27 MED ORDER — ATORVASTATIN CALCIUM 40 MG PO TABS: 40.0000 mg | ORAL_TABLET | Freq: Every day | ORAL | Status: DC

## 2017-03-27 MED ORDER — BISACODYL 10 MG RE SUPP: 10.0000 mg | Freq: Every day | RECTAL | Status: DC | PRN

## 2017-03-27 MED ORDER — FLEET ENEMA 7-19 GM/118ML RE ENEM: 1.0000 | ENEMA | Freq: Once | RECTAL | Status: DC | PRN

## 2017-03-27 MED ORDER — INSULIN ASPART 100 UNIT/ML ~~LOC~~ SOLN
0.0000 [IU] | Freq: Every day | SUBCUTANEOUS | Status: DC
  Administered 2017-03-27: 2 [IU] via SUBCUTANEOUS

## 2017-03-27 MED ORDER — SIMVASTATIN 40 MG PO TABS: 40.0000 mg | ORAL_TABLET | Freq: Every day | ORAL | Status: DC

## 2017-03-27 MED ORDER — PROCHLORPERAZINE EDISYLATE 5 MG/ML IJ SOLN: 5.0000 mg | Freq: Four times a day (QID) | INTRAMUSCULAR | Status: DC | PRN

## 2017-03-27 MED ORDER — ACETAMINOPHEN 325 MG PO TABS: 325.0000 mg | ORAL_TABLET | ORAL | Status: DC | PRN

## 2017-03-27 MED ORDER — DILTIAZEM HCL ER COATED BEADS 120 MG PO CP24
120.0000 mg | ORAL_CAPSULE | Freq: Every day | ORAL | Status: DC
  Administered 2017-03-28 – 2017-04-05 (×9): 120 mg via ORAL
  Filled 2017-03-27 (×9): qty 1

## 2017-03-27 MED ORDER — APIXABAN 5 MG PO TABS: 5.0000 mg | ORAL_TABLET | Freq: Two times a day (BID) | ORAL | Status: DC

## 2017-03-27 NOTE — Progress Notes (Signed)
Discharged to [facility] by wheelchair, stable. Report given to RN. Belongings taken by the spouse.

## 2017-03-27 NOTE — PMR Pre-admission (Signed)
PMR Admission Coordinator Pre-Admission Assessment  Patient: Bryan Carney is an 76 y.o., male MRN: [id number] DOB: [birth year] Height: 5\' 6"  (167.6 cm) Weight: 64.9 kg (143 lb 1.3 oz)              Insurance Information HMO:     PPO: yes     PCP:      IPA:      80/20:      OTHER: medicare advantage plan PRIMARY: Blue medicare      Policy#: [id number]      Subscriber: pt CM Name: Cedric      Phone#: [phone]     Fax#: [phone] Pre-Cert#: TBD   Approved for 7 days when updates are due   Employer: self Benefits:  Phone #: [phone]     Name: 03/26/2017 Eff. Date: 06/25/2016     Deduct: none      Out of Pocket Max: [id number]      Life Max: none CIR: $300 co pay per day days 1-6 then insurance covers 100%      SNF: no co pay days 1-20; $167 per day days 21-60; no co pay days 61-100 Outpatient: $40 co pay per visit     Co-Pay: visits per medical neccesity Home Health: 100%      Co-Pay: visits per medical neccesity DME: 80%     Co-Pay: 20% Providers: in network  SECONDARY: none        Medicaid Application Date:       Case Manager:  Disability Application Date:       Case Worker:   Emergency Facilities manager Information    Name Relation Home Work Mobile   [place] Spouse [phone]     [place] Daughter [phone]  [phone]     Current Medical History  Patient Admitting Diagnosis: debility  History of Present Illness:  Bryan Carney is a 76 y.o. male with history of CAD/PVD, PE-on chronic coumadin, T2DM, prostate cancer,  who was admitted on 03/20/17 with syncope, significant enlargement of right chest hematoma and weakness. History taken from chart review, patient, and family.  Coumadin reversed with FFP and Vitamin K and he was transfused with 2 units PRBC for Hgb of 6.4.  CT chest without signs of extravasation and not need for angiogram per IVR. No surgical intervention needed per IR. [name] and he was started on IV Cardizem to help manage tachycardia.  Hospital course significant for A fib with RVR, diffuse weakness with tremors, difficulty following commands fluctuating bouts of lethargy and heavy posterior lean with transfer attempts. Head CT 10/1 reviewed, unremarkable for acute process.   Past Medical History  Past Medical History:  Diagnosis Date  . CAD (coronary artery disease)   . Diabetes ([place])   . Hx pulmonary embolism   . Hyperlipidemia   . Hypertension   . Prostate cancer ([place])   . PVOD (pulmonary veno-occlusive disease) ([place])   . Squamous cell carcinoma     Family History  family history includes Cancer in his sister and sister; Diabetes in his brother, brother, brother, and brother; Heart attack in his father.  Prior Rehab/Hospitalizations:  Has the patient had major surgery during 100 days prior to admission? No  Current Medications   Current Facility-Administered Medications:  .  amitriptyline (ELAVIL) tablet 10 mg, 10 mg, Oral, QHS, Rosita Fire, MD, 10 mg at 03/26/17 2220 .  apixaban (ELIQUIS) tablet 5 mg, 5 mg, Oral, BID, Hilty, Nadean Corwin, MD, 5 mg at 03/27/17 0918 .  atorvastatin (LIPITOR) tablet 20 mg, 20 mg, Oral, q1800, Skeet Simmer, RPH, 20 mg at 03/26/17 1746 .  benazepril (LOTENSIN) tablet 40 mg, 40 mg, Oral, Daily, Rosita Fire, MD, 40 mg at 03/27/17 0918 .  dextromethorphan-guaiFENesin (MUCINEX DM) 30-600 MG per 12 hr tablet 1 tablet, 1 tablet, Oral, BID, Rosita Fire, MD, 1 tablet at 03/27/17 [phone] .  diltiazem (CARDIZEM CD) 24 hr capsule 120 mg, 120 mg, Oral, Daily, Ledora Bottcher, [place], 120 mg at 03/27/17 0919 .  ferrous sulfate tablet 325 mg, 325 mg, Oral, Q breakfast, Danford, Suann Larry, MD, 325 mg at 03/27/17 0919 .  insulin aspart (novoLOG) injection 0-15 Units, 0-15 Units, Subcutaneous, TID WC, Orvan Falconer, MD, 8 Units at 03/27/17 1220 .  insulin aspart (novoLOG) injection 0-5 Units, 0-5 Units, Subcutaneous, QHS, Danford, Suann Larry, MD, 2 Units at 03/25/17  2225 .  insulin glargine (LANTUS) injection 6 Units, 6 Units, Subcutaneous, Daily, Rosita Fire, MD, 6 Units at 03/27/17 1000 .  ondansetron (ZOFRAN) tablet 4 mg, 4 mg, Oral, Q6H PRN **OR** ondansetron (ZOFRAN) injection 4 mg, 4 mg, Intravenous, Q6H PRN, Danford, Suann Larry, MD  Patients Current Diet: Diet heart healthy/carb modified Room service appropriate? 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 None of the above  Prior Functional Level Prior Function Level of Independence: Independent Comments: owns car dealership, works full time, driver  Self Care: Did the patient need help bathing, dressing, using the toilet or eating?  Independent  Indoor Mobility: Did the patient need assistance with walking from room to room (with or without device)? Independent  Stairs: Did the patient need assistance with internal or external stairs (with or without device)? Independent  Functional Cognition: Did the patient need help planning regular tasks such as shopping or remembering to take medications? Independent  Current Functional Level Cognition  Overall Cognitive Status: Impaired/Different from baseline Orientation Level: Oriented X4 Following Commands: Follows one step commands with increased time, Follows one step commands inconsistently Safety/Judgement: Decreased awareness of deficits, Decreased awareness of safety General Comments: Pt at times  slow to respond but waxes and wanes cognitively. At times very appropriate and able to maintain conversation, problem solve, and follow commands. However, will demonstrate decreased responsivity at times and simultaneously with poor ability to sequence and plan movement. Of note, during first episode SpO2 desaturation to 85% but when OT/PT engaged pt in conversation, SpO2 rebounded to >92% immediately.     Extremity Assessment (includes Sensation/Coordination)  Upper Extremity Assessment: RUE deficits/detail RUE Deficits / Details: Pain with movement due to dematoma. Pt guarding initially but with relaxation techniques able to complete AAROM R elbow and shoulder (0-45 degrees only).  Lower Extremity Assessment: Generalized weakness    ADLs  Overall ADL's : Needs assistance/impaired Eating/Feeding: Minimal assistance, Sitting Grooming: Minimal assistance, Sitting Upper Body Bathing: Moderate assistance, Sitting Lower Body Bathing: Maximal assistance, Sit to/from stand Upper Body Dressing : Moderate assistance, Sitting Lower Body Dressing: Maximal assistance, Sit to/from stand Toilet Transfer: Minimal assistance, +2  for physical assistance, Moderate assistance, Maximal assistance Toilet Transfer Details (indicate cue type and reason): Min assist +2 for safety on initial stand. Pt becoming fatigued and requiring up to max assist to sit in chair. Poor motor planning noted with descent.  Toileting- Clothing Manipulation and Hygiene: Moderate assistance, Sit to/from stand Functional mobility during ADLs: Minimal assistance, Moderate assistance, Maximal assistance, +2 for physical assistance General ADL Comments: Educated pt and wife on incorporation of R UE into functional tasks to improve recovery.     Mobility  Overal bed mobility: Needs Assistance Bed Mobility: Rolling, Sidelying to Sit Rolling: Max assist, +2 for physical assistance Sidelying to sit: Max assist, +2 for physical  assistance Supine to sit: Max assist Sit to supine: Mod assist, +2 for physical assistance General bed mobility comments: Max assist for trunk and B LE management.     Transfers  Overall transfer level: Needs assistance Equipment used: Rolling walker (2 wheeled), 2 person hand held assist Transfers: Sit to/from Stand Sit to Stand: +2 physical assistance, Min assist, Mod assist, Max assist Stand pivot transfers: Mod assist, +2 physical assistance General transfer comment: Pt able to stand with minA initially utilizing RW and able to amb to window. Pt requiring seated rest break at that time due to B LE weakness requiring up to max assist to lower to sitting. After seated rest break, able to recover and complete second sit<>stand.     Ambulation / Gait / Stairs / Wheelchair Mobility  Ambulation/Gait Ambulation/Gait assistance: Mod assist, +2 physical assistance Ambulation Distance (Feet): 5 Feet Assistive device: Rolling walker (2 wheeled) Gait Pattern/deviations: Shuffle, Trunk flexed, Narrow base of support General Gait Details: Amb in room with RW and modA+2, requiring seated rest break secondary to BLE weakness and bilat knee buckling, requiring maxA to lower into sitting. Able to recover and complete second sit<>stand with RW and minA+2 Gait velocity: Decreased Gait velocity interpretation: <1.8 ft/sec, indicative of risk for recurrent falls    Posture / Balance Dynamic Sitting Balance Sitting balance - Comments: Sat EOB >64min without UE support and min guard Balance Overall balance assessment: Needs assistance Sitting-balance support: Feet supported, No upper extremity supported Sitting balance-Leahy Scale: Fair Sitting balance - Comments: Sat EOB >18min without UE support and min guard Standing balance support: Bilateral upper extremity supported, No upper extremity supported Standing balance-Leahy Scale: Poor Standing balance comment: Relies on B UE suppor.t     Special  needs/care consideration BiPAP/CPAP  N/a CPM  N/a Continuous Drip IV  N/a Dialysis  N/a Life Vest  N/a Oxygen  N/a Special Bed  N/a Trach Size  N/a Wound Vac  N/a Skin ecchymosis to right chest and side 03/20/17 3 am  03/20/17 3 AM   Bowel mgmt: [facility] 03/24/2017 Bladder mgmt: [facility] Diabetic mgmt  yes   Previous Home Environment Living Arrangements: Spouse/significant other  Lives With: Spouse Available Help at Discharge: Family, Available 24 hours/day Type of Home: House Home Layout: One level Home Access: Level entry Bathroom Shower/Tub: Multimedia programmer: Handicapped height Bathroom Accessibility: Yes Home Care Services: No  Discharge Living Setting Plans for Discharge Living Setting: Patient's home, Lives with (comment) (spouse) Type of Home at Discharge: House Discharge Home Layout: One level Discharge Home Access: Level entry Discharge Bathroom Shower/Tub: Walk-in shower Discharge Bathroom Toilet: Handicapped height Discharge Bathroom Accessibility: Yes How Accessible: Accessible via walker Does the patient have any problems obtaining your medications?: No  Social/Family/Support Systems Patient Roles: Spouse, Parent, Other (Comment) (buisiness owner)  Contact Information: Izora Gala, wife Anticipated Caregiver: wife Anticipated Caregiver's Contact Information: see above Ability/Limitations of Caregiver: no limits Caregiver Availability: 24/7 Discharge Plan Discussed with Primary Caregiver: Yes Is Caregiver In Agreement with Plan?: Yes Does Caregiver/Family have Issues with Lodging/Transportation while Pt is in Rehab?: No   Wife is very anxious.  Goals/Additional Needs Patient/Family Goal for Rehab: supervision PT, OT, and SLP Expected length of stay: [facility] 17-20 days Pt/Family Agrees to Admission and willing to participate: Yes Program Orientation Provided & Reviewed with Pt/Caregiver Including Roles  & Responsibilities: Yes  Decrease  burden of Care through IP rehab admission: n/a  Possible need for SNF placement upon discharge:not anticipated  Patient Condition: This patient's medical and functional status has changed since the consult dated: 03/25/2017 in which the Rehabilitation Physician determined and documented that the patient's condition is appropriate for intensive rehabilitative care in an inpatient rehabilitation facility. See "History of Present Illness" (above) for medical update. Functional changes are: mod assist. Patient's medical and functional status update has been discussed with the Rehabilitation physician and patient remains appropriate for inpatient rehabilitation. Will admit to inpatient rehab today.  Preadmission Screen Completed By:  Cleatrice Burke, 03/27/2017 1:32 PM ______________________________________________________________________   Discussed status with Dr. Posey Pronto on 03/27/2017 at  1333 and received telephone approval for admission today.  Admission Coordinator:  Cleatrice Burke, time 1561 Date 03/27/2017

## 2017-03-27 NOTE — Progress Notes (Signed)
Bryan Gong, RN Rehab Admission Coordinator Signed Physical Medicine and Rehabilitation  PMR Pre-admission Date of Service: 03/27/2017 11:44 AM  Related encounter: ED to Hosp-Admission (Discharged) from 03/20/2017 in [place]       [] Hide copied text PMR Admission Coordinator Pre-Admission Assessment  Patient: Bryan Carney is an 76 y.o., male MRN: [id number] DOB: [birth year] Height: 5\' 6"  (167.6 cm) Weight: 64.9 kg (143 lb 1.3 oz)                                                                                                                                                  Insurance Information HMO:     PPO: yes     PCP:      IPA:      80/20:      OTHER: medicare advantage plan PRIMARY: Blue medicare      Policy#: [id number]      Subscriber: pt CM Name: Bryan Carney      Phone#: [phone]     Fax#: [phone] Pre-Cert#: [id number]   Approved for 7 days when updates are due   Employer: self Benefits:  Phone #: [phone]     Name: 03/26/2017 Eff. Date: 06/25/2016     Deduct: none      Out of Pocket Max: [phone]      Life Max: none CIR: $300 co pay per day days 1-6 then insurance covers 100%      SNF: no co pay days 1-20; $167 per day days 21-60; no co pay days 61-100 Outpatient: $40 co pay per visit     Co-Pay: visits per medical neccesity Home Health: 100%      Co-Pay: visits per medical neccesity DME: 80%     Co-Pay: 20% Providers: in network  SECONDARY: none        Medicaid Application Date:       Case Manager:  Disability Application Date:       Case Worker:   Emergency Tax adviser Information    Name Relation Home Work Mobile   [place] Spouse [phone]     [place] Daughter [phone]  [phone]     Current Medical History  Patient Admitting Diagnosis: debility  History of Present Illness:  Bryan Carney a 76 y.o.malewith history of CAD/PVD, PE-on chronic coumadin, T2DM, prostate cancer, who  was admitted on 03/20/17 with syncope, significant enlargement of right chest hematoma and weakness. History taken from chart review, patient, and family. Coumadin reversed with FFP and Vitamin K and he was transfused with 2 units PRBC for Hgb of 6.4. CT chest without signs of extravasation and not need for angiogram per IVR. No surgical intervention needed per IR. [name] and he was started on IV Cardizem to help manage tachycardia. Hospital course significant for A fib with RVR,  diffuse weakness with tremors, difficulty following commands fluctuating bouts of lethargy and heavy posterior lean with transfer attempts. Head CT 10/1 reviewed, unremarkable for acute process.   Past Medical History      Past Medical History:  Diagnosis Date  . CAD (coronary artery disease)   . Diabetes ([place])   . Hx pulmonary embolism   . Hyperlipidemia   . Hypertension   . Prostate cancer ([place])   . PVOD (pulmonary veno-occlusive disease) ([place])   . Squamous cell carcinoma     Family History  family history includes Cancer in his sister and sister; Diabetes in his brother, brother, brother, and brother; Heart attack in his father.  Prior Rehab/Hospitalizations:  Has the patient had major surgery during 100 days prior to admission? No  Current Medications   Current Facility-Administered Medications:  .  amitriptyline (ELAVIL) tablet 10 mg, 10 mg, Oral, QHS, Rosita Fire, MD, 10 mg at 03/26/17 2220 .  apixaban (ELIQUIS) tablet 5 mg, 5 mg, Oral, BID, Hilty, Nadean Corwin, MD, 5 mg at 03/27/17 0918 .  atorvastatin (LIPITOR) tablet 20 mg, 20 mg, Oral, q1800, Skeet Simmer, RPH, 20 mg at 03/26/17 1746 .  benazepril (LOTENSIN) tablet 40 mg, 40 mg, Oral, Daily, Rosita Fire, MD, 40 mg at 03/27/17 0918 .  dextromethorphan-guaiFENesin (MUCINEX DM) 30-600 MG per 12 hr tablet 1 tablet, 1 tablet, Oral, BID, Rosita Fire, MD, 1 tablet at 03/27/17 [phone] .  diltiazem (CARDIZEM CD) 24 hr  capsule 120 mg, 120 mg, Oral, Daily, Ledora Bottcher, [place], 120 mg at 03/27/17 0919 .  ferrous sulfate tablet 325 mg, 325 mg, Oral, Q breakfast, Danford, Suann Larry, MD, 325 mg at 03/27/17 0919 .  insulin aspart (novoLOG) injection 0-15 Units, 0-15 Units, Subcutaneous, TID WC, Orvan Falconer, MD, 8 Units at 03/27/17 1220 .  insulin aspart (novoLOG) injection 0-5 Units, 0-5 Units, Subcutaneous, QHS, Danford, Suann Larry, MD, 2 Units at 03/25/17 2225 .  insulin glargine (LANTUS) injection 6 Units, 6 Units, Subcutaneous, Daily, Rosita Fire, MD, 6 Units at 03/27/17 1000 .  ondansetron (ZOFRAN) tablet 4 mg, 4 mg, Oral, Q6H PRN **OR** ondansetron (ZOFRAN) injection 4 mg, 4 mg, Intravenous, Q6H PRN, Danford, Suann Larry, MD  Patients Current Diet: Diet heart healthy/carb modified Room service appropriate? 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 None of the above  Prior Functional Level Prior Function Level of Independence: Independent Comments: owns car dealership, works full time, driver  Self Care: Did the patient need help bathing, dressing, using the toilet or eating?  Independent  Indoor Mobility: Did the patient need assistance with walking from room to room (with or without device)? Independent  Stairs: Did the patient need assistance with internal or external stairs (with or without device)? Independent  Functional Cognition: Did  the patient need help planning regular tasks such as shopping or remembering to take medications? Independent  Current Functional Level Cognition  Overall Cognitive Status: Impaired/Different from baseline Orientation Level: Oriented X4 Following Commands: Follows one  step commands with increased time, Follows one step commands inconsistently Safety/Judgement: Decreased awareness of deficits, Decreased awareness of safety General Comments: Pt at times slow to respond but waxes and wanes cognitively. At times very appropriate and able to maintain conversation, problem solve, and follow commands. However, will demonstrate decreased responsivity at times and simultaneously with poor ability to sequence and plan movement. Of note, during first episode SpO2 desaturation to 85% but when OT/PT engaged pt in conversation, SpO2 rebounded to >92% immediately.     Extremity Assessment (includes Sensation/Coordination)  Upper Extremity Assessment: RUE deficits/detail RUE Deficits / Details: Pain with movement due to dematoma. Pt guarding initially but with relaxation techniques able to complete AAROM R elbow and shoulder (0-45 degrees only).  Lower Extremity Assessment: Generalized weakness    ADLs  Overall ADL's : Needs assistance/impaired Eating/Feeding: Minimal assistance, Sitting Grooming: Minimal assistance, Sitting Upper Body Bathing: Moderate assistance, Sitting Lower Body Bathing: Maximal assistance, Sit to/from stand Upper Body Dressing : Moderate assistance, Sitting Lower Body Dressing: Maximal assistance, Sit to/from stand Toilet Transfer: Minimal assistance, +2 for physical assistance, Moderate assistance, Maximal assistance Toilet Transfer Details (indicate cue type and reason): Min assist +2 for safety on initial stand. Pt becoming fatigued and requiring up to max assist to sit in chair. Poor motor planning noted with descent.  Toileting- Clothing Manipulation and Hygiene: Moderate  assistance, Sit to/from stand Functional mobility during ADLs: Minimal assistance, Moderate assistance, Maximal assistance, +2 for physical assistance General ADL Comments: Educated pt and wife on incorporation of R UE into functional tasks to improve recovery.     Mobility  Overal bed mobility: Needs Assistance Bed Mobility: Rolling, Sidelying to Sit Rolling: Max assist, +2 for physical assistance Sidelying to sit: Max assist, +2 for physical assistance Supine to sit: Max assist Sit to supine: Mod assist, +2 for physical assistance General bed mobility comments: Max assist for trunk and B LE management.     Transfers  Overall transfer level: Needs assistance Equipment used: Rolling walker (2 wheeled), 2 person hand held assist Transfers: Sit to/from Stand Sit to Stand: +2 physical assistance, Min assist, Mod assist, Max assist Stand pivot transfers: Mod assist, +2 physical assistance General transfer comment: Pt able to stand with minA initially utilizing RW and able to amb to window. Pt requiring seated rest break at that time due to B LE weakness requiring up to max assist to lower to sitting. After seated rest break, able to recover and complete second sit<>stand.     Ambulation / Gait / Stairs / Wheelchair Mobility  Ambulation/Gait Ambulation/Gait assistance: Mod assist, +2 physical assistance Ambulation Distance (Feet): 5 Feet Assistive device: Rolling walker (2 wheeled) Gait Pattern/deviations: Shuffle, Trunk flexed, Narrow base of support General Gait Details: Amb in room with RW and modA+2, requiring seated rest break secondary to BLE weakness and bilat knee buckling, requiring maxA to lower into sitting. Able to recover and complete second sit<>stand with RW and minA+2 Gait velocity: Decreased Gait velocity interpretation: <1.8 ft/sec, indicative of risk for recurrent falls    Posture / Balance Dynamic Sitting Balance Sitting balance - Comments: Sat EOB >60min without  UE support and min guard Balance Overall balance assessment: Needs assistance Sitting-balance support: Feet supported, No upper extremity supported Sitting balance-Leahy Scale: Fair Sitting balance - Comments: Sat EOB >22min without UE support and min guard Standing balance support: Bilateral upper extremity supported, No upper extremity supported Standing balance-Leahy Scale: Poor Standing balance comment: Relies on B UE suppor.t  Special needs/care consideration BiPAP/CPAP  N/a CPM  N/a Continuous Drip IV  N/a Dialysis  N/a Life Vest  N/a Oxygen  N/a Special Bed  N/a Trach Size  N/a Wound Vac  N/a Skin ecchymosis to right chest and side 03/20/17 3 am  03/20/17 3 AM   Bowel mgmt: [facility] 03/24/2017 Bladder mgmt: [facility] Diabetic mgmt  yes   Previous Home Environment Living Arrangements: Spouse/significant other  Lives With: Spouse Available Help at Discharge: Family, Available 24 hours/day Type of Home: House Home Layout: One level Home Access: Level entry Bathroom Shower/Tub: Multimedia programmer: Handicapped height Bathroom Accessibility: Yes Home Care Services: No  Discharge Living Setting Plans for Discharge Living Setting: Patient's home, Lives with (comment) (spouse) Type of Home at Discharge: House Discharge Home Layout: One level Discharge Home Access: Level entry Discharge Bathroom Shower/Tub: Walk-in shower Discharge Bathroom Toilet: Handicapped height Discharge Bathroom Accessibility: Yes How Accessible: Accessible via walker Does the patient have any problems obtaining your medications?: No  Social/Family/Support Systems Patient Roles: Spouse, Parent, Other (Comment) (buisiness owner) Contact Information: Izora Gala, wife Anticipated Caregiver: wife Anticipated Ambulance person Information: see above Ability/Limitations of Caregiver: no limits Caregiver Availability: 24/7 Discharge Plan Discussed with Primary Caregiver: Yes Is  Caregiver In Agreement with Plan?: Yes Does Caregiver/Family have Issues with Lodging/Transportation while Pt is in Rehab?: No   Wife is very anxious.  Goals/Additional Needs Patient/Family Goal for Rehab: supervision PT, OT, and SLP Expected length of stay: [facility] 17-20 days Pt/Family Agrees to Admission and willing to participate: Yes Program Orientation Provided & Reviewed with Pt/Caregiver Including Roles  & Responsibilities: Yes  Decrease burden of Care through IP rehab admission: n/a  Possible need for SNF placement upon discharge:not anticipated  Patient Condition: This patient's medical and functional status has changed since the consult dated: 03/25/2017 in which the Rehabilitation Physician determined and documented that the patient's condition is appropriate for intensive rehabilitative care in an inpatient rehabilitation facility. See "History of Present Illness" (above) for medical update. Functional changes are: mod assist. Patient's medical and functional status update has been discussed with the Rehabilitation physician and patient remains appropriate for inpatient rehabilitation. Will admit to inpatient rehab today.  Preadmission Screen Completed By:  Cleatrice Burke, 03/27/2017 1:32 PM ______________________________________________________________________   Discussed status with Dr. Posey Pronto on 03/27/2017 at  1333 and received telephone approval for admission today.  Admission Coordinator:  Cleatrice Burke, time 1194 Date 03/27/2017       Cosigned by: [name], MD at 03/27/2017 1:59 PM  Revision History

## 2017-03-27 NOTE — Discharge Summary (Addendum)
Physician Discharge Summary  Bryan Carney:[id number] DOB: 03/15/1941 DOA: 03/20/2017  PCP: [name], NP  Admit date: 03/20/2017 Discharge date: 03/27/2017  Admitted From: home Disposition:  CIR   Recommendations for Outpatient Follow-up:  1.    Repeat Hb in 2-3 days at CIR  2.     F/u on Cough and delirium- will stop Dextromethorphan- Guaifenesin today as it can cause confusion-  currently no fever or leukocytosis 3.    Outpt f/u of renal disease 4.    Follow-up with Dr. [name] for cardiac issues in 2-3 wks 5.    Consider resuming aspirin for CAD in few days if he remains stable on Eliquis      Discharge Condition:  stable   CODE STATUS:  Full code   Diet recommendations: heart healthy, diabetic Consultations:  gen surgery  IR  Cardiology  Inpatient rehab  PCCM   Discharge Diagnoses:  Principal Problem:   Acute blood loss anemia Active Problems: Chest wall hematoma   Essential hypertension   New Paroxysmal atrial fibrillation (HCC)   Acute delirium   Coronary artery disease due to lipid rich plaque   Type 2 diabetes mellitus without complication, without long-term current use of insulin (HCC)   Hyponatremia   Supratherapeutic INR   Coronary artery disease involving native coronary artery of native heart without angina pectoris   Prostate cancer    Subjective: No complaints.   Brief Summary: 76 year old male with history of coronary artery disease, peripheral vascular disease, diabetes, hypertension, history of PE in 1990s on Coumadin presented with right chest wall swelling, bruises and was found to have a hematoma. In the ER patient with hemoglobin of 6.4 with INR 4.8. Found to have new diagnosis of A-fib in hospital which is asymptomatic.   Hospital Course:  Spontaneous right chest wall hematoma in the setting of supra therapeutic INR from Coumadin - 1 U FFP and vitamin K given in ER  --Patient was evaluated by general surgery and IR- recommendations  are chest binder and conservative management.     Acute blood loss anemia: -  received total 3 units of red blood cell- last one on 9/29 - Hb subsequently has been stable  History of PE on Coumadin - No known history of DVT. PE was in 1999 in the setting of CABG     New onset atrial fibrillation with RVR, minimally elevated troponin CHA2DS2-VASc Score 5 -Heart rate controlled on oral diltiazem - Echocardiogram unremarkable- see below.  - Cardiology evaluated the patient and placed him on Eliquis - as mentioned above, Hb has been stable- will need to repeat in 2-3 days  - Follow-up with Dr. [name] after discharge  Acute delirium - fluctuations in mental status in hospital - even today wife states that he is sleepy and not himself -TSH level acceptable - Ammonia not elevated - CT head- no acute finding - no h/o dementia per wife- usually quite sharp  - hold Dextromethorphan- Guaifenesin today as it can cause confusion/ sleepiness  - confirmed with wife that he takes Amitriptyline at home and this is being given in the hospital  Cough - wife states he has developed a congested cough in the hospital- please follow  Swelling of right upper extremity due to superficial thrombosis. Continue supportive care, elevation and warm compresses. The pain and swelling is gradually improving.   Diabetes type 2  - takes Kombiglyze at home   Coronary artery disease - Continue statin - May be able to  resume aspirin in few days if he remains stable on Eliquis when hematoma improves.      Hypertension - . Continue Lotensin and diltiazem. Dc norvasc  Acute kidney injury vs CKD 3 - Cr 1.59 on admission- improved slightly to 1.3  --CT scan of chest showed right renal atrophy with likely chronic occlusion of right renal artery. Recommend outpatient follow-up. -Monitor BMP     Discharge Instructions  Discharge Instructions    Increase activity slowly    Complete by:  As directed       Allergies as of 03/27/2017      Reactions   Lopressor [metoprolol Tartrate] Other (See Comments)   Severe BP drop      Medication List    STOP taking these medications   amLODipine 5 MG tablet Commonly known as:  NORVASC   aspirin 325 MG tablet   HYDROcodone-acetaminophen 5-325 MG tablet Commonly known as:  NORCO/VICODIN   warfarin 5 MG tablet Commonly known as:  COUMADIN     TAKE these medications   amitriptyline 10 MG tablet Commonly known as:  ELAVIL Take 10 mg by mouth at bedtime.   apixaban 5 MG Tabs tablet Commonly known as:  ELIQUIS Take 1 tablet (5 mg total) by mouth 2 (two) times daily.   benazepril 40 MG tablet Commonly known as:  LOTENSIN Take 40 mg by mouth daily.   diltiazem 120 MG 24 hr capsule Commonly known as:  CARDIZEM CD Take 1 capsule (120 mg total) by mouth daily.   ferrous sulfate 325 (65 FE) MG tablet Take 325 mg by mouth daily with breakfast.   KOMBIGLYZE XR 10-998 MG Tb24 Generic drug:  Saxagliptin-Metformin Take 1 tablet by mouth daily.   simvastatin 40 MG tablet Commonly known as:  ZOCOR Take 40 mg by mouth daily.       Allergies  Allergen Reactions  . Lopressor [Metoprolol Tartrate] Other (See Comments)    Severe BP drop     Procedures/Studies: 2 D ECHO Left ventricle: The cavity size was normal. Wall thickness was   increased in a pattern of moderate LVH. Systolic function was   normal. The estimated ejection fraction was in the range of 50%   to 55%. Wall motion was normal; there were no regional wall   motion abnormalities. - Aortic valve: Trileaflet; mildly thickened, mildly calcified   leaflets. Valve mobility was moderately restricted. Transvalvular   velocity was increased. There was mild to moderate stenosis.   Valve area (VTI): 1.52 cm^2. Valve area (Vmax): 1.56 cm^2. Valve   area (Vmean): 1.4 cm^2. - Aortic root: The aortic root was mildly dilated. - Mitral valve: Mildly to moderately calcified annulus.  There was   mild to moderate regurgitation. - Left atrium: The atrium was moderately dilated.  Dg Chest 2 View  Result Date: 03/18/2017 CLINICAL DATA:  Bruising and swelling right upper and lateral chest. EXAM: CHEST  2 VIEW COMPARISON:  10/20/2015 FINDINGS: AP and lateral sitting views were obtained. The lungs are clear without focal pneumonia, edema, pneumothorax or pleural effusion. The cardiopericardial silhouette is within normal limits for size. Patient is status post CABG The visualized bony structures of the thorax are intact. IMPRESSION: No active cardiopulmonary disease. Electronically Signed   By: Misty Stanley M.D.   On: 03/18/2017 20:23   Ct Head Wo Contrast  Result Date: 03/25/2017 CLINICAL DATA:  76 year old male with unexplained altered mental status. EXAM: CT HEAD WITHOUT CONTRAST TECHNIQUE: Contiguous axial images were obtained from the base  of the skull through the vertex without intravenous contrast. COMPARISON:  03/23/2017 and earlier. FINDINGS: Brain: Cerebral volume is within normal limits for age. No midline shift, ventriculomegaly, mass effect, evidence of mass lesion, intracranial hemorrhage or evidence of cortically based acute infarction. Mild for age scattered white matter hypodensity, including in the anterior limb of the left internal capsule. No cortical encephalomalacia identified. Stable gray-white matter differentiation. Vascular: Fairly extensive Calcified atherosclerosis at the skull base. No suspicious intracranial vascular hyperdensity. Skull: No acute osseous abnormality identified. Sinuses/Orbits: Chronic left maxillary sinus mucoperiosteal thickening. Other visible paranasal sinuses and mastoids are stable and well pneumatized. Other: Calcified atherosclerosis of the scalp vasculature. No acute orbit or scalp soft tissue findings. IMPRESSION: No acute intracranial abnormality and stable non contrast CT appearance of the brain since 03/23/2017. Electronically Signed    By: Genevie Ann M.D.   On: 03/25/2017 12:43   Ct Head Wo Contrast  Result Date: 03/23/2017 CLINICAL DATA:  Altered level of consciousness EXAM: CT HEAD WITHOUT CONTRAST TECHNIQUE: Contiguous axial images were obtained from the base of the skull through the vertex without intravenous contrast. COMPARISON:  None. FINDINGS: Brain: Chronic mild to moderate small vessel ischemic disease. No acute intracranial hemorrhage, midline shift or edema. No intra-axial mass nor extra-axial fluid collections. No hydrocephalus. Mild superficial and central atrophy. Vascular: Moderate atherosclerosis of the carotid siphons. No hyperdense vessels. Skull: No acute skull fracture. Sinuses/Orbits: There is mild circumferential mucosal thickening of the left maxillary sinus with thickened maxillary sinus walls consistent with chronic sinusitis. Mild ethmoid sinus mucosal thickening. Clear sphenoid and frontal sinuses. Intact orbits and globes. Other: None IMPRESSION: 1. Chronic small vessel ischemic disease of periventricular white matter with atrophy. 2. No acute intracranial abnormality. 3. Chronic left maxillary sinusitis. Electronically Signed   By: Ashley Royalty M.D.   On: 03/23/2017 23:47   Ct Chest W Contrast  Result Date: 03/19/2017 CLINICAL DATA:  Chest wall pain and bruising, progressive. On anticoagulation. EXAM: CT CHEST WITH CONTRAST TECHNIQUE: Multidetector CT imaging of the chest was performed during intravenous contrast administration. CONTRAST:  58mL ISOVUE-300 IOPAMIDOL (ISOVUE-300) INJECTION 61% COMPARISON:  Radiograph yesterday. FINDINGS: Cardiovascular: Post CABG with atherosclerosis of native coronary arteries. No aortic dissection. Ascending thoracic aorta measures 3.4 cm with small atherosclerosis. Cannot assess for pulmonary embolus given phase of visualized thyroid gland is normal. No pericardial effusion. Contrast. Borderline heart size. Questionable right subclavian/axillary venous stenosis or occlusion  with collaterals in the right axilla. Mediastinum/Nodes: No adenopathy. The esophagus is decompressed. The visualized thyroid gland is normal. Lungs/Pleura: Mild breathing motion artifact at the lung bases. Mild dependent atelectasis. No consolidation, pulmonary edema or pleural fluid. Upper Abdomen: Diminished enhancement of the right kidney with occlusion of the right renal artery at its origin, some distal arterial reconstitution. Right renal atrophy seen on prior abdominal CT. Musculoskeletal: Heterogeneous enlargement of the right pectoralis muscle and poorly defined subpectoral soft tissues consistent with hematoma. No active extravasation. Soft tissue stranding extends inferiorly on the chest wall. No rib fracture or evidence of acute osseous abnormality. Degenerative change in the thoracic spine with vacuum phenomenon and endplate spurring. IMPRESSION: 1. Large right pectoralis/sub pectoralis chest wall hematoma. No active extravasation. 2. Dependent atelectasis in the lung bases, otherwise no acute intrathoracic abnormality. 3. Aortic atherosclerosis. Post CABG with native coronary artery calcifications. 4. Incidentally noted in the upper abdomen right renal atrophy and diminished enhancement. There is occlusion of the right renal artery at the origin, with some distal reconstitution of arterial flow.  This is likely chronic as right renal atrophy was seen on prior noncontrast abdominal CT. 5. Probable right subclavian venous stenosis or occlusion with chest wall collaterals, suggesting this is chronic. Aortic Atherosclerosis (ICD10-I70.0). Electronically Signed   By: Jeb Levering M.D.   On: 03/19/2017 04:58   Dg Chest Port 1 View  Result Date: 03/23/2017 CLINICAL DATA:  Hypoxia this evening EXAM: PORTABLE CHEST 1 VIEW COMPARISON:  03/19/2017 chest CT FINDINGS: Soft tissue prominence over the anterior and lateral right chest wall consistent with known right-sided pectoralis hematoma. Slightly low  lung volumes with crowding of interstitial lung markings. Faint bibasilar opacities are seen consistent with atelectasis. Slightly more confluent opacities seen at the right lung base however and a superimposed pneumonia is not excluded. The patient is status post CABG. Heart is normal in size with aortic atherosclerosis. No overt pulmonary edema. No acute osseous appearing abnormality. IMPRESSION: 1. Low lung volumes with crowding of interstitial lung markings. Hazy opacity at the right lung base cannot exclude a subtle pneumonia and atelectasis. 2. No overt pulmonary edema. 3. Soft tissue swelling about the right chest wall consistent with known pectoralis hematoma. Electronically Signed   By: Ashley Royalty M.D.   On: 03/23/2017 02:54       Discharge Exam: Vitals:   03/27/17 0746 03/27/17 1200  BP: (!) 167/63 (!) 154/105  Pulse: 74 (!) 42  Resp: 14 17  Temp: 98 F (36.7 C)   SpO2: 96% 96%   Vitals:   03/26/17 1939 03/27/17 0400 03/27/17 0746 03/27/17 1200  BP: (!) 145/60 (!) 166/70 (!) 167/63 (!) 154/105  Pulse: 71 74 74 (!) 42  Resp: 19  14 17   Temp: 99.2 F (37.3 C) 98.3 F (36.8 C) 98 F (36.7 C)   TempSrc: Oral Oral Oral   SpO2: 94% 94% 96% 96%  Weight:      Height:        General: Pt is alert, awake, not in acute distress Cardiovascular: RRR, S1/S2 +, no rubs, no gallops Respiratory: CTA bilaterally, no wheezing, no rhonchi Chest:  hematoma noted on right chest chest wall Abdominal: Soft, NT, ND, bowel sounds + Extremities: no edema, no cyanosis    The results of significant diagnostics from this hospitalization (including imaging, microbiology, ancillary and laboratory) are listed below for reference.     Microbiology: Recent Results (from the past 240 hour(s))  MRSA PCR Screening     Status: None   Collection Time: 03/20/17  5:08 PM  Result Value Ref Range Status   MRSA by PCR NEGATIVE NEGATIVE Final    Comment:        The GeneXpert MRSA Assay (FDA approved  for NASAL specimens only), is one component of a comprehensive MRSA colonization surveillance program. It is not intended to diagnose MRSA infection nor to guide or monitor treatment for MRSA infections.      Labs: BNP (last 3 results)  Recent Labs  03/25/17 1013  BNP 981.1*   Basic Metabolic Panel:  Recent Labs Lab 03/21/17 0310 03/22/17 0106 03/25/17 1314 03/26/17 0148  NA 134* 134* 132* 131*  K 3.9 3.8 3.7 3.5  CL 104 103 102 101  CO2 24 25 24 24   GLUCOSE 200* 178* 244* 159*  BUN 14 21* 29* 25*  CREATININE 1.26* 1.35* 1.33* 1.32*  CALCIUM 7.8* 7.8* 7.8* 7.7*  MG  --  1.9  --   --    Liver Function Tests: No results for input(s): AST, ALT, ALKPHOS, BILITOT, PROT,  ALBUMIN in the last 168 hours. No results for input(s): LIPASE, AMYLASE in the last 168 hours.  Recent Labs Lab 03/22/17 1614  AMMONIA 17   CBC:  Recent Labs Lab 03/23/17 0255 03/23/17 1836 03/24/17 0249 03/25/17 0250 03/26/17 0148 03/27/17 0248  WBC 8.4  --  7.2 9.2 9.7 10.0  HGB 7.2* 8.3* 8.2* 8.1* 7.9* 8.7*  HCT 21.3* 25.0* 24.5* 24.5* 24.2* 25.5*  MCV 91.4  --  91.4 92.5 92.0 92.7  PLT 174  --  192 230 275 304   Cardiac Enzymes:  Recent Labs Lab 03/22/17 0106 03/22/17 0543 03/22/17 1214  TROPONINI 0.04* 0.04* 0.04*   BNP: Invalid input(s): POCBNP CBG:  Recent Labs Lab 03/26/17 1221 03/26/17 1552 03/26/17 2113 03/27/17 0750 03/27/17 1209  GLUCAP 244* 175* 184* 201* 254*   D-Dimer No results for input(s): DDIMER in the last 72 hours. Hgb A1c No results for input(s): HGBA1C in the last 72 hours. Lipid Profile No results for input(s): CHOL, HDL, LDLCALC, TRIG, CHOLHDL, LDLDIRECT in the last 72 hours. Thyroid function studies No results for input(s): TSH, T4TOTAL, T3FREE, THYROIDAB in the last 72 hours.  Invalid input(s): FREET3 Anemia work up No results for input(s): VITAMINB12, FOLATE, FERRITIN, TIBC, IRON, RETICCTPCT in the last 72 hours. Urinalysis     Component Value Date/Time   COLORURINE YELLOW 03/23/2017 2130   APPEARANCEUR HAZY (A) 03/23/2017 2130   LABSPEC 1.014 03/23/2017 2130   PHURINE 5.0 03/23/2017 2130   GLUCOSEU NEGATIVE 03/23/2017 2130   HGBUR SMALL (A) 03/23/2017 2130   BILIRUBINUR NEGATIVE 03/23/2017 2130   KETONESUR NEGATIVE 03/23/2017 2130   PROTEINUR 30 (A) 03/23/2017 2130   NITRITE NEGATIVE 03/23/2017 2130   LEUKOCYTESUR NEGATIVE 03/23/2017 2130   Sepsis Labs Invalid input(s): PROCALCITONIN,  WBC,  LACTICIDVEN Microbiology Recent Results (from the past 240 hour(s))  MRSA PCR Screening     Status: None   Collection Time: 03/20/17  5:08 PM  Result Value Ref Range Status   MRSA by PCR NEGATIVE NEGATIVE Final    Comment:        The GeneXpert MRSA Assay (FDA approved for NASAL specimens only), is one component of a comprehensive MRSA colonization surveillance program. It is not intended to diagnose MRSA infection nor to guide or monitor treatment for MRSA infections.      Time coordinating discharge: Over 30 minutes  SIGNED:   Debbe Odea, MD  [facility] 03/27/2017, 1:06 PM Pager   If 7PM-7AM, please contact night-coverage [email or web address] Password [email or web address]

## 2017-03-27 NOTE — Progress Notes (Signed)
Admit to unit and settled in bed, very lethargic upon arrival, answers to name called however drifts off to sleep. Very weak and cannot hold up arms to command. Able to follow commands however slow to respond and drowsy. Wife reported he has been like this all day. Pt reported he was tired and needed to sleep. VSS, oriented to room, rehab routine and plan of care.  Allowed pt to rest and reviewed information on admission with wife. Reviewed medications, rehab schedule routine and plan of care.  Dr Posey Pronto to see pt and requested recheck sats. Rechecked 02 sat = 93%. New order for o2 @ 2l/[name]. Margarito Liner

## 2017-03-27 NOTE — H&P (Signed)
Physical Medicine and Rehabilitation Admission H&P    Chief Complaint  Patient presents with  . deficits in mobility and ability to carry out ADL tasks.  . Right chest wall hematoma and delirium    HPI: Bryan Carney is a 76 y.o. male with history of CAD/PVD, PE-on chronic coumadin, T2DM, prostate cancer,  who was admitted on 03/20/17 with syncope, significant enlargement of right chest hematoma and weakness. History taken from chart review, patient, and family.  Coumadin reversed with FFP and Vitamin K and he was transfused with 2 units PRBC for Hgb of 6.4.  CT chest without signs of extravasation and not need for angiogram per IVR. No surgical intervention needed per IR. [name] and he was started on IV Cardizem to help manage tachycardia. Hospital course significant for A fib with RVR, diffuse weakness with tremors, difficulty following commands fluctuating bouts of lethargy, labile BP as well as acute kidney injury. Repeat head CT 10/1 wasunremarkable for acute process and delirium resolving. He has had edema RUE and dopplers positive for acute superficial thrombosis in cephalic vein at level of antecubital fossa. As H/H has been relatively stable, he was cleared to start Eliquis on 10/2 with recommendations to follow CBC closely and resume ASA in the future.  On later evaluation, patient noted to be lethargic with daughter and wife reporting decline in cognition since this morning.  His oxygen was d/ced this morning as well and O2 Sats noted to be around 92 on eval. He continues to have deficits in mobility and ability to carry out ADL tasks. CIR recommended for follow up therapy.    Review of Systems  Constitutional: Negative for chills and fever.       Poor po intake  HENT: Positive for hearing loss.   Eyes: Negative for blurred vision and double vision.  Respiratory: Negative for cough and shortness of breath.   Cardiovascular: Positive for chest pain (right chest wall pain). Negative  for palpitations and leg swelling.  Gastrointestinal: Negative for constipation, heartburn and nausea.  Genitourinary: Negative for dysuria and urgency.  Musculoskeletal: Positive for neck pain. Negative for back pain and myalgias.  Neurological: Positive for focal weakness. Negative for dizziness, speech change and headaches.  Psychiatric/Behavioral: Negative for memory loss. The patient is not nervous/anxious and does not have insomnia.   All other systems reviewed and are negative.    Past Medical History:  Diagnosis Date  . CAD (coronary artery disease)   . Diabetes ([place])   . Hx pulmonary embolism   . Hyperlipidemia   . Hypertension   . Prostate cancer ([place])   . PVOD (pulmonary veno-occlusive disease) ([place])   . Squamous cell carcinoma     Past Surgical History:  Procedure Laterality Date  . BACK SURGERY    . CARDIAC CATHETERIZATION  10/15/1997   Recommend complete revascularization by CABG  . CARDIOVASCULAR STRESS TEST  06/12/2012   No evidence of ischemia  . CAROTID DOPPLER  08/27/2012   Rt bulb/proximal ICA demonstrated a mild-moderate amount of fibrous plaque without evidence of a significant diameter reduction or any other vascular abnormality.  . CAROTID ENDARTERECTOMY Left 02/23/1995  . CORONARY ARTERY BYPASS GRAFT  10/19/1997   x5. LIMA-LAD, SVG-PDA, SVG-OM1, seq SVG-fist and second branches of ramus intermedius.    Family History  Problem Relation Age of Onset  . Heart attack Father   . Cancer Sister   . Diabetes Brother   . Cancer Sister   . Diabetes Brother   .  Diabetes Brother   . Diabetes Brother     Social History:  Married. Independent PTA and works 5 1/2 days/week. He  reports that he has never smoked. He has never used smokeless tobacco. He reports that he does not drink alcohol. His drug history is not on file.    Allergies  Allergen Reactions  . Lopressor [Metoprolol Tartrate] Other (See Comments)    Severe BP drop    Medications Prior to  Admission  Medication Sig Dispense Refill  . amitriptyline (ELAVIL) 10 MG tablet Take 10 mg by mouth at bedtime.    Marland Kitchen amLODipine (NORVASC) 5 MG tablet Take 5 mg by mouth at bedtime.     Marland Kitchen aspirin 325 MG tablet Take 325 mg by mouth daily.    . benazepril (LOTENSIN) 40 MG tablet Take 40 mg by mouth daily.    . ferrous sulfate 325 (65 FE) MG tablet Take 325 mg by mouth daily with breakfast.     . KOMBIGLYZE XR 10-998 MG TB24 Take 1 tablet by mouth daily.   0  . simvastatin (ZOCOR) 40 MG tablet Take 40 mg by mouth daily.    Marland Kitchen warfarin (COUMADIN) 5 MG tablet Take 1.5 to 2 tablets by mouth daily as directed by coumadin clinic (Patient taking differently: Take 7.5-10 mg by mouth See admin instructions. Take 10 mg on Thursday and Friday then take 7.5 mg all there other days) 60 tablet 2  . HYDROcodone-acetaminophen (NORCO/VICODIN) 5-325 MG tablet Take 1 tablet by mouth every 6 (six) hours as needed. 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  Transfers Overall transfer level: Needs assistance Equipment used: Rolling walker (2 wheeled), 2 person hand held assist Transfers: Sit to/from Stand Sit to Stand: +2 physical assistance, Min assist, Mod assist, Max assist Stand pivot transfers: Mod assist, +2 physical assistance General transfer comment: Pt able to stand with minA initially utilizing RW and able to amb to window. Pt requiring seated rest break at that time due to B LE weakness requiring up to max assist to lower to sitting. After seated rest break, able to recover and complete second sit<>stand.  Ambulation/Gait Ambulation/Gait assistance: Mod assist, +2 physical assistance Ambulation Distance (Feet): 5 Feet Assistive device: Rolling walker (2 wheeled) Gait Pattern/deviations: Shuffle, Trunk flexed, Narrow base of support General Gait Details: Amb in room with RW and modA+2, requiring seated rest break secondary to BLE weakness and bilat knee buckling, requiring maxA to lower into sitting. Able to recover and complete second sit<>stand with RW and minA+2 Gait velocity: Decreased Gait velocity interpretation: <1.8 ft/sec, indicative of risk for recurrent falls    ADL: ADL  Overall ADL's : Needs assistance/impaired Eating/Feeding: Minimal assistance, Sitting Grooming: Minimal assistance, Sitting Upper Body Bathing: Moderate assistance, Sitting Lower Body Bathing: Maximal assistance, Sit to/from stand Upper Body Dressing : Moderate assistance, Sitting Lower Body Dressing: Maximal assistance, Sit to/from stand Toilet Transfer: Minimal assistance, +2 for physical assistance, Moderate assistance, Maximal assistance Toilet Transfer Details (indicate cue type and reason): Min assist +2 for safety on initial stand. Pt becoming fatigued and requiring up to max assist to sit in chair. Poor motor planning noted with descent.  Toileting- Clothing Manipulation and Hygiene: Moderate assistance, Sit to/from  stand Functional mobility during ADLs: Minimal assistance, Moderate assistance, Maximal assistance, +2 for physical assistance General ADL Comments: Educated pt and wife on incorporation of R UE into functional tasks to improve recovery.   Cognition: Cognition Overall Cognitive Status: Impaired/Different from baseline Orientation Level: Oriented X4 Cognition Arousal/Alertness: Lethargic Behavior During Therapy: Flat affect Overall Cognitive Status: Impaired/Different from baseline Area of Impairment: Memory, Safety/judgement, Following commands, Problem solving Memory: Decreased short-term memory Following Commands: Follows one step commands with increased time, Follows one step commands inconsistently Safety/Judgement: Decreased awareness of deficits, Decreased awareness of safety Problem Solving: Slow processing, Decreased initiation, Difficulty sequencing, Requires verbal cues, Requires tactile cues General Comments: Pt at times slow to respond but waxes and wanes cognitively. At times very appropriate and able to maintain conversation, problem solve, and follow commands. However, will demonstrate decreased responsivity at times and simultaneously with poor ability to sequence and plan movement. Of note, during first episode SpO2 desaturation to 85% but when OT/PT engaged pt in conversation, SpO2 rebounded to >92% immediately.    Blood pressure (!) 167/63, pulse 74, temperature 98 F (36.7 C), temperature source Oral, resp. rate 14, height '5\' 6"'  (1.676 m), weight 64.9 kg (143 lb 1.3 oz), SpO2 96 %. Physical Exam  Nursing note and vitals reviewed. Constitutional: He is oriented to person, place, and time. He appears well-developed and well-nourished. He appears listless. He is easily aroused. No distress.  HENT:  Head: Normocephalic and atraumatic.  Eyes: Pupils are equal, round, and reactive to light. Conjunctivae and EOM are normal.  Neck: Normal range of motion. Neck supple.    Cardiovascular: Normal rate and regular rhythm.   Respiratory: Effort normal. No stridor. He has rales in the left lower field.  GI: Soft. Bowel sounds are normal. He exhibits no distension. There is no tenderness.  Musculoskeletal: He exhibits no tenderness.  Min edema right biceps with resolving ecchymosis and hematoma.   Neurological: Previously, he was oriented to person, place, and time and easily aroused. He appears listless.  HOH.  Left facial weakness. Disoriented Falls asleep when not engaged.  Question left inattention but able to move eyes to left field without cues.  Motor: Unable to participate at present Skin: Skin is warm and dry. He is not diaphoretic.  Right chest wall hematoma with decrease in size and surrounding changes.   Psychiatric: His affect is blunt. His speech is delayed. He expresses inappropriate judgment. He is inattentive.    Results for orders placed or performed during the hospital encounter of 03/20/17 (from the past 48 hour(s))  Basic metabolic panel     Status: Abnormal   Collection Time: 03/25/17  1:14 PM  Result Value Ref Range   Sodium 132 (L) 135 - 145 mmol/L   Potassium 3.7 3.5 - 5.1 mmol/L   Chloride 102 101 - 111 mmol/L   CO2 24 22 - 32 mmol/L   Glucose, Bld 244 (H)  65 - 99 mg/dL   BUN 29 (H) 6 - 20 mg/dL   Creatinine, Ser 1.33 (H) 0.61 - 1.24 mg/dL   Calcium 7.8 (L) 8.9 - 10.3 mg/dL   GFR calc non Af Amer 50 (L) >60 mL/min   GFR calc Af Amer 58 (L) >60 mL/min    Comment: (NOTE) The eGFR has been calculated using the CKD EPI equation. This calculation has not been validated in all clinical situations. eGFR's persistently <60 mL/min signify possible Chronic Kidney Disease.    Anion gap 6 5 - 15  Glucose, capillary     Status: Abnormal   Collection Time: 03/25/17  5:03 PM  Result Value Ref Range   Glucose-Capillary 179 (H) 65 - 99 mg/dL   Comment 1 Notify RN    Comment 2 Document in Chart   Glucose, capillary     Status: Abnormal    Collection Time: 03/25/17  9:20 PM  Result Value Ref Range   Glucose-Capillary 239 (H) 65 - 99 mg/dL  CBC     Status: Abnormal   Collection Time: 03/26/17  1:48 AM  Result Value Ref Range   WBC 9.7 4.0 - 10.5 K/uL   RBC 2.63 (L) 4.22 - 5.81 MIL/uL   Hemoglobin 7.9 (L) 13.0 - 17.0 g/dL   HCT 24.2 (L) 39.0 - 52.0 %   MCV 92.0 78.0 - 100.0 fL   MCH 30.0 26.0 - 34.0 pg   MCHC 32.6 30.0 - 36.0 g/dL   RDW 13.9 11.5 - 15.5 %   Platelets 275 150 - 400 K/uL  Basic metabolic panel     Status: Abnormal   Collection Time: 03/26/17  1:48 AM  Result Value Ref Range   Sodium 131 (L) 135 - 145 mmol/L   Potassium 3.5 3.5 - 5.1 mmol/L   Chloride 101 101 - 111 mmol/L   CO2 24 22 - 32 mmol/L   Glucose, Bld 159 (H) 65 - 99 mg/dL   BUN 25 (H) 6 - 20 mg/dL   Creatinine, Ser 1.32 (H) 0.61 - 1.24 mg/dL   Calcium 7.7 (L) 8.9 - 10.3 mg/dL   GFR calc non Af Amer 50 (L) >60 mL/min   GFR calc Af Amer 58 (L) >60 mL/min    Comment: (NOTE) The eGFR has been calculated using the CKD EPI equation. This calculation has not been validated in all clinical situations. eGFR's persistently <60 mL/min signify possible Chronic Kidney Disease.    Anion gap 6 5 - 15  Glucose, capillary     Status: Abnormal   Collection Time: 03/26/17  7:52 AM  Result Value Ref Range   Glucose-Capillary 176 (H) 65 - 99 mg/dL  Glucose, capillary     Status: Abnormal   Collection Time: 03/26/17 12:21 PM  Result Value Ref Range   Glucose-Capillary 244 (H) 65 - 99 mg/dL  Glucose, capillary     Status: Abnormal   Collection Time: 03/26/17  3:52 PM  Result Value Ref Range   Glucose-Capillary 175 (H) 65 - 99 mg/dL   Comment 1 Notify RN    Comment 2 Document in Chart   Glucose, capillary     Status: Abnormal   Collection Time: 03/26/17  9:13 PM  Result Value Ref Range   Glucose-Capillary 184 (H) 65 - 99 mg/dL   Comment 1 Notify RN    Comment 2 Document in Chart   CBC     Status: Abnormal   Collection Time: 03/27/17  2:48 AM   Result  Value Ref Range   WBC 10.0 4.0 - 10.5 K/uL   RBC 2.75 (L) 4.22 - 5.81 MIL/uL   Hemoglobin 8.7 (L) 13.0 - 17.0 g/dL   HCT 25.5 (L) 39.0 - 52.0 %   MCV 92.7 78.0 - 100.0 fL   MCH 31.6 26.0 - 34.0 pg   MCHC 34.1 30.0 - 36.0 g/dL   RDW 14.0 11.5 - 15.5 %   Platelets 304 150 - 400 K/uL  Glucose, capillary     Status: Abnormal   Collection Time: 03/27/17  7:50 AM  Result Value Ref Range   Glucose-Capillary 201 (H) 65 - 99 mg/dL   Ct Head Wo Contrast  Result Date: 03/25/2017 CLINICAL DATA:  76 year old male with unexplained altered mental status. EXAM: CT HEAD WITHOUT CONTRAST TECHNIQUE: Contiguous axial images were obtained from the base of the skull through the vertex without intravenous contrast. COMPARISON:  03/23/2017 and earlier. FINDINGS: Brain: Cerebral volume is within normal limits for age. No midline shift, ventriculomegaly, mass effect, evidence of mass lesion, intracranial hemorrhage or evidence of cortically based acute infarction. Mild for age scattered white matter hypodensity, including in the anterior limb of the left internal capsule. No cortical encephalomalacia identified. Stable gray-white matter differentiation. Vascular: Fairly extensive Calcified atherosclerosis at the skull base. No suspicious intracranial vascular hyperdensity. Skull: No acute osseous abnormality identified. Sinuses/Orbits: Chronic left maxillary sinus mucoperiosteal thickening. Other visible paranasal sinuses and mastoids are stable and well pneumatized. Other: Calcified atherosclerosis of the scalp vasculature. No acute orbit or scalp soft tissue findings. IMPRESSION: No acute intracranial abnormality and stable non contrast CT appearance of the brain since 03/23/2017. Electronically Signed   By: Genevie Ann M.D.   On: 03/25/2017 12:43       Medical Problem List and Plan: 1.  Limitations with mobility, weakness, and ability to complete ADLs secondary to debility and delirium. 2.  PE/Superfical DVT  RUE cephalic vein/anticoagulation: Pharmaceutical: Other (comment)--Eliquis 3. Pain Management: tylenol prn 4. Mood: LCSW to follow for evaluation and support.  5. Neuropsych: This patient is not fully capable of making decisions on his own behalf. 6. Skin/Wound Care: Pressure relief measures.  7. Fluids/Electrolytes/Nutrition: Monitor I/O. Check weighs daily. Add protein supplement 8. Right chest hematoma with ABLA: H/H stable. Will order CBC every 3 days to monitor for stability as anticoagulation resumed.  9. CAD/PAF: RVR has resolved. On Benazepril, diltiazem and atorvastatin. Hold ASA for now.  Will continue to monitor HR bid 10.HTN: Monitor BP bid. Continues to be labile will monitor bid and titrate medications as indicated.  11. Acute kidney injury: SCr improved from 1.52--> 1.32. Continue to encourage fluid intake and avoid nephrotoxic mediations.  12. Leucocytosis: WBC trending back up with low grade fever. Encourage IS with flutter valve to help with atelectasis and monitor for signs of infection. 13. T2DM: Was on Kombiglyze 10/998 daily. No metformin due to AKI--question CKD? Will start low dose amaryl and Trajenta. Will  wean off Lantus.  Continue SSI for elevated BS.  14. Hyponatremia: Check daily weights. Recheck labs in am.    15. Delirium: Much improved but not fully cleared. Will check CXR to follow up.  16. Lethargy: Supplemental O2 restarted, will consider head CT.   Post Admission Physician Evaluation: 1. Preadmission assessment reviewed and changes made below. 2. Functional deficits secondary  to debility and delirium. 3. Patient is admitted to receive collaborative, interdisciplinary care between the physiatrist, rehab nursing staff, and therapy team. 4. Patient's level of medical complexity and substantial  therapy needs in context of that medical necessity cannot be provided at a lesser intensity of care such as a SNF. 5. Patient has experienced substantial functional  loss from his/her baseline which was documented above under the "Functional History" and "Functional Status" headings.  Judging by the patient's diagnosis, physical exam, and functional history, the patient has potential for functional progress which will result in measurable gains while on inpatient rehab.  These gains will be of substantial and practical use upon discharge  in facilitating mobility and self-care at the household level. 15. Physiatrist will provide 24 hour management of medical needs as well as oversight of the therapy plan/treatment and provide guidance as appropriate regarding the interaction of the two. 7. 24 hour rehab nursing will assist with safety, skin/wound care, disease management, pain management and patient education  and help integrate therapy concepts, techniques,education, etc. 8. PT will assess and treat for/with: Lower extremity strength, range of motion, stamina, balance, functional mobility, safety, adaptive techniques and equipment, woundcare, coping skills, pain control, education.   Goals are: Min A. 9. OT will assess and treat for/with: ADL's, functional mobility, safety, upper extremity strength, adaptive techniques and equipment, wound mgt, ego support, and community reintegration.   Goals are: Min A. Therapy may proceed with showering this patient. 10. Case Management and Social Worker will assess and treat for psychological issues and discharge planning. 11. Team conference will be held weekly to assess progress toward goals and to determine barriers to discharge. 12. Patient will receive at least 3 hours of therapy per day at least 5 days per week. 13. ELOS: 14-18 days.       14. Prognosis:  good  Delice Lesch, MD, ABPMR [facility] [place], [place] 03/27/2017

## 2017-03-27 NOTE — Progress Notes (Signed)
Pt aroused for dinner. CBG 188. Sat pt up for dinner meal and medication. Aroused better than previous note, opened eyes and attempted to feed self. Required occasional scoop food onto spoon however held cup and fed self the meal. Pt more alert, answering questions, moving all extremities although rt UE limited by edema and do note occasional tremors of arms. Wife reported she was less anxious about pt as he was acting more normal than earlier in the day. Pt denied pain or discomfort. Tolerated meds whole with water. Margarito Liner

## 2017-03-27 NOTE — Progress Notes (Signed)
I have insurance approval and bed for CIR today. Wife, pt , Dr. Wynelle Cleveland, RN CM and SW are aware. I will make the arraignments. [phone]

## 2017-03-27 NOTE — Progress Notes (Signed)
Bryan Arn, MD Physician Signed Physical Medicine and Rehabilitation  Consult Note Date of Service: 03/25/2017 12:00 PM  Related encounter: ED to Hosp-Admission (Discharged) from 03/20/2017 in [facility] All Collapse All   [] Hide copied text [] Hover for attribution information      Physical Medicine and Rehabilitation Consult   Reason for Consult: Deficits in mobility ability   Referring Physician:  Dr.Bhandari.    HPI: Bryan Carney is a 76 y.o. male with history of CAD/PVD, PE-on chronic coumadin, T2DM, prostate cancer,  who was admitted on 03/20/17 with syncope, significant enlargement of right chest hematoma and weakness. History taken from chart review, patient, and family.  Coumadin reversed with FFP and Vitamin K and he was transfused with 2 units PRBC for Hgb of 6.4.  CT chest without signs of extravasation and not need for angiogram per IVR. No surgical intervention needed per IR. Bryan Carney and he was started on IV Cardizem to help manage tachycardia. Hospital course significant for A fib with RVR, diffuse weakness with tremors, difficulty following commands fluctuating bouts of lethargy and heavy posterior lean with transfer attempts. Head CT 10/1 reviewed, unremarkable for acute process. CIR recommended due to deficits in mobility.    Review of Systems  HENT: Negative for hearing loss and tinnitus.   Eyes: Negative for blurred vision and double vision.  Respiratory: Negative for cough and shortness of breath.   Cardiovascular: Negative for chest pain and palpitations.  Gastrointestinal: Negative for heartburn and nausea.  Genitourinary: Negative for dysuria and urgency.  Musculoskeletal: Negative for back pain and myalgias.  Skin: Negative for itching and rash.  Neurological: Positive for weakness. Negative for dizziness and headaches.  Psychiatric/Behavioral: The patient is not nervous/anxious.        Wife reporting issue with  confusion since last Friday.  All other systems reviewed and are negative.         Past Medical History:  Diagnosis Date  . CAD (coronary artery disease)   . Diabetes ([place])   . Hx pulmonary embolism   . Hyperlipidemia   . Hypertension   . Prostate cancer ([place])   . PVOD (pulmonary veno-occlusive disease) ([place])   . Squamous cell carcinoma          Past Surgical History:  Procedure Laterality Date  . BACK SURGERY    . CARDIAC CATHETERIZATION  10/15/1997   Recommend complete revascularization by CABG  . CARDIOVASCULAR STRESS TEST  06/12/2012   No evidence of ischemia  . CAROTID DOPPLER  08/27/2012   Rt bulb/proximal ICA demonstrated a mild-moderate amount of fibrous plaque without evidence of a significant diameter reduction or any other vascular abnormality.  . CAROTID ENDARTERECTOMY Left 02/23/1995  . CORONARY ARTERY BYPASS GRAFT  10/19/1997   x5. LIMA-LAD, SVG-PDA, SVG-OM1, seq SVG-fist and second branches of ramus intermedius.         Family History  Problem Relation Age of Onset  . Heart attack Father   . Cancer Sister   . Diabetes Brother   . Cancer Sister   . Diabetes Brother   . Diabetes Brother   . Diabetes Brother     Social History:  reports that he has never smoked. He has never used smokeless tobacco. He reports that he does not drink alcohol. His drug history is not on file.         Allergies  Allergen Reactions  . Lopressor [Metoprolol Tartrate] Other (See Comments)  Severe BP drop          Medications Prior to Admission  Medication Sig Dispense Refill  . amitriptyline (ELAVIL) 10 MG tablet Take 10 mg by mouth at bedtime.    Marland Kitchen amLODipine (NORVASC) 5 MG tablet Take 5 mg by mouth at bedtime.     Marland Kitchen aspirin 325 MG tablet Take 325 mg by mouth daily.    . benazepril (LOTENSIN) 40 MG tablet Take 40 mg by mouth daily.    . ferrous sulfate 325 (65 FE) MG tablet Take 325 mg by mouth daily with breakfast.       . KOMBIGLYZE XR 10-998 MG TB24 Take 1 tablet by mouth daily.   0  . simvastatin (ZOCOR) 40 MG tablet Take 40 mg by mouth daily.    Marland Kitchen warfarin (COUMADIN) 5 MG tablet Take 1.5 to 2 tablets by mouth daily as directed by coumadin clinic (Patient taking differently: Take 7.5-10 mg by mouth See admin instructions. Take 10 mg on Thursday and Friday then take 7.5 mg all there other days) 60 tablet 2  . HYDROcodone-acetaminophen (NORCO/VICODIN) 5-325 MG tablet Take 1 tablet by mouth every 6 (six) hours as needed. 6 tablet 0    Home: Home Living Family/patient expects to be discharged to:: Private residence Living Arrangements: Spouse/significant other Available Help at Discharge: Family, Available 24 hours/day Type of Home: House Home Access: Level entry Home Layout: One level Bathroom Shower/Tub: Multimedia programmer: Handicapped height Bathroom Accessibility: Yes Home Equipment: None  Functional History: Prior Function Level of Independence: Independent Comments: owns car dealership, works full time, Ambulance person Status:  Mobility: Bed Mobility Overal bed mobility: Needs Assistance Bed Mobility: Sit to Supine Supine to sit: Max assist Sit to supine: Mod assist, +2 for physical assistance General bed mobility comments: mod A for LE management onto bed Transfers Overall transfer level: Needs assistance Equipment used: Rolling walker (2 wheeled), 2 person hand held assist Transfers: Sit to/from Stand, Stand Pivot Transfers Sit to Stand: Mod assist, +2 physical assistance Stand pivot transfers: Mod assist, +2 physical assistance General transfer comment: Attempted to stand to RW with one person assist and pt struggling to process commands as well as weakness UE and LES with tremors and poor coordination, therefore sat pt back down.  Noted facial droop and called nursing to come in.  Nurse assessed pt as above and determined to get pt back in bed to rest.  Needed +2 HHA  to get into chair needing mod assit and cues with pt able to step around with cues and assist.  Heavy posterior lean with pivot transfer  ADL:  Cognition: Cognition Overall Cognitive Status: Impaired/Different from baseline Orientation Level: Oriented X4 Cognition Arousal/Alertness: Lethargic Behavior During Therapy: Flat affect Overall Cognitive Status: Impaired/Different from baseline Area of Impairment: Memory, Safety/judgement, Following commands, Problem solving Memory: Decreased short-term memory Following Commands: Follows one step commands with increased time, Follows one step commands inconsistently Safety/Judgement: Decreased awareness of deficits, Decreased awareness of safety Problem Solving: Slow processing, Decreased initiation, Difficulty sequencing, Requires verbal cues, Requires tactile cues General Comments: Pt slow to respond.  Had to place hand on chair as pt not following commands.  Pt also noted to have left facial droop.  CAlled nurse to room and nurse assessed pt.  REsults of assessment incomplete as he waxes and wanes with symptoms.  nurse to notify MD.    Blood pressure 127/66, pulse 72, temperature 98 F (36.7 C), temperature source Axillary, resp. rate 20, height  5\' 6"  (1.676 m), weight 64.9 kg (143 lb 1.3 oz), SpO2 94 %. Physical Exam  Nursing note and vitals reviewed. Constitutional: He is oriented to person, place, and time. He appears well-developed and well-nourished. He appears lethargic. He is easily aroused. Nasal cannula in place.  HENT:  Head: Normocephalic and atraumatic.  Eyes: Pupils are equal, round, and reactive to light. Conjunctivae and EOM are normal.  Neck: Normal range of motion. Neck supple.  Cardiovascular: Normal rate.  An irregularly irregular rhythm present.  Murmur heard. Respiratory: Effort normal and breath sounds normal. No stridor. He exhibits tenderness (diffuse ecchymosis upper chest wall and RUE).  GI: Soft. Bowel sounds  are normal. He exhibits no distension. There is no tenderness.  Musculoskeletal: He exhibits edema and tenderness.  RUE  Limited due to pain for edema and ecchymosis.   Neurological: He is oriented to person, place, and time and easily aroused. He appears lethargic.  He was able to follow simple one step commands. Speech clear.  Left facial weakness.  Motor: RUE: Limited by pain, hand grip 2/5 LUE: 5/5 proximal to distal LLE: 4/5 HF, KE, 4+/5 ADF/PF RLE: 4-/5 HF, KE, 4/5 ADF/PF  Skin: Skin is warm and dry. He is not diaphoretic.  Psychiatric: His affect is blunt. He is slowed.    Lab Results Last 24 Hours       Results for orders placed or performed during the hospital encounter of 03/20/17 (from the past 24 hour(s))  Glucose, capillary     Status: Abnormal   Collection Time: 03/24/17  3:37 PM  Result Value Ref Range   Glucose-Capillary 273 (H) 65 - 99 mg/dL   Comment 1 Notify RN    Comment 2 Document in Chart   Glucose, capillary     Status: Abnormal   Collection Time: 03/24/17  9:25 PM  Result Value Ref Range   Glucose-Capillary 222 (H) 65 - 99 mg/dL  CBC     Status: Abnormal   Collection Time: 03/25/17  2:50 AM  Result Value Ref Range   WBC 9.2 4.0 - 10.5 K/uL   RBC 2.65 (L) 4.22 - 5.81 MIL/uL   Hemoglobin 8.1 (L) 13.0 - 17.0 g/dL   HCT 24.5 (L) 39.0 - 52.0 %   MCV 92.5 78.0 - 100.0 fL   MCH 30.6 26.0 - 34.0 pg   MCHC 33.1 30.0 - 36.0 g/dL   RDW 14.4 11.5 - 15.5 %   Platelets 230 150 - 400 K/uL  Glucose, capillary     Status: Abnormal   Collection Time: 03/25/17  8:27 AM  Result Value Ref Range   Glucose-Capillary 221 (H) 65 - 99 mg/dL   Comment 1 Notify RN    Comment 2 Document in Chart   Brain natriuretic peptide     Status: Abnormal   Collection Time: 03/25/17 10:13 AM  Result Value Ref Range   B Natriuretic Peptide 410.3 (H) 0.0 - 100.0 pg/mL  Glucose, capillary     Status: Abnormal   Collection Time: 03/25/17 11:38 AM  Result  Value Ref Range   Glucose-Capillary 344 (H) 65 - 99 mg/dL   Comment 1 Notify RN    Comment 2 Document in Chart       Imaging Results (Last 48 hours)  Ct Head Wo Contrast  Result Date: 03/23/2017 CLINICAL DATA:  Altered level of consciousness EXAM: CT HEAD WITHOUT CONTRAST TECHNIQUE: Contiguous axial images were obtained from the base of the skull through the vertex without intravenous contrast.  COMPARISON:  None. FINDINGS: Brain: Chronic mild to moderate small vessel ischemic disease. No acute intracranial hemorrhage, midline shift or edema. No intra-axial mass nor extra-axial fluid collections. No hydrocephalus. Mild superficial and central atrophy. Vascular: Moderate atherosclerosis of the carotid siphons. No hyperdense vessels. Skull: No acute skull fracture. Sinuses/Orbits: There is mild circumferential mucosal thickening of the left maxillary sinus with thickened maxillary sinus walls consistent with chronic sinusitis. Mild ethmoid sinus mucosal thickening. Clear sphenoid and frontal sinuses. Intact orbits and globes. Other: None IMPRESSION: 1. Chronic small vessel ischemic disease of periventricular white matter with atrophy. 2. No acute intracranial abnormality. 3. Chronic left maxillary sinusitis. Electronically Signed   By: Ashley Royalty M.D.   On: 03/23/2017 23:47     Assessment/Plan: Diagnosis: Weakness Labs and images independently reviewed.  Records reviewed and summated above.  1. Does the need for close, 24 hr/day medical supervision in concert with the patient's rehab needs make it unreasonable for this patient to be served in a less intensive setting? Yes 2. Co-Morbidities requiring supervision/potential complications: A fib with RVR (monitor HR with increased activity), CAD/PVD (cont meds), PE (cont meds when appropriate), T2 DM (Monitor in accordance with exercise and adjust meds as necessary), prostate cancer, HTN (monitor and provide prns in accordance with increased  physical exertion and pain), ABLA (transfuse if necessary to ensure appropriate perfusion for increased activity tolerance) 3. Due to safety, disease management and patient education, does the patient require 24 hr/day rehab nursing? Yes 4. Does the patient require coordinated care of a physician, rehab nurse, PT (1-2 hrs/day, 5 days/week) and OT (1-2 hrs/day, 5 days/week) to address physical and functional deficits in the context of the above medical diagnosis(es)? Yes Addressing deficits in the following areas: balance, endurance, locomotion, strength, transferring, bathing, dressing, toileting and psychosocial support 5. Can the patient actively participate in an intensive therapy program of at least 3 hrs of therapy per day at least 5 days per week? Potentially 6. The potential for patient to make measurable gains while on inpatient rehab is excellent 7. Anticipated functional outcomes upon discharge from inpatient rehab are supervision  with PT, supervision with OT, n/a with SLP. 8. Estimated rehab length of stay to reach the above functional goals is: 17-20 days. 9. Anticipated D/C setting: Home 10. Anticipated post D/C treatments: HH therapy and Home excercise program 11. Overall Rehab/Functional Prognosis: good  RECOMMENDATIONS: This patient's condition is appropriate for continued rehabilitative care in the following setting: CIR after completion of medical workup and medically stable. Patient has agreed to participate in recommended program. Potentially Note that insurance prior authorization may be required for reimbursement for recommended care.  Comment: Rehab Admissions Coordinator to follow up.  Delice Lesch, MD, ABPMR Bary Leric[place], [place] 03/25/2017    Revision History                   Routing History

## 2017-03-27 NOTE — Clinical Social Work Note (Signed)
Received notification from [facility] admissions coordinator that insurance authorization has been approved.   CSW signing off.   Dayton Scrape, [place]

## 2017-03-28 ENCOUNTER — Inpatient Hospital Stay (HOSPITAL_COMMUNITY): Payer: Medicare Other | Admitting: Speech Pathology

## 2017-03-28 ENCOUNTER — Inpatient Hospital Stay (HOSPITAL_COMMUNITY): Payer: Medicare Other | Admitting: Physical Therapy

## 2017-03-28 ENCOUNTER — Inpatient Hospital Stay (HOSPITAL_COMMUNITY): Payer: Medicare Other | Admitting: Occupational Therapy

## 2017-03-28 DIAGNOSIS — I10 Essential (primary) hypertension: Secondary | ICD-10-CM

## 2017-03-28 DIAGNOSIS — R5381 Other malaise: Principal | ICD-10-CM

## 2017-03-28 DIAGNOSIS — J690 Pneumonitis due to inhalation of food and vomit: Secondary | ICD-10-CM

## 2017-03-28 DIAGNOSIS — E871 Hypo-osmolality and hyponatremia: Secondary | ICD-10-CM

## 2017-03-28 LAB — CBC WITH DIFFERENTIAL/PLATELET
Basophils Absolute: 0 10*3/uL (ref 0.0–0.1)
Basophils Relative: 0 %
Eosinophils Absolute: 0 10*3/uL (ref 0.0–0.7)
Eosinophils Relative: 0 %
HEMATOCRIT: 24.6 % — AB (ref 39.0–52.0)
HEMOGLOBIN: 8.4 g/dL — AB (ref 13.0–17.0)
LYMPHS ABS: 0.5 10*3/uL — AB (ref 0.7–4.0)
LYMPHS PCT: 4 %
MCH: 31.7 pg (ref 26.0–34.0)
MCHC: 34.1 g/dL (ref 30.0–36.0)
MCV: 92.8 fL (ref 78.0–100.0)
Monocytes Absolute: 1.3 10*3/uL — ABNORMAL HIGH (ref 0.1–1.0)
Monocytes Relative: 13 %
NEUTROS ABS: 8.5 10*3/uL — AB (ref 1.7–7.7)
NEUTROS PCT: 83 %
Platelets: 356 10*3/uL (ref 150–400)
RBC: 2.65 MIL/uL — AB (ref 4.22–5.81)
RDW: 14 % (ref 11.5–15.5)
WBC: 10.3 10*3/uL (ref 4.0–10.5)

## 2017-03-28 LAB — COMPREHENSIVE METABOLIC PANEL
ALT: 30 U/L (ref 17–63)
AST: 22 U/L (ref 15–41)
Albumin: 2.4 g/dL — ABNORMAL LOW (ref 3.5–5.0)
Alkaline Phosphatase: 65 U/L (ref 38–126)
Anion gap: 10 (ref 5–15)
BUN: 32 mg/dL — ABNORMAL HIGH (ref 6–20)
CHLORIDE: 99 mmol/L — AB (ref 101–111)
CO2: 24 mmol/L (ref 22–32)
Calcium: 8.1 mg/dL — ABNORMAL LOW (ref 8.9–10.3)
Creatinine, Ser: 1.24 mg/dL (ref 0.61–1.24)
GFR, EST NON AFRICAN AMERICAN: 54 mL/min — AB (ref >60)
Glucose, Bld: 204 mg/dL — ABNORMAL HIGH (ref 65–99)
POTASSIUM: 3.7 mmol/L (ref 3.5–5.1)
SODIUM: 133 mmol/L — AB (ref 135–145)
Total Bilirubin: 2 mg/dL — ABNORMAL HIGH (ref 0.3–1.2)
Total Protein: 5.3 g/dL — ABNORMAL LOW (ref 6.5–8.1)

## 2017-03-28 LAB — GLUCOSE, CAPILLARY
GLUCOSE-CAPILLARY: 197 mg/dL — AB (ref 65–99)
GLUCOSE-CAPILLARY: 167 mg/dL — AB (ref 65–99)
GLUCOSE-CAPILLARY: 96 mg/dL (ref 65–99)
Glucose-Capillary: 309 mg/dL — ABNORMAL HIGH (ref 65–99)

## 2017-03-28 MED ORDER — PIPERACILLIN-TAZOBACTAM 3.375 G IVPB
3.3750 g | Freq: Three times a day (TID) | INTRAVENOUS | Status: DC
  Administered 2017-03-28 – 2017-04-02 (×15): 3.375 g via INTRAVENOUS
  Filled 2017-03-28 (×17): qty 50

## 2017-03-28 NOTE — Evaluation (Signed)
Speech Language Pathology Assessment and Plan  Patient Details  Name: Bryan Carney MRN: [id number] Date of Birth: 1940/07/05  SLP Diagnosis: Cognitive Impairments  Rehab Potential: Good ELOS: 2 weeks    Today's Date: 03/28/2017 SLP Individual Time: 2202-5427 SLP Individual Time Calculation (min): 60 min   Problem List:  Patient Active Problem List   Diagnosis Date Noted  . Debility 03/27/2017  . Chest wall hematoma, right, sequela   . AKI (acute kidney injury) ([place])   . Leukocytosis   . Delirium   . Clot   . Hypoxia   . Lethargic   . Acute delirium   . Coronary artery disease involving native coronary artery of native heart without angina pectoris   . Prostate cancer ([place])   . Paroxysmal atrial fibrillation ([facility])   . Coagulopathy ([place])   . Acute blood loss anemia 03/20/2017  . Hematoma 03/20/2017  . Hyponatremia 03/20/2017  . Supratherapeutic INR 03/20/2017  . Essential hypertension 07/07/2014  . Hyperlipidemia 07/07/2014  . Coronary artery disease due to lipid rich plaque 07/07/2014  . Carotid artery disease ([place]) 07/07/2014  . Type 2 diabetes mellitus without complication, without long-term current use of insulin ([place]) 07/07/2014  . Long term (current) use of anticoagulants 10/14/2012  . Acute pulmonary embolism ([place]) 10/14/2012   Past Medical History:  Past Medical History:  Diagnosis Date  . CAD (coronary artery disease)   . Diabetes ([place])   . Hx pulmonary embolism   . Hyperlipidemia   . Hypertension   . Prostate cancer ([place])   . PVOD (pulmonary veno-occlusive disease) ([place])   . Squamous cell carcinoma    Past Surgical History:  Past Surgical History:  Procedure Laterality Date  . BACK SURGERY    . CARDIAC CATHETERIZATION  10/15/1997   Recommend complete revascularization by CABG  . CARDIOVASCULAR STRESS TEST  06/12/2012   No evidence of ischemia  . CAROTID DOPPLER  08/27/2012   Rt bulb/proximal ICA demonstrated a mild-moderate amount of fibrous plaque  without evidence of a significant diameter reduction or any other vascular abnormality.  . CAROTID ENDARTERECTOMY Left 02/23/1995  . CORONARY ARTERY BYPASS GRAFT  10/19/1997   x5. LIMA-LAD, SVG-PDA, SVG-OM1, seq SVG-fist and second branches of ramus intermedius.    Assessment / Plan / Recommendation Clinical Impression Bryan Carney a 76 y.o.malewith history of CAD/PVD, PE-on chronic coumadin, T2DM, prostate cancer, who was admitted on 03/20/17 with syncope, significant enlargement of right chest hematoma and weakness. History taken from chart review, patient, and family. Coumadin reversed with FFP and Vitamin K and he was transfused with 2 units PRBC for Hgb of 6.4. CT chest without signs of extravasation and not need for angiogram per IVR. No surgical intervention needed per IR. Bryan Carney and he was started on IV Cardizem to help manage tachycardia. Hospital course significant for A fib with RVR, diffuse weakness with tremors, difficulty following commands fluctuating bouts of lethargy, labile BP as well as acute kidney injury. Repeat head CT 10/1 wasunremarkable for acute process and delirium resolving. He has had edema RUE and dopplers positive for acute superficial thrombosis in cephalic vein at level of antecubital fossa. As H/H has been relatively stable, he was cleared to start Eliquis on 10/2 with recommendations to follow CBC closely and resume ASA in the future. On later evaluation, patient noted to be lethargic with daughter and wife reporting decline in cognition since this morning. His oxygen was d/ced this morning as well and O2 Sats noted to be around 92  on eval. He continues to have deficits in mobility and ability to carry out ADL tasks. CIR recommended for follow up therapy and admited on 03/27/17.   Cognitive linguistic evaluation completed on 03/28/17.  Pt presents with moderate cognitive impairments c/b significant deficits in recall of new information and sustained attention. These  deficits contributed to increased processing, off topic comments, difficulty following multistep directions, mildly complex problem solving and some perseverative comments (suspect d/t decreased ability to recall what he needed to say and inattention - not a language disorder at this time). Recommend skilled ST to address the above mentioned deficits to increase function independence and reduce caregiver burden. Anticipate that pt may need 24 hour supervision and follow HHST services at discharge.    Skilled Therapeutic Interventions          Skilled treatment session focused on completion of cognitive linguistic evaluation, see above. Pt required Max A verbal cues for sustained attention to task and was not able to recall new information with Max A cues. Family friend was present during evaluation. She stated that she was a PA and pt's O2 stats frequently dropped to low 80's. Question possible hypoxia effect on pt's cognition. Education provided and all questions answered to friend's satisfaction. She was texting with pt's daughter and education provided on POC, goals and prognosis. Anticipate that pt will need 24 hours upervision and follow up therapies at discharge. She voiced understanding.    SLP Assessment  Patient will need skilled [place] [facility] during CIR admission    Recommendations  Recommendations for Other Services: Neuropsych consult Patient destination: Home Follow up Recommendations: Home Health SLP;24 hour supervision/assistance Equipment Recommended: None recommended by SLP    SLP Frequency 3 to 5 out of 7 days   SLP Duration  SLP Intensity  SLP Treatment/Interventions 2 weeks  Minumum of 1-2 x/day, 30 to 90 minutes  Cognitive remediation/compensation;Patient/family education    Pain Pain Assessment Pain Assessment: No/denies pain Pain Score: 0-No pain  Prior Functioning Cognitive/Linguistic Baseline: Within functional limits Type of Home: House   Lives With: Spouse Available Help at Discharge: Family;Available 24 hours/day Vocation: Full time employment  Function:   Cognition Comprehension Comprehension assist level: Understands basic 75 - 89% of the time/ requires cueing 10 - 24% of the time  Expression   Expression assist level: Expresses basic needs/ideas: With extra time/assistive device  Social Interaction Social Interaction assist level: Interacts appropriately 75 - 89% of the time - Needs redirection for appropriate language or to initiate interaction.;Interacts appropriately 90% of the time - Needs monitoring or encouragement for participation or interaction.  Problem Solving Problem solving assist level: Solves basic 75 - 89% of the time/requires cueing 10 - 24% of the time  Memory Memory assist level: Recognizes or recalls 25 - 49% of the time/requires cueing 50 - 75% of the time   Short Term Goals: Week 1: SLP Short Term Goal 1 (Week 1): Pt will utilize external memory aids to recall new information with Mod A cues.  SLP Short Term Goal 2 (Week 1): Pt will sustain attention to task for ~ 5 minutes with Mod A cues.  SLP Short Term Goal 3 (Week 1): Pt will complete mildly complex problem solving task with Min A cues.  SLP Short Term Goal 4 (Week 1): Pt will respond to mildly complex yes/no questions in timely manner, with 90% accuracy and superviison cues for timeliness of response.  SLP Short Term Goal 5 (Week 1): Pt will demonstrate  intellectual awareness by listing 2 physical and 2 cognitive deficits that are acute with Min A cues.   Refer to Care Plan for Long Term Goals  Recommendations for other services: Neuropsych  Discharge Criteria: Patient will be discharged from SLP if patient refuses treatment 3 consecutive times without medical reason, if treatment goals not met, if there is a change in medical status, if patient makes no progress towards goals or if patient is discharged from hospital.  The above  assessment, treatment plan, treatment alternatives and goals were discussed and mutually agreed upon: by patient and family friend.   Tyrice Hewitt 03/28/2017, 11:52 AM

## 2017-03-28 NOTE — Evaluation (Signed)
Occupational Therapy Assessment and Plan  Patient Details  Name: Bryan Carney MRN: [id number] Date of Birth: 10/02/1940  OT Diagnosis: cognitive deficits, muscle weakness (generalized) and coordination disorder Rehab Potential: Rehab Potential (ACUTE ONLY): Good ELOS: 2 weeks   Today's Date: 03/28/2017 OT Individual Time: 1430-1555 OT Individual Time Calculation (min): 85 min     Problem List:  Patient Active Problem List   Diagnosis Date Noted  . Debility 03/27/2017  . Chest wall hematoma, right, sequela   . AKI (acute kidney injury) (HCC)   . Leukocytosis   . Delirium   . Clot   . Hypoxia   . Lethargic   . Acute delirium   . Coronary artery disease involving native coronary artery of native heart without angina pectoris   . Prostate cancer (HCC)   . Paroxysmal atrial fibrillation (HCC)   . Coagulopathy (HCC)   . Acute blood loss anemia 03/20/2017  . Hematoma 03/20/2017  . Hyponatremia 03/20/2017  . Supratherapeutic INR 03/20/2017  . Essential hypertension 07/07/2014  . Hyperlipidemia 07/07/2014  . Coronary artery disease due to lipid rich plaque 07/07/2014  . Carotid artery disease (HCC) 07/07/2014  . Type 2 diabetes mellitus without complication, without long-term current use of insulin (HCC) 07/07/2014  . Long term (current) use of anticoagulants 10/14/2012  . Acute pulmonary embolism (HCC) 10/14/2012    Past Medical History:  Past Medical History:  Diagnosis Date  . CAD (coronary artery disease)   . Diabetes (HCC)   . Hx pulmonary embolism   . Hyperlipidemia   . Hypertension   . Prostate cancer (HCC)   . PVOD (pulmonary veno-occlusive disease) (HCC)   . Squamous cell carcinoma    Past Surgical History:  Past Surgical History:  Procedure Laterality Date  . BACK SURGERY    . CARDIAC CATHETERIZATION  10/15/1997   Recommend complete revascularization by CABG  . CARDIOVASCULAR STRESS TEST  06/12/2012   No evidence of ischemia  . CAROTID DOPPLER  08/27/2012    Rt bulb/proximal ICA demonstrated a mild-moderate amount of fibrous plaque without evidence of a significant diameter reduction or any other vascular abnormality.  . CAROTID ENDARTERECTOMY Left 02/23/1995  . CORONARY ARTERY BYPASS GRAFT  10/19/1997   x5. LIMA-LAD, SVG-PDA, SVG-OM1, seq SVG-fist and second branches of ramus intermedius.    Assessment & Plan Clinical Impression: Patient is a 76 y.o. year old male with history of CAD/PVD, PE-on chronic coumadin, T2DM, prostate cancer, who was admitted on 03/20/17 with syncope, significant enlargement of right chest hematoma and weakness. History taken from chart review, patient, and family. Coumadin reversed with FFP and Vitamin K and he was transfused with 2 units PRBC for Hgb of 6.4. CT chest without signs of extravasation and not need for angiogram per IVR. No surgical intervention needed per IR. Ingram and he was started on IV Cardizem to help manage tachycardia. Hospital course significant for A fib with RVR, diffuse weakness with tremors, difficulty following commands fluctuating bouts of lethargy, labile BP as well as acute kidney injury. Repeat head CT 10/1 wasunremarkable for acute process and delirium resolving. He has had edema RUE and dopplers positive for acute superficial thrombosis in cephalic vein at level of antecubital fossa. As H/H has been relatively stable, he was cleared to start Eliquis on 10/2 with recommendations to follow CBC closely and resume ASA in the future.  On later evaluation, patient noted to be lethargic with daughter and wife reporting decline in cognition since this morning.  His oxygen was   d/ced this morning as well and O2 Sats noted to be around 92 on eval. He continues to have deficits in mobility and ability to carry out ADL tasks. CIR recommended for follow up therapy.  Patient transferred to CIR on 03/27/2017 .    Patient currently requires mod with basic self-care skills secondary to muscle weakness, decreased  cardiorespiratoy endurance, decreased coordination and decreased motor planning, decreased initiation, decreased attention, decreased awareness, decreased problem solving, decreased safety awareness, decreased memory and delayed processing and decreased sitting balance, decreased standing balance, decreased postural control and decreased balance strategies.  Prior to hospitalization, patient could complete ADls and IADLs with independent .  Patient will benefit from skilled intervention to increase independence with basic self-care skills prior to discharge home with care partner.  Anticipate patient will require 24 hour supervision and follow up outpatient.  OT - End of Session Activity Tolerance: Decreased this session Endurance Deficit: Yes Endurance Deficit Description: requires seated rest breaks with short duration mobility activities and lethargic this session.  OT Assessment Rehab Potential (ACUTE ONLY): Good OT Barriers to Discharge: Other (comments) OT Barriers to Discharge Comments: none known at this time OT Patient demonstrates impairments in the following area(s): Balance;Cognition;Edema;Endurance;Motor;Pain;Safety;Skin Integrity OT Basic ADL's Functional Problem(s): Grooming;Bathing;Dressing;Toileting OT Transfers Functional Problem(s): Toilet;Tub/Shower OT Additional Impairment(s): None;Fuctional Use of Upper Extremity OT Plan OT Intensity: Minimum of 1-2 x/day, 45 to 90 minutes OT Frequency: 5 out of 7 days OT Duration/Estimated Length of Stay: 2 weeks OT Treatment/Interventions: Balance/vestibular training;Cognitive remediation/compensation;Community reintegration;Discharge planning;Functional mobility training;Psychosocial support;Therapeutic Activities;UE/LE Coordination activities;Patient/family education;DME/adaptive equipment instruction;UE/LE Strength taining/ROM;Wheelchair propulsion/positioning;Therapeutic Exercise;Self Care/advanced ADL retraining;Neuromuscular  re-education OT Self Feeding Anticipated Outcome(s): n/a OT Basic Self-Care Anticipated Outcome(s): supervision OT Toileting Anticipated Outcome(s): supervision OT Bathroom Transfers Anticipated Outcome(s): supervision OT Recommendation Recommendations for Other Services: Therapeutic Recreation consult Therapeutic Recreation Interventions: Pet therapy Patient destination: Home Follow Up Recommendations: Outpatient OT;24 hour supervision/assistance Equipment Recommended: To be determined   Skilled Therapeutic Intervention Upon entering the room, pt supine in bed resting with family member present in the room. Pt provided education to pt and caregiver regarding OT purpose, POC, and goals with them verbalizing understanding. Pt lethargic throughout session and having to be given cues for alertness. Pt ambulating to sink with min A and without use of AD. Pt bathing with sit <> stand from chair at sink with pt utilized mostly L hand secondary to pain with use of dominant R hand. Pt standing to wash buttocks and peri area with min A for standing balance with pt displaying posterior lean. Pt needing multiple rest breaks throughout session and returning to bed at end of session. OT discussed pt's level of participation with caregiver at end of session. Call bell and all needed items within reach upon exiting the room.  OT Evaluation Precautions/Restrictions  Precautions Precautions: Fall Required Braces or Orthoses: Other Brace/Splint Other Brace/Splint: Breast binder over R chest hematoma Restrictions Weight Bearing Restrictions: No General   Vital Signs Therapy Vitals Temp: 99.7 F (37.6 C) Temp Source: Oral Pulse Rate: (!) 53 (RN notified) Resp: 18 BP: (!) 164/53 (Rn notified) Patient Position (if appropriate): Lying Oxygen Therapy SpO2: 98 % O2 Device: Not Delivered Pain Pain Assessment Pain Assessment: No/denies pain Home Living/Prior Functioning Home Living Living  Arrangements: Spouse/significant other Available Help at Discharge: Family, Available 24 hours/day Type of Home: House Home Access: Level entry Home Layout: One level Bathroom Shower/Tub: Multimedia programmer: Handicapped height Bathroom Accessibility: Yes  Lives With: Spouse IADL History Homemaking  Responsibilities: No Current License: Yes Mode of Transportation: Car Occupation: Full time employment IADL Comments: 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 Prior Function Level of Independence: Independent with basic ADLs, Independent with homemaking with ambulation  Able to Take Stairs?: Yes Driving: Yes Vocation: Full time employment Comments: owns car dealership, 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 Vision Baseline Vision/History: Wears glasses Wears Glasses: At all times Patient Visual Report: No change from baseline Cognition Overall Cognitive Status: Impaired/Different from baseline Arousal/Alertness: Awake/alert Orientation Level: Person;Place;Situation Person: Oriented Place: Oriented Situation: Oriented Year: 2018 Month: October Day of Week: Correct Memory: Impaired Memory Impairment: Decreased recall of new information;Decreased short term memory Decreased Short Term Memory: Verbal basic;Functional basic Immediate Memory Recall: Sock;Blue;Bed Memory Recall: Sock (1/3) Memory Recall Sock: Without Cue Attention: Sustained Sustained Attention: Impaired Sustained Attention Impairment: Verbal basic;Functional basic Awareness: Impaired Awareness Impairment: Intellectual impairment Problem Solving: Impaired Problem Solving Impairment: Other (comment) (mildly complex level both verbal and functional) Executive Function:  (All areas impacted by memory and attention deficits) Safety/Judgment: Impaired Sensation Coordination Gross Motor Movements are Fluid and Coordinated: No Fine Motor Movements are Fluid and Coordinated: No Motor   Motor Motor: Other (comment) Motor - Skilled Clinical Observations: generalized weakness Mobility  Bed Mobility Bed Mobility: Sit to Supine;Supine to Sit Supine to Sit: 4: Min assist Supine to Sit Details: Verbal cues for technique;Tactile cues for weight shifting;Tactile cues for posture;Tactile cues for sequencing;Tactile cues for initiation Sit to Supine: 4: Min assist Sit to Supine - Details: Verbal cues for technique;Verbal cues for precautions/safety;Verbal cues for sequencing;Tactile cues for sequencing;Tactile cues for weight shifting Transfers Transfers: Sit to Stand;Stand to Sit Sit to Stand: 4: Min assist;With upper extremity assist Sit to Stand Details: Verbal cues for technique;Verbal cues for precautions/safety;Tactile cues for weight shifting;Tactile cues for posture Stand to Sit: 4: Min assist Stand to Sit Details (indicate cue type and reason): Verbal cues for technique;Verbal cues for precautions/safety  Trunk/Postural Assessment  Cervical Assessment Cervical Assessment: Within Functional Limits Thoracic Assessment Thoracic Assessment: Within Functional Limits Lumbar Assessment Lumbar Assessment: Within Functional Limits Postural Control Postural Control: Deficits on evaluation  Balance Balance Balance Assessed: Yes Standardized Balance Assessment Standardized Balance Assessment: Timed Up and Go Test;Berg Balance Test Berg Balance Test Sit to Stand: Able to stand  independently using hands Standing Unsupported: Able to stand 2 minutes with supervision Sitting with Back Unsupported but Feet Supported on Floor or Stool: Able to sit safely and securely 2 minutes Stand to Sit: Sits safely with minimal use of hands Transfers: Able to transfer with verbal cueing and /or supervision Standing Unsupported with Eyes Closed: Needs help to keep from falling Standing Ubsupported with Feet Together: Able to place feet together independently and stand for 1 minute with  supervision From Standing, Reach Forward with Outstretched Arm: Reaches forward but needs supervision From Standing Position, Pick up Object from Floor: Able to pick up shoe, needs supervision From Standing Position, Turn to Look Behind Over each Shoulder: Turn sideways only but maintains balance Turn 360 Degrees: Needs assistance while turning Standing Unsupported, Alternately Place Feet on Step/Stool: Able to complete >2 steps/needs minimal assist Standing Unsupported, One Foot in Front: Able to take small step independently and hold 30 seconds Standing on One Leg: Unable to try or needs assist to prevent fall Total Score: 28 Timed Up and Go Test TUG: Normal TUG Normal TUG (seconds): 18 Static Sitting Balance Static Sitting - Balance Support: Feet supported Static Sitting -   Level of Assistance: 6: Modified independent (Device/Increase time) Dynamic Sitting Balance Dynamic Sitting - Balance Support: Feet supported Dynamic Sitting - Level of Assistance: 5: Stand by assistance Dynamic Sitting - Balance Activities: Lateral lean/weight shifting;Forward lean/weight shifting Static Standing Balance Static Standing - Balance Support: During functional activity Static Standing - Level of Assistance: 5: Stand by assistance Dynamic Standing Balance Dynamic Standing - Balance Support: During functional activity;No upper extremity supported Dynamic Standing - Level of Assistance: 4: Min assist Dynamic Standing - Balance Activities: Lateral lean/weight shifting;Forward lean/weight shifting;Reaching for objects Extremity/Trunk Assessment RUE Assessment RUE Assessment: Not tested (secondary to pain) LUE Assessment LUE Assessment: Within Functional Limits   See Function Navigator for Current Functional Status.   Refer to Care Plan for Long Term Goals  Recommendations for other services: Therapeutic Recreation  Pet therapy   Discharge Criteria: Patient will be discharged from OT if patient  refuses treatment 3 consecutive times without medical reason, if treatment goals not met, if there is a change in medical status, if patient makes no progress towards goals or if patient is discharged from hospital.  The above assessment, treatment plan, treatment alternatives and goals were discussed and mutually agreed upon: by patient  Gypsy Decant 03/28/2017, 4:01 PM

## 2017-03-28 NOTE — Progress Notes (Signed)
Social Work  Social Work Assessment and Plan  Patient Details  Name: Bryan Carney MRN: [id number] Date of Birth: 11-09-1940  Today's Date: 03/28/2017  Problem List:  Patient Active Problem List   Diagnosis Date Noted  . Debility 03/27/2017  . Chest wall hematoma, right, sequela   . AKI (acute kidney injury) ([place])   . Leukocytosis   . Delirium   . Clot   . Hypoxia   . Lethargic   . Acute delirium   . Coronary artery disease involving native coronary artery of native heart without angina pectoris   . Prostate cancer ([place])   . Paroxysmal atrial fibrillation (HCC)   . Coagulopathy ([place])   . Acute blood loss anemia 03/20/2017  . Hematoma 03/20/2017  . Hyponatremia 03/20/2017  . Supratherapeutic INR 03/20/2017  . Essential hypertension 07/07/2014  . Hyperlipidemia 07/07/2014  . Coronary artery disease due to lipid rich plaque 07/07/2014  . Carotid artery disease ([place]) 07/07/2014  . Type 2 diabetes mellitus without complication, without long-term current use of insulin ([place]) 07/07/2014  . Long term (current) use of anticoagulants 10/14/2012  . Acute pulmonary embolism ([place]) 10/14/2012   Past Medical History:  Past Medical History:  Diagnosis Date  . CAD (coronary artery disease)   . Diabetes ([place])   . Hx pulmonary embolism   . Hyperlipidemia   . Hypertension   . Prostate cancer ([place])   . PVOD (pulmonary veno-occlusive disease) ([place])   . Squamous cell carcinoma    Past Surgical History:  Past Surgical History:  Procedure Laterality Date  . BACK SURGERY    . CARDIAC CATHETERIZATION  10/15/1997   Recommend complete revascularization by CABG  . CARDIOVASCULAR STRESS TEST  06/12/2012   No evidence of ischemia  . CAROTID DOPPLER  08/27/2012   Rt bulb/proximal ICA demonstrated a mild-moderate amount of fibrous plaque without evidence of a significant diameter reduction or any other vascular abnormality.  . CAROTID ENDARTERECTOMY Left 02/23/1995  . CORONARY ARTERY BYPASS GRAFT   10/19/1997   x5. LIMA-LAD, SVG-PDA, SVG-OM1, seq SVG-fist and second branches of ramus intermedius.   Social History:  reports that he has never smoked. He has never used smokeless tobacco. He reports that he does not drink alcohol. His drug history is not on file.  Family / Support Systems Marital Status: Married Patient Roles: Spouse, Parent, Other (Comment) (business owner) Spouse/Significant Other: wife, Bryan Carney @ (H) [phone] or [phone] Children: daughter, Bryan Carney, [place]) @ [phone] Other Supports: extended family Anticipated Caregiver: wife Ability/Limitations of Caregiver: no limits Caregiver Availability: 24/7 Family Dynamics: Pt describes wife and daughter as extremely supportive and able to provide any assist needed.  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   Abuse/Neglect Physical Abuse: Denies Verbal Abuse: Denies Sexual Abuse: Denies Exploitation of patient/patient's resources: Denies Self-Neglect: Denies  Emotional Status Pt's affect, behavior adn adjustment status: Pt very pleasant, talkative and slightly HOH.  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  He denies any s/s any any emotional distress - will monitor and refer for neuropsychology as indicated. Recent Psychosocial Issues: None Pyschiatric History: None Substance Abuse History: None  Patient / Family Perceptions, Expectations &  Goals Pt/Family understanding of illness & functional limitations: Pt with good, basic understanding of the large chest wall hematoma, significant drop in Hgb and current state of  debility/ need for CIR. Premorbid pt/family roles/activities: Pt working f/t 6 days per week with his business.  Completely independent Anticipated changes in roles/activities/participation: dependent on goals but anticipate wife will assume caregiver responsibilities. Pt/family expectations/goals: "I need to get my strength back."  [employer]: None Premorbid Home Care/DME Agencies: None Transportation available at discharge: yes  Discharge Planning Living Arrangements: Spouse/significant other [place]: Spouse/significant other, Children, Other relatives, Water engineer, Social worker community Type of Residence: Private residence Insurance Resources: [employer] Museum/gallery curator Resources: Employment, Radio broadcast assistant Screen Referred: No Living Expenses: Own Money Management: Patient Does the patient have any problems obtaining your medications?: No Home Management: pt and wife Patient/Family Preliminary Plans: Pt plans to return home with his wife as primary support. Social Work Anticipated Follow Up Needs: HH/OP Expected length of stay: 2 weeks  Clinical Impression Very pleasant, oriented, elderly gentleman here following development of significant chest wall hematoma and now on CIR for debility.  Excellent support from wife, daughter, extended family and community.  Pt denies any significant emotional distress.  Will follow along for support and d/c planning needs.  Bryan Carney 03/28/2017, 2:14 PM

## 2017-03-28 NOTE — Evaluation (Signed)
Physical Therapy Assessment and Plan  Patient Details  Name: Bryan Carney MRN: [id number] Date of Birth: May 07, 1941  PT Diagnosis: Abnormality of gait, Cognitive deficits, Difficulty walking, Edema, Impaired cognition and Muscle weakness Rehab Potential: Good ELOS: 2 weeks   Today's Date: 03/28/2017 PT Individual Time: 0900-1000 PT Individual Time Calculation (min): 60 min    Problem List:  Patient Active Problem List   Diagnosis Date Noted  . Debility 03/27/2017  . Chest wall hematoma, right, sequela   . AKI (acute kidney injury) ([place])   . Leukocytosis   . Delirium   . Clot   . Hypoxia   . Lethargic   . Acute delirium   . Coronary artery disease involving native coronary artery of native heart without angina pectoris   . Prostate cancer ([place])   . Paroxysmal atrial fibrillation ([facility])   . Coagulopathy ([place])   . Acute blood loss anemia 03/20/2017  . Hematoma 03/20/2017  . Hyponatremia 03/20/2017  . Supratherapeutic INR 03/20/2017  . Essential hypertension 07/07/2014  . Hyperlipidemia 07/07/2014  . Coronary artery disease due to lipid rich plaque 07/07/2014  . Carotid artery disease ([place]) 07/07/2014  . Type 2 diabetes mellitus without complication, without long-term current use of insulin ([place]) 07/07/2014  . Long term (current) use of anticoagulants 10/14/2012  . Acute pulmonary embolism ([place]) 10/14/2012    Past Medical History:  Past Medical History:  Diagnosis Date  . CAD (coronary artery disease)   . Diabetes ([place])   . Hx pulmonary embolism   . Hyperlipidemia   . Hypertension   . Prostate cancer ([place])   . PVOD (pulmonary veno-occlusive disease) ([place])   . Squamous cell carcinoma    Past Surgical History:  Past Surgical History:  Procedure Laterality Date  . BACK SURGERY    . CARDIAC CATHETERIZATION  10/15/1997   Recommend complete revascularization by CABG  . CARDIOVASCULAR STRESS TEST  06/12/2012   No evidence of ischemia  . CAROTID DOPPLER  08/27/2012   Rt  bulb/proximal ICA demonstrated a mild-moderate amount of fibrous plaque without evidence of a significant diameter reduction or any other vascular abnormality.  . CAROTID ENDARTERECTOMY Left 02/23/1995  . CORONARY ARTERY BYPASS GRAFT  10/19/1997   x5. LIMA-LAD, SVG-PDA, SVG-OM1, seq SVG-fist and second branches of ramus intermedius.    Assessment & Plan Clinical Impression: Bryan Carney a 76 y.o.malewith history of CAD/PVD, PE-on chronic coumadin, T2DM, prostate cancer, who was admitted on 03/20/17 with syncope, significant enlargement of right chest hematoma and weakness. History taken from chart review, patient, and family. Coumadin reversed with FFP and Vitamin K and he was transfused with 2 units PRBC for Hgb of 6.4. CT chest without signs of extravasation and not need for angiogram per IVR. No surgical intervention needed per IR. Bryan Carney and he was started on IV Cardizem to help manage tachycardia. Hospital course significant for A fib with RVR, diffuse weakness with tremors, difficulty following commands fluctuating bouts of lethargy, labile BP as well as acute kidney injury. Repeat head CT 10/1 wasunremarkable for acute process and delirium resolving. He has had edema RUE and dopplers positive for acute superficial thrombosis in cephalic vein at level of antecubital fossa. As H/H has been relatively stable, he was cleared to start Eliquis on 10/2 with recommendations to follow CBC closely and resume ASA in the future.  On later evaluation, patient noted to be lethargic with daughter and wife reporting decline in cognition since this morning.  His oxygen was d/ced this morning  as well and O2 Sats noted to be around 92 on eval. He continues to have deficits in mobility and ability to carry out ADL tasks. CIR recommended for follow up therapy.  Patient transferred to CIR on 03/27/2017 .   Patient currently requires min with mobility secondary to muscle weakness, decreased cardiorespiratoy  endurance, decreased problem solving, decreased safety awareness, decreased memory and delayed processing and decreased standing balance, decreased postural control and decreased balance strategies.  Prior to hospitalization, patient was independent  with mobility and lived with Spouse in a House home.  Home access is  Level entry.  Patient will benefit from skilled PT intervention to maximize safe functional mobility, minimize fall risk and decrease caregiver burden for planned discharge home with 24 hour supervision.  Anticipate patient will benefit from follow up OP at discharge.  PT - End of Session Activity Tolerance: Tolerates 30+ min activity with multiple rests Endurance Deficit: Yes Endurance Deficit Description: requires seated rest breaks with short duration mobility activities and lethargic this session.  PT Assessment Rehab Potential (ACUTE/IP ONLY): Good PT Patient demonstrates impairments in the following area(s): Balance;Endurance;Motor;Safety;Perception PT Transfers Functional Problem(s): Bed Mobility;Bed to Chair;Car;Furniture PT Locomotion Functional Problem(s): Ambulation;Stairs PT Plan PT Intensity: Minimum of 1-2 x/day ,45 to 90 minutes PT Frequency: 5 out of 7 days PT Duration Estimated Length of Stay: 2 weeks PT Treatment/Interventions: Ambulation/gait training;Community reintegration;DME/adaptive equipment instruction;Neuromuscular re-education;Psychosocial support;Stair training;UE/LE Strength taining/ROM;UE/LE Coordination activities;Therapeutic Activities;Pain management;Discharge planning;Balance/vestibular training;Cognitive remediation/compensation;Disease management/prevention;Functional mobility training;Patient/family education;Therapeutic Exercise;Visual/perceptual remediation/compensation PT Transfers Anticipated Outcome(s): Supervision PT Locomotion Anticipated Outcome(s): Supervision PT Recommendation Recommendations for Other Services: Neuropsych  consult;Therapeutic Recreation consult Therapeutic Recreation Interventions: Pet therapy;Kitchen group;Outing/community reintergration Follow Up Recommendations: Outpatient PT;24 hour supervision/assistance Patient destination: Home Equipment Recommended: To be determined Equipment Details: likely none needed  Skilled Therapeutic Intervention Pt received seated in recliner with pt's wife's cousin present to observe. Denies pain and agreeable to treatment. Assessed all mobility as described below with minA overall d/t unsteadiness, rest breaks as needed d/t fatigue. Educated pt and family member on risk of falls based on balance outcome measures, and reinforced need for pt to have staff present before transferring or ambulating. Educated pt in rehab process, goals, estimated LOS to be determined after all evaluations completed. Remained supine in bed at end of session, alarm intact and all needs in reach.   PT Evaluation Precautions/Restrictions Precautions Precautions: Fall Required Braces or Orthoses: Other Brace/Splint Other Brace/Splint: Breast binder over R chest hematoma Restrictions Weight Bearing Restrictions: No General Chart Reviewed: Yes Response to Previous Treatment: Not applicable Family/Caregiver Present: Yes (pt's wife's cousin)   Vital SignsTherapy Vitals Temp: 98.3 F (36.8 C) Temp Source: Oral Pulse Rate: 74 Resp: 18 BP: (!) 155/61 Patient Position (if appropriate): Lying Oxygen Therapy SpO2: 93 % O2 Device: Not Delivered Pain Pain Assessment Pain Score: 0-No pain Home Living/Prior Functioning Home Living Available Help at Discharge: Family;Available 24 hours/day Type of Home: House  Lives With: Spouse Prior Function Vocation: Full time employment Vision/Perception   WFL  Cognition Overall Cognitive Status: Impaired/Different from baseline Arousal/Alertness: Awake/alert Orientation Level: Oriented X4 Attention: Sustained Sustained Attention:  Impaired Sustained Attention Impairment: Verbal basic;Functional basic Memory: Impaired Memory Impairment: Decreased recall of new information;Decreased short term memory Decreased Short Term Memory: Verbal basic;Functional basic Awareness: Impaired Awareness Impairment: Intellectual impairment Problem Solving: Impaired Problem Solving Impairment: Other (comment) (mildly complex level both verbal and functional) Executive Function:  (All areas impacted by memory and attention deficits) Safety/Judgment: Impaired Camera operator  Movements are Fluid and Coordinated: No Fine Motor Movements are Fluid and Coordinated: No Motor  Motor Motor: Other (comment) Motor - Skilled Clinical Observations: generalized weakness  Mobility Bed Mobility Bed Mobility: Sit to Supine;Supine to Sit Supine to Sit: 4: Min assist Supine to Sit Details: Verbal cues for technique;Tactile cues for weight shifting;Tactile cues for posture;Tactile cues for sequencing;Tactile cues for initiation Sit to Supine: 4: Min assist Sit to Supine - Details: Verbal cues for technique;Verbal cues for precautions/safety;Verbal cues for sequencing;Tactile cues for sequencing;Tactile cues for weight shifting Transfers Transfers: Yes Sit to Stand: 4: Min assist;With upper extremity assist Sit to Stand Details: Verbal cues for technique;Verbal cues for precautions/safety;Tactile cues for weight shifting;Tactile cues for posture Stand to Sit: 4: Min assist Stand to Sit Details (indicate cue type and reason): Verbal cues for technique;Verbal cues for precautions/safety Stand Pivot Transfers: 4: Min assist Stand Pivot Transfer Details: Verbal cues for technique;Verbal cues for precautions/safety Locomotion  Ambulation Ambulation Distance (Feet): 150 Feet Gait Gait: Yes Gait Pattern: Impaired Gait Pattern: Shuffle;Poor foot clearance - left;Poor foot clearance - right;Narrow base of support Stairs / Additional  Locomotion Stairs: Yes Stairs Assistance: 4: Min assist Stair Management Technique: Two rails;Alternating pattern;Forwards Number of Stairs: 8 Height of Stairs: 3 Ramp: 4: Min assist Curb: 4: Min Administrator Mobility: No  Trunk/Postural Assessment  Cervical Assessment Cervical Assessment: Within Functional Limits Thoracic Assessment Thoracic Assessment: Within Functional Limits Lumbar Assessment Lumbar Assessment: Within Functional Limits Postural Control Postural Control: Deficits on evaluation (delayed/inefficient stepping and righting reactions)  Balance Balance Balance Assessed: Yes Standardized Balance Assessment Standardized Balance Assessment: Timed Up and Go Test;Berg Balance Test Berg Balance Test Sit to Stand: Able to stand  independently using hands Standing Unsupported: Able to stand 2 minutes with supervision Sitting with Back Unsupported but Feet Supported on Floor or Stool: Able to sit safely and securely 2 minutes Stand to Sit: Sits safely with minimal use of hands Transfers: Able to transfer with verbal cueing and /or supervision Standing Unsupported with Eyes Closed: Needs help to keep from falling Standing Ubsupported with Feet Together: Able to place feet together independently and stand for 1 minute with supervision From Standing, Reach Forward with Outstretched Arm: Reaches forward but needs supervision From Standing Position, Pick up Object from Floor: Able to pick up shoe, needs supervision From Standing Position, Turn to Look Behind Over each Shoulder: Turn sideways only but maintains balance Turn 360 Degrees: Needs assistance while turning Standing Unsupported, Alternately Place Feet on Step/Stool: Able to complete >2 steps/needs minimal assist Standing Unsupported, One Foot in Front: Able to take small step independently and hold 30 seconds Standing on One Leg: Unable to try or needs assist to prevent fall Total Score:  28 Timed Up and Go Test TUG: Normal TUG Normal TUG (seconds): 18 Static Sitting Balance Static Sitting - Balance Support: Feet supported Static Sitting - Level of Assistance: 6: Modified independent (Device/Increase time) Dynamic Sitting Balance Dynamic Sitting - Balance Support: Feet supported Dynamic Sitting - Level of Assistance: 5: Stand by assistance Dynamic Sitting - Balance Activities: Lateral lean/weight shifting;Forward lean/weight shifting Static Standing Balance Static Standing - Balance Support: During functional activity Static Standing - Level of Assistance: 5: Stand by assistance Dynamic Standing Balance Dynamic Standing - Balance Support: During functional activity;No upper extremity supported Dynamic Standing - Level of Assistance: 4: Min assist Dynamic Standing - Balance Activities: Lateral lean/weight shifting;Forward lean/weight shifting;Reaching for objects Extremity Assessment  RUE Assessment RUE Assessment: Not tested (  secondary to pain) LUE Assessment LUE Assessment: Within Functional Limits RLE Assessment RLE Assessment: Exceptions to [facility] (4+/5 throughout) LLE Assessment LLE Assessment: Exceptions to [facility] (4+/5 throughout)   See Function Navigator for Current Functional Status.   Refer to Care Plan for Long Term Goals  Recommendations for other services: Neuropsych and Therapeutic Recreation  Pet therapy, Kitchen group and Outing/community reintegration  Discharge Criteria: Patient will be discharged from PT if patient refuses treatment 3 consecutive times without medical reason, if treatment goals not met, if there is a change in medical status, if patient makes no progress towards goals or if patient is discharged from hospital.  The above assessment, treatment plan, treatment alternatives and goals were discussed and mutually agreed upon: by patient and by family  Luberta Mutter 03/28/2017, 7:44 AM

## 2017-03-28 NOTE — Progress Notes (Signed)
Patient information reviewed and entered into eRehab system by [name], RN, CRRN, PPS Coordinator.  Information including medical coding and functional independence measure will be reviewed and updated through discharge.     Per nursing patient was given "Data Collection Information Summary for Patients in Inpatient Rehabilitation Facilities with attached "Privacy Act Statement-Health Care Records" upon admission.  

## 2017-03-28 NOTE — Progress Notes (Addendum)
[facility] PHYSICAL MEDICINE & REHABILITATION     PROGRESS NOTE    Subjective/Complaints: Had an "off and on" night. Appears fairly rested this morning. Going to bathroom with tech when I entered  ROS: pt denies nausea, vomiting, diarrhea, cough, shortness of breath or chest pain   Objective: Vital Signs: Blood pressure (!) 155/61, pulse 74, temperature 98.3 F (36.8 C), temperature source Oral, resp. rate 18, SpO2 93 %. Dg Chest 2 View  Result Date: 03/27/2017 CLINICAL DATA:  Atelectasis. EXAM: CHEST  2 VIEW COMPARISON:  Chest x-ray dated March 23, 2017. FINDINGS: Postsurgical changes related to prior CABG. Mild cardiomegaly. Normal pulmonary vascularity. Low lung volumes. Patchy opacities in the right lower lobe. Trace pleural effusions bilaterally. No pneumothorax or consolidation. No acute osseous abnormality. IMPRESSION: 1. Patchy opacities in the right lower lobe which may represent atelectasis or pneumonia. 2. Mild cardiomegaly with trace bilateral pleural effusions. Electronically Signed   By: Titus Dubin M.D.   On: 03/27/2017 20:30    Recent Labs  03/27/17 0248 03/28/17 0533  WBC 10.0 10.3  HGB 8.7* 8.4*  HCT 25.5* 24.6*  PLT 304 356    Recent Labs  03/26/17 0148 03/28/17 0533  NA 131* 133*  K 3.5 3.7  CL 101 99*  GLUCOSE 159* 204*  BUN 25* 32*  CREATININE 1.32* 1.24  CALCIUM 7.7* 8.1*   CBG (last 3)   Recent Labs  03/27/17 1609 03/27/17 2136 03/28/17 0633  GLUCAP 188* 240* 197*    Wt Readings from Last 3 Encounters:  03/23/17 64.9 kg (143 lb 1.3 oz)  03/18/17 55.3 kg (122 lb)  05/09/16 59.9 kg (132 lb)    Physical Exam:  Constitutional: He is oriented to person, place, and time. He appears well-developed and well-nourished. He appears listless. He is easily aroused. No distress.  HENT:  Head: Normocephalic and atraumatic.  Eyes: Pupils are equal, round, and reactive to light. Conjunctivae and EOM are normal.  Neck: Normal range of  motion. Neck supple.  Cardiovascular: RRR without murmur. No JVD .   Respiratory: CTA Bilaterally without wheezes or rales. Normal effort--did not auscultate rales on left   GI: Soft. Bowel sounds are normal. He exhibits no distension. There is no tenderness.  Musculoskeletal: He exhibits no tenderness.  Min edema right biceps with resolving ecchymosis and hematoma. --stable  Neurological: alert. HOH.  Oriented to self, hospital. Follows all commands Motor: 4-/5 prox to distal in all 4 limbs. Senses pain in all 4's.  Skin: Skin is warm and dry. He is not diaphoretic.  Right chest wall hematoma with decrease in size and surrounding changes.   Psychiatric: His affect is flat Assessment/Plan: 1. Functional deficits secondary to debility/delirium which require 3+ hours per day of interdisciplinary therapy in a comprehensive inpatient rehab setting. Physiatrist is providing close team supervision and 24 hour management of active medical problems listed below. Physiatrist and rehab team continue to assess barriers to discharge/monitor patient progress toward functional and medical goals.  Function:  Bathing Bathing position      Bathing parts      Bathing assist        Upper Body Dressing/Undressing Upper body dressing                    Upper body assist        Lower Body Dressing/Undressing Lower body dressing  Lower body assist        Toileting Toileting   Toileting steps completed by patient: Adjust clothing prior to toileting, Performs perineal hygiene, Adjust clothing after toileting   Toileting Assistive Devices: Grab bar or rail  Toileting assist Assist level: Supervision or verbal cues   Transfers Chair/bed transfer             Locomotion Ambulation           Wheelchair          Cognition Comprehension Comprehension assist level: Understands basic 75 - 89% of the time/ requires cueing 10 - 24% of  the time  Expression Expression assist level: Expresses basic needs/ideas: With extra time/assistive device  Social Interaction Social Interaction assist level: Interacts appropriately 90% of the time - Needs monitoring or encouragement for participation or interaction.  Problem Solving Problem solving assist level: Solves basic 75 - 89% of the time/requires cueing 10 - 24% of the time  Memory Memory assist level: Recognizes or recalls 90% of the time/requires cueing < 10% of the time  Medical Problem List and Plan: 1.  Limitations with mobility, weakness, and ability to complete ADLs secondary to debility and delirium.  -begin CIR therapies 2.  PE/Superfical DVT RUE cephalic vein/anticoagulation:  -Eliquis 3. Pain Management: tylenol prn 4. Mood: LCSW to follow for evaluation and support.  5. Neuropsych: This patient is not fully capable of making decisions on his own behalf. 6. Skin/Wound Care: Pressure relief measures.  7. Fluids/Electrolytes/Nutrition: Monitor I/O.   -BUN sl elevated. Push po 8. Right chest hematoma with ABLA: H/H stable. Will order CBC every 3 days to monitor for stability as anticoagulation resumed.  9. CAD/PAF: RVR has resolved. On Benazepril, diltiazem and atorvastatin. Hold ASA for now.   monitor HR bid 10.HTN: Monitor BP bid. Continues to be labile will monitor bid and titrate medications as indicated.  11. Acute kidney injury: SCr improved from 1.52--> 1.32. Continue to encourage fluid intake and avoid nephrotoxic mediations.  12. Leukocytosis: WBC trending back up with low grade fever. Encourage IS with flutter valve to help with atelectasis and monitor for signs of infection.  -cxr with LLL infiltrate---?aspiration event---begin zosyn today 13. T2DM: Was on Kombiglyze 10/998 daily. No metformin due to AKI--question CKD?    -started low dose amaryl and Trajenta.   .  Continue SSI for elevated BS.  -continue low dose lantus for now  14. Hyponatremia: sodium holding  at 131.    15. Delirium: Much improved but not fully cleared. Will check CXR to follow up.  16. Lethargy: likely fatigue/stamina related  LOS (Days) 1 A FACE TO [place] T, MD 03/28/2017 9:26 AM

## 2017-03-29 ENCOUNTER — Inpatient Hospital Stay (HOSPITAL_COMMUNITY): Payer: Medicare Other | Admitting: Physical Therapy

## 2017-03-29 ENCOUNTER — Inpatient Hospital Stay (HOSPITAL_COMMUNITY): Payer: Medicare Other | Admitting: Occupational Therapy

## 2017-03-29 ENCOUNTER — Inpatient Hospital Stay (HOSPITAL_COMMUNITY): Payer: Medicare Other | Admitting: Speech Pathology

## 2017-03-29 ENCOUNTER — Inpatient Hospital Stay (HOSPITAL_COMMUNITY): Payer: Medicare Other

## 2017-03-29 DIAGNOSIS — J690 Pneumonitis due to inhalation of food and vomit: Secondary | ICD-10-CM

## 2017-03-29 LAB — GLUCOSE, CAPILLARY
GLUCOSE-CAPILLARY: 255 mg/dL — AB (ref 65–99)
Glucose-Capillary: 126 mg/dL — ABNORMAL HIGH (ref 65–99)
Glucose-Capillary: 144 mg/dL — ABNORMAL HIGH (ref 65–99)
Glucose-Capillary: 111 mg/dL — ABNORMAL HIGH (ref 65–99)

## 2017-03-29 LAB — CBC
HCT: 27.4 % — ABNORMAL LOW (ref 39.0–52.0)
Hemoglobin: 8.9 g/dL — ABNORMAL LOW (ref 13.0–17.0)
MCH: 30.2 pg (ref 26.0–34.0)
MCHC: 32.5 g/dL (ref 30.0–36.0)
MCV: 92.9 fL (ref 78.0–100.0)
PLATELETS: 429 10*3/uL — AB (ref 150–400)
RBC: 2.95 MIL/uL — ABNORMAL LOW (ref 4.22–5.81)
RDW: 13.8 % (ref 11.5–15.5)
WBC: 11.2 10*3/uL — ABNORMAL HIGH (ref 4.0–10.5)

## 2017-03-29 MED ORDER — LINAGLIPTIN 5 MG PO TABS
5.0000 mg | ORAL_TABLET | Freq: Every day | ORAL | Status: DC
  Administered 2017-03-29 – 2017-04-05 (×8): 5 mg via ORAL
  Filled 2017-03-29 (×8): qty 1

## 2017-03-29 MED ORDER — GLIMEPIRIDE 2 MG PO TABS
2.0000 mg | ORAL_TABLET | Freq: Every day | ORAL | Status: DC
  Administered 2017-03-30 – 2017-04-02 (×4): 2 mg via ORAL
  Filled 2017-03-29 (×4): qty 1

## 2017-03-29 NOTE — Progress Notes (Signed)
[place] PHYSICAL MEDICINE & REHABILITATION     PROGRESS NOTE    Subjective/Complaints: No new issues. Sitting eating breakfast. Denies cough. Still has low grade temp.   ROS: pt denies nausea, vomiting, diarrhea, cough, shortness of breath or chest pain   Objective: Vital Signs: Blood pressure (!) 149/58, pulse 71, temperature 99 F (37.2 C), temperature source Oral, resp. rate 16, weight 65.3 kg (143 lb 15.8 oz), SpO2 98 %. Dg Chest 2 View  Result Date: 03/27/2017 CLINICAL DATA:  Atelectasis. EXAM: CHEST  2 VIEW COMPARISON:  Chest x-ray dated March 23, 2017. FINDINGS: Postsurgical changes related to prior CABG. Mild cardiomegaly. Normal pulmonary vascularity. Low lung volumes. Patchy opacities in the right lower lobe. Trace pleural effusions bilaterally. No pneumothorax or consolidation. No acute osseous abnormality. IMPRESSION: 1. Patchy opacities in the right lower lobe which may represent atelectasis or pneumonia. 2. Mild cardiomegaly with trace bilateral pleural effusions. Electronically Signed   By: Titus Dubin M.D.   On: 03/27/2017 20:30    Recent Labs  03/28/17 0533 03/29/17 0447  WBC 10.3 11.2*  HGB 8.4* 8.9*  HCT 24.6* 27.4*  PLT 356 429*    Recent Labs  03/28/17 0533  NA 133*  K 3.7  CL 99*  GLUCOSE 204*  BUN 32*  CREATININE 1.24  CALCIUM 8.1*   CBG (last 3)   Recent Labs  03/28/17 1634 03/28/17 2107 03/29/17 0627  GLUCAP 167* 96 126*    Wt Readings from Last 3 Encounters:  03/29/17 65.3 kg (143 lb 15.8 oz)  03/23/17 64.9 kg (143 lb 1.3 oz)  03/18/17 55.3 kg (122 lb)    Physical Exam:  Constitutional: He is oriented to person, place, and time. He appears well-developed and well-nourished. He appears listless. He is easily aroused. No distress.  HENT:  Head: Normocephalic and atraumatic.  Eyes: Pupils are equal, round, and reactive to light. Conjunctivae and EOM are normal.  Neck: Normal range of motion. Neck supple.  Cardiovascular:  RRR without murmur. No JVD .   Respiratory: normal effort. No discernable rales on exam GI: BS +, non-tender, non-distended .  Musculoskeletal: He exhibits no tenderness.  Min edema right biceps with resolving ecchymosis and hematoma. --stable  Neurological: alert. HOH.  Oriented to self, hospital. Follows all commands Motor: 4-/5 prox to distal in all 4 limbs. Senses pain in all 4's.  Skin: Skin is warm and dry. He is not diaphoretic.  Right chest wall hematoma decreasing.   Psychiatric: His affect is flat  Assessment/Plan: 1. Functional deficits secondary to debility/delirium which require 3+ hours per day of interdisciplinary therapy in a comprehensive inpatient rehab setting. Physiatrist is providing close team supervision and 24 hour management of active medical problems listed below. Physiatrist and rehab team continue to assess barriers to discharge/monitor patient progress toward functional and medical goals.  Function:  Bathing Bathing position   Position: Wheelchair/chair at sink  Bathing parts Body parts bathed by patient: Right arm, Left arm, Chest, Abdomen, Front perineal area, Buttocks, Right upper leg, Left upper leg Body parts bathed by helper: Right lower leg, Left lower leg, Back  Bathing assist Assist Level: Touching or steadying assistance(Pt > 75%)      Upper Body Dressing/Undressing Upper body dressing   What is the patient wearing?: Button up shirt         Button up shirt - Perfomed by patient: Thread/unthread left sleeve Button up shirt - Perfomed by helper: Thread/unthread right sleeve, Pull shirt around back, Button/unbutton shirt  Upper body assist Assist Level:  (max a)      Lower Body Dressing/Undressing Lower body dressing   What is the patient wearing?: Underwear, Pants, Non-skid slipper socks Underwear - Performed by patient: Thread/unthread left underwear leg, Pull underwear up/down Underwear - Performed by helper: Thread/unthread right  underwear leg Pants- Performed by patient: Thread/unthread right pants leg, Thread/unthread left pants leg, Pull pants up/down     Non-skid slipper socks- Performed by helper: Don/doff right sock, Don/doff left sock                  Lower body assist Assist for lower body dressing:  (mod a)      Toileting Toileting   Toileting steps completed by patient: Adjust clothing prior to toileting, Performs perineal hygiene Toileting steps completed by helper: Adjust clothing after toileting Toileting Assistive Devices: Grab bar or rail  Toileting assist Assist level:  (mod a)   Transfers Chair/bed transfer   Chair/bed transfer method: Stand pivot Chair/bed transfer assist level: Touching or steadying assistance (Pt > 75%) Chair/bed transfer assistive device: Armrests, Bedrails     Locomotion Ambulation     Max distance: 150 Assist level: Touching or steadying assistance (Pt > 75%)   Wheelchair          Cognition Comprehension Comprehension assist level: Understands basic 75 - 89% of the time/ requires cueing 10 - 24% of the time  Expression Expression assist level: Expresses basic needs/ideas: With extra time/assistive device  Social Interaction Social Interaction assist level: Interacts appropriately 75 - 89% of the time - Needs redirection for appropriate language or to initiate interaction., Interacts appropriately 90% of the time - Needs monitoring or encouragement for participation or interaction.  Problem Solving Problem solving assist level: Solves basic 75 - 89% of the time/requires cueing 10 - 24% of the time  Memory Memory assist level: Recognizes or recalls 25 - 49% of the time/requires cueing 50 - 75% of the time  Medical Problem List and Plan: 1.  Limitations with mobility, weakness, and ability to complete ADLs secondary to debility and delirium.  -begin CIR therapies 2.  PE/Superfical DVT RUE cephalic vein/anticoagulation:  -Eliquis 3. Pain Management: tylenol  prn 4. Mood: LCSW to follow for evaluation and support.  5. Neuropsych: This patient is not fully capable of making decisions on his own behalf. 6. Skin/Wound Care: Pressure relief measures.  7. Fluids/Electrolytes/Nutrition: Monitor I/O.   -BUN sl elevated. Pushing po  -recheck bmet saturday 8. Right chest hematoma with ABLA: H/H stable.  CBC q 3 days to monitor for stability as anticoagulation resumed.  9. CAD/PAF: RVR has resolved. On Benazepril, diltiazem and atorvastatin. Hold ASA for now.   monitor HR bid 10.HTN: Monitor BP bid. Continues to be labile will monitor bid and titrate medications as indicated.  11. Acute kidney injury: SCr improved from 1.52--> 1.32. Continue to encourage fluid intake and avoid nephrotoxic mediations.  12. Leukocytosis: wbc 11.2 today (still trending up). Encourage IS with flutter valve -likely aspiration pneumonia   -continue zosyn   -recheck CXR on Monday  -recheck cbc Saturday 13. T2DM: Was on Kombiglyze 10/998 daily. No metformin due to AKI--question CKD?    -continue low dose a Trajenta.   -increase amaryl to 2mg  daily  .  Continue SSI for elevated BS.  -continue low dose lantus for now  14. Hyponatremia: sodium holding at 131.    15. Delirium: Much improved but not fully cleared.  .  16. Lethargy: likely fatigue/stamina related  LOS (Days) 2 A [place] T, MD 03/29/2017 8:58 AM

## 2017-03-29 NOTE — Progress Notes (Signed)
Occupational Therapy Session Note  Patient Details  Name: Bryan Carney MRN: [id number] Date of Birth: 28-Mar-1941  Today's Date: 03/29/2017 OT Individual Time: 0947-0962 OT Individual Time Calculation (min): 57 min    Short Term Goals: Week 1:  OT Short Term Goal 1 (Week 1): Pt will engaged in 10 minutes of functional activity with less than 2 rest breaks. OT Short Term Goal 2 (Week 1): Pt will perform toilet transfer with min A.  OT Short Term Goal 3 (Week 1): Pt will perform LB dressing with min A.  OT Short Term Goal 4 (Week 1): Pt will perform bathing from shower level with min A.   Skilled Therapeutic Interventions/Progress Updates:    1;1. granddaughter present throughout session. No c/o pain. Pt supine>sitting EOB with MIN A for trunk elevation and VC for sequencing transition. Pt dons shoes with supervision and stand pivot transfer with touching A and VC for foot placement before sit to stand. Pt stands to play mini golf with touching A and VC for BOS and weight shifting onto LLE. Pt propels w/c to ADL apartment with VC for steering around object to improve general conditioning required for BADLs. Pt practices shower transfer into walk in shower to mimic home environment with MIN A and VC for safety awareness. Pt ambulates throughout kitchen obtaining designated items on various shelves to simulate simple meal prep with VC for scanning. Pt ambulates back to room with 1 rest break and MIN A for balance with VC for looking forward, longer strides and clearing R foot. Exited session with pt seated in bed with family present and bed exit alarm on.   Therapy Documentation Precautions:  Precautions Precautions: Fall Required Braces or Orthoses: Other Brace/Splint Other Brace/Splint: Breast binder over R chest hematoma Restrictions Weight Bearing Restrictions: No  See Function Navigator for Current Functional Status.   Therapy/Group: Individual Therapy  Tonny Branch 03/29/2017, 4:10 PM

## 2017-03-29 NOTE — Progress Notes (Signed)
Occupational Therapy Session Note  Patient Details  Name: Bryan Carney MRN: [id number] Date of Birth: 06-22-1941  Today's Date: 03/29/2017 OT Individual Time: 1115-1155 OT Individual Time Calculation (min): 40 min    Short Term Goals: Week 1:  OT Short Term Goal 1 (Week 1): Pt will engaged in 10 minutes of functional activity with less than 2 rest breaks. OT Short Term Goal 2 (Week 1): Pt will perform toilet transfer with min A.  OT Short Term Goal 3 (Week 1): Pt will perform LB dressing with min A.  OT Short Term Goal 4 (Week 1): Pt will perform bathing from shower level with min A.   Skilled Therapeutic Interventions/Progress Updates:    Pt received in bed, stating he was very tired.  Pt did agree to working on some exercises. Paced the exercises so he would work for short periods.  He worked on general AROM UE/ LE, standing balance, stepping exercises. Min assist with standing exercises to stabilize balance.  He had great difficult with marching in place, lateral stepping.  Along with decreased strength, he seems to have difficulty with LE coordination.  Pt transferred to w/c at end of session and set up with chair alarm, quick release belt and call light to prepare for lunch.  Therapy Documentation Precautions:  Precautions Precautions: Fall Required Braces or Orthoses: Other Brace/Splint Other Brace/Splint: Breast binder over R chest hematoma Restrictions Weight Bearing Restrictions: No General PT Missed Treatment Reason: Other (Comment) (returned to room for EKG per PA request) Pain:  Pain Assessment Pain Assessment: No/denies pain ADL:   See Function Navigator for Current Functional Status.   Therapy/Group: Individual Therapy  SAGUIER,JULIA 03/29/2017, 12:59 PM

## 2017-03-29 NOTE — IPOC Note (Signed)
Overall Plan of Care [facility]) Patient Details Name: Bryan Carney MRN: [id number] DOB: Sep 19, 1940  Admitting Diagnosis: Aspiration pneumonia of left lower lobe due to gastric secretions [facility])  Hospital Problems: Principal Problem:   Aspiration pneumonia of left lower lobe due to gastric secretions [facility]) Active Problems:   Debility     Functional Problem List: Nursing Bowel  PT Balance, Endurance, Motor, Safety, Perception  OT Balance, Cognition, Edema, Endurance, Motor, Pain, Safety, Skin Integrity  SLP Cognition  TR         Basic ADL's: OT Grooming, Bathing, Dressing, Toileting     Advanced  ADL's: OT       Transfers: PT Bed Mobility, Bed to Chair, Car, Manufacturing systems engineer, Metallurgist: PT Ambulation, Stairs     Additional Impairments: OT None, Fuctional Use of Upper Extremity  SLP Social Cognition   Problem Solving, Memory, Attention  TR      Anticipated Outcomes Item Anticipated Outcome  Self Feeding n/a  Swallowing      Basic self-care  supervision  Toileting  supervision   Bathroom Transfers supervision  Bowel/Bladder  manage bowel and bladder with mod I assist  Transfers  Supervision  Locomotion  Supervision  Communication     Cognition  Supervision  Pain  n/a  Safety/Judgment  maintain safety with cues/reminders   Therapy Plan: PT Intensity: Minimum of 1-2 x/day ,45 to 90 minutes PT Frequency: 5 out of 7 days PT Duration Estimated Length of Stay: 2 weeks OT Intensity: Minimum of 1-2 x/day, 45 to 90 minutes OT Frequency: 5 out of 7 days OT Duration/Estimated Length of Stay: 2 weeks SLP Intensity: Minumum of 1-2 x/day, 30 to 90 minutes SLP Frequency: 3 to 5 out of 7 days SLP Duration/Estimated Length of Stay: 2 weeks    Team Interventions: Nursing Interventions Patient/Family Education, Disease Management/Prevention, Discharge Planning, Cognitive Remediation/Compensation, Medication Management  PT interventions  Ambulation/gait training, Community reintegration, DME/adaptive equipment instruction, Neuromuscular re-education, Psychosocial support, Stair training, UE/LE Strength taining/ROM, UE/LE Coordination activities, Therapeutic Activities, Pain management, Discharge planning, Training and development officer, Cognitive remediation/compensation, Disease management/prevention, Functional mobility training, Patient/family education, Therapeutic Exercise, Visual/perceptual remediation/compensation  OT Interventions Training and development officer, Cognitive remediation/compensation, Community reintegration, Discharge planning, Functional mobility training, Psychosocial support, Therapeutic Activities, UE/LE Coordination activities, Patient/family education, DME/adaptive equipment instruction, UE/LE Strength taining/ROM, Wheelchair propulsion/positioning, Therapeutic Exercise, Self Care/advanced ADL retraining, Neuromuscular re-education  SLP Interventions Cognitive remediation/compensation, Patient/family education  TR Interventions    SW/CM Interventions Discharge Planning, Psychosocial Support, Patient/Family Education   Barriers to Discharge MD  Medical stability  Nursing      PT      OT Other (comments) none known at this time  SLP      SW       Team Discharge Planning: Destination: PT-Home ,OT- Home , SLP-Home Projected Follow-up: PT-Outpatient PT, 24 hour supervision/assistance, OT-  Outpatient OT, 24 hour supervision/assistance, SLP-Home Health SLP, 24 hour supervision/assistance Projected Equipment Needs: PT-To be determined, OT- To be determined, SLP-None recommended by SLP Equipment Details: PT-likely none needed, OT-  Patient/family involved in discharge planning: PT- Patient, Family member/caregiver,  OT-Patient, SLP-Patient, Other (Comment) (Family friend present who is a PA - was texting daughter - all in agreement with ST plan)  MD ELOS: 2 weeks Medical Rehab Prognosis:   Excellent Assessment: The patient has been admitted for CIR therapies with the diagnosis of debilty and encephalopathy. The team will be addressing functional mobility, strength, stamina, balance, safety, adaptive techniques and equipment,  self-care, bowel and bladder mgt, patient and caregiver education, NMR, cognition, communication. Goals have been set at supervision for mobility, self-care and cognition.    Meredith Staggers, MD, FAAPMR      See Team Conference Notes for weekly updates to the plan of care

## 2017-03-29 NOTE — Progress Notes (Signed)
Speech Language Pathology Daily Session Note  Patient Details  Name: Bryan Carney MRN: [id number] Date of Birth: [birth year]  Today's Date: 03/29/2017 SLP Individual Time: 1000-1030 SLP Individual Time Calculation (min): 30 min  Short Term Goals: Week 1: SLP Short Term Goal 1 (Week 1): Pt will utilize external memory aids to recall new information with Mod A cues.  SLP Short Term Goal 2 (Week 1): Pt will sustain attention to task for ~ 5 minutes with Mod A cues.  SLP Short Term Goal 3 (Week 1): Pt will complete mildly complex problem solving task with Min A cues.  SLP Short Term Goal 4 (Week 1): Pt will respond to mildly complex yes/no questions in timely manner, with 90% accuracy and superviison cues for timeliness of response.  SLP Short Term Goal 5 (Week 1): Pt will demonstrate intellectual awareness by listing 2 physical and 2 cognitive deficits that are acute with Min A cues.   Skilled Therapeutic Interventions: Skilled treatment session focused on cognitive goals. SLP facilitated session by administering the MoCA-BLIND (due to time constraints). Patient scored 16/22 points with a score of 18 or above considered normal. Patient demonstrated deficits in short-term recall, abstract reasoning and language fluency. Patient left supine in bed with all needs within reach. Continue with current plan of care.      Function:  Eating Eating                 Cognition Comprehension Comprehension assist level: Understands basic 90% of the time/cues < 10% of the time  Expression   Expression assist level: Expresses complex ideas: With extra time/assistive device  Social Interaction Social Interaction assist level: Interacts appropriately with others - No medications needed.  Problem Solving Problem solving assist level: Solves basic 90% of the time/requires cueing < 10% of the time  Memory Memory assist level: Recognizes or recalls 50 - 74% of the time/requires cueing 25 - 49% of the time     Pain No/Denies Pain   Therapy/Group: Individual Therapy  Kriston Pasquarello, [place] 03/29/2017, 3:52 PM

## 2017-03-29 NOTE — Progress Notes (Signed)
Patient more fatigued this am with therapy attempts. Monitor reading HR at 44.  Apically on auscultation HR irregular with 68 bpm. Will order EKG to evaluate rhythm.

## 2017-03-29 NOTE — IPOC Note (Signed)
Overall Plan of Care [facility]) Patient Details Name: Bryan Carney MRN: [id number] DOB: [birth year]  Admitting Diagnosis: Aspiration pneumonia of left lower lobe due to gastric secretions [facility]), debility  Hospital Problems: Principal Problem:   Aspiration pneumonia of left lower lobe due to gastric secretions [facility]) Active Problems:   Debility     Functional Problem List: Nursing Bowel  PT Balance, Endurance, Motor, Safety, Perception  OT Balance, Cognition, Edema, Endurance, Motor, Pain, Safety, Skin Integrity  SLP Cognition  TR         Basic ADL's: OT Grooming, Bathing, Dressing, Toileting     Advanced  ADL's: OT       Transfers: PT Bed Mobility, Bed to Chair, Car, Manufacturing systems engineer, Metallurgist: PT Ambulation, Stairs     Additional Impairments: OT None, Fuctional Use of Upper Extremity  SLP Social Cognition   Problem Solving, Memory, Attention  TR      Anticipated Outcomes Item Anticipated Outcome  Self Feeding n/a  Swallowing      Basic self-care  supervision  Toileting  supervision   Bathroom Transfers supervision  Bowel/Bladder  manage bowel and bladder with mod I assist  Transfers  Supervision  Locomotion  Supervision  Communication     Cognition  Supervision  Pain  n/a  Safety/Judgment  maintain safety with cues/reminders   Therapy Plan: PT Intensity: Minimum of 1-2 x/day ,45 to 90 minutes PT Frequency: 5 out of 7 days PT Duration Estimated Length of Stay: 2 weeks OT Intensity: Minimum of 1-2 x/day, 45 to 90 minutes OT Frequency: 5 out of 7 days OT Duration/Estimated Length of Stay: 2 weeks SLP Intensity: Minumum of 1-2 x/day, 30 to 90 minutes SLP Frequency: 3 to 5 out of 7 days SLP Duration/Estimated Length of Stay: 2 weeks    Team Interventions: Nursing Interventions Patient/Family Education, Disease Management/Prevention, Discharge Planning, Cognitive Remediation/Compensation, Medication Management  PT  interventions Ambulation/gait training, Community reintegration, DME/adaptive equipment instruction, Neuromuscular re-education, Psychosocial support, Stair training, UE/LE Strength taining/ROM, UE/LE Coordination activities, Therapeutic Activities, Pain management, Discharge planning, Training and development officer, Cognitive remediation/compensation, Disease management/prevention, Functional mobility training, Patient/family education, Therapeutic Exercise, Visual/perceptual remediation/compensation  OT Interventions Training and development officer, Cognitive remediation/compensation, Community reintegration, Discharge planning, Functional mobility training, Psychosocial support, Therapeutic Activities, UE/LE Coordination activities, Patient/family education, DME/adaptive equipment instruction, UE/LE Strength taining/ROM, Wheelchair propulsion/positioning, Therapeutic Exercise, Self Care/advanced ADL retraining, Neuromuscular re-education  SLP Interventions Cognitive remediation/compensation, Patient/family education  TR Interventions    SW/CM Interventions Discharge Planning, Psychosocial Support, Patient/Family Education   Barriers to Discharge MD  Medical stability  Nursing      PT      OT Other (comments) none known at this time  SLP      SW       Team Discharge Planning: Destination: PT-Home ,OT- Home , SLP-Home Projected Follow-up: PT-Outpatient PT, 24 hour supervision/assistance, OT-  Outpatient OT, 24 hour supervision/assistance, SLP-Home Health SLP, 24 hour supervision/assistance Projected Equipment Needs: PT-To be determined, OT- To be determined, SLP-None recommended by SLP Equipment Details: PT-likely none needed, OT-  Patient/family involved in discharge planning: PT- Patient, Family member/caregiver,  OT-Patient, SLP-Patient, Other (Comment) (Family friend present who is a PA - was texting daughter - all in agreement with ST plan)  MD ELOS: 2 weeks Medical Rehab Prognosis:   Excellent Assessment: The patient has been admitted for CIR therapies with the diagnosis of debility/encephalopathy . The team will be addressing functional mobility, strength, stamina, balance, safety, adaptive techniques and equipment,  self-care, bowel and bladder mgt, patient and caregiver education, NMR, activity tolerance, vestibular assessment, cognitive-perceptual awareness, communication. Goals have been set at supervision for mobility, self-care and cognition.    Meredith Staggers, MD, FAAPMR      See Team Conference Notes for weekly updates to the plan of care

## 2017-03-29 NOTE — Progress Notes (Signed)
Physical Therapy Session Note  Patient Details  Name: Bryan Carney MRN: [id number] Date of Birth: [birth year]  Today's Date: 03/29/2017 PT Individual Time: 0900-0950 PT Individual Time Calculation (min): 50 min   Short Term Goals: Week 1:  PT Short Term Goal 1 (Week 1): Pt will demonstrate bed mobility with S PT Short Term Goal 2 (Week 1): pt will demonstrate stand pivot transfers with min guard PT Short Term Goal 3 (Week 1): Pt will demonstrate gait x150' with min guard PT Short Term Goal 4 (Week 1): Pt will demonstrate ascent/descent four 6" stairs with B handrails and minA  Skilled Therapeutic Interventions/Progress Updates:  Pt received supine in bed, denies pain and agreeable to treatment. Supine>sit with increased time and S with bedrails. Pt dons shoes with setupA and increased time. Gait in/out of bathroom minA with occasional staggering; pt performs clothing management and hygiene with S. Gait to gym with minA overall, however occasional LOB to R side requiring modA to correct. Sit <>stand 1x10 reps with no UE support; increased time required. Gait in hallway side stepping and [place] with min/modA. Pt requires several extended seated rest breaks between each activity. BP assessed 114/52 and HR 44bpm; PA present to assess and recommended returning pt to bed and plan to order EKG to assess rhythm. Returned pt to room min/modA; sit >supine with S. Remained supine in bed with alarm intact, RN alerted to pt position.     Therapy Documentation Precautions:  Precautions Precautions: Fall Required Braces or Orthoses: Other Brace/Splint Other Brace/Splint: Breast binder over R chest hematoma Restrictions Weight Bearing Restrictions: No General: PT Amount of Missed Time (min): 10 Minutes PT Missed Treatment Reason: Other (Comment) (returned to room for EKG per PA request) Pain: Pain Assessment Pain Assessment: No/denies pain   See Function Navigator for Current Functional  Status.   Therapy/Group: Individual Therapy  Luberta Mutter 03/29/2017, 9:59 AM

## 2017-03-29 NOTE — Care Management Note (Signed)
[facility] Individual Statement of Services  Patient Name:  Bryan Carney  Date:  03/29/2017  Welcome to the [facility].  Our goal is to provide you with an individualized program based on your diagnosis and situation, designed to meet your specific needs.  With this comprehensive rehabilitation program, you will be expected to participate in at least 3 hours of rehabilitation therapies Monday-Friday, with modified therapy programming on the weekends.  Your rehabilitation program will include the following services:  Physical Therapy (PT), Occupational Therapy (OT), Speech Therapy (ST), 24 hour per day rehabilitation nursing, Therapeutic Recreaction (TR), Neuropsychology, Case Management (Social Worker), Rehabilitation Medicine, Nutrition Services and Pharmacy Services  Weekly team conferences will be held on Tuesdays to discuss your progress.  Your Social Worker will talk with you frequently to get your input and to update you on team discussions.  Team conferences with you and your family in attendance may also be held.  Expected length of stay: 2 weeks  Overall anticipated outcome: supervision  Depending on your progress and recovery, your program may change. Your Social Worker will coordinate services and will keep you informed of any changes. Your Social Worker's name and contact numbers are listed  below.  The following services may also be recommended but are not provided by the [facility] will be made to provide these services after discharge if needed.  Arrangements include referral to agencies that provide these services.  Your insurance has been verified to be:  [employer] Your primary doctor is:  Amy [name]  Pertinent information will be shared with your doctor and your insurance company.  Social  Worker:  [place], [place] or ([phone]   Information discussed with and copy given to patient by: Lennart Pall, 03/29/2017, 9:27 AM

## 2017-03-30 ENCOUNTER — Inpatient Hospital Stay (HOSPITAL_COMMUNITY): Payer: Medicare Other | Admitting: Physical Therapy

## 2017-03-30 ENCOUNTER — Inpatient Hospital Stay (HOSPITAL_COMMUNITY): Payer: Medicare Other | Admitting: Speech Pathology

## 2017-03-30 ENCOUNTER — Inpatient Hospital Stay (HOSPITAL_COMMUNITY): Payer: Medicare Other | Admitting: Occupational Therapy

## 2017-03-30 LAB — GLUCOSE, CAPILLARY
GLUCOSE-CAPILLARY: 83 mg/dL (ref 65–99)
Glucose-Capillary: 153 mg/dL — ABNORMAL HIGH (ref 65–99)
Glucose-Capillary: 112 mg/dL — ABNORMAL HIGH (ref 65–99)
Glucose-Capillary: 134 mg/dL — ABNORMAL HIGH (ref 65–99)

## 2017-03-30 LAB — CBC
HEMATOCRIT: 25.6 % — AB (ref 39.0–52.0)
HEMOGLOBIN: 8.4 g/dL — AB (ref 13.0–17.0)
MCH: 30.8 pg (ref 26.0–34.0)
MCHC: 32.8 g/dL (ref 30.0–36.0)
MCV: 93.8 fL (ref 78.0–100.0)
Platelets: 426 10*3/uL — ABNORMAL HIGH (ref 150–400)
RBC: 2.73 MIL/uL — AB (ref 4.22–5.81)
RDW: 14.1 % (ref 11.5–15.5)
WBC: 10.7 10*3/uL — ABNORMAL HIGH (ref 4.0–10.5)

## 2017-03-30 LAB — BASIC METABOLIC PANEL
ANION GAP: 10 (ref 5–15)
BUN: 31 mg/dL — ABNORMAL HIGH (ref 6–20)
CHLORIDE: 100 mmol/L — AB (ref 101–111)
CO2: 23 mmol/L (ref 22–32)
Calcium: 8.1 mg/dL — ABNORMAL LOW (ref 8.9–10.3)
Creatinine, Ser: 1.35 mg/dL — ABNORMAL HIGH (ref 0.61–1.24)
GFR calc Af Amer: 57 mL/min — ABNORMAL LOW (ref >60)
GFR calc non Af Amer: 49 mL/min — ABNORMAL LOW (ref >60)
GLUCOSE: 97 mg/dL (ref 65–99)
POTASSIUM: 3.3 mmol/L — AB (ref 3.5–5.1)
Sodium: 133 mmol/L — ABNORMAL LOW (ref 135–145)

## 2017-03-30 MED ORDER — POTASSIUM CHLORIDE CRYS ER 20 MEQ PO TBCR
20.0000 meq | EXTENDED_RELEASE_TABLET | Freq: Two times a day (BID) | ORAL | Status: AC
  Administered 2017-03-30 (×2): 20 meq via ORAL
  Filled 2017-03-30 (×2): qty 1

## 2017-03-30 NOTE — Progress Notes (Signed)
Speech Language Pathology Daily Session Note  Patient Details  Name: Bryan Carney MRN: [id number] Date of Birth: 1940-09-14  Today's Date: 03/30/2017 SLP Individual Time: 0725-0805 SLP Individual Time Calculation (min): 40 min  Short Term Goals: Week 1: SLP Short Term Goal 1 (Week 1): Pt will utilize external memory aids to recall new information with Mod A cues.  SLP Short Term Goal 2 (Week 1): Pt will sustain attention to task for ~ 5 minutes with Mod A cues.  SLP Short Term Goal 3 (Week 1): Pt will complete mildly complex problem solving task with Min A cues.  SLP Short Term Goal 4 (Week 1): Pt will respond to mildly complex yes/no questions in timely manner, with 90% accuracy and superviison cues for timeliness of response.  SLP Short Term Goal 5 (Week 1): Pt will demonstrate intellectual awareness by listing 2 physical and 2 cognitive deficits that are acute with Min A cues.   Skilled Therapeutic Interventions: Skilled treatment session focused on cognitive goals. SLP facilitated session by providing Min A verbal cues for recall of the functions of his current medications. Patient verbally described his previous way of managing his medications at home but may benefit from utilizing a pill box to maximize recall 2/2 his current memory impairments. However, patient declined pill box due to decreased awareness of cognitive deficits. Patient continues to report he feels he is at his cognitive baseline and requires Min A verbal cues for recall of physical deficits. Patient demonstrated sustained attention throughout session for ~5 minute intervals with supervision verbal cues for redirection.  Patient eft sitting EOB with shaving his face with an electric razor per his request with alarm on and all needs within reach. Continue with current plan of care.      Function:  Cognition Comprehension Comprehension assist level: Understands basic 75 - 89% of the time/ requires cueing 10 - 24% of the  time  Expression   Expression assist level: Expresses basic needs/ideas: With extra time/assistive device  Social Interaction Social Interaction assist level: Interacts appropriately 90% of the time - Needs monitoring or encouragement for participation or interaction.  Problem Solving Problem solving assist level: Solves basic 90% of the time/requires cueing < 10% of the time  Memory Memory assist level: Recognizes or recalls 50 - 74% of the time/requires cueing 25 - 49% of the time    Pain No/Denies Pain   Therapy/Group: Individual Therapy  Sheniya Garciaperez 03/30/2017, 8:24 AM

## 2017-03-30 NOTE — Progress Notes (Signed)
Physical Therapy Session Note  Patient Details  Name: Bryan Carney MRN: [id number] Date of Birth: [birth year]  Today's Date: 03/30/2017 PT Individual Time: 1101-1201 PT Individual Time Calculation (min): 60 min   Short Term Goals: Week 1:  PT Short Term Goal 1 (Week 1): Pt will demonstrate bed mobility with S PT Short Term Goal 2 (Week 1): pt will demonstrate stand pivot transfers with min guard PT Short Term Goal 3 (Week 1): Pt will demonstrate gait x150' with min guard PT Short Term Goal 4 (Week 1): Pt will demonstrate ascent/descent four 6" stairs with B handrails and minA  Skilled Therapeutic Interventions/Progress Updates:  Pt was seen bedside in the am. Pt transferred supine to edge of bed with head of bed elevated, side rail and no assist. Pt performed all sit to stand and stand pivot transfers with min guard. Pt performed toilet transfers with min guard. Treatment in gym focused on NMR utilizing step taps and alternating step taps 3 sets x 10 reps each. Pt rode Nu-step at level 3 x 10 minutes. Pt ambulated 150 feet x 2 and 125 feet without assistive device and min guard with verbal cues. Pt returned to room and left sitting up in w/c with chair alarm in place.   Therapy Documentation Precautions:  Precautions Precautions: Fall Required Braces or Orthoses: Other Brace/Splint Other Brace/Splint: Breast binder over R chest hematoma Restrictions Weight Bearing Restrictions: No General:   Pain: Pain Assessment Pain Assessment: No/denies pain   See Function Navigator for Current Functional Status.   Therapy/Group: Individual Therapy  Dub Amis 03/30/2017, 12:10 PM

## 2017-03-30 NOTE — Progress Notes (Signed)
Occupational Therapy Session Note  Patient Details  Name: MANSON LUCKADOO MRN: [id number] Date of Birth: 02-03-1941  Today's Date: 03/30/2017 OT Individual Time: 0902-1000 and 3557-3220 OT Individual Time Calculation (min): 58 min and 28 min  Short Term Goals: Week 1:  OT Short Term Goal 1 (Week 1): Pt will engaged in 10 minutes of functional activity with less than 2 rest breaks. OT Short Term Goal 2 (Week 1): Pt will perform toilet transfer with min A.  OT Short Term Goal 3 (Week 1): Pt will perform LB dressing with min A.  OT Short Term Goal 4 (Week 1): Pt will perform bathing from shower level with min A.   Skilled Therapeutic Interventions/Progress Updates:    Tx focus on dynamic balance, activity tolerance, and standing tolerance during meaningful IADL engagement.   Pt greeted supine in bed. Breast binder already donned. He declined B/D due to having already completed self care this AM. Pt ambulated to family room with Min A and no device. Pt with posterior lean and small LOBs to Rt side. Had him sweep family room with Min A, using LH dustpan as needed. Sit<stand from low couch with Min A after 2 seated rest breaks. He then ambulated into dayroom, where he completed laundry folding standing/seated as tolerated at elevated table (Min A sit<stand from armless chair). Problem solving challenges with folding large bed sheet. Discussed energy conservation strategies at home to continue assisting his spouse with laundry. Encouraged him to stand when he could to work on balance/standing tolerance post d/c. Pt limited by back pain today, but after he sat down, he reported this subsided. Pt then ambulated back to room with Min A, completed toilet transfer/tasks at ambulatory level, before returning to bed. Pt left with all needs within reach and bed alarm activated at time of departure.   2nd Session 1:1 tx (28 min) Tx focus on memory and problem solving during leisure engagement.   Pt greeted in  w/c, reporting fatigue from ambulating this AM and requested for seated therapy in room. Pt engaging in new learning game of Blink. Pt requiring min-mod vcs for short term memory recall to follow discussed instructions. At end of tx pt was left in w/c with all needs within reach.   Therapy Documentation Precautions:  Precautions Precautions: Fall Required Braces or Orthoses: Other Brace/Splint Other Brace/Splint: Breast binder over R chest hematoma Restrictions Weight Bearing Restrictions: No Pain: Min c/o back pain when standing for prolonged period of time. Per pt, subsided with seated rest.    ADL:     See Function Navigator for Current Functional Status.   Therapy/Group: Individual Therapy  Lashone Stauber A Kegan Mckeithan 03/30/2017, 12:35 PM

## 2017-03-30 NOTE — Progress Notes (Signed)
Patient ID: TADHG ESKEW, male   DOB: Jun 30, 1940, 76 y.o.   MRN: [id number]   03/30/2017.  Bryan Carney is a 77 y.o. male  who is admitted for CIR with limitations with mobility, weakness, and ability to complete ADLs secondary to debility and delirium. Patient was middle hospital on September 26 with syncope, right chest hematoma and profound weakness.  Patient has a history of pulmonary embolism and has been on chronic Coumadin anticoagulation.  This required reversal with vitamin K and FFP and transfusion for hemoglobin of 6.4 grams percent.  Hospital course complicated by atrial fibrillation and delirium.  Past Medical History:  Diagnosis Date  . CAD (coronary artery disease)   . Diabetes ([place])   . Hx pulmonary embolism   . Hyperlipidemia   . Hypertension   . Prostate cancer ([place])   . PVOD (pulmonary veno-occlusive disease) ([place])   . Squamous cell carcinoma       Subjective: No new complaints.  Remains weak.  No cough or pulmonary complaints  Objective: Vital signs in last 24 hours: Temp:  [97.5 F (36.4 C)-98.4 F (36.9 C)] 97.5 F (36.4 C) (10/06 0409) Pulse Rate:  [66-71] 71 (10/06 0409) Resp:  [17-18] 18 (10/06 0409) BP: (131-145)/(54-65) 145/54 (10/06 0409) SpO2:  [95 %-97 %] 95 % (10/06 0409) Weight:  [126 lb 6.4 oz (57.3 kg)] 126 lb 6.4 oz (57.3 kg) (10/06 0409) Weight change: -17 lb 9.4 oz (-7.977 kg) Last BM Date: 03/29/17  Intake/Output from previous day: 10/05 0701 - 10/06 0700 In: 530 [P.O.:530] Out: 875 [Urine:875] Last cbgs: CBG (last 3)   Recent Labs  03/29/17 1612 03/29/17 2147 03/30/17 0629  GLUCAP 144* 111* 153*   BP Readings from Last 3 Encounters:  03/30/17 (!) 145/54  03/27/17 (!) 154/105  03/19/17 (!) 143/88    Physical Exam General: No apparent distress   HEENT: not dry Lungs: Normal effort. Lungs clear to auscultation, no crackles or wheezes.  Right chest wall hematoma, improving Cardiovascular: Regular rate and rhythm, no  edema Abdomen: S/NT/ND; BS(+) Musculoskeletal:  unchanged Neurological: No new neurological deficits.  Generally weak Extremities.  No edema Skin: clear  Mental state: Alert, oriented, cooperative    Lab Results: BMET    Component Value Date/Time   NA 133 (L) 03/30/2017 0521   K 3.3 (L) 03/30/2017 0521   CL 100 (L) 03/30/2017 0521   CO2 23 03/30/2017 0521   GLUCOSE 97 03/30/2017 0521   BUN 31 (H) 03/30/2017 0521   CREATININE 1.35 (H) 03/30/2017 0521   CALCIUM 8.1 (L) 03/30/2017 0521   GFRNONAA 49 (L) 03/30/2017 0521   GFRAA 57 (L) 03/30/2017 0521   CBC    Component Value Date/Time   WBC 10.7 (H) 03/30/2017 0521   RBC 2.73 (L) 03/30/2017 0521   HGB 8.4 (L) 03/30/2017 0521   HCT 25.6 (L) 03/30/2017 0521   PLT 426 (H) 03/30/2017 0521   MCV 93.8 03/30/2017 0521   MCH 30.8 03/30/2017 0521   MCHC 32.8 03/30/2017 0521   RDW 14.1 03/30/2017 0521   LYMPHSABS 0.5 (L) 03/28/2017 0533   MONOABS 1.3 (H) 03/28/2017 0533   EOSABS 0.0 03/28/2017 0533   BASOSABS 0.0 03/28/2017 0533    Studies/Results: No results found.  Medications: I have reviewed the patient's current medications.  Assessment/Plan:  Limitations with mobility, weakness, and ability to complete ADLs secondary to debility and delirium.             -continue CIR therapies  PE/Superfical  DVT RUE cephalic vein/anticoagulation:  -Eliquis Essential hypertension, stable Mild hypokalemia.  Will supplement Diabetes mellitus.  Will continue present regimen and SSI for elevated blood sugars   Length of stay, days: 3  Bryan Carney , MD 03/30/2017, 9:53 AM

## 2017-03-31 ENCOUNTER — Inpatient Hospital Stay (HOSPITAL_COMMUNITY): Payer: Medicare Other | Admitting: Occupational Therapy

## 2017-03-31 LAB — GLUCOSE, CAPILLARY
GLUCOSE-CAPILLARY: 94 mg/dL (ref 65–99)
Glucose-Capillary: 99 mg/dL (ref 65–99)
Glucose-Capillary: 219 mg/dL — ABNORMAL HIGH (ref 65–99)
Glucose-Capillary: 193 mg/dL — ABNORMAL HIGH (ref 65–99)

## 2017-03-31 NOTE — Progress Notes (Signed)
Occupational Therapy Session Note  Patient Details  Name: Bryan Carney MRN: [id number] Date of Birth: July 23, 1940  Today's Date: 03/31/2017 OT Individual Time: 0800-0900 OT Individual Time Calculation (min): 60 min    Short Term Goals: Week 1:  OT Short Term Goal 1 (Week 1): Pt will engaged in 10 minutes of functional activity with less than 2 rest breaks. OT Short Term Goal 2 (Week 1): Pt will perform toilet transfer with min A.  OT Short Term Goal 3 (Week 1): Pt will perform LB dressing with min A.  OT Short Term Goal 4 (Week 1): Pt will perform bathing from shower level with min A.      Skilled Therapeutic Interventions/Progress Updates:    Pt lying in bed upon OT arrival.  Focus of treatment was bed mobility, transfers,  sitting balance, standing balance, therapeutic activities,, postural control , Strength, ROM.  Pt ambulated to bathroom with SBA.  Transferred to toilet and doffed clothes with SBA.  Continent of bowel and bladder.  Pt independent  with peri care.  Ambulated to shower stall.  Ppt performed bathing  in sitting and standing with SBA.  Ppt dressed self with SBA/setup.  No SOB noted during session, but ppt reported he was tired and returned to bed at end of session.    Left pt in bed with bed alarm on and call bell,phone within reach.       Therapy Documentation Precautions:  Precautions Precautions: Fall Required Braces or Orthoses: Other Brace/Splint Other Brace/Splint: Breast binder over R chest hematoma Restrictions Weight Bearing Restrictions: No General:    Pain:  none     See Function Navigator for Current Functional Status.   Therapy/Group: Individual Therapy  Lisa Roca 03/31/2017, 1:02 PM

## 2017-03-31 NOTE — Discharge Instructions (Addendum)
Inpatient Rehab Discharge Instructions  Bryan Carney Discharge date and time:  04/05/17  Activities/Precautions/ Functional Status: Activity: No lifting items over 5 lbs, driving, or strenuous exercise till cleared by MD.  Diet: cardiac diet and diabetic diet Wound Care: none needed   Functional status:  ___ No restrictions     ___ Walk up steps independently ___ 24/7 supervision/assistance   ___ Walk up steps with assistance _X__ Intermittent supervision/assistance  _X__ Bathe/dress independently ___ Walk with walker     ___ Bathe/dress with assistance ___ Walk Independently    ___ Shower independently ___ Walk with assistance    ___ Shower with assistance _X__ No alcohol     ___ Return to work/school ________   Special Instructions: 1. Dose of Zocor decreased to avoid side effects with Cardizem.  2. You are not to take Kombiglyze anymore. Monitor blood sugars at least twice a day and follow up with primary for adjustment in medications. 3. Drink plenty of fluids during the day.     My questions have been answered and I understand these instructions. I will adhere to these goals and the provided educational materials after my discharge from the hospital.  Patient/Caregiver Signature _______________________________ Date __________  Clinician Signature _______________________________________ Date __________  Please bring this form and your medication list with you to all your follow-up doctor's appointments.     Information on my medicine - ELIQUIS (apixaban)  This medication education was reviewed with me or my healthcare representative as part of my discharge preparation.   Why was Eliquis prescribed for you? Eliquis was prescribed to treat blood clots that may have been found in the veins of your legs (deep vein thrombosis) or in your lungs (pulmonary embolism) and to reduce the risk of them occurring again.  What do You need to know about Eliquis ? Take ONE 5 mg  tablet TWICE daily.  Eliquis may be taken with or without food.   Try to take the dose about the same time in the morning and in the evening. If you have difficulty swallowing the tablet whole please discuss with your pharmacist how to take the medication safely.  Take Eliquis exactly as prescribed and DO NOT stop taking Eliquis without talking to the doctor who prescribed the medication.  Stopping may increase your risk of developing a new blood clot.  Refill your prescription before you run out.  After discharge, you should have regular check-up appointments with your healthcare provider that is prescribing your Eliquis.    What do you do if you miss a dose? If a dose of ELIQUIS is not taken at the scheduled time, take it as soon as possible on the same day and twice-daily administration should be resumed. The dose should not be doubled to make up for a missed dose.  Important Safety Information A possible side effect of Eliquis is bleeding. You should call your healthcare provider right away if you experience any of the following: ? Bleeding from an injury or your nose that does not stop. ? Unusual colored urine (red or dark brown) or unusual colored stools (red or black). ? Unusual bruising for unknown reasons. ? A serious fall or if you hit your head (even if there is no bleeding).  Some medicines may interact with Eliquis and might increase your risk of bleeding or clotting while on Eliquis. To help avoid this, consult your healthcare provider or pharmacist prior to using any new prescription or non-prescription medications, including herbals, vitamins, non-steroidal anti-inflammatory  drugs (NSAIDs) and supplements.  This website has more information on Eliquis (apixaban): http://www.eliquis.com/eliquis/home

## 2017-03-31 NOTE — Progress Notes (Signed)
Patient ID: Bryan Carney, male   DOB: 20-Aug-1940, 76 y.o.   MRN: [id number]   03/31/17.  Bryan Carney is a 76 y.o. male who is admitted for CIR with weakness and deficits in ADLs secondary to debility and delirium.  He was admitted to the hospital on September 26.  Following syncope with right chest hematoma and profound weakness.  Due to a history of pulmonary embolism.  He had been on chronic Coumadin anticoagulation.  This required reversal with FFP and vitamin K and transfusion due to severe anemia with a hemoglobin of 6.4  Past Medical History:  Diagnosis Date  . CAD (coronary artery disease)   . Diabetes ([place])   . Hx pulmonary embolism   . Hyperlipidemia   . Hypertension   . Prostate cancer ([place])   . PVOD (pulmonary veno-occlusive disease) ([place])   . Squamous cell carcinoma       Subjective:  Remains weak but appears stronger today.  Sitting up in feeding himself breakfast.  Denies any cough or shortness of breath.  Remains on IV Zosyn.   Objective: Vital signs in last 24 hours: Temp:  [97.6 F (36.4 C)-97.8 F (36.6 C)] 97.8 F (36.6 C) (10/07 0530) Pulse Rate:  [67-70] 70 (10/07 0530) Resp:  [18] 18 (10/07 0530) BP: (105-130)/(58-60) 130/60 (10/07 0530) SpO2:  [94 %-98 %] 98 % (10/07 0530) Weight change:  Last BM Date: 03/29/17  Intake/Output from previous day: 10/06 0701 - 10/07 0700 In: 582 [P.O.:582] Out: 625 [Urine:625] Last cbgs: CBG (last 3)   Recent Labs  03/30/17 1653 03/30/17 2103 03/31/17 0641  GLUCAP 83 134* 99   BP Readings from Last 3 Encounters:  03/31/17 130/60  03/27/17 (!) 154/105  03/19/17 (!) 143/88    Physical Exam General: No apparent distress   HEENT: not dry Lungs: Normal effort. Lungs clear to auscultation, no crackles or wheezes. Right chest wall hematoma, improving Cardiovascular: Irregular rate and rhythm, no edema.  Grade 2/6 systolic murmur Abdomen: S/NT/ND; BS(+) Musculoskeletal:  unchanged Neurological: No new  neurological deficits.  Generalized weakness Extremities.  No edema Skin: clear  Aging changes Mental state: Alert, oriented, cooperative    Lab Results: BMET    Component Value Date/Time   NA 133 (L) 03/30/2017 0521   K 3.3 (L) 03/30/2017 0521   CL 100 (L) 03/30/2017 0521   CO2 23 03/30/2017 0521   GLUCOSE 97 03/30/2017 0521   BUN 31 (H) 03/30/2017 0521   CREATININE 1.35 (H) 03/30/2017 0521   CALCIUM 8.1 (L) 03/30/2017 0521   GFRNONAA 49 (L) 03/30/2017 0521   GFRAA 57 (L) 03/30/2017 0521   CBC    Component Value Date/Time   WBC 10.7 (H) 03/30/2017 0521   RBC 2.73 (L) 03/30/2017 0521   HGB 8.4 (L) 03/30/2017 0521   HCT 25.6 (L) 03/30/2017 0521   PLT 426 (H) 03/30/2017 0521   MCV 93.8 03/30/2017 0521   MCH 30.8 03/30/2017 0521   MCHC 32.8 03/30/2017 0521   RDW 14.1 03/30/2017 0521   LYMPHSABS 0.5 (L) 03/28/2017 0533   MONOABS 1.3 (H) 03/28/2017 0533   EOSABS 0.0 03/28/2017 0533   BASOSABS 0.0 03/28/2017 0533    Studies/Results: No results found.  Medications: I have reviewed the patient's current medications.  Assessment/Plan:  General debility.  Continue CIR therapies History of atrial fibrillation/pulmonary embolism/ right upper extremity DVT.  Continue Eliquis. Essential hypertension, stable Diabetes mellitus.  Continue sliding scale insulin coverage History of aspiration pneumonia.  Remains on  Zosyn    Length of stay, days: [place] , MD 03/31/2017, 10:40 AM

## 2017-04-01 ENCOUNTER — Inpatient Hospital Stay (HOSPITAL_COMMUNITY): Payer: Medicare Other

## 2017-04-01 ENCOUNTER — Inpatient Hospital Stay (HOSPITAL_COMMUNITY): Payer: Medicare Other | Admitting: Physical Therapy

## 2017-04-01 LAB — GLUCOSE, CAPILLARY
GLUCOSE-CAPILLARY: 171 mg/dL — AB (ref 65–99)
Glucose-Capillary: 118 mg/dL — ABNORMAL HIGH (ref 65–99)
Glucose-Capillary: 255 mg/dL — ABNORMAL HIGH (ref 65–99)
Glucose-Capillary: 172 mg/dL — ABNORMAL HIGH (ref 65–99)

## 2017-04-01 NOTE — Progress Notes (Signed)
Physical Therapy Session Note  Patient Details  Name: Bryan Carney MRN: [id number] Date of Birth: 1941/06/18  Today's Date: 04/01/2017 PT Individual Time: 1310-1340 PT Individual Time Calculation (min): 30 min   Short Term Goals: Week 1:  PT Short Term Goal 1 (Week 1): Pt will demonstrate bed mobility with S PT Short Term Goal 2 (Week 1): pt will demonstrate stand pivot transfers with min guard PT Short Term Goal 3 (Week 1): Pt will demonstrate gait x150' with min guard PT Short Term Goal 4 (Week 1): Pt will demonstrate ascent/descent four 6" stairs with B handrails and minA  Skilled Therapeutic Interventions/Progress Updates: Pt presented in bed agreeable to therapy. Performed supine to sit with use of features and additional time. Performed sit to stand from EOB with supervision and some noted use of bracing BLE against bed. Pt ambulated to day room min guard fade to supervision with pt taking short shuffling steps. Per pt some increased tenderness at hip area but resolves with rest. Performed Biodex LOS x 3 trials with noted decreased ankle strategy and pt using hips and shoulders for wt shift. Pt returned to room in same manner as prior and returned to bed with increased time and supervision. Discussed with wife and family (present in room) current mobility status and left pt with call bell within reach and needs met.      Therapy Documentation Precautions:  Precautions Precautions: Fall Required Braces or Orthoses: Other Brace/Splint Other Brace/Splint: Breast binder over R chest hematoma Restrictions Weight Bearing Restrictions: No General:   Vital Signs:  Pain: Pain Assessment Pain Assessment: No/denies pain  See Function Navigator for Current Functional Status.   Therapy/Group: Individual Therapy  Lorree Millar  Deitrich Steve, PTA  04/01/2017, 2:13 PM

## 2017-04-01 NOTE — Progress Notes (Signed)
Speech Language Pathology Daily Session Note  Patient Details  Name: Bryan Carney MRN: [id number] Date of Birth: March 30, 1941  Today's Date: 04/01/2017 SLP Individual Time: 5176-1607 SLP Individual Time Calculation (min): 48 min  Short Term Goals: Week 1: SLP Short Term Goal 1 (Week 1): Pt will utilize external memory aids to recall new information with Mod A cues.  SLP Short Term Goal 2 (Week 1): Pt will sustain attention to task for ~ 5 minutes with Mod A cues.  SLP Short Term Goal 3 (Week 1): Pt will complete mildly complex problem solving task with Min A cues.  SLP Short Term Goal 4 (Week 1): Pt will respond to mildly complex yes/no questions in timely manner, with 90% accuracy and superviison cues for timeliness of response.  SLP Short Term Goal 5 (Week 1): Pt will demonstrate intellectual awareness by listing 2 physical and 2 cognitive deficits that are acute with Min A cues.   Skilled Therapeutic Interventions: Skilled ST services focused on cognitive skills. SLP facilitated mildly complex scheduling problem solving task supervision A verbal cues requiring Min A verbal and visual cues for monitoring / correcting errors. Pt required Max A verbal cues in response to yes/no questions pertaining to scheduling activity unable to apply knowledge which was further impacted by attention impairment. Pt was left in room with wife. Recommend to continue ST services.   Function:  Eating Eating   Modified Consistency Diet: No Eating Assist Level: No help, No cues           Cognition Comprehension Comprehension assist level: Understands basic 75 - 89% of the time/ requires cueing 10 - 24% of the time  Expression   Expression assist level: Expresses basic needs/ideas: With no assist  Social Interaction Social Interaction assist level: Interacts appropriately 90% of the time - Needs monitoring or encouragement for participation or interaction.  Problem Solving Problem solving assist level:  Solves basic 75 - 89% of the time/requires cueing 10 - 24% of the time  Memory Memory assist level: Recognizes or recalls 75 - 89% of the time/requires cueing 10 - 24% of the time    Pain Pain Assessment Pain Assessment: No/denies pain  Therapy/Group: Individual Therapy  Mohmed Farver  [facility] 04/01/2017, 12:04 PM

## 2017-04-01 NOTE — Progress Notes (Signed)
Occupational Therapy Session Note  Patient Details  Name: Bryan Carney MRN: [id number] Date of Birth: Feb 29, 1941  Today's Date: 04/01/2017 OT Individual Time: 1000-1100 OT Individual Time Calculation (min): 60 min    Short Term Goals: Week 1:  OT Short Term Goal 1 (Week 1): Pt will engaged in 10 minutes of functional activity with less than 2 rest breaks. OT Short Term Goal 2 (Week 1): Pt will perform toilet transfer with min A.  OT Short Term Goal 3 (Week 1): Pt will perform LB dressing with min A.  OT Short Term Goal 4 (Week 1): Pt will perform bathing from shower level with min A.   Skilled Therapeutic Interventions/Progress Updates:    Pt resting in bed upon arrival with wife present.  Pt engaged in BADL retraining including bathing/dressing with sit<>stand from w/c at sink.  Pt declined shower this morning but agreed to shower tomorrow.  Pt completed all bathing/dressing tasks at supervision level with min verbal cues for safety awareness.  Pt amb without AD to ADL apartment (2 rest breaks) and returned to day room for BLE on Nustep (legs only on work load 3). Pt amb without AD back to room and returned to bed.  Pt remained in bed with wife present.   Therapy Documentation Precautions:  Precautions Precautions: Fall Required Braces or Orthoses: Other Brace/Splint Other Brace/Splint: Breast binder over R chest hematoma Restrictions Weight Bearing Restrictions: No  Pain: Pt c/o discomfort in R hip with activity; RN aware and repositioned See Function Navigator for Current Functional Status.   Therapy/Group: Individual Therapy  Leroy Libman 04/01/2017, 12:16 PM

## 2017-04-01 NOTE — Progress Notes (Signed)
Physical Therapy Session Note  Patient Details  Name: Bryan Carney MRN: [id number] Date of Birth: Nov 02, 1940  Today's Date: 04/01/2017 PT Individual Time: 0800-0900 PT Individual Time Calculation (min): 60 min   Short Term Goals: Week 1:  PT Short Term Goal 1 (Week 1): Pt will demonstrate bed mobility with S PT Short Term Goal 2 (Week 1): pt will demonstrate stand pivot transfers with min guard PT Short Term Goal 3 (Week 1): Pt will demonstrate gait x150' with min guard PT Short Term Goal 4 (Week 1): Pt will demonstrate ascent/descent four 6" stairs with B handrails and minA  Skilled Therapeutic Interventions/Progress Updates: Pt received supine in bed, denies pain and agreeable to treatment. Supine>sit with S, HOB elevated and bedrails. Pt dons shoes with S. Gait in/out of bathroom with no AD and close S. Pt manages clothing and performs hygiene modI. S for dynamic gait within room to wash hands, ambulate to hard back chair to rest. Gait to gym with min guard; one seated rest break after approximately 125' d/t fatigue. 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 Discussed energy conservation strategies with pt, pt reporting that "there are lounge chairs all over the place at my lot" and states he is "good about" recognizing when he is fatigued and resting as necessary. Standing LE strengthening exercises including heel/toe raises, hip abduction each 2x10 reps. Sit <>stand no UE support x10 reps. Standing balance on airex foam pad for facilitation of ankle and righting strategies. Significantly increased sway noted with activity; minA overall d/t anterior/posterior LOBs and delayed ankle strategy. Returned to room totalA in w/c d/t fatigue. Remained seated in w/c at end of session, all needs in reach.       Therapy Documentation Precautions:  Precautions Precautions: Fall Required Braces or  Orthoses: Other Brace/Splint Other Brace/Splint: Breast binder over R chest hematoma Restrictions Weight Bearing Restrictions: No  See Function Navigator for Current Functional Status.   Therapy/Group: Individual Therapy  Luberta Mutter 04/01/2017, 9:16 AM

## 2017-04-01 NOTE — Progress Notes (Signed)
[facility] PHYSICAL MEDICINE & REHABILITATION     PROGRESS NOTE    Subjective/Complaints: Up eating breakfast. Feels well. Denies pain  ROS: pt denies nausea, vomiting, diarrhea, cough, shortness of breath or chest pain   Objective: Vital Signs: Blood pressure (!) 172/56, pulse 76, temperature 97.8 F (36.6 C), temperature source Oral, resp. rate 18, weight 57.3 kg (126 lb 6.4 oz), SpO2 95 %. Dg Chest 2 View  Result Date: 04/01/2017 CLINICAL DATA:  Weakness and shortness of breath, follow-up possible right-sided pneumonia. EXAM: CHEST  2 VIEW COMPARISON:  Chest x-ray of March 27, 2017 FINDINGS: The lungs are borderline hypoinflated. There is minimal linear density at the right lung base compatible with subsegmental atelectasis. No alveolar infiltrate is noted on the right or left. There is no large pleural effusion. The cardiac silhouette is mildly enlarged. The pulmonary vascularity is not engorged. The patient has undergone previous CABG. There is calcification in the wall of the aortic arch. IMPRESSION: No discrete pneumonia. No pulmonary edema nor large pleural effusions. Minimal density at the right lung base suggests atelectasis. A PA and lateral chest x-ray would be useful if the patient can undergo the procedure. Previous CABG. Thoracic aortic atherosclerosis. Electronically Signed   By: David  Martinique M.D.   On: 04/01/2017 07:33    Recent Labs  03/30/17 0521  WBC 10.7*  HGB 8.4*  HCT 25.6*  PLT 426*    Recent Labs  03/30/17 0521  NA 133*  K 3.3*  CL 100*  GLUCOSE 97  BUN 31*  CREATININE 1.35*  CALCIUM 8.1*   CBG (last 3)   Recent Labs  03/31/17 1650 03/31/17 2129 04/01/17 0624  GLUCAP 193* 94 118*    Wt Readings from Last 3 Encounters:  03/30/17 57.3 kg (126 lb 6.4 oz)  03/23/17 64.9 kg (143 lb 1.3 oz)  03/18/17 55.3 kg (122 lb)    Physical Exam:  Constitutional: He is oriented to person, place, and time. He appears well-developed and well-nourished .   HENT:  Head: Normocephalic and atraumatic.  Eyes: Pupils are equal, round, and reactive to light. Conjunctivae and EOM are normal.  Neck: Normal range of motion. Neck supple.  Cardiovascular: RRR without murmur. No JVD .   Respiratory: CTA Bilaterally without wheezes or rales. Normal effort  GI: BS +, non-tender, non-distended .  Musculoskeletal: He exhibits no tenderness.  Min edema right biceps with resolving ecchymosis and hematoma. --stable  Neurological: alert. HOH.  Oriented to self, hospital. Follows all commands Motor: 4-/5 prox to distal in all 4 limbs. Senses pain in all 4's.  Skin: Skin is warm and dry. He is not diaphoretic.  Right chest wall hematoma improving   Psychiatric: His affect is pleasant  Assessment/Plan: 1. Functional deficits secondary to debility/delirium which require 3+ hours per day of interdisciplinary therapy in a comprehensive inpatient rehab setting. Physiatrist is providing close team supervision and 24 hour management of active medical problems listed below. Physiatrist and rehab team continue to assess barriers to discharge/monitor patient progress toward functional and medical goals.  Function:  Bathing Bathing position   Position: Shower  Bathing parts Body parts bathed by patient: Right arm, Left arm, Chest, Abdomen, Front perineal area, Buttocks, Right upper leg, Left upper leg, Right lower leg, Left lower leg Body parts bathed by helper: Back  Bathing assist Assist Level: Touching or steadying assistance(Pt > 75%)      Upper Body Dressing/Undressing Upper body dressing   What is the patient wearing?: Button up  shirt         Button up shirt - Perfomed by patient: Pull shirt around back, Thread/unthread left sleeve, Thread/unthread right sleeve, Button/unbutton shirt Button up shirt - Perfomed by helper: Thread/unthread right sleeve, Pull shirt around back, Button/unbutton shirt    Upper body assist Assist Level: Set up      Lower  Body Dressing/Undressing Lower body dressing   What is the patient wearing?: Underwear, Pants, Non-skid slipper socks Underwear - Performed by patient: Thread/unthread left underwear leg, Pull underwear up/down, Thread/unthread right underwear leg Underwear - Performed by helper: Thread/unthread right underwear leg Pants- Performed by patient: Thread/unthread right pants leg, Thread/unthread left pants leg, Pull pants up/down     Non-skid slipper socks- Performed by helper: Don/doff right sock, Don/doff left sock                  Lower body assist Assist for lower body dressing:  (mod a)      Toileting Toileting   Toileting steps completed by patient: Adjust clothing prior to toileting, Performs perineal hygiene, Adjust clothing after toileting Toileting steps completed by helper: Adjust clothing after toileting Toileting Assistive Devices: Grab bar or rail  Toileting assist Assist level: Touching or steadying assistance (Pt.75%)   Transfers Chair/bed transfer   Chair/bed transfer method: Stand pivot Chair/bed transfer assist level: Touching or steadying assistance (Pt > 75%) Chair/bed transfer assistive device: Armrests, Bedrails     Locomotion Ambulation     Max distance: 40 Assist level: Supervision or verbal cues   Wheelchair          Cognition Comprehension Comprehension assist level: Understands basic 90% of the time/cues < 10% of the time  Expression Expression assist level: Expresses basic 90% of the time/requires cueing < 10% of the time.  Social Interaction Social Interaction assist level: Interacts appropriately 90% of the time - Needs monitoring or encouragement for participation or interaction.  Problem Solving Problem solving assist level: Solves basic 90% of the time/requires cueing < 10% of the time  Memory Memory assist level: Recognizes or recalls 90% of the time/requires cueing < 10% of the time  Medical Problem List and Plan: 1.  Limitations with  mobility, weakness, and ability to complete ADLs secondary to debility and delirium.  -begin CIR therapies 2.  PE/Superfical DVT RUE cephalic vein/anticoagulation:  -Eliquis 3. Pain Management: tylenol prn 4. Mood: LCSW to follow for evaluation and support.  5. Neuropsych: This patient is not fully capable of making decisions on his own behalf. 6. Skin/Wound Care: Pressure relief measures.  7. Fluids/Electrolytes/Nutrition: Monitor I/O.   -BUN sl elevated still on Saturday Pushing po  -follow up on Tuesday 8. Right chest hematoma with ABLA: H/H stable.  CBC q 3 days to monitor for stability as anticoagulation resumed.  9. CAD/PAF: RVR has resolved. On Benazepril, diltiazem and atorvastatin. Hold ASA for now.   monitor HR bid 10.HTN: Monitor BP bid. Continues to be labile will monitor bid and titrate medications as indicated.  11. Acute kidney injury: SCr improved from 1.52--> 1.32. Continue to encourage fluid intake and avoid nephrotoxic mediations.  12. Leukocytosis:  Encourage IS with flutter valve -likely aspiration pneumonia    -follow up CXR personally reviewed and left lower lobe consolidation isn't evident.   -wbc's down to 10.7 on 10/6  -chest generally clear, afebrile, no cough. Handling diet well  -will dc zosyn today and observe 13. T2DM: Was on Kombiglyze 10/998 daily. No metformin due to AKI--question CKD?    -  continue low dose a Trajenta.   -increased amaryl to 2mg  daily--some daytime elevation---observe today  .  Continue SSI for elevated BS.  -continue low dose lantus for now ---dc soon 14. Hyponatremia: sodium holding at 133.    15. Delirium: Much improved but not fully cleared.  .  16. Lethargy: likely fatigue/stamina related  LOS (Days) 5 A FACE TO FACE EVALUATION WAS PERFORMED  Meredith Staggers, MD 04/01/2017 8:57 AM

## 2017-04-02 ENCOUNTER — Inpatient Hospital Stay (HOSPITAL_COMMUNITY): Payer: Medicare Other

## 2017-04-02 ENCOUNTER — Inpatient Hospital Stay (HOSPITAL_COMMUNITY): Payer: Medicare Other | Admitting: Occupational Therapy

## 2017-04-02 ENCOUNTER — Inpatient Hospital Stay (HOSPITAL_COMMUNITY): Payer: Medicare Other | Admitting: Physical Therapy

## 2017-04-02 DIAGNOSIS — E1165 Type 2 diabetes mellitus with hyperglycemia: Secondary | ICD-10-CM

## 2017-04-02 LAB — GLUCOSE, CAPILLARY
GLUCOSE-CAPILLARY: 144 mg/dL — AB (ref 65–99)
GLUCOSE-CAPILLARY: 80 mg/dL (ref 65–99)
Glucose-Capillary: 121 mg/dL — ABNORMAL HIGH (ref 65–99)
Glucose-Capillary: 164 mg/dL — ABNORMAL HIGH (ref 65–99)

## 2017-04-02 MED ORDER — GLIMEPIRIDE 2 MG PO TABS
2.0000 mg | ORAL_TABLET | Freq: Once | ORAL | Status: AC
  Administered 2017-04-02: 2 mg via ORAL
  Filled 2017-04-02: qty 1

## 2017-04-02 MED ORDER — GLIMEPIRIDE 4 MG PO TABS
4.0000 mg | ORAL_TABLET | Freq: Every day | ORAL | Status: DC
  Administered 2017-04-03 – 2017-04-05 (×3): 4 mg via ORAL
  Filled 2017-04-02 (×3): qty 1

## 2017-04-02 NOTE — Patient Care Conference (Signed)
Inpatient RehabilitationTeam Conference and Plan of Care Update Date: 04/02/2017   Time: 2:20 PM    Patient Name: Bryan Carney      Medical Record Number: [id number]  Date of Birth: Sep 03, 1940 Sex: Male         Room/Bed: 4W11C/4W11C-01 Payor Info: Payor: [place] / Plan: BCBS MEDICARE / Product Type: *No Product type* /    Admitting Diagnosis: Debility  Admit Date/Time:  03/27/2017  2:58 PM Admission Comments: No comment available   Primary Diagnosis:  Aspiration pneumonia of left lower lobe due to gastric secretions (HCC) Principal Problem: Aspiration pneumonia of left lower lobe due to gastric secretions [facility])  Patient Active Problem List   Diagnosis Date Noted  . Aspiration pneumonia of left lower lobe due to gastric secretions ([place]) 03/29/2017  . Debility 03/27/2017  . Chest wall hematoma, right, sequela   . AKI (acute kidney injury) ([place])   . Leukocytosis   . Delirium   . Clot   . Hypoxia   . Lethargic   . Acute delirium   . Coronary artery disease involving native coronary artery of native heart without angina pectoris   . Prostate cancer ([place])   . Paroxysmal atrial fibrillation (HCC)   . Coagulopathy ([place])   . Acute blood loss anemia 03/20/2017  . Hematoma 03/20/2017  . Hyponatremia 03/20/2017  . Supratherapeutic INR 03/20/2017  . Essential hypertension 07/07/2014  . Hyperlipidemia 07/07/2014  . Coronary artery disease due to lipid rich plaque 07/07/2014  . Carotid artery disease ([place]) 07/07/2014  . Type 2 diabetes mellitus without complication, without long-term current use of insulin ([place]) 07/07/2014  . Long term (current) use of anticoagulants 10/14/2012  . Acute pulmonary embolism ([place]) 10/14/2012    Expected Discharge Date: Expected Discharge Date: 04/05/17  Team Members Present: Physician leading conference: Dr. Alger Simons Social Worker Present: Lennart Pall, LCSW Nurse Present: Dwaine Gale, RN PT Present: Kem Parkinson,  PT OT Present: Willeen Cass, OT;Roanna Epley, COTA SLP Present: Charolett Bumpers, SLP PPS Coordinator present : Daiva Nakayama, RN, CRRN     Current Status/Progress Goal Weekly Team Focus  Medical   completed abx for pneumonia, improving cognition, local care to hematoma  improve activity tolerance,   rx pneumonia,  balance volume, optimize bp, hr   Bowel/Bladder   Continent of bowel/bladder. LBM 03/31/17  Remain continent of bowel/bladder while in reharb.  Aeeseed change for bowel/bladder function   Swallow/Nutrition/ Hydration             ADL's   supervision overall with occasional steady A/min A with functional ambulation; fatigues quickly and requires multiple rest breaks during BADLs  supervision overall  activity tolerance, standing balance, safety awareness, family education   Mobility   min guard overall, min/modA for higher level dynamic balance  S overall  activity tolerance, dynamic balance, LE strengthening   Communication             Safety/Cognition/ Behavioral Observations  Min A  Min - Supervision A  complex problem solving   Pain   Denied pain or discomfort  <2  assessed pain q shift and as needed   Skin   Right side bruises, R chest swelling. Chest binder all the time  Skin to be free form breakdown/infection while in reharb.  Assessed skin q shift and as needed    Rehab Goals Patient on target to meet rehab goals: Yes *See Care Plan and progress notes for long and short-term goals.  Barriers to Discharge  Current Status/Progress Possible Resolutions Date Resolved   Physician    Medical stability        ongoing medical mgt of cv issues      Nursing                  PT                    OT                  SLP                SW                Discharge Planning/Teaching Needs:  Plan to d/c home with wife and family who can provide 24/7 supervision      Team Discussion:  Making good progress and on track to reach supervision goals.  No concerns.   Plan for d/c end of week.  Recommending OPtx if transportation can be provided.  Revisions to Treatment Plan:  None    Continued Need for Acute Rehabilitation Level of Care: The patient requires daily medical management by a physician with specialized training in physical medicine and rehabilitation for the following conditions: Daily direction of a multidisciplinary physical rehabilitation program to ensure safe treatment while eliciting the highest outcome that is of practical value to the patient.: Yes Daily medical management of patient stability for increased activity during participation in an intensive rehabilitation regime.: Yes Daily analysis of laboratory values and/or radiology reports with any subsequent need for medication adjustment of medical intervention for : Neurological problems;Blood pressure problems  [name] 04/02/2017, 5:41 PM

## 2017-04-02 NOTE — Progress Notes (Signed)
Occupational Therapy Note  Patient Details  Name: Bryan Carney MRN: [id number] Date of Birth: 02-03-1941  Today's Date: 04/02/2017 OT Individual Time: 1000-1100 OT Individual Time Calculation (min): 60 min   Pt stated that pain in R hip is better Individual Therapy  Pt resting in bed upon arrival.  Pt already showered during earlier session.  Focus on functional amb without AD, dynamic standing balance, activity tolerance, and safety awareness to increased independence with BADLs.  Pt able to walk from ADL apartment to his room without rest break this morning.  Pt continues to exhibit delayed balance reactions.  Pt retrieved items from floor without assistance or AE.  Pt gait speed improved over previous day.  Pt returned to room and requested to sit in w/c.  Pt remained in w/c with all needs within reach.    Leotis Shames [facility] 04/02/2017, 12:02 PM

## 2017-04-02 NOTE — Progress Notes (Addendum)
Physical Therapy Session Note  Patient Details  Name: Bryan Carney MRN: [id number] Date of Birth: 03-Sep-1940  Today's Date: 04/02/2017 PT Individual Time: 0800-0900 PT Individual Time Calculation (min): 60 min   Short Term Goals: Week 1:  PT Short Term Goal 1 (Week 1): Pt will demonstrate bed mobility with S PT Short Term Goal 2 (Week 1): pt will demonstrate stand pivot transfers with min guard PT Short Term Goal 3 (Week 1): Pt will demonstrate gait x150' with min guard PT Short Term Goal 4 (Week 1): Pt will demonstrate ascent/descent four 6" stairs with B handrails and minA  Skilled Therapeutic Interventions/Progress Updates: Pt received seated in bed, c/o pain in R hip 5/10 and agreeable to treatment. Discussed various sleeping positions that may assist with reducing lateral hip pain; pt plans to try tonight. Supine>sit with modI, dons shoes with setupA and increased time. Gait to day room with close S, slow speed. Requires seated rest break after approximately 125' due to fatigue. After several minute rest break pt able to complete gait to nustep x50'. Performed BLE nustep x8 min, x4 min with rest break in between on level 3 with average 45 steps/min. Educated pt in energy conservation strategies, encouraged pt to direct care when rest breaks are needed as he has three therapy sessions this AM. Also discussed importance of continued exercise when pt discharges, recommended walking or biking with family supervision. 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 Utilized [employer] with S overall. Gait x125', x75' with close S. Performed RLE clamshells; pt reports not challenging enough. Performed 2x15 sidelying hip abduction straight leg raise BLE. Pt reports RLE hip pain much improved after increased activity. Remained  seated in chair in gym at end of session; OT alerted to pt position for next session.      Therapy Documentation Precautions:  Precautions Precautions: Fall Required Braces or Orthoses: Other Brace/Splint Other Brace/Splint: Breast binder over R chest hematoma Restrictions Weight Bearing Restrictions: No   See Function Navigator for Current Functional Status.   Therapy/Group: Individual Therapy  Luberta Mutter 04/02/2017, 9:15 AM

## 2017-04-02 NOTE — Evaluation (Addendum)
Recreational Therapy Assessment and Plan  Patient Details  Name: Bryan Carney MRN: [id number] Date of Birth: January 09, 1941 Today's Date: 04/02/2017  Rehab Potential:  Good ELOS:   discahrge 10/12  Assessment  Problem List:      Patient Active Problem List   Diagnosis Date Noted  . Debility 03/27/2017  . Chest wall hematoma, right, sequela   . AKI (acute kidney injury) ([place])   . Leukocytosis   . Delirium   . Clot   . Hypoxia   . Lethargic   . Acute delirium   . Coronary artery disease involving native coronary artery of native heart without angina pectoris   . Prostate cancer ([place])   . Paroxysmal atrial fibrillation ([facility])   . Coagulopathy ([place])   . Acute blood loss anemia 03/20/2017  . Hematoma 03/20/2017  . Hyponatremia 03/20/2017  . Supratherapeutic INR 03/20/2017  . Essential hypertension 07/07/2014  . Hyperlipidemia 07/07/2014  . Coronary artery disease due to lipid rich plaque 07/07/2014  . Carotid artery disease ([place]) 07/07/2014  . Type 2 diabetes mellitus without complication, without long-term current use of insulin ([place]) 07/07/2014  . Long term (current) use of anticoagulants 10/14/2012  . Acute pulmonary embolism ([place]) 10/14/2012    Past Medical History:      Past Medical History:  Diagnosis Date  . CAD (coronary artery disease)   . Diabetes ([place])   . Hx pulmonary embolism   . Hyperlipidemia   . Hypertension   . Prostate cancer ([place])   . PVOD (pulmonary veno-occlusive disease) ([place])   . Squamous cell carcinoma    Past Surgical History:       Past Surgical History:  Procedure Laterality Date  . BACK SURGERY    . CARDIAC CATHETERIZATION  10/15/1997   Recommend complete revascularization by CABG  . CARDIOVASCULAR STRESS TEST  06/12/2012   No evidence of ischemia  . CAROTID DOPPLER  08/27/2012   Rt bulb/proximal ICA demonstrated a mild-moderate amount of fibrous plaque without evidence of a significant diameter reduction  or any other vascular abnormality.  . CAROTID ENDARTERECTOMY Left 02/23/1995  . CORONARY ARTERY BYPASS GRAFT  10/19/1997   x5. LIMA-LAD, SVG-PDA, SVG-OM1, seq SVG-fist and second branches of ramus intermedius.    Assessment & Plan Clinical Impression: Bryan Carney a 76 y.o.malewith history of CAD/PVD, PE-on chronic coumadin, T2DM, prostate cancer, who was admitted on 03/20/17 with syncope, significant enlargement of right chest hematoma and weakness. History taken from chart review, patient, and family. Coumadin reversed with FFP and Vitamin K and he was transfused with 2 units PRBC for Hgb of 6.4. CT chest without signs of extravasation and not need for angiogram per IVR. No surgical intervention needed per IR. Bryan Carney and he was started on IV Cardizem to help manage tachycardia. Hospital course significant for A fib with RVR, diffuse weakness with tremors, difficulty following commands fluctuating bouts of lethargy, labile BP as well as acute kidney injury. Repeat head CT 10/1 wasunremarkable for acute process and delirium resolving. He has had edema RUE and dopplers positive for acute superficial thrombosis in cephalic vein at level of antecubital fossa. As H/H has been relatively stable, he was cleared to start Eliquis on 10/2 with recommendations to follow CBC closely and resume ASA in the future. On later evaluation, patient noted to be lethargic with daughter and wife reporting decline in cognition since this morning. His oxygen was d/ced this morning as well and O2 Sats noted to be around 92 on eval. He continues  to have deficits in mobility and ability to carry out ADL tasks. CIR recommended for follow up therapy. Patient transferred to CIR on 03/27/2017 .   Pt presents with decreased activity tolerance, decreased functional mobility, decreased balance, decreased safety, delayed processing, decreased memory Limiting pt's independence with leisure/community pursuits.   Plan  Min 1 TR  session for community reintegration >25 minutes during LOS  Recommendations for other services: None   Discharge Criteria: Patient will be discharged from TR if patient refuses treatment 3 consecutive times without medical reason.  If treatment goals not met, if there is a change in medical status, if patient makes no progress towards goals or if patient is discharged from hospital.  The above assessment, treatment plan, treatment alternatives and goals were discussed and mutually agreed upon: by patient   Met with pt to discuss the purpose of community reintegration/outing including potential goals.  Pt agreeable to participate in an outing to be scheduled tomorrow.  Bryan Carney 04/02/2017, 3:43 PM

## 2017-04-02 NOTE — Progress Notes (Signed)
[facility] PHYSICAL MEDICINE & REHABILITATION     PROGRESS NOTE    Subjective/Complaints: No new issues. Feels well this morning. Slept ok  ROS: pt denies nausea, vomiting, diarrhea, cough, shortness of breath or chest pain   Objective: Vital Signs: Blood pressure (!) 155/81, pulse 71, temperature 98.8 F (37.1 C), temperature source Oral, resp. rate 16, weight 57.4 kg (126 lb 9.9 oz), SpO2 96 %. Dg Chest 2 View  Result Date: 04/01/2017 CLINICAL DATA:  Weakness and shortness of breath, follow-up possible right-sided pneumonia. EXAM: CHEST  2 VIEW COMPARISON:  Chest x-ray of March 27, 2017 FINDINGS: The lungs are borderline hypoinflated. There is minimal linear density at the right lung base compatible with subsegmental atelectasis. No alveolar infiltrate is noted on the right or left. There is no large pleural effusion. The cardiac silhouette is mildly enlarged. The pulmonary vascularity is not engorged. The patient has undergone previous CABG. There is calcification in the wall of the aortic arch. IMPRESSION: No discrete pneumonia. No pulmonary edema nor large pleural effusions. Minimal density at the right lung base suggests atelectasis. A PA and lateral chest x-ray would be useful if the patient can undergo the procedure. Previous CABG. Thoracic aortic atherosclerosis. Electronically Signed   By: David  Martinique M.D.   On: 04/01/2017 07:33   No results for input(s): WBC, HGB, HCT, PLT in the last 72 hours. No results for input(s): NA, K, CL, GLUCOSE, BUN, CREATININE, CALCIUM in the last 72 hours.  Invalid input(s): CO CBG (last 3)   Recent Labs  04/01/17 1652 04/01/17 2050 04/02/17 0626  GLUCAP 172* 171* 144*    Wt Readings from Last 3 Encounters:  04/02/17 57.4 kg (126 lb 9.9 oz)  03/23/17 64.9 kg (143 lb 1.3 oz)  03/18/17 55.3 kg (122 lb)    Physical Exam:  Constitutional: He is oriented to person, place, and time. He appears well-developed and well-nourished .  HENT:   Head: Normocephalic and atraumatic.  Eyes: Pupils are equal, round, and reactive to light. Conjunctivae and EOM are normal.  Neck: Normal range of motion. Neck supple.  Cardiovascular: RRR without murmur. No JVD  .   Respiratory: CTA Bilaterally without wheezes or rales. Normal effort   GI: BS +, non-tender, non-distended .  Musculoskeletal: He exhibits no tenderness.  Min edema right biceps with resolving ecchymosis and hematoma. --stable  Neurological: alert. HOH.  Oriented to self, hospital. Follows all commands Motor: 4-/5 prox to distal in all 4 limbs. Senses pain in all 4's.  Skin: Skin is warm and dry. He is not diaphoretic.  Right chest wall hematoma decreasing  Psychiatric: His affect is pleasant  Assessment/Plan: 1. Functional deficits secondary to debility/delirium which require 3+ hours per day of interdisciplinary therapy in a comprehensive inpatient rehab setting. Physiatrist is providing close team supervision and 24 hour management of active medical problems listed below. Physiatrist and rehab team continue to assess barriers to discharge/monitor patient progress toward functional and medical goals.  Function:  Bathing Bathing position   Position: Wheelchair/chair at sink  Bathing parts Body parts bathed by patient: Right arm, Left arm, Chest, Abdomen, Front perineal area, Buttocks, Right upper leg, Left upper leg, Right lower leg, Left lower leg Body parts bathed by helper: Back  Bathing assist Assist Level: Supervision or verbal cues      Upper Body Dressing/Undressing Upper body dressing   What is the patient wearing?: Pull over shirt/dress     Pull over shirt/dress - Perfomed by patient: Thread/unthread  right sleeve, Thread/unthread left sleeve, Put head through opening, Pull shirt over trunk   Button up shirt - Perfomed by patient: Pull shirt around back, Thread/unthread left sleeve, Thread/unthread right sleeve, Button/unbutton shirt Button up shirt -  Perfomed by helper: Thread/unthread right sleeve, Pull shirt around back, Button/unbutton shirt    Upper body assist Assist Level: Set up      Lower Body Dressing/Undressing Lower body dressing   What is the patient wearing?: Underwear, Pants, Socks, Shoes Underwear - Performed by patient: Thread/unthread left underwear leg, Pull underwear up/down, Thread/unthread right underwear leg Underwear - Performed by helper: Thread/unthread right underwear leg Pants- Performed by patient: Thread/unthread right pants leg, Thread/unthread left pants leg, Pull pants up/down     Non-skid slipper socks- Performed by helper: Don/doff right sock, Don/doff left sock Socks - Performed by patient: Don/doff right sock, Don/doff left sock   Shoes - Performed by patient: Don/doff right shoe, Don/doff left shoe, Fasten right, Fasten left            Lower body assist Assist for lower body dressing: Supervision or verbal cues      Toileting Toileting   Toileting steps completed by patient: Adjust clothing prior to toileting, Performs perineal hygiene, Adjust clothing after toileting Toileting steps completed by helper: Adjust clothing after toileting Toileting Assistive Devices: Grab bar or rail  Toileting assist Assist level: Touching or steadying assistance (Pt.75%)   Transfers Chair/bed transfer   Chair/bed transfer method: Stand pivot Chair/bed transfer assist level: Touching or steadying assistance (Pt > 75%) Chair/bed transfer assistive device: Armrests, Bedrails     Locomotion Ambulation     Max distance: 125 Assist level: Touching or steadying assistance (Pt > 75%)   Wheelchair          Cognition Comprehension Comprehension assist level: Understands basic 75 - 89% of the time/ requires cueing 10 - 24% of the time  Expression Expression assist level: Expresses basic 75 - 89% of the time/requires cueing 10 - 24% of the time. Needs helper to occlude trach/needs to repeat words.   Social Interaction Social Interaction assist level: Interacts appropriately 75 - 89% of the time - Needs redirection for appropriate language or to initiate interaction.  Problem Solving Problem solving assist level: Solves basic 75 - 89% of the time/requires cueing 10 - 24% of the time  Memory Memory assist level: Recognizes or recalls 75 - 89% of the time/requires cueing 10 - 24% of the time  Medical Problem List and Plan: 1.  Limitations with mobility, weakness, and ability to complete ADLs secondary to debility and delirium.  -continue therapies.    -team conference today 2.  PE/Superfical DVT RUE cephalic vein/anticoagulation:  -Eliquis 3. Pain Management: tylenol prn 4. Mood: LCSW to follow for evaluation and support.  5. Neuropsych: This patient is not fully capable of making decisions on his own behalf. 6. Skin/Wound Care: Pressure relief measures.  7. Fluids/Electrolytes/Nutrition: Monitor I/O.   -BUN sl elevated still on Saturday Pushing po  -follow up labs tomorrow 8. Right chest hematoma with ABLA: H/H stable.  CBC q 3 days to monitor for stability as anticoagulation resumed.  9. CAD/PAF: RVR has resolved. On Benazepril, diltiazem and atorvastatin. Hold ASA for now.   monitor HR bid 10.HTN: Monitor BP bid. Continues to be labile will monitor bid and titrate medications as indicated.  11. Acute kidney injury: SCr improved from 1.52--> 1.32. Continue to encourage fluid intake and avoid nephrotoxic mediations.  12. Leukocytosis:  Encourage IS  with flutter valve -likely aspiration pneumonia    -follow up CXR personally reviewed and left lower lobe consolidation isn't evident.   -wbc's down to 10.7 on 10/6  -chest generally clear, afebrile, no cough. Handling diet well  -now off zosyn 13. T2DM: Was on Kombiglyze 10/998 daily. No metformin due to AKI--question CKD?    -continue low dose a Trajenta.   -increase amaryl to 4mg  daily--   .Continue SSI for elevated BS.  -dc lantus 14.  Hyponatremia: sodium holding at 133.    15. Delirium: improving .     LOS (Days) 6 A FACE TO FACE EVALUATION WAS PERFORMED  Meredith Staggers, MD 04/02/2017 9:08 AM

## 2017-04-02 NOTE — Progress Notes (Signed)
Occupational Therapy Session Note  Patient Details  Name: Bryan Carney MRN: [id number] Date of Birth: 1940/07/04  Today's Date: 04/02/2017 OT Individual Time: 1017-5102 OT Individual Time Calculation (min): 45 min    Short Term Goals: Week 1:  OT Short Term Goal 1 (Week 1): Pt will engaged in 10 minutes of functional activity with less than 2 rest breaks. OT Short Term Goal 2 (Week 1): Pt will perform toilet transfer with min A.  OT Short Term Goal 3 (Week 1): Pt will perform LB dressing with min A.  OT Short Term Goal 4 (Week 1): Pt will perform bathing from shower level with min A.  Week 2:     Skilled Therapeutic Interventions/Progress Updates:    1:1 Self care retraining at shower level. Pt received from gym and was able to ambulated all the way back to his room without rest. Pt needed to rest in bed before bathing at shower level. Pt able to shower with setup and distant supervision. Pt able to perform one limb stance with one Ue support on grab bar to doff pants and dry LEs after showering. Pt able to dress EOB with supervision.   Returned to supine to rest at end of session.  Therapy Documentation Precautions:  Precautions Precautions: Fall Required Braces or Orthoses: Other Brace/Splint Other Brace/Splint: Breast binder over R chest hematoma Restrictions Weight Bearing Restrictions: No Pain: Pain Assessment Pain Assessment: No/denies pain  See Function Navigator for Current Functional Status.   Therapy/Group: Individual Therapy  Willeen Cass [facility] 04/02/2017, 11:52 AM

## 2017-04-02 NOTE — Progress Notes (Signed)
Speech Language Pathology Daily Session Note  Patient Details  Name: Bryan Carney MRN: [id number] Date of Birth: [birth year]  Today's Date: 04/02/2017 SLP Individual Time: 5284-1324 SLP Individual Time Calculation (min): 27 min  Short Term Goals: Week 1: SLP Short Term Goal 1 (Week 1): Pt will utilize external memory aids to recall new information with Mod A cues.  SLP Short Term Goal 2 (Week 1): Pt will sustain attention to task for ~ 5 minutes with Mod A cues.  SLP Short Term Goal 3 (Week 1): Pt will complete mildly complex problem solving task with Min A cues.  SLP Short Term Goal 4 (Week 1): Pt will respond to mildly complex yes/no questions in timely manner, with 90% accuracy and superviison cues for timeliness of response.  SLP Short Term Goal 5 (Week 1): Pt will demonstrate intellectual awareness by listing 2 physical and 2 cognitive deficits that are acute with Min A cues.   Skilled Therapeutic Interventions: Skilled ST services focused on cognitive goals. SLP facilitated mild complex  Yes/ no questions from problem solving task with supervision A question cues. Pt demonstrated ability to recall yesterday's session actitvties in detail with Mod I. SLP suspect's yesterday's results to joking personality and today better reflects pt's ability. Pt was left in room with call bell within reach. Recommend to continue ST services.      Function:  Eating Eating   Modified Consistency Diet: No Eating Assist Level: No help, No cues           Cognition Comprehension Comprehension assist level: Understands basic 75 - 89% of the time/ requires cueing 10 - 24% of the time  Expression   Expression assist level: Expresses basic 90% of the time/requires cueing < 10% of the time.  Social Interaction Social Interaction assist level: Interacts appropriately 90% of the time - Needs monitoring or encouragement for participation or interaction.  Problem Solving Problem solving assist level: Solves  basic 90% of the time/requires cueing < 10% of the time  Memory Memory assist level: Recognizes or recalls 90% of the time/requires cueing < 10% of the time    Pain Pain Assessment Pain Assessment: No/denies pain  Therapy/Group: Individual Therapy  Ariauna Farabee  [facility] 04/02/2017, 2:34 PM

## 2017-04-03 ENCOUNTER — Encounter (HOSPITAL_COMMUNITY): Payer: Medicare Other | Admitting: *Deleted

## 2017-04-03 ENCOUNTER — Inpatient Hospital Stay (HOSPITAL_COMMUNITY): Payer: Medicare Other | Admitting: Physical Therapy

## 2017-04-03 ENCOUNTER — Inpatient Hospital Stay (HOSPITAL_COMMUNITY): Payer: Medicare Other

## 2017-04-03 ENCOUNTER — Inpatient Hospital Stay (HOSPITAL_COMMUNITY): Payer: Medicare Other | Admitting: Speech Pathology

## 2017-04-03 LAB — GLUCOSE, CAPILLARY
GLUCOSE-CAPILLARY: 131 mg/dL — AB (ref 65–99)
GLUCOSE-CAPILLARY: 253 mg/dL — AB (ref 65–99)
Glucose-Capillary: 168 mg/dL — ABNORMAL HIGH (ref 65–99)
Glucose-Capillary: 195 mg/dL — ABNORMAL HIGH (ref 65–99)

## 2017-04-03 LAB — CBC
HCT: 29.6 % — ABNORMAL LOW (ref 39.0–52.0)
Hemoglobin: 9.4 g/dL — ABNORMAL LOW (ref 13.0–17.0)
MCH: 30.6 pg (ref 26.0–34.0)
MCHC: 31.8 g/dL (ref 30.0–36.0)
MCV: 96.4 fL (ref 78.0–100.0)
Platelets: 502 10*3/uL — ABNORMAL HIGH (ref 150–400)
RBC: 3.07 MIL/uL — ABNORMAL LOW (ref 4.22–5.81)
RDW: 14.4 % (ref 11.5–15.5)
WBC: 9.2 10*3/uL (ref 4.0–10.5)

## 2017-04-03 LAB — BASIC METABOLIC PANEL
Anion gap: 9 (ref 5–15)
BUN: 25 mg/dL — AB (ref 6–20)
CALCIUM: 8.8 mg/dL — AB (ref 8.9–10.3)
CHLORIDE: 102 mmol/L (ref 101–111)
CO2: 24 mmol/L (ref 22–32)
Creatinine, Ser: 1.41 mg/dL — ABNORMAL HIGH (ref 0.61–1.24)
GFR, EST AFRICAN AMERICAN: 54 mL/min — AB (ref >60)
GFR, EST NON AFRICAN AMERICAN: 47 mL/min — AB (ref >60)
GLUCOSE: 132 mg/dL — AB (ref 65–99)
Potassium: 4 mmol/L (ref 3.5–5.1)
Sodium: 135 mmol/L (ref 135–145)

## 2017-04-03 NOTE — Progress Notes (Signed)
Recreational Therapy Session Note  Patient Details  Name: Bryan Carney MRN: [id number] Date of Birth: 03/29/1941 Today's Date: 04/03/2017  Pain: no c/o Skilled Therapeutic Interventions/Progress Updates: Pt participated in community reintegration/outing to [employer] at overall supervision ambulatory level without AD.  Goals focused on safe community mobility, identification & negotiation of obstacles, accessing public restroom, energy conservation techniques/education.  See outing goal sheet in shadow chart for full details.   [place] 04/03/2017, 8:14 AM

## 2017-04-03 NOTE — Progress Notes (Signed)
Occupational Therapy Session Note  Patient Details  Name: Bryan Carney MRN: [id number] Date of Birth: May 19, 1941  Today's Date: 04/03/2017 OT Individual Time: 1015-1045 OT Individual Time Calculation (min): 30 min    Short Term Goals: Week 1:  OT Short Term Goal 1 (Week 1): Pt will engaged in 10 minutes of functional activity with less than 2 rest breaks. OT Short Term Goal 2 (Week 1): Pt will perform toilet transfer with min A.  OT Short Term Goal 3 (Week 1): Pt will perform LB dressing with min A.  OT Short Term Goal 4 (Week 1): Pt will perform bathing from shower level with min A.   Skilled Therapeutic Interventions/Progress Updates:    Pt engaged in ongoing BADL retaining including bathing at shower level and dressing with sit<>stand from EOB.  Pt completed bathing tasks while standing at sink with no LOB.  Pt returned to room and completed dressing tasks with no LOB including threading underpants while standing.  Pt completed all tasks at supervision level with min verbal cues for safety awareness.    Therapy Documentation Precautions:  Precautions Precautions: Fall Required Braces or Orthoses: Other Brace/Splint Other Brace/Splint: Breast binder over R chest hematoma Restrictions Weight Bearing Restrictions: No  Pain: Pain Assessment Pain Assessment: No/denies pain  See Function Navigator for Current Functional Status.   Therapy/Group: Individual Therapy  Leroy Libman 04/03/2017, 10:48 AM

## 2017-04-03 NOTE — Progress Notes (Signed)
[facility]     PROGRESS NOTE    Subjective/Complaints: No new complaints. Slept well  ROS: pt denies nausea, vomiting, diarrhea, cough, shortness of breath or chest pain   Objective: Vital Signs: Blood pressure (!) 149/54, pulse 98, temperature 98.2 F (36.8 C), temperature source Oral, resp. rate 16, weight 57.3 kg (126 lb 3.6 oz), SpO2 98 %. No results found.  Recent Labs  04/03/17 0617  WBC 9.2  HGB 9.4*  HCT 29.6*  PLT 502*    Recent Labs  04/03/17 0617  NA 135  K 4.0  CL 102  GLUCOSE 132*  BUN 25*  CREATININE 1.41*  CALCIUM 8.8*   CBG (last 3)   Recent Labs  04/02/17 1653 04/02/17 2117 04/03/17 0623  GLUCAP 164* 80 131*    Wt Readings from Last 3 Encounters:  04/03/17 57.3 kg (126 lb 3.6 oz)  03/23/17 64.9 kg (143 lb 1.3 oz)  03/18/17 55.3 kg (122 lb)    Physical Exam:  Constitutional: He is oriented to person, place, and time. He appears well-developed and well-nourished .  HENT:  Head: Normocephalic and atraumatic.  Eyes: Pupils are equal, round, and reactive to light. Conjunctivae and EOM are normal.  Neck: Normal range of motion. Neck supple.  Cardiovascular: RRR without murmur. No JVD  .   Respiratory: CTA Bilaterally without wheezes or rales. Normal effort   GI: BS +, non-tender, non-distended .  Musculoskeletal: He exhibits no tenderness.  Min edema right biceps with resolving ecchymosis and hematoma. --stable  Neurological: alert. HOH.  Oriented to self, hospital. Follows all commands Motor: 4-/5 prox to distal in all 4 limbs. Senses pain in all 4's.  Skin: Skin is warm and dry. He is not diaphoretic.  Right chest wall hematoma decreasing  Psychiatric: His affect is pleasant  Assessment/Plan: 1. Functional deficits secondary to debility/delirium which require 3+ hours per day of interdisciplinary therapy in a comprehensive inpatient rehab setting. Physiatrist is providing close team supervision  and 24 hour management of active medical problems listed below. Physiatrist and rehab team continue to assess barriers to discharge/monitor patient progress toward functional and medical goals.  Function:  Bathing Bathing position   Position: Wheelchair/chair at sink  Bathing parts Body parts bathed by patient: Right arm, Left arm, Chest, Abdomen, Front perineal area, Buttocks, Right upper leg, Left upper leg, Right lower leg, Left lower leg Body parts bathed by helper: Back  Bathing assist Assist Level: Supervision or verbal cues      Upper Body Dressing/Undressing Upper body dressing   What is the patient wearing?: Pull over shirt/dress     Pull over shirt/dress - Perfomed by patient: Thread/unthread right sleeve, Thread/unthread left sleeve, Put head through opening, Pull shirt over trunk   Button up shirt - Perfomed by patient: Pull shirt around back, Thread/unthread left sleeve, Thread/unthread right sleeve, Button/unbutton shirt Button up shirt - Perfomed by helper: Thread/unthread right sleeve, Pull shirt around back, Button/unbutton shirt    Upper body assist Assist Level: Set up      Lower Body Dressing/Undressing Lower body dressing   What is the patient wearing?: Underwear, Pants, Socks, Shoes Underwear - Performed by patient: Thread/unthread left underwear leg, Pull underwear up/down, Thread/unthread right underwear leg Underwear - Performed by helper: Thread/unthread right underwear leg Pants- Performed by patient: Thread/unthread right pants leg, Thread/unthread left pants leg, Pull pants up/down     Non-skid slipper socks- Performed by helper: Don/doff right sock, Don/doff left sock Socks -  Performed by patient: Don/doff right sock, Don/doff left sock   Shoes - Performed by patient: Don/doff right shoe, Don/doff left shoe, Fasten right, Fasten left            Lower body assist Assist for lower body dressing: Supervision or verbal cues       Toileting Toileting   Toileting steps completed by patient: Adjust clothing prior to toileting, Performs perineal hygiene, Adjust clothing after toileting Toileting steps completed by helper: Adjust clothing after toileting Toileting Assistive Devices: Grab bar or rail  Toileting assist Assist level: Touching or steadying assistance (Pt.75%)   Transfers Chair/bed transfer   Chair/bed transfer method: Stand pivot Chair/bed transfer assist level: Touching or steadying assistance (Pt > 75%) Chair/bed transfer assistive device: Armrests, Bedrails     Locomotion Ambulation     Max distance: 150 Assist level: Touching or steadying assistance (Pt > 75%)   Wheelchair          Cognition Comprehension Comprehension assist level: Understands basic 75 - 89% of the time/ requires cueing 10 - 24% of the time  Expression Expression assist level: Expresses basic 90% of the time/requires cueing < 10% of the time.  Social Interaction Social Interaction assist level: Interacts appropriately 90% of the time - Needs monitoring or encouragement for participation or interaction.  Problem Solving Problem solving assist level: Solves basic 90% of the time/requires cueing < 10% of the time  Memory Memory assist level: Recognizes or recalls 90% of the time/requires cueing < 10% of the time  Medical Problem List and Plan: 1.  Limitations with mobility, weakness, and ability to complete ADLs secondary to debility and delirium.  -continue therapies.    -progressing toward goals 2.  PE/Superfical DVT RUE cephalic vein/anticoagulation:  -Eliquis 3. Pain Management: tylenol prn 4. Mood: LCSW to follow for evaluation and support.  5. Neuropsych: This patient is not fully capable of making decisions on his own behalf. 6. Skin/Wound Care: Pressure relief measures.  7. Fluids/Electrolytes/Nutrition: Monitor I/O.   -BUN improved  -continue to push PO 8. Right chest hematoma with ABLA: H/H stable.  CBC q 3 days  to monitor for stability as anticoagulation resumed.  9. CAD/PAF: RVR has resolved. On Benazepril, diltiazem and atorvastatin. Hold ASA for now.   monitor HR bid 10.HTN: Monitor BP bid. Continues to be labile will monitor bid and titrate medications as indicated.  11. Acute kidney injury: SCr improved from 1.52--> 1.32. Continue to encourage fluid intake and avoid nephrotoxic mediations.  12. Leukocytosis:  Encourage IS with flutter valve -likely aspiration pneumonia    -follow up CXR personally reviewed and left lower lobe consolidation isn't evident.   -wbc's down to 9.2 today  -chest generally clear, afebrile, no cough. Handling diet well  -now off zosyn 13. T2DM: Was on Kombiglyze 10/998 daily. No metformin due to AKI--question CKD?    -continue low dose a Trajenta.   -increased amaryl to 4mg  daily on 10/9  .Continue SSI for elevated BS.  -off lantus 14. Hyponatremia: sodium holding at 133.    15. Delirium: improving .     LOS (Days) 7 A FACE TO FACE EVALUATION WAS PERFORMED  Meredith Staggers, MD 04/03/2017 9:02 AM

## 2017-04-03 NOTE — Progress Notes (Signed)
Recreational Therapy Discharge Summary Patient Details  Name: Bryan Carney MRN: [id number] Date of Birth: 12/19/1940 Today's Date: 04/03/2017  Comments on progress toward goals: Pt has made great progress toward goals and is ready for discharge with family.  Pt participated in community reintegration/outing at overall supervision ambulatory level without AD.  Goals/education focused on safe community mobility, negotiation of obstacles, accessing public restroom, and energy conservation.  Goals met.  Reasons for discharge: treatment goals met  Patient/family agrees with progress made and goals achieved: Yes  [name] 04/03/2017, 2:37 PM

## 2017-04-03 NOTE — Progress Notes (Addendum)
Occupational Therapy Session Note  Patient Details  Name: Bryan Carney MRN: [id number] Date of Birth: 1941-06-16  Today's Date: 04/03/2017 OT Con-current Time: 0301-3143 OT con-current  Time Calculation (min): 102 min    Short Term Goals: Week 1:  OT Short Term Goal 1 (Week 1): Pt will engaged in 10 minutes of functional activity with less than 2 rest breaks. OT Short Term Goal 2 (Week 1): Pt will perform toilet transfer with min A.  OT Short Term Goal 3 (Week 1): Pt will perform LB dressing with min A.  OT Short Term Goal 4 (Week 1): Pt will perform bathing from shower level with min A.   Skilled Therapeutic Interventions/Progress Updates:    Pt seen for community outing to [employer]. Ambulation completed at supervision level in community setting. He demonstrated good functional activity tolerance and safety awareness throughout outing. CGA for steps leading into rehab van. All goals set for outing met, see sheet in shadow chart for goal details. Pt returned to room at end of session, left with all needs in reach.  Education provided throughout session regarding energy conservation, prioritizing tasks, safety awareness, return to PLOF, and d/c planning.  Therapy Documentation Precautions:  Precautions Precautions: Fall Required Braces or Orthoses: Other Brace/Splint Other Brace/Splint: Breast binder over R chest hematoma Restrictions Weight Bearing Restrictions: No Pain:   No/ denies pain  See Function Navigator for Current Functional Status.   Therapy/Group: Con-current Therapy  Lewis, Tomika Eckles C 04/03/2017, 7:30 AM

## 2017-04-03 NOTE — Progress Notes (Signed)
Physical Therapy Session Note  Patient Details  Name: Bryan Carney MRN: [id number] Date of Birth: December 12, 1940  Today's Date: 04/03/2017 PT Individual Time: 0800-0830 PT Individual Time Calculation (min): 30 min   Short Term Goals: Week 1:  PT Short Term Goal 1 (Week 1): Pt will demonstrate bed mobility with S PT Short Term Goal 2 (Week 1): pt will demonstrate stand pivot transfers with min guard PT Short Term Goal 3 (Week 1): Pt will demonstrate gait x150' with min guard PT Short Term Goal 4 (Week 1): Pt will demonstrate ascent/descent four 6" stairs with B handrails and minA  Skilled Therapeutic Interventions/Progress Updates: Pt received seated in bed, denies pain and agreeable to treatment. Supine>sit modI. Pt dons shoes modI and increased time. Gait to gym with min guard; performed R/L and up/down head turns to reduce visual input and facilitate sensory reorganization. Required min guard d/t occasional staggering with R/L head turns. Stairs performed x16 total; first four steps up/down pt performed without UE support on rails and min guard. Encouraged pt to use one handrail for safety; remaining steps performed with close S. Standing balance progressing in parallel bars on airex foam pad; normal BOS eyes open/closed, narrow BOS eyes open/closed. Pt with posterior LOBs in narrow stance with eyes closed requiring minA and use of UEs to recover. Educated pt on goal of task and importance of ankle strategy for correcting low velocity LOBs. Gait to return to room performing sidestepping R/L and backwards walking. One major LOB when sidestepping to L side and pt unable to catch himself; modA to correct. Used restroom with S; remained seated in w/c at end of session with setup to eat breakfast, all needs in reach.      Therapy Documentation Precautions:  Precautions Precautions: Fall Required Braces or Orthoses: Other Brace/Splint Other Brace/Splint: Breast binder over R chest  hematoma Restrictions Weight Bearing Restrictions: No   See Function Navigator for Current Functional Status.   Therapy/Group: Individual Therapy  Luberta Mutter 04/03/2017, 8:21 AM

## 2017-04-03 NOTE — Progress Notes (Signed)
Speech Language Pathology Daily Session Note  Patient Details  Name: Bryan Carney MRN: [id number] Date of Birth: 1940-08-17  Today's Date: 04/03/2017 SLP Individual Time: 60-1445 SLP Individual Time Calculation (min): 30 min  Short Term Goals: Week 1: SLP Short Term Goal 1 (Week 1): Pt will utilize external memory aids to recall new information with Mod A cues.  SLP Short Term Goal 2 (Week 1): Pt will sustain attention to task for ~ 5 minutes with Mod A cues.  SLP Short Term Goal 3 (Week 1): Pt will complete mildly complex problem solving task with Min A cues.  SLP Short Term Goal 4 (Week 1): Pt will respond to mildly complex yes/no questions in timely manner, with 90% accuracy and superviison cues for timeliness of response.  SLP Short Term Goal 5 (Week 1): Pt will demonstrate intellectual awareness by listing 2 physical and 2 cognitive deficits that are acute with Min A cues.   Skilled Therapeutic Interventions: Skilled treatment session focused on cognition goals. SLP facilitated session by providing supervision cues to recall outing earlier in morning. Pt able to sustain attention to task for ~ 20 minutes with supervision cues. Pt used call bell to request nurse to help with toileting, indicating increased awareness of deficits. Pt with decreased processing times and able to answer all questions in timely manner. Pt was handed off to nursing for toileting. Continue per current plan of care.      Function:    Cognition Comprehension Comprehension assist level: Understands basic 75 - 89% of the time/ requires cueing 10 - 24% of the time  Expression   Expression assist level: Expresses complex 90% of the time/cues < 10% of the time  Social Interaction Social Interaction assist level: Interacts appropriately 90% of the time - Needs monitoring or encouragement for participation or interaction.  Problem Solving Problem solving assist level: Solves basic 75 - 89% of the time/requires  cueing 10 - 24% of the time;Solves basic 90% of the time/requires cueing < 10% of the time  Memory Memory assist level: Recognizes or recalls 90% of the time/requires cueing < 10% of the time    Pain    Therapy/Group: Individual Therapy  Bryan Carney 04/03/2017, 2:35 PM

## 2017-04-04 ENCOUNTER — Inpatient Hospital Stay (HOSPITAL_COMMUNITY): Payer: Medicare Other | Admitting: Physical Therapy

## 2017-04-04 ENCOUNTER — Inpatient Hospital Stay (HOSPITAL_COMMUNITY): Payer: Medicare Other

## 2017-04-04 ENCOUNTER — Inpatient Hospital Stay (HOSPITAL_COMMUNITY): Payer: Medicare Other | Admitting: Speech Pathology

## 2017-04-04 DIAGNOSIS — D62 Acute posthemorrhagic anemia: Secondary | ICD-10-CM

## 2017-04-04 LAB — GLUCOSE, CAPILLARY
GLUCOSE-CAPILLARY: 172 mg/dL — AB (ref 65–99)
GLUCOSE-CAPILLARY: 212 mg/dL — AB (ref 65–99)
Glucose-Capillary: 138 mg/dL — ABNORMAL HIGH (ref 65–99)
Glucose-Capillary: 88 mg/dL (ref 65–99)

## 2017-04-04 MED ORDER — ASPIRIN EC 325 MG PO TBEC
325.0000 mg | DELAYED_RELEASE_TABLET | Freq: Every day | ORAL | Status: DC
  Administered 2017-04-05: 325 mg via ORAL
  Filled 2017-04-04: qty 1

## 2017-04-04 MED ORDER — SIMVASTATIN 20 MG PO TABS
20.0000 mg | ORAL_TABLET | Freq: Every day | ORAL | Status: DC
  Administered 2017-04-04: 20 mg via ORAL
  Filled 2017-04-04: qty 1

## 2017-04-04 MED ORDER — ASPIRIN EC 81 MG PO TBEC: 81.0000 mg | DELAYED_RELEASE_TABLET | Freq: Every day | ORAL | Status: DC

## 2017-04-04 NOTE — Progress Notes (Signed)
Occupational Therapy Session Note  Patient Details  Name: Bryan Carney MRN: [id number] Date of Birth: 02-25-1941  Today's Date: 04/04/2017 OT Individual Time: 6063-0160 OT Individual Time Calculation (min): 75 min    Short Term Goals: Week 1:  OT Short Term Goal 1 (Week 1): Pt will engaged in 10 minutes of functional activity with less than 2 rest breaks. OT Short Term Goal 2 (Week 1): Pt will perform toilet transfer with min A.  OT Short Term Goal 3 (Week 1): Pt will perform LB dressing with min A.  OT Short Term Goal 4 (Week 1): Pt will perform bathing from shower level with min A.   Skilled Therapeutic Interventions/Progress Updates:    Pt resting in bed upon arrival and agreeable to engaging in BADLs this morning.  Pt amb in room without AD to gather clothing and supplies before amb into bathroom to use toilet and take shower.  Pt completed shower while standing in shower.  Pt completed dressing tasks with sit<>stand from chair.  Pt amb in room to gather soiled clothing and linens before walking to day room.  NuStep 10 mins BLE only for 10 mins.  Pt returned to room.   Therapy Documentation Precautions:  Precautions Precautions: Fall Required Braces or Orthoses: Other Brace/Splint Other Brace/Splint: Breast binder over R chest hematoma Restrictions Weight Bearing Restrictions: No Pain: Pain Assessment Pain Assessment: No/denies pain Pain Score: 0-No pain  See Function Navigator for Current Functional Status.   Therapy/Group: Individual Therapy  Leroy Libman 04/04/2017, 9:27 AM

## 2017-04-04 NOTE — Progress Notes (Signed)
Physical Therapy Note  Patient Details  Name: KIJANA CROMIE MRN: [id number] Date of Birth: [birth year] Today's Date: 04/04/2017  1415-1445, 30 min indivual tx Pain: none  Gait on unit without AD on level tile, supervision. Balance retraining standing on wedge during bil UE fine motor task, without trunk or UE support, x 5 minutes without LOB; standing with feet touching on soft Airex mat, x 3 minutes during manipulation of agility cups with bil hands. Pt needed extra time and min cues to problem -solve height differences of cups after some of them got stuck together. Seated rest breaks PRN throughout session.  Advanced gait to return to room, kicking Yoga block alternating R/L foot, with supervision, without LOB.  Min cues for hamstring activation to kick with more power.  Pt left resting in bed with all needs within reach.   See function navigator for current status. Kennady Zimmerle 04/04/2017, 12:43 PM

## 2017-04-04 NOTE — Progress Notes (Signed)
Occupational Therapy Discharge Summary  Patient Details  Name: Bryan Carney MRN: [id number] Date of Birth: [birth year]  Patient has met 11 of 11 long term goals due to improved activity tolerance, improved balance, postural control and ability to compensate for deficits.  Pt made excellent progress with BADLs during this admission.  Pt completes all tasks at supervision/mod I level.  Pt exhibits no unsafe behaviors.  Pt's wife has been present for therapy. Patient to discharge at overall mod I / supervision  level.  Patient's care partner is independent to provide the necessary physical assistance at discharge.     Recommendation:  No f/u at this time  Equipment: recommended shower chair  Reasons for discharge: treatment goals met and discharge from hospital  Patient/family agrees with progress made and goals achieved: Yes  OT Discharge Vision Baseline Vision/History: Wears glasses Wears Glasses: At all times Patient Visual Report: No change from baseline Vision Assessment?: No apparent visual deficits Perception  Perception: Within Functional Limits Praxis Praxis: Intact Cognition Overall Cognitive Status: Within Functional Limits for tasks assessed Arousal/Alertness: Awake/alert Orientation Level: Oriented X4 Attention: Selective Sustained Attention: Appears intact Selective Attention: Appears intact Memory: Appears intact Safety/Judgment: Appears intact Sensation Sensation Light Touch: Appears Intact Stereognosis: Appears Intact Hot/Cold: Appears Intact Proprioception: Appears Intact Coordination Gross Motor Movements are Fluid and Coordinated: Yes Fine Motor Movements are Fluid and Coordinated: Yes Motor  Motor Motor: Within Functional Limits    Trunk/Postural Assessment  Cervical Assessment Cervical Assessment: Within Functional Limits Thoracic Assessment Thoracic Assessment: Within Functional Limits Lumbar Assessment Lumbar Assessment: Within Functional  Limits Postural Control Postural Control: Within Functional Limits  Balance Static Sitting Balance Static Sitting - Level of Assistance: 7: Independent Dynamic Sitting Balance Dynamic Sitting - Level of Assistance: 7: Independent Extremity/Trunk Assessment RUE Assessment RUE Assessment: Within Functional Limits LUE Assessment LUE Assessment: Within Functional Limits   See Function Navigator for Current Functional Status.  Leotis Shames [facility] 04/04/2017, 9:46 AM

## 2017-04-04 NOTE — Plan of Care (Signed)
Problem: RH KNOWLEDGE DEFICIT GENERAL Goal: RH STG INCREASE KNOWLEDGE OF SELF CARE AFTER HOSPITALIZATION With cues/resources/reminders  Outcome: Completed/Met Date Met: 04/04/17 Patient is able to verbalize how to provide self care after d/c independently.

## 2017-04-04 NOTE — Progress Notes (Signed)
Speech Language Pathology Discharge Summary  Patient Details  Name: Bryan Carney MRN: [id number] Date of Birth: 11-11-1940  Today's Date: 04/04/2017 SLP Individual Time: 9987-2158 SLP Individual Time Calculation (min): 30 min   Skilled Therapeutic Interventions:  Skilled treatment session focused on cognition goals. SLP facilitated session by administering Movico version 7.1 with pt obtaining score of 20 out of 30 (n=>26). Pt continues to require supervision cues (intermittent Min A) for completion of semi-complex reasoning tasks, safety awareness and recall of new information. As a result, we continue to recommend 24 hour supervision at discharge with follow-up ST services to target short term memory, complex problem solving, selective attention and safety awareness.     Patient has met 4 of 4 long term goals.  Patient to discharge at overall Supervision level.    Clinical Impression/Discharge Summary:   Pt with progress during ST sessions. As a result, he has met all of his LTGs and is at supervision level of support. Pt continues to be pleasantly unaware of cognitive deficits but is responsive to education on deficits. Recommend HHST to address above mentioned deficits.    Care Partner:  Caregiver Able to Provide Assistance: Yes  Type of Caregiver Assistance: Cognitive  Recommendation:  Home Health SLP;24 hour supervision/assistance  Rationale for SLP Follow Up: Maximize cognitive function and independence;Reduce caregiver burden   Equipment: N/A   Reasons for discharge: Treatment goals met   Patient/Family Agrees with Progress Made and Goals Achieved: Yes   Function:  Eating Eating     Eating Assist Level: No help, No cues           Cognition Comprehension Comprehension assist level: Understands complex 90% of the time/cues 10% of the time;Follows basic conversation/direction with no assist  Expression   Expression assist level: Expresses complex ideas: With extra  time/assistive device  Social Interaction Social Interaction assist level: Interacts appropriately with others with medication or extra time (anti-anxiety, antidepressant).  Problem Solving Problem solving assist level: Solves basic problems with no assist;Solves basic 90% of the time/requires cueing < 10% of the time  Memory Memory assist level: Recognizes or recalls 90% of the time/requires cueing < 10% of the time;Recognizes or recalls 75 - 89% of the time/requires cueing 10 - 24% of the time   Shanen Norris 04/04/2017, 1:41 PM

## 2017-04-04 NOTE — Significant Event (Signed)
Patient refused lipitor. Patient reports he "hurts" in his joints when taking lipitor. Patient requesting zocor. PA Love notified. Continue with plan of care. Bryan Carney

## 2017-04-04 NOTE — Discharge Summary (Signed)
Physician Discharge Summary  Patient ID: Bryan Carney MRN: [id number] DOB/AGE: [birth year] 76 y.o.  Admit date: 03/27/2017 Discharge date: 04/05/2017  Discharge Diagnoses:  Principal Problem:   Debility Active Problems:   Long term (current) use of anticoagulants   Type 2 diabetes mellitus without complication, without long-term current use of insulin (HCC)   Acute blood loss anemia   Paroxysmal atrial fibrillation (HCC)   Acute delirium   Chest wall hematoma, right, sequela   Aspiration pneumonia of left lower lobe due to gastric secretions [facility])   Discharged Condition:  Stable   Chest 2 View  Result Date: 04/01/2017 CLINICAL DATA:  Weakness and shortness of breath, follow-up possible right-sided pneumonia. EXAM: CHEST  2 VIEW COMPARISON:  Chest x-ray of March 27, 2017 FINDINGS: The lungs are borderline hypoinflated. There is minimal linear density at the right lung base compatible with subsegmental atelectasis. No alveolar infiltrate is noted on the right or left. There is no large pleural effusion. The cardiac silhouette is mildly enlarged. The pulmonary vascularity is not engorged. The patient has undergone previous CABG. There is calcification in the wall of the aortic arch. IMPRESSION: No discrete pneumonia. No pulmonary edema nor large pleural effusions. Minimal density at the right lung base suggests atelectasis. A PA and lateral chest x-ray would be useful if the patient can undergo the procedure. Previous CABG. Thoracic aortic atherosclerosis. Electronically Signed   By: David  Martinique M.D.   On: 04/01/2017 07:33   Dg Chest 2 View  Result Date: 03/27/2017 CLINICAL DATA:  Atelectasis. EXAM: CHEST  2 VIEW COMPARISON:  Chest x-ray dated March 23, 2017. FINDINGS: Postsurgical changes related to prior CABG. Mild cardiomegaly. Normal pulmonary vascularity. Low lung volumes. Patchy opacities in the right lower lobe. Trace pleural effusions bilaterally. No pneumothorax or  consolidation. No acute osseous abnormality. IMPRESSION: 1. Patchy opacities in the right lower lobe which may represent atelectasis or pneumonia. 2. Mild cardiomegaly with trace bilateral pleural effusions. Electronically Signed   By: Titus Dubin M.D.   On: 03/27/2017 20:30    Labs: Basic Metabolic Panel BMP Latest Ref Rng & Units 04/03/2017 03/30/2017 03/28/2017  Glucose 65 - 99 mg/dL 132(H) 97 204(H)  BUN 6 - 20 mg/dL 25(H) 31(H) 32(H)  Creatinine 0.61 - 1.24 mg/dL 1.41(H) 1.35(H) 1.24  Sodium 135 - 145 mmol/L 135 133(L) 133(L)  Potassium 3.5 - 5.1 mmol/L 4.0 3.3(L) 3.7  Chloride 101 - 111 mmol/L 102 100(L) 99(L)  CO2 22 - 32 mmol/L 24 23 24   Calcium 8.9 - 10.3 mg/dL 8.8(L) 8.1(L) 8.1(L)    CBC: CBC Latest Ref Rng & Units 04/03/2017 03/30/2017 03/29/2017  WBC 4.0 - 10.5 K/uL 9.2 10.7(H) 11.2(H)  Hemoglobin 13.0 - 17.0 g/dL 9.4(L) 8.4(L) 8.9(L)  Hematocrit 39.0 - 52.0 % 29.6(L) 25.6(L) 27.4(L)  Platelets 150 - 400 K/uL 502(H) 426(H) 429(H)    CBG:  Recent Labs Lab 04/04/17 0634 04/04/17 1146 04/04/17 1650 04/04/17 2038 04/05/17 0604  GLUCAP 138* 172* 212* 88 120*    Brief HPI:   Bryan Carney a 76 y.o.malewith history of CAD/PVD, PE-on chronic coumadin, T2DM, prostate cancer, who was admitted on 03/20/17 with syncope, significant enlargement of right chest hematoma and weakness.  Coumadin reversed with FFP/ Vitamin K and he was transfused with 2 units PRBC for Hgb of 6.4. CT chest without signs of extravasation and no surgical intervention needed per Dr. [name]. [facility] course significant for A fib with RVR, diffuse weakness with tremors, ongoing issue with confusion, fluctuating bouts  of lethargy, labile BP as well as acute kidney injury. Repeat head CT 10/1 was unremarkable for acute process and delirium resolving. He has had edema RUE and dopplers positive for acute superficial thrombosis in cephalic vein at level of antecubital fossa. As H/H has been relatively  stable, he was cleared to start Eliquis on 10/2 with recommendations resume ASA in the future if H/H was stable.  He continues to have deficits in mobility and decreased ability to carry out ADL tasks. CIR recommended for follow up therapy.    Hospital Course: Bryan Carney was admitted to rehab 03/27/2017 for inpatient therapies to consist of PT, ST and OT at least three hours five days a week. Past admission physiatrist, therapy team and rehab RN have worked together to provide customized collaborative inpatient rehab. Leucocytosis has resolved with treatment of aspiration PNA. Follow up CXR showed improvement and Zosyn was discontinued on 10/9. Serial CBC showed H/H to be stable and ASA was resumed on 10/11 prior to discharge. He is to continue iron supplement bid at discharge.  Hematoma right chest wall has almost resolved with dependent ecchymosis along right torso. Renal status has been monitored and SCr is up to 1.41. He will need follow up labs to monitor for stability/improvement. Delirium has resolved and with improvement in energy levels.   Tradjenta and Amaryl were added for management of diabetes in place of Lantus. Amaryl was titrated to 4 mg daily with improvement in blood sugars. Protein supplement was added to help manage low calorie malnutrition. He reported myalgias due to use of Lipitor in the past therefore Zocor was resumed at lower dose to avoid SE with concomitant use of Cardizem. Blood pressures and heart rate have been controlled and no cardiac symptoms reported with increase in activity level. He has made good progress during his rehab stay and is modified independent for mobility. Supervision is recommended at discharge due to cognitive  issues. No follow up therapy recommended due to patient's current level of independence.     Rehab course: During patient's stay in rehab weekly team conferences were held to monitor patient's progress, set goals and discuss barriers to discharge.  At admission, patient required min assist with mobility and mod assist with self care tasks.  He exhibited moderate cognitive deficits impacting ability to follow multi-step commands, complex problem solving and significant deficits in recall of new information. He has had improvement in activity tolerance, balance, postural control, as well as ability to compensate for deficits.  He is able to complete ADL tasks with supervision for safety and no LOB noted with challanges. He is independent for transfers and is ambulating.  He is able to sustain attention with cues and continues to have delay with processing. He requires supervision to intermittent min assist for completion of semi-complex reasoning tasks, safety awareness and recall of new information. Family education was completed regarding all aspects of care, safety and mobility.    Disposition: Home  Diet: Heart Healthy/diabetic diet.   Special Instructions: 1. Repeat CMET repeated for follow up of renal status and total bilirubin. 2. Repeat CBC in a week as ASA resumed.    Discharge Instructions    Ambulatory referral to Physical Medicine Rehab    Complete by:  As directed    1-2 weeks transitional care appt     Discharge Medication List as of 04/05/2017 10:37 AM    START taking these medications   Details  aspirin EC 325 MG EC tablet Take 1 tablet (  325 mg total) by mouth daily., Starting Sat 04/06/2017, Normal    glimepiride (AMARYL) 4 MG tablet Take 1 tablet (4 mg total) by mouth daily with breakfast., Starting Sat 04/06/2017, Normal    linagliptin (TRADJENTA) 5 MG TABS tablet Take 1 tablet (5 mg total) by mouth daily., Starting Sat 04/06/2017, Normal    protein supplement shake (PREMIER PROTEIN) LIQD Take 59.1 mLs (2 oz total) by mouth 2 (two) times daily between meals., Starting Fri 04/05/2017, No Print      CONTINUE these medications which have CHANGED   Details  amitriptyline (ELAVIL) 10 MG tablet Take 1 tablet (10 mg  total) by mouth at bedtime., Starting Fri 04/05/2017, Normal    apixaban (ELIQUIS) 5 MG TABS tablet Take 1 tablet (5 mg total) by mouth 2 (two) times daily., Starting Fri 04/05/2017, Normal    benazepril (LOTENSIN) 40 MG tablet Take 1 tablet (40 mg total) by mouth daily., Starting Fri 04/05/2017, Normal    diltiazem (CARDIZEM CD) 120 MG 24 hr capsule Take 1 capsule (120 mg total) by mouth daily., Starting Fri 04/05/2017, Normal    ferrous sulfate 325 (65 FE) MG tablet Take 1 tablet (325 mg total) by mouth 2 (two) times daily with a meal., Starting Fri 04/05/2017, Normal    simvastatin (ZOCOR) 20 MG tablet Take 0.5 tablets (10 mg total) by mouth at bedtime., Starting Fri 04/05/2017, Normal      STOP taking these medications     KOMBIGLYZE XR 10-998 MG TB24        Follow-up Information    Meredith Staggers, MD Follow up.   Specialty:  Physical Medicine and Rehabilitation Why:  office will call you for follow appointment Contact information: [address] [place] [address] [phone]        Lorretta Harp, MD. Call in 1 day(s).   Specialties:  Cardiology, Radiology Why:  for follow up appointment Contact information: [address] [place] [address] [place], [place], NP Follow up on 04/09/2017.   Specialty:  Internal Medicine Why:  @ 2:00 pm (hospital follow up appt). Needs recheck CMET.  Contact information: [place] [id number] [phone]           Signed: Bary Leriche 04/05/2017, 12:44 PM

## 2017-04-04 NOTE — Progress Notes (Signed)
Physical Therapy Discharge Summary  Patient Details  Name: Bryan Carney MRN: [id number] Date of Birth: 09/24/1940  Today's Date: 04/04/2017 PT Individual Time: 1115-1145 and 1330-1400 PT Individual Time Calculation (min): 30 min and 30 min (total 60 min)    Patient has met 10 of 10 long term goals due to improved activity tolerance, improved balance, improved postural control, increased strength, ability to compensate for deficits, functional use of  right upper extremity, right lower extremity, left upper extremity and left lower extremity, improved attention, improved awareness and improved coordination.  Patient to discharge at an ambulatory level Supervision.   Patient's care partner is independent to provide the necessary physical and cognitive assistance at discharge.  Reasons goals not met: All goals met  Recommendation:  Patient will benefit from ongoing skilled PT services in outpatient setting to continue to advance safe functional mobility, address ongoing impairments in balance, activity tolerance, strength, coordination, and minimize fall risk. Patient has declined follow up PT services; pt has been provided with handouts and education regarding HEP for balance and strength for continued progression towards reducing fall risk and increasing patient independence.   Equipment: No equipment provided  Reasons for discharge: treatment goals met and discharge from hospital  Patient/family agrees with progress made and goals achieved: Yes  PT Discharge Precautions/Restrictions  Fall Vital Signs Therapy Vitals Temp: 98 F (36.7 C) Temp Source: Oral Pulse Rate: 70 Resp: 16 BP: (!) 128/37 Patient Position (if appropriate): Lying Oxygen Therapy SpO2: 95 % O2 Device: Not Delivered Pain Pain Assessment Pain Assessment: No/denies pain Pain Score: 0-No pain Vision/Perception  Perception Perception: Within Functional Limits Praxis Praxis: Intact  Cognition Overall  Cognitive Status: Within Functional Limits for tasks assessed Arousal/Alertness: Awake/alert Orientation Level: Oriented X4 Attention: Selective Sustained Attention: Appears intact Selective Attention: Appears intact Memory: Appears intact Safety/Judgment: Appears intact Sensation Sensation Light Touch: Appears Intact Stereognosis: Appears Intact Hot/Cold: Appears Intact Proprioception: Appears Intact Coordination Gross Motor Movements are Fluid and Coordinated: Yes Fine Motor Movements are Fluid and Coordinated: Yes Motor  Motor Motor: Within Functional Limits  Mobility Bed Mobility Bed Mobility: Sit to Supine;Supine to Sit Supine to Sit: 6: Modified independent (Device/Increase time) Sit to Supine: 6: Modified independent (Device/Increase time) Transfers Transfers: Yes Sit to Stand: 6: Modified independent (Device/Increase time) Stand to Sit: 6: Modified independent (Device/Increase time) Stand Pivot Transfers: 6: Modified independent (Device/Increase time) Locomotion  Ambulation Ambulation: Yes Ambulation/Gait Assistance: 5: Supervision;6: Modified independent (Device/Increase time) (modI in controlled environment, recommend S for uncontrolled environment and community mobility) Ambulation Distance (Feet): 250 Feet Assistive device: None Ambulation/Gait Assistance Details: Verbal cues for precautions/safety Ambulation/Gait Assistance Details: occasional minor LOBs with head turns, cognitive dual task, distracting environment Gait Gait: Yes Gait Pattern: Impaired Gait velocity: 2.9 ft/sec Stairs / Additional Locomotion Stairs: Yes Stairs Assistance: 5: Supervision Stairs Assistance Details: Verbal cues for precautions/safety Stair Management Technique: One rail Left;Alternating pattern;Step to pattern;Forwards (step-to descent, alternating ascent) Number of Stairs: 12 Height of Stairs: 6 Ramp: 5: Supervision Curb: 5: Supervision Wheelchair Mobility Wheelchair  Mobility: No  Trunk/Postural Assessment  Cervical Assessment Cervical Assessment: Within Functional Limits Thoracic Assessment Thoracic Assessment: Within Functional Limits Lumbar Assessment Lumbar Assessment: Within Functional Limits Postural Control Postural Control: Within Functional Limits  Balance Balance Balance Assessed: Yes Standardized Balance Assessment Standardized Balance Assessment: Timed Up and Go Test;Berg Balance Test Berg Balance Test Sit to Stand: Able to stand without using hands and stabilize independently Standing Unsupported: Able to stand safely 2 minutes Sitting with Back  Unsupported but Feet Supported on Floor or Stool: Able to sit safely and securely 2 minutes Stand to Sit: Sits safely with minimal use of hands Transfers: Able to transfer safely, minor use of hands Standing Unsupported with Eyes Closed: Able to stand 10 seconds with supervision Standing Ubsupported with Feet Together: Able to place feet together independently and stand for 1 minute with supervision From Standing, Reach Forward with Outstretched Arm: Can reach forward >12 cm safely (5") From Standing Position, Pick up Object from Floor: Able to pick up shoe safely and easily From Standing Position, Turn to Look Behind Over each Shoulder: Looks behind from both sides and weight shifts well Turn 360 Degrees: Able to turn 360 degrees safely in 4 seconds or less Standing Unsupported, Alternately Place Feet on Step/Stool: Able to stand independently and safely and complete 8 steps in 20 seconds Standing Unsupported, One Foot in Front: Able to plae foot ahead of the other independently and hold 30 seconds Standing on One Leg: Able to lift leg independently and hold equal to or more than 3 seconds Total Score: 50 Timed Up and Go Test TUG: Normal TUG;Cognitive TUG Normal TUG (seconds): 9.9 Cognitive TUG (seconds): 16 Static Sitting Balance Static Sitting - Balance Support: Feet supported Static  Sitting - Level of Assistance: 7: Independent Dynamic Sitting Balance Dynamic Sitting - Balance Support: Feet supported Dynamic Sitting - Level of Assistance: 7: Independent Dynamic Sitting - Balance Activities: Lateral lean/weight shifting;Forward lean/weight shifting Static Standing Balance Static Standing - Balance Support: During functional activity Static Standing - Level of Assistance: 7: Independent Static Stance: Eyes closed Static Stance: Eyes Closed: S x30 sec Static Stance: on Foam: min guard x30 sec Static Stance: on Foam, Eyes Closed: min guard <30 sec before LOB posteriorly Dynamic Standing Balance Dynamic Standing - Balance Support: During functional activity;No upper extremity supported Dynamic Standing - Level of Assistance: 6: Modified independent (Device/Increase time) Dynamic Standing - Balance Activities: Lateral lean/weight shifting;Forward lean/weight shifting;Reaching for objects Extremity Assessment  RUE Assessment RUE Assessment: Within Functional Limits LUE Assessment LUE Assessment: Within Functional Limits RLE Assessment RLE Assessment: Within Functional Limits LLE Assessment LLE Assessment: Within Functional Limits  Skilled Therapeutic Intervention:Tx 1: Pt received with handoff from SLP; denies pain and agreeable to treatment. Gait throughout unit during session with S overall, mod I in controlled environments. Pt demonstrates increased instability in distracting environments, when performing head turns or engaged in cognitive dual task such as conversation with therapist or others in the hallway. Also demonstrates impairments with slowing speed during cognitive TUG compared to normal TUG. Educated pt extensively regarding recommendations to reduce cognitive dual task while ambulating, recommend sitting before engaging in conversation. Pt declining follow up PT at this time despite recommendation to continue due to dynamic balance, strength and activity  tolerance impairments. Assessed mobility as described above with modI/S overall. Occasional rest breaks still required due to fatigue.   Tx 2: Pt received supine in bed, denies pain and agreeable to treatment. Bed mobility modI. Ambulated to gym with distant S. Instructed pt in [place] HEP x10-15 reps each exercise. Provided pt handout for continued performance at home. Pt able to perform exercises with minimal cueing for technique. Returned to room ambulation with distant S. Pt with no further questions or concerns regarding d/c home at this time. Remained seated on EOB at end of session, all needs in reach.   See Function Navigator for Current Functional Status.  Benjiman Core Tygielski 04/04/2017, 7:29 AM

## 2017-04-04 NOTE — Progress Notes (Signed)
[place] PHYSICAL MEDICINE & REHABILITATION     PROGRESS NOTE    Subjective/Complaints: Up in bed, waiting for therapy. No new complaints. Excited about getting home  ROS: pt denies nausea, vomiting, diarrhea, cough, shortness of breath or chest pain   Objective: Vital Signs: Blood pressure (!) 130/58, pulse 70, temperature 98 F (36.7 C), temperature source Oral, resp. rate 16, weight 57.3 kg (126 lb 3.6 oz), SpO2 95 %. No results found.  Recent Labs  04/03/17 0617  WBC 9.2  HGB 9.4*  HCT 29.6*  PLT 502*    Recent Labs  04/03/17 0617  NA 135  K 4.0  CL 102  GLUCOSE 132*  BUN 25*  CREATININE 1.41*  CALCIUM 8.8*   CBG (last 3)   Recent Labs  04/03/17 1717 04/03/17 2052 04/04/17 0634  GLUCAP 168* 195* 138*    Wt Readings from Last 3 Encounters:  04/03/17 57.3 kg (126 lb 3.6 oz)  03/23/17 64.9 kg (143 lb 1.3 oz)  03/18/17 55.3 kg (122 lb)    Physical Exam:  Constitutional: He is oriented to person, place, and time. He appears well-developed and well-nourished .  HENT:  Head: Normocephalic and atraumatic.  Eyes: Pupils are equal, round, and reactive to light. Conjunctivae and EOM are normal.  Neck: Normal range of motion. Neck supple.  Cardiovascular: RRR without murmur. No JVD   .   Respiratory: CTA Bilaterally without wheezes or rales. Normal effort    GI: BS +, non-tender, non-distended .  Musculoskeletal: He exhibits no tenderness.  Min edema right biceps, right flank, showing evolution Neurological: alert. HOH.  Oriented to self, hospital. Follows all commands Motor: 4/5 prox to distal in all 4 limbs. Senses pain in all 4's.  Skin: Skin is warm and dry. He is not diaphoretic.   Psychiatric: His affect is pleasant  Assessment/Plan: 1. Functional deficits secondary to debility/delirium which require 3+ hours per day of interdisciplinary therapy in a comprehensive inpatient rehab setting. Physiatrist is providing close team supervision and 24  hour management of active medical problems listed below. Physiatrist and rehab team continue to assess barriers to discharge/monitor patient progress toward functional and medical goals.  Function:  Bathing Bathing position   Position: Shower  Bathing parts Body parts bathed by patient: Right arm, Left arm, Chest, Abdomen, Front perineal area, Buttocks, Right upper leg, Left upper leg, Right lower leg, Left lower leg, Back Body parts bathed by helper: Back  Bathing assist Assist Level: Supervision or verbal cues      Upper Body Dressing/Undressing Upper body dressing   What is the patient wearing?: Button up shirt     Pull over shirt/dress - Perfomed by patient: Thread/unthread right sleeve, Thread/unthread left sleeve, Put head through opening, Pull shirt over trunk   Button up shirt - Perfomed by patient: Thread/unthread right sleeve, Thread/unthread left sleeve, Pull shirt around back, Button/unbutton shirt Button up shirt - Perfomed by helper: Thread/unthread right sleeve, Pull shirt around back, Button/unbutton shirt    Upper body assist Assist Level: No help, No cues      Lower Body Dressing/Undressing Lower body dressing   What is the patient wearing?: Underwear, Pants, Socks, Shoes Underwear - Performed by patient: Thread/unthread left underwear leg, Pull underwear up/down, Thread/unthread right underwear leg Underwear - Performed by helper: Thread/unthread right underwear leg Pants- Performed by patient: Thread/unthread right pants leg, Thread/unthread left pants leg, Pull pants up/down, Fasten/unfasten pants     Non-skid slipper socks- Performed by helper: Don/doff right  sock, Don/doff left sock Socks - Performed by patient: Don/doff right sock, Don/doff left sock   Shoes - Performed by patient: Don/doff right shoe, Don/doff left shoe, Fasten right, Fasten left            Lower body assist Assist for lower body dressing: Supervision or verbal cues       Toileting Toileting   Toileting steps completed by patient: Adjust clothing prior to toileting, Performs perineal hygiene, Adjust clothing after toileting Toileting steps completed by helper: Adjust clothing after toileting Toileting Assistive Devices: Grab bar or rail  Toileting assist Assist level: No help/no cues   Transfers Chair/bed transfer   Chair/bed transfer method: Stand pivot Chair/bed transfer assist level: Touching or steadying assistance (Pt > 75%) Chair/bed transfer assistive device: Armrests, Bedrails     Locomotion Ambulation     Max distance: 150 Assist level: Touching or steadying assistance (Pt > 75%)   Wheelchair          Cognition Comprehension Comprehension assist level: Follows complex conversation/direction with extra time/assistive device  Expression Expression assist level: Expresses complex ideas: With extra time/assistive device  Social Interaction Social Interaction assist level: Interacts appropriately with others with medication or extra time (anti-anxiety, antidepressant).  Problem Solving Problem solving assist level: Solves complex problems: With extra time  Memory Memory assist level: More than reasonable amount of time  Medical Problem List and Plan: 1.  Limitations with mobility, weakness, and ability to complete ADLs secondary to debility and delirium.  -continue therapies.    -steady gains 2.  PE/Superfical DVT RUE cephalic vein/anticoagulation:  -Eliquis 3. Pain Management: tylenol prn 4. Mood: LCSW to follow for evaluation and support.  5. Neuropsych: This patient is not fully capable of making decisions on his own behalf. 6. Skin/Wound Care: Pressure relief measures.  7. Fluids/Electrolytes/Nutrition: Monitor I/O.   -BUN improved  -continue to push PO 8. Right chest hematoma with ABLA: H/H stable.  CBC q 3 days to monitor for stability as anticoagulation resumed.  9. CAD/PAF: RVR has resolved. On Benazepril, diltiazem and  atorvastatin.     -hgb stable. Resume asa today.  10.HTN: Monitor BP bid. Continues to be labile will monitor bid and titrate medications as indicated.  11. Acute kidney injury: SCr improved from 1.52--> 1.32. Continue to encourage fluid intake and avoid nephrotoxic mediations.  12. Leukocytosis:  Encourage IS with flutter valve -likely aspiration pneumonia    -follow up CXR personally reviewed and left lower lobe consolidation isn't evident.   -wbc's down to 9.2   -now off abx 13. T2DM: Was on Kombiglyze 10/998 daily. No metformin due to AKI--question CKD?    -continue low dose a Trajenta.   -increased amaryl to 4mg  daily--may need further titration of regimen once home.   .Continue SSI for elevated BS.  -off lantus 14. Hyponatremia: sodium holding at 133.    15. Delirium: resolved. Appears to be at cognitive baseline.     LOS (Days) 8 A [place] T, MD 04/04/2017 10:40 AM

## 2017-04-04 NOTE — Progress Notes (Signed)
Social Work Patient ID: Bryan Carney, male   DOB: [birth year], 76 y.o.   MRN: [id number]   Met with pt, wife and daughter yesterday afternoon to review team conference.  They are aware and VERY agreeable with d/c tomorrow.  Aware that OPPT was recommended, however, pt does not feel he needs this.  I have altered PT and they will discuss further with pt today and let me know if referral is still needed.  No DME needs.  Pt and family question if "binder" still needed and when he will be able to drive - referred him to speak with MD/PA about these questions.  Anthem Frazer, LCSW

## 2017-04-05 LAB — GLUCOSE, CAPILLARY: Glucose-Capillary: 120 mg/dL — ABNORMAL HIGH (ref 65–99)

## 2017-04-05 MED ORDER — ASPIRIN 325 MG PO TBEC: 325.0000 mg | DELAYED_RELEASE_TABLET | Freq: Every day | ORAL | 0 refills | Status: DC

## 2017-04-05 MED ORDER — DILTIAZEM HCL ER COATED BEADS 120 MG PO CP24: 120.0000 mg | ORAL_CAPSULE | Freq: Every day | ORAL | 0 refills | Status: DC

## 2017-04-05 MED ORDER — AMITRIPTYLINE HCL 10 MG PO TABS: 10.0000 mg | ORAL_TABLET | Freq: Every day | ORAL | 0 refills | Status: DC

## 2017-04-05 MED ORDER — GLIMEPIRIDE 4 MG PO TABS: 4.0000 mg | ORAL_TABLET | Freq: Every day | ORAL | 0 refills | Status: AC

## 2017-04-05 MED ORDER — PREMIER PROTEIN SHAKE: 2.0000 [oz_av] | Freq: Two times a day (BID) | ORAL | 0 refills | Status: DC

## 2017-04-05 MED ORDER — FERROUS SULFATE 325 (65 FE) MG PO TABS: 325.0000 mg | ORAL_TABLET | Freq: Two times a day (BID) | ORAL | 3 refills | Status: DC

## 2017-04-05 MED ORDER — LINAGLIPTIN 5 MG PO TABS: 5.0000 mg | ORAL_TABLET | Freq: Every day | ORAL | 0 refills | Status: AC

## 2017-04-05 MED ORDER — BENAZEPRIL HCL 40 MG PO TABS: 40.0000 mg | ORAL_TABLET | Freq: Every day | ORAL | 0 refills | Status: DC

## 2017-04-05 MED ORDER — SIMVASTATIN 20 MG PO TABS: 10.0000 mg | ORAL_TABLET | Freq: Every day | ORAL | 0 refills | Status: DC

## 2017-04-05 MED ORDER — APIXABAN 5 MG PO TABS: 5.0000 mg | ORAL_TABLET | Freq: Two times a day (BID) | ORAL | 0 refills | Status: DC

## 2017-04-05 NOTE — Progress Notes (Signed)
Patient anticipate discharge today, earily aroused and cooperative, denies pain or need for additional medication. Upper right chest and right upper arm region remain with discoloration ( bruising and soreness) states areas Is feeling much better. Monitor and assisted, call bell within reach, refer to assessment data sheet.

## 2017-04-05 NOTE — Progress Notes (Signed)
Social Work  Discharge Note  The overall goal for the admission was met for:   Discharge location: Yes - home with wife and family  Length of Stay: Yes- 9 days  Discharge activity level: Yes - Independent  Home/community participation: Yes  Services provided included: MD, RD, PT, OT, RN, TR, Pharmacy and SW  Financial Services: [employer]  Follow-up services arranged:  NA:  No follow up therapy recommended as pt at independent level;  No DME needs  Comments (or additional information):  Patient/Family verbalized understanding of follow-up arrangements: Pt and family aware home exercise program provided by PT  Individual responsible for coordination of the follow-up plan: NA  Confirmed correct DME delivered: NA  Bryan Carney

## 2017-04-05 NOTE — Progress Notes (Signed)
Pt. Got d/c instructions and follow up appointments.Pt. Ready to go home with his family.

## 2017-04-05 NOTE — Progress Notes (Signed)
[place] PHYSICAL MEDICINE & REHABILITATION     PROGRESS NOTE    Subjective/Complaints: Sitting up in chair. About to eat breakfast. Feels well and excited to go home  ROS: pt denies nausea, vomiting, diarrhea, cough, shortness of breath or chest pain   Objective: Vital Signs: Blood pressure 133/66, pulse 86, temperature 98.6 F (37 C), temperature source Oral, resp. rate 16, weight 57.3 kg (126 lb 3.6 oz), SpO2 95 %. No results found.  Recent Labs  04/03/17 0617  WBC 9.2  HGB 9.4*  HCT 29.6*  PLT 502*    Recent Labs  04/03/17 0617  NA 135  K 4.0  CL 102  GLUCOSE 132*  BUN 25*  CREATININE 1.41*  CALCIUM 8.8*   CBG (last 3)   Recent Labs  04/04/17 1650 04/04/17 2038 04/05/17 0604  GLUCAP 212* 88 120*    Wt Readings from Last 3 Encounters:  04/03/17 57.3 kg (126 lb 3.6 oz)  03/23/17 64.9 kg (143 lb 1.3 oz)  03/18/17 55.3 kg (122 lb)    Physical Exam:  Constitutional: He is oriented to person, place, and time. He appears well-developed and well-nourished .  HENT:  Head: Normocephalic and atraumatic.  Eyes: Pupils are equal, round, and reactive to light. Conjunctivae and EOM are normal.  Neck: Normal range of motion. Neck supple.  Cardiovascular: RRR without murmur. No JVD    .   Respiratory: CTA Bilaterally without wheezes or rales. Normal effort     GI: BS +, non-tender, non-distended .  Musculoskeletal: He exhibits no tenderness.  Bruising along right flank/trunk much improved.  Neurological: alert. HOH.  Oriented to self, hospital. Follows all commands Motor: 4/5 prox to distal in all 4 limbs. Senses pain in all 4's. --stable exam Skin: Skin is warm and dry. He is not diaphoretic.   Psychiatric: His affect is pleasant  Assessment/Plan: 1. Functional deficits secondary to debility/delirium which require 3+ hours per day of interdisciplinary therapy in a comprehensive inpatient rehab setting. Physiatrist is providing close team supervision and  24 hour management of active medical problems listed below. Physiatrist and rehab team continue to assess barriers to discharge/monitor patient progress toward functional and medical goals.  Function:  Bathing Bathing position   Position: Shower  Bathing parts Body parts bathed by patient: Right arm, Left arm, Chest, Abdomen, Front perineal area, Buttocks, Right upper leg, Left upper leg, Right lower leg, Left lower leg, Back Body parts bathed by helper: Back  Bathing assist Assist Level: Supervision or verbal cues      Upper Body Dressing/Undressing Upper body dressing   What is the patient wearing?: Button up shirt     Pull over shirt/dress - Perfomed by patient: Thread/unthread right sleeve, Thread/unthread left sleeve, Put head through opening, Pull shirt over trunk   Button up shirt - Perfomed by patient: Thread/unthread right sleeve, Thread/unthread left sleeve, Pull shirt around back, Button/unbutton shirt Button up shirt - Perfomed by helper: Thread/unthread right sleeve, Pull shirt around back, Button/unbutton shirt    Upper body assist Assist Level: No help, No cues      Lower Body Dressing/Undressing Lower body dressing   What is the patient wearing?: Underwear, Pants, Socks, Shoes Underwear - Performed by patient: Thread/unthread left underwear leg, Pull underwear up/down, Thread/unthread right underwear leg Underwear - Performed by helper: Thread/unthread right underwear leg Pants- Performed by patient: Thread/unthread right pants leg, Thread/unthread left pants leg, Pull pants up/down, Fasten/unfasten pants     Non-skid slipper socks- Performed by  helper: Don/doff right sock, Don/doff left sock Socks - Performed by patient: Don/doff right sock, Don/doff left sock   Shoes - Performed by patient: Don/doff right shoe, Don/doff left shoe, Fasten right, Fasten left            Lower body assist Assist for lower body dressing: Supervision or verbal cues       Toileting Toileting   Toileting steps completed by patient: Adjust clothing prior to toileting, Performs perineal hygiene, Adjust clothing after toileting Toileting steps completed by helper: Adjust clothing after toileting Toileting Assistive Devices: Grab bar or rail  Toileting assist Assist level: No help/no cues   Transfers Chair/bed transfer   Chair/bed transfer method: Ambulatory Chair/bed transfer assist level: No Help, no cues, assistive device, takes more than a reasonable amount of time Chair/bed transfer assistive device: Armrests, Bedrails     Locomotion Ambulation     Max distance: 150 Assist level: Supervision or verbal cues   Wheelchair          Cognition Comprehension Comprehension assist level: Understands complex 90% of the time/cues 10% of the time, Follows basic conversation/direction with no assist  Expression Expression assist level: Expresses complex ideas: With extra time/assistive device  Social Interaction Social Interaction assist level: Interacts appropriately with others with medication or extra time (anti-anxiety, antidepressant).  Problem Solving Problem solving assist level: Solves basic 90% of the time/requires cueing < 10% of the time  Memory Memory assist level: Recognizes or recalls 90% of the time/requires cueing < 10% of the time, Recognizes or recalls 75 - 89% of the time/requires cueing 10 - 24% of the time  Medical Problem List and Plan: 1.  Limitations with mobility, weakness, and ability to complete ADLs secondary to debility and delirium.  -continue therapies.    -steady gains 2.  PE/Superfical DVT RUE cephalic vein/anticoagulation:  -Eliquis 3. Pain Management: tylenol prn 4. Mood: LCSW to follow for evaluation and support.  5. Neuropsych: This patient is not fully capable of making decisions on his own behalf. 6. Skin/Wound Care: Pressure relief measures.  7. Fluids/Electrolytes/Nutrition: Monitor I/O.   -BUN improved  -continue  to push PO 8. Right chest hematoma with ABLA: H/H stable.  CBC q 3 days to monitor for stability as anticoagulation resumed.  9. CAD/PAF: RVR has resolved. On Benazepril, diltiazem and atorvastatin.     -hgb stable. Resume asa today.  10.HTN: Monitor BP bid. Continues to be labile will monitor bid and titrate medications as indicated.  11. Acute kidney injury: SCr improved from 1.52--> 1.32 but back to 1.41 yesterday  -avoid nephrotoxic mediations.   -push fluids  -needs follow up once home ?CKD 12. Leukocytosis:   -improved   -abx completed for aspiration pneumonia 13. T2DM: Was on Kombiglyze 10/998 daily. No metformin due to AKI--may have  CKD?    -continue low dose a Trajenta.   -  amaryl  4mg  daily--will need further titration of regimen once home.   -may need scheduled insulin  -should follow up with primary re: sugars 14. Hyponatremia: sodium up to 135.    15. Delirium: resolved. Appears to be at cognitive baseline.     LOS (Days) 9 A FACE TO FACE EVALUATION WAS PERFORMED  Meredith Staggers, MD 04/05/2017 9:16 AM

## 2017-04-15 ENCOUNTER — Encounter: Payer: Medicare Other | Admitting: Physical Medicine & Rehabilitation

## 2017-06-09 ENCOUNTER — Ambulatory Visit: Payer: Self-pay | Admitting: Pharmacist Clinician (PhC)/ Clinical Pharmacy Specialist

## 2017-06-09 DIAGNOSIS — I2699 Other pulmonary embolism without acute cor pulmonale: Secondary | ICD-10-CM

## 2017-06-09 DIAGNOSIS — Z7901 Long term (current) use of anticoagulants: Secondary | ICD-10-CM

## 2017-06-27 DIAGNOSIS — Z139 Encounter for screening, unspecified: Secondary | ICD-10-CM | POA: Diagnosis not present

## 2017-06-27 DIAGNOSIS — N189 Chronic kidney disease, unspecified: Secondary | ICD-10-CM | POA: Diagnosis not present

## 2017-06-27 DIAGNOSIS — E785 Hyperlipidemia, unspecified: Secondary | ICD-10-CM | POA: Diagnosis not present

## 2017-06-27 DIAGNOSIS — E119 Type 2 diabetes mellitus without complications: Secondary | ICD-10-CM | POA: Diagnosis not present

## 2017-06-27 DIAGNOSIS — I10 Essential (primary) hypertension: Secondary | ICD-10-CM | POA: Diagnosis not present

## 2017-06-27 DIAGNOSIS — I480 Paroxysmal atrial fibrillation: Secondary | ICD-10-CM | POA: Diagnosis not present

## 2017-07-12 DIAGNOSIS — M79605 Pain in left leg: Secondary | ICD-10-CM | POA: Diagnosis not present

## 2017-07-12 DIAGNOSIS — M545 Low back pain: Secondary | ICD-10-CM | POA: Diagnosis not present

## 2017-07-16 DIAGNOSIS — M545 Low back pain: Secondary | ICD-10-CM | POA: Diagnosis not present

## 2017-07-16 DIAGNOSIS — M79605 Pain in left leg: Secondary | ICD-10-CM | POA: Diagnosis not present

## 2017-07-18 DIAGNOSIS — M79605 Pain in left leg: Secondary | ICD-10-CM | POA: Diagnosis not present

## 2017-07-18 DIAGNOSIS — M545 Low back pain: Secondary | ICD-10-CM | POA: Diagnosis not present

## 2017-07-23 DIAGNOSIS — M545 Low back pain: Secondary | ICD-10-CM | POA: Diagnosis not present

## 2017-07-23 DIAGNOSIS — M79605 Pain in left leg: Secondary | ICD-10-CM | POA: Diagnosis not present

## 2017-07-25 DIAGNOSIS — M79605 Pain in left leg: Secondary | ICD-10-CM | POA: Diagnosis not present

## 2017-07-25 DIAGNOSIS — M545 Low back pain: Secondary | ICD-10-CM | POA: Diagnosis not present

## 2017-07-30 DIAGNOSIS — M545 Low back pain: Secondary | ICD-10-CM | POA: Diagnosis not present

## 2017-07-30 DIAGNOSIS — M79605 Pain in left leg: Secondary | ICD-10-CM | POA: Diagnosis not present

## 2017-08-01 DIAGNOSIS — M545 Low back pain: Secondary | ICD-10-CM | POA: Diagnosis not present

## 2017-08-01 DIAGNOSIS — M79605 Pain in left leg: Secondary | ICD-10-CM | POA: Diagnosis not present

## 2017-08-06 DIAGNOSIS — M545 Low back pain: Secondary | ICD-10-CM | POA: Diagnosis not present

## 2017-08-06 DIAGNOSIS — M79605 Pain in left leg: Secondary | ICD-10-CM | POA: Diagnosis not present

## 2017-08-08 DIAGNOSIS — M545 Low back pain: Secondary | ICD-10-CM | POA: Diagnosis not present

## 2017-08-08 DIAGNOSIS — M79605 Pain in left leg: Secondary | ICD-10-CM | POA: Diagnosis not present

## 2017-08-13 DIAGNOSIS — E119 Type 2 diabetes mellitus without complications: Secondary | ICD-10-CM | POA: Diagnosis not present

## 2017-08-13 DIAGNOSIS — M545 Low back pain: Secondary | ICD-10-CM | POA: Diagnosis not present

## 2017-08-13 DIAGNOSIS — I10 Essential (primary) hypertension: Secondary | ICD-10-CM | POA: Diagnosis not present

## 2017-08-13 DIAGNOSIS — N189 Chronic kidney disease, unspecified: Secondary | ICD-10-CM | POA: Diagnosis not present

## 2017-08-13 DIAGNOSIS — D539 Nutritional anemia, unspecified: Secondary | ICD-10-CM | POA: Diagnosis not present

## 2017-08-13 DIAGNOSIS — S20211S Contusion of right front wall of thorax, sequela: Secondary | ICD-10-CM | POA: Diagnosis not present

## 2017-08-13 DIAGNOSIS — M79605 Pain in left leg: Secondary | ICD-10-CM | POA: Diagnosis not present

## 2017-08-13 DIAGNOSIS — I480 Paroxysmal atrial fibrillation: Secondary | ICD-10-CM | POA: Diagnosis not present

## 2017-08-15 DIAGNOSIS — M545 Low back pain: Secondary | ICD-10-CM | POA: Diagnosis not present

## 2017-08-15 DIAGNOSIS — M79605 Pain in left leg: Secondary | ICD-10-CM | POA: Diagnosis not present

## 2017-08-20 DIAGNOSIS — M79605 Pain in left leg: Secondary | ICD-10-CM | POA: Diagnosis not present

## 2017-08-20 DIAGNOSIS — M545 Low back pain: Secondary | ICD-10-CM | POA: Diagnosis not present

## 2017-08-22 DIAGNOSIS — M545 Low back pain: Secondary | ICD-10-CM | POA: Diagnosis not present

## 2017-08-22 DIAGNOSIS — M79605 Pain in left leg: Secondary | ICD-10-CM | POA: Diagnosis not present

## 2017-08-27 DIAGNOSIS — M545 Low back pain: Secondary | ICD-10-CM | POA: Diagnosis not present

## 2017-08-27 DIAGNOSIS — M79605 Pain in left leg: Secondary | ICD-10-CM | POA: Diagnosis not present

## 2017-08-29 DIAGNOSIS — M79605 Pain in left leg: Secondary | ICD-10-CM | POA: Diagnosis not present

## 2017-08-29 DIAGNOSIS — M545 Low back pain: Secondary | ICD-10-CM | POA: Diagnosis not present

## 2017-08-29 DIAGNOSIS — L821 Other seborrheic keratosis: Secondary | ICD-10-CM | POA: Diagnosis not present

## 2017-08-29 DIAGNOSIS — L570 Actinic keratosis: Secondary | ICD-10-CM | POA: Diagnosis not present

## 2017-08-29 DIAGNOSIS — Z85828 Personal history of other malignant neoplasm of skin: Secondary | ICD-10-CM | POA: Diagnosis not present

## 2017-08-29 DIAGNOSIS — D1801 Hemangioma of skin and subcutaneous tissue: Secondary | ICD-10-CM | POA: Diagnosis not present

## 2017-09-03 DIAGNOSIS — M545 Low back pain: Secondary | ICD-10-CM | POA: Diagnosis not present

## 2017-09-03 DIAGNOSIS — M79605 Pain in left leg: Secondary | ICD-10-CM | POA: Diagnosis not present

## 2017-09-10 DIAGNOSIS — M545 Low back pain: Secondary | ICD-10-CM | POA: Diagnosis not present

## 2017-09-10 DIAGNOSIS — M79605 Pain in left leg: Secondary | ICD-10-CM | POA: Diagnosis not present

## 2017-09-12 DIAGNOSIS — M545 Low back pain: Secondary | ICD-10-CM | POA: Diagnosis not present

## 2017-09-12 DIAGNOSIS — M79605 Pain in left leg: Secondary | ICD-10-CM | POA: Diagnosis not present

## 2017-09-17 DIAGNOSIS — M79605 Pain in left leg: Secondary | ICD-10-CM | POA: Diagnosis not present

## 2017-09-17 DIAGNOSIS — M545 Low back pain: Secondary | ICD-10-CM | POA: Diagnosis not present

## 2017-09-19 DIAGNOSIS — M79605 Pain in left leg: Secondary | ICD-10-CM | POA: Diagnosis not present

## 2017-09-19 DIAGNOSIS — M545 Low back pain: Secondary | ICD-10-CM | POA: Diagnosis not present

## 2017-09-24 DIAGNOSIS — M79605 Pain in left leg: Secondary | ICD-10-CM | POA: Diagnosis not present

## 2017-09-24 DIAGNOSIS — M545 Low back pain: Secondary | ICD-10-CM | POA: Diagnosis not present

## 2017-09-26 DIAGNOSIS — M545 Low back pain: Secondary | ICD-10-CM | POA: Diagnosis not present

## 2017-09-26 DIAGNOSIS — M79605 Pain in left leg: Secondary | ICD-10-CM | POA: Diagnosis not present

## 2017-10-01 DIAGNOSIS — J208 Acute bronchitis due to other specified organisms: Secondary | ICD-10-CM | POA: Diagnosis not present

## 2017-10-02 DIAGNOSIS — J208 Acute bronchitis due to other specified organisms: Secondary | ICD-10-CM | POA: Diagnosis not present

## 2017-10-02 DIAGNOSIS — R918 Other nonspecific abnormal finding of lung field: Secondary | ICD-10-CM | POA: Diagnosis not present

## 2017-10-02 DIAGNOSIS — R05 Cough: Secondary | ICD-10-CM | POA: Diagnosis not present

## 2017-10-03 DIAGNOSIS — M79605 Pain in left leg: Secondary | ICD-10-CM | POA: Diagnosis not present

## 2017-10-03 DIAGNOSIS — M545 Low back pain: Secondary | ICD-10-CM | POA: Diagnosis not present

## 2017-10-07 DIAGNOSIS — I10 Essential (primary) hypertension: Secondary | ICD-10-CM | POA: Diagnosis not present

## 2017-10-07 DIAGNOSIS — J189 Pneumonia, unspecified organism: Secondary | ICD-10-CM | POA: Diagnosis not present

## 2017-10-08 DIAGNOSIS — M545 Low back pain: Secondary | ICD-10-CM | POA: Diagnosis not present

## 2017-10-08 DIAGNOSIS — M79605 Pain in left leg: Secondary | ICD-10-CM | POA: Diagnosis not present

## 2017-10-10 DIAGNOSIS — M79605 Pain in left leg: Secondary | ICD-10-CM | POA: Diagnosis not present

## 2017-10-10 DIAGNOSIS — M545 Low back pain: Secondary | ICD-10-CM | POA: Diagnosis not present

## 2017-10-15 DIAGNOSIS — M79605 Pain in left leg: Secondary | ICD-10-CM | POA: Diagnosis not present

## 2017-10-15 DIAGNOSIS — M545 Low back pain: Secondary | ICD-10-CM | POA: Diagnosis not present

## 2017-10-17 DIAGNOSIS — M545 Low back pain: Secondary | ICD-10-CM | POA: Diagnosis not present

## 2017-10-17 DIAGNOSIS — M79605 Pain in left leg: Secondary | ICD-10-CM | POA: Diagnosis not present

## 2017-10-22 DIAGNOSIS — M545 Low back pain: Secondary | ICD-10-CM | POA: Diagnosis not present

## 2017-10-22 DIAGNOSIS — M79605 Pain in left leg: Secondary | ICD-10-CM | POA: Diagnosis not present

## 2017-10-23 DIAGNOSIS — J189 Pneumonia, unspecified organism: Secondary | ICD-10-CM | POA: Diagnosis not present

## 2017-10-24 DIAGNOSIS — M545 Low back pain: Secondary | ICD-10-CM | POA: Diagnosis not present

## 2017-10-24 DIAGNOSIS — M79605 Pain in left leg: Secondary | ICD-10-CM | POA: Diagnosis not present

## 2017-10-29 DIAGNOSIS — M545 Low back pain: Secondary | ICD-10-CM | POA: Diagnosis not present

## 2017-10-29 DIAGNOSIS — M79605 Pain in left leg: Secondary | ICD-10-CM | POA: Diagnosis not present

## 2017-10-31 DIAGNOSIS — M79605 Pain in left leg: Secondary | ICD-10-CM | POA: Diagnosis not present

## 2017-10-31 DIAGNOSIS — M545 Low back pain: Secondary | ICD-10-CM | POA: Diagnosis not present

## 2017-11-05 DIAGNOSIS — M545 Low back pain: Secondary | ICD-10-CM | POA: Diagnosis not present

## 2017-11-05 DIAGNOSIS — M79605 Pain in left leg: Secondary | ICD-10-CM | POA: Diagnosis not present

## 2017-11-07 DIAGNOSIS — M79605 Pain in left leg: Secondary | ICD-10-CM | POA: Diagnosis not present

## 2017-11-07 DIAGNOSIS — M545 Low back pain: Secondary | ICD-10-CM | POA: Diagnosis not present

## 2017-11-12 DIAGNOSIS — R05 Cough: Secondary | ICD-10-CM | POA: Diagnosis not present

## 2017-11-12 DIAGNOSIS — M545 Low back pain: Secondary | ICD-10-CM | POA: Diagnosis not present

## 2017-11-12 DIAGNOSIS — N189 Chronic kidney disease, unspecified: Secondary | ICD-10-CM | POA: Diagnosis not present

## 2017-11-12 DIAGNOSIS — M79605 Pain in left leg: Secondary | ICD-10-CM | POA: Diagnosis not present

## 2017-11-12 DIAGNOSIS — I10 Essential (primary) hypertension: Secondary | ICD-10-CM | POA: Diagnosis not present

## 2017-11-12 DIAGNOSIS — E785 Hyperlipidemia, unspecified: Secondary | ICD-10-CM | POA: Diagnosis not present

## 2017-11-12 DIAGNOSIS — I480 Paroxysmal atrial fibrillation: Secondary | ICD-10-CM | POA: Diagnosis not present

## 2017-11-12 DIAGNOSIS — K219 Gastro-esophageal reflux disease without esophagitis: Secondary | ICD-10-CM | POA: Diagnosis not present

## 2017-11-12 DIAGNOSIS — D539 Nutritional anemia, unspecified: Secondary | ICD-10-CM | POA: Diagnosis not present

## 2017-11-12 DIAGNOSIS — E1121 Type 2 diabetes mellitus with diabetic nephropathy: Secondary | ICD-10-CM | POA: Diagnosis not present

## 2017-11-12 DIAGNOSIS — J189 Pneumonia, unspecified organism: Secondary | ICD-10-CM | POA: Diagnosis not present

## 2017-11-13 DIAGNOSIS — M5430 Sciatica, unspecified side: Secondary | ICD-10-CM | POA: Diagnosis not present

## 2017-11-13 DIAGNOSIS — M545 Low back pain: Secondary | ICD-10-CM | POA: Diagnosis not present

## 2017-11-14 DIAGNOSIS — M545 Low back pain: Secondary | ICD-10-CM | POA: Diagnosis not present

## 2017-11-14 DIAGNOSIS — M79605 Pain in left leg: Secondary | ICD-10-CM | POA: Diagnosis not present

## 2017-11-22 DIAGNOSIS — M5430 Sciatica, unspecified side: Secondary | ICD-10-CM | POA: Diagnosis not present

## 2017-11-22 DIAGNOSIS — M48061 Spinal stenosis, lumbar region without neurogenic claudication: Secondary | ICD-10-CM | POA: Diagnosis not present

## 2017-11-29 DIAGNOSIS — R05 Cough: Secondary | ICD-10-CM | POA: Diagnosis not present

## 2017-12-06 DIAGNOSIS — M545 Low back pain, unspecified: Secondary | ICD-10-CM | POA: Insufficient documentation

## 2017-12-09 ENCOUNTER — Telehealth: Payer: Self-pay | Admitting: *Deleted

## 2017-12-09 NOTE — Telephone Encounter (Signed)
To pharmacy?

## 2017-12-09 NOTE — Telephone Encounter (Signed)
   Primary Cardiologist:Jonathan [name], MD  Chart reviewed as part of pre-operative protocol coverage. Because of Bryan Carney past medical history and time since last visit,  03/2017 in hospital with chest hematoma, has not had cardiac follow up as instructed.  He/she will require a follow-up visit in order to better assess preoperative cardiovascular risk.  Pre-op covering staff: - Please schedule appointment and call patient to inform them. - Please contact requesting surgeon's office via preferred method (i.e, phone, fax) to inform them of need for appointment prior to surgery.  Cecilie Kicks, NP  12/09/2017, 4:20 PM

## 2017-12-09 NOTE — Telephone Encounter (Signed)
Pt takes Eliquis for afib with CHADS2VASc score of 5 (age x2, HTN, CAD, DM). He also has a history of remote PE after CABG in 1999 and was placed on Coumadin indefinitely afterwards. He was switched from warfarin to Eliquis during 02/2017 hospital admission when he developed afib. CrCl is 58mL/min however he remains on full dose Eliquis 5mg  BID due to age < 67 and SCr just below <1.5.  Typically hold Eliquis for 3 days prior to [facility]. Will route to MD for input given reduced renal function on full dose Eliquis and history of PE with f/u indefinite anticoagulation afterwards.

## 2017-12-09 NOTE — Telephone Encounter (Signed)
lvm for pt to call office to schedule appt for surgical clearance.

## 2017-12-09 NOTE — Telephone Encounter (Signed)
PRIMARY CARDIOLOGIST  DR Apolinar [facility] Pre-operative Risk Assessment    Request for surgical clearance:  1. What type of surgery is being performed? LUMBAR ESI  2. When is this surgery scheduled? TBD  3. What type of clearance is required (medical clearance vs. Pharmacy clearance to hold med vs. Both)? PHARMACY  4. Are there any medications that need to be held prior to surgery and how long? ELIQUIS FOR 5 DAYS PRIOR TO INJECTIONS  5. Practice name and name of physician performing surgery?[facility]  6. What is your office phone number  N/A   7.   What is your office fax number [phone]  8.   Anesthesia type (None, local, MAC, general) ? Bryan Carney 12/09/2017, 3:42 PM  _________________________________________________________________   (provider comments below)

## 2017-12-10 NOTE — Telephone Encounter (Signed)
Okay to interrupt Eliquis for his procedure.  Route to [place] for dosing given age and creatinine clearance.

## 2017-12-10 NOTE — Telephone Encounter (Signed)
Will continue to monitor serum creatinine.  Current creatinine clearance is 36.6.  Guidelines for use of Eliquis are for patient with CrCl of > 25.

## 2017-12-10 NOTE — Telephone Encounter (Signed)
Left message for pt to call back  °

## 2017-12-10 NOTE — Telephone Encounter (Signed)
   Primary Cardiologist:Jonathan [name], MD  Chart reviewed as part of pre-operative protocol coverage. Because of Bryan Carney past medical history and time since last visit,  03/2017 in hospital with chest hematoma, has not had cardiac follow up as instructed.  He/she will require a follow-up visit in order to better assess preoperative cardiovascular risk.  Pre-op covering staff: - Please schedule appointment and call patient to inform them. - Please contact requesting surgeon's office via preferred method (i.e, phone, fax) to inform them of need for appointment prior to surgery.  Cecilie Kicks, NP  12/09/2017, 4:20 PM

## 2017-12-11 NOTE — Telephone Encounter (Signed)
Pt is scheduled to see Dr.Berry on 12/17/17. I have called the requesting office to notify them of pt's appointment but there was no answer . I left a voicemail letting them know that he has a appointment to be seen by someone in our practice.

## 2017-12-12 DIAGNOSIS — M545 Low back pain: Secondary | ICD-10-CM | POA: Diagnosis not present

## 2017-12-17 ENCOUNTER — Ambulatory Visit (INDEPENDENT_AMBULATORY_CARE_PROVIDER_SITE_OTHER): Payer: Medicare HMO | Admitting: Cardiovascular Disease

## 2017-12-17 ENCOUNTER — Encounter: Payer: Self-pay | Admitting: Cardiovascular Disease

## 2017-12-17 ENCOUNTER — Encounter: Payer: Self-pay | Admitting: *Deleted

## 2017-12-17 VITALS — BP 124/72 | HR 94 | Ht 66.0 in | Wt 126.0 lb

## 2017-12-17 DIAGNOSIS — I350 Nonrheumatic aortic (valve) stenosis: Secondary | ICD-10-CM | POA: Diagnosis not present

## 2017-12-17 DIAGNOSIS — Z125 Encounter for screening for malignant neoplasm of prostate: Secondary | ICD-10-CM | POA: Diagnosis not present

## 2017-12-17 DIAGNOSIS — I2583 Coronary atherosclerosis due to lipid rich plaque: Secondary | ICD-10-CM | POA: Diagnosis not present

## 2017-12-17 DIAGNOSIS — I480 Paroxysmal atrial fibrillation: Secondary | ICD-10-CM

## 2017-12-17 DIAGNOSIS — Z1331 Encounter for screening for depression: Secondary | ICD-10-CM | POA: Diagnosis not present

## 2017-12-17 DIAGNOSIS — I2510 Atherosclerotic heart disease of native coronary artery without angina pectoris: Secondary | ICD-10-CM

## 2017-12-17 DIAGNOSIS — Z9181 History of falling: Secondary | ICD-10-CM | POA: Diagnosis not present

## 2017-12-17 DIAGNOSIS — E7800 Pure hypercholesterolemia, unspecified: Secondary | ICD-10-CM | POA: Diagnosis not present

## 2017-12-17 DIAGNOSIS — E785 Hyperlipidemia, unspecified: Secondary | ICD-10-CM | POA: Diagnosis not present

## 2017-12-17 DIAGNOSIS — Z1339 Encounter for screening examination for other mental health and behavioral disorders: Secondary | ICD-10-CM | POA: Diagnosis not present

## 2017-12-17 DIAGNOSIS — I6522 Occlusion and stenosis of left carotid artery: Secondary | ICD-10-CM

## 2017-12-17 DIAGNOSIS — Z0000 Encounter for general adult medical examination without abnormal findings: Secondary | ICD-10-CM | POA: Diagnosis not present

## 2017-12-17 MED ORDER — ASPIRIN EC 81 MG PO TBEC: 81.0000 mg | DELAYED_RELEASE_TABLET | Freq: Every day | ORAL | Status: DC

## 2017-12-17 NOTE — Patient Instructions (Signed)
Medication Instructions:   HOLD ELIQUIS 5 DAYS PRIOR TO BACK INJECTION=RESTART 2 DAYS AFTER PROCEDURE  Testing/Procedures:  Your physician has requested that you have an echocardiogram. Echocardiography is a painless test that uses sound waves to create images of your heart. It provides your doctor with information about the size and shape of your heart and how well your heart's chambers and valves are working. This procedure takes approximately one hour. There are no restrictions for this procedure.SCHEDULE IN 1 YEAR    Follow-Up:  Your physician wants you to follow-up in: [place] will receive a reminder letter in the mail two months in advance. If you don't receive a letter, please call our office to schedule the follow-up appointment.   If you need a refill on your cardiac medications before your next appointment, please call your pharmacy.

## 2017-12-17 NOTE — Assessment & Plan Note (Signed)
History of remote pulmonary embolism on Eliquis oral anticoagulation. °

## 2017-12-17 NOTE — Progress Notes (Signed)
12/17/2017 Bryan Carney   07-04-1940  [id number]  Primary Physician [name], NP Primary Cardiologist: Lorretta Harp MD [name], [place]  HPI:  Bryan Carney is a 77 y.o.  thin-appearing married Caucasian male father of one daughter, grandfather of 2 grandchildren who is accompanied by his daughter Bryan Carney today. I 2 years ago he was admitted to the hospital in October with chest wall hematoma and acute blood loss anemia.  He did have aspiration pneumonia, PAF and altered mental status.  He was in the hospital for 18 days but has since recuperated.  His Coumadin was changed to Eliquis.  He is currently asymptomatic although he has some back pain walks with a cane apparently needs a back injection by Dr. Herma Mering.  05/09/2016 . He worked at a [employer] in [place]. He has a history of CAD and PAD. He had coronary artery bypass grafting April 1999 and left carotid endarterectomy by Dr. Kellie Simmering 02/23/95. His problems include treated hypertension and hyperlipidemia. He did have a remote pulmonary embolus on Coumadin anticoagulation. His last Myoview stress test performed 06/12/12 was normal.  Since I saw him 2 years ago he has done well until last October he was hospitalized for chest wall hematoma, acute blood loss anemia complications that included aspiration pneumonia, altered mental status and PAF.  His Coumadin was transitioned to Eliquis.  Since that time he has been relatively asymptomatic.  He does have back pain and apparently needs a back injection.      Current Meds  Medication Sig  . AMLODIPINE BESYLATE PO Take 5 mg elemental calcium/kg/hr by mouth daily.  Marland Kitchen apixaban (ELIQUIS) 5 MG TABS tablet Take 1 tablet (5 mg total) by mouth 2 (two) times daily.  Marland Kitchen aspirin 81 MG tablet Take 1 tablet (81 mg total) by mouth daily.  . benazepril (LOTENSIN) 40 MG tablet Take 1 tablet (40 mg total) by mouth daily.  Marland Kitchen diltiazem (CARDIZEM CD) 120 MG 24 hr capsule Take 1 capsule (120 mg  total) by mouth daily.  . ferrous sulfate 325 (65 FE) MG tablet Take 1 tablet (325 mg total) by mouth 2 (two) times daily with a meal.  . glimepiride (AMARYL) 4 MG tablet Take 1 tablet (4 mg total) by mouth daily with breakfast.  . linagliptin (TRADJENTA) 5 MG TABS tablet Take 1 tablet (5 mg total) by mouth daily.  . protein supplement shake (PREMIER PROTEIN) LIQD Take 59.1 mLs (2 oz total) by mouth 2 (two) times daily between meals.  . simvastatin (ZOCOR) 20 MG tablet Take 0.5 tablets (10 mg total) by mouth at bedtime.  . [DISCONTINUED] aspirin EC 325 MG EC tablet Take 1 tablet (325 mg total) by mouth daily.     Allergies  Allergen Reactions  . Lopressor [Metoprolol Tartrate] Other (See Comments)    Severe BP drop  . Lipitor [Atorvastatin]     Muscle aches    Social History   Socioeconomic History  . Marital status: Married    Spouse name: Not on file  . Number of children: Not on file  . Years of education: Not on file  . Highest education level: Not on file  Occupational History  . Not on file  Social Needs  . Financial resource strain: Not on file  . Food insecurity:    Worry: Not on file    Inability: Not on file  . Transportation needs:    Medical: Not on file    Non-medical:  Not on file  Tobacco Use  . Smoking status: Never Smoker  . Smokeless tobacco: Never Used  Substance and Sexual Activity  . Alcohol use: No  . Drug use: Not on file  . Sexual activity: Not on file  Lifestyle  . Physical activity:    Days per week: Not on file    Minutes per session: Not on file  . Stress: Not on file  Relationships  . Social connections:    Talks on phone: Not on file    Gets together: Not on file    Attends religious service: Not on file    Active member of club or organization: Not on file    Attends meetings of clubs or organizations: Not on file    Relationship status: Not on file  . Intimate partner violence:    Fear of current or ex partner: Not on file     Emotionally abused: Not on file    Physically abused: Not on file    Forced sexual activity: Not on file  Other Topics Concern  . Not on file  Social History Narrative  . Not on file     Review of Systems: General: negative for chills, fever, night sweats or weight changes.  Cardiovascular: negative for chest pain, dyspnea on exertion, edema, orthopnea, palpitations, paroxysmal nocturnal dyspnea or shortness of breath Dermatological: negative for rash Respiratory: negative for cough or wheezing Urologic: negative for hematuria Abdominal: negative for nausea, vomiting, diarrhea, bright red blood per rectum, melena, or hematemesis Neurologic: negative for visual changes, syncope, or dizziness All other systems reviewed and are otherwise negative except as noted above.    Blood pressure 124/72, pulse 94, height 5\' 6"  (1.676 m), weight 126 lb (57.2 kg).  General appearance: alert and no distress Neck: no adenopathy, no carotid bruit, no JVD, supple, symmetrical, trachea midline and thyroid not enlarged, symmetric, no tenderness/mass/nodules Lungs: clear to auscultation bilaterally Heart: Soft outflow tract murmur consistent with aortic stenosis. Extremities: extremities normal, atraumatic, no cyanosis or edema Pulses: 2+ and symmetric Skin: Skin color, texture, turgor normal. No rashes or lesions Neurologic: Alert and oriented X 3, normal strength and tone. Normal symmetric reflexes. Normal coordination and gait  EKG not performed today  ASSESSMENT AND PLAN:   Acute pulmonary embolism History of remote pulmonary embolism on Eliquis oral anticoagulation.  Essential hypertension History of essential hypertension her blood pressure measured at 124/72.  He is on amlodipine benazepril meds at current dosing.  Hyperlipidemia History of hyperlipidemia on statin therapy  Coronary artery disease due to lipid rich plaque History of CAD status post coronary artery bypass grafting in  1999.  Last Myoview performed 06/12/2012 was normal.  He is otherwise asymptomatic.  Carotid artery disease History of carotid artery disease status post left carotid endarterectomy performed by Dr. Kellie Simmering 02/23/1995 which has been followed by duplex ultrasound regularly.  Paroxysmal atrial fibrillation (HCC) History of PAF during his hospitalization in October of last year in the setting of acute blood loss anemia.  He has had no recurrence  Mild aortic stenosis 2D echocardiogram performed 03/24/2017 revealed normal EF with mild aortic stenosis and a peak gradient of 29 mmHg with a valve area of 1.52 cm.  We will continue to follow this on an annual basis by 2D echocardiography.      Lorretta Harp MD FACP,FACC,FAHA, [facility] 12/17/2017 4:18 PM

## 2017-12-17 NOTE — Assessment & Plan Note (Signed)
History of essential hypertension her blood pressure measured at 124/72.  He is on amlodipine benazepril meds at current dosing.

## 2017-12-17 NOTE — Assessment & Plan Note (Signed)
History of CAD status post coronary artery bypass grafting in 1999.  Last Myoview performed 06/12/2012 was normal.  He is otherwise asymptomatic.

## 2017-12-17 NOTE — Assessment & Plan Note (Signed)
History of PAF during his hospitalization in October of last year in the setting of acute blood loss anemia.  He has had no recurrence

## 2017-12-17 NOTE — Assessment & Plan Note (Signed)
History of hyperlipidemia on statin therapy. 

## 2017-12-17 NOTE — Assessment & Plan Note (Signed)
2D echocardiogram performed 03/24/2017 revealed normal EF with mild aortic stenosis and a peak gradient of 29 mmHg with a valve area of 1.52 cm.  We will continue to follow this on an annual basis by 2D echocardiography.

## 2017-12-17 NOTE — Assessment & Plan Note (Signed)
History of carotid artery disease status post left carotid endarterectomy performed by Dr. Kellie Simmering 02/23/1995 which has been followed by duplex ultrasound regularly.

## 2017-12-18 DIAGNOSIS — M545 Low back pain: Secondary | ICD-10-CM | POA: Diagnosis not present

## 2017-12-24 DIAGNOSIS — M545 Low back pain: Secondary | ICD-10-CM | POA: Diagnosis not present

## 2017-12-27 DIAGNOSIS — M545 Low back pain: Secondary | ICD-10-CM | POA: Diagnosis not present

## 2017-12-31 DIAGNOSIS — M5136 Other intervertebral disc degeneration, lumbar region: Secondary | ICD-10-CM | POA: Insufficient documentation

## 2018-01-07 ENCOUNTER — Telehealth: Payer: Self-pay | Admitting: Cardiovascular Disease

## 2018-01-07 DIAGNOSIS — M545 Low back pain: Secondary | ICD-10-CM | POA: Diagnosis not present

## 2018-01-07 NOTE — Telephone Encounter (Signed)
We already know that he has a history of PAF and is already on Eliquis.

## 2018-01-07 NOTE — Telephone Encounter (Signed)
Advised daughter, verbalized understanding. Will call back if any further issues.

## 2018-01-07 NOTE — Telephone Encounter (Signed)
Spoke with patients daughter, ok per DPR. Daughter stated that patient has had 3 episodes in the last month. Twice after swim therapy and once after showering he felt bad like his blood pressure had dropped. During this time he feels weak/no energy, no other symptoms. Denies feeling like heart is racing or beating irregular.  Episode does not last long and then feels fine. Today he was at water therapy and had blood pressure taken. Therapist who took blood pressure said heart rate sounded irregular like possibly Afib. Per daughter therapist did emphasize she was no doctor and not diagnosing him, she just felt like call doctors office.  Daughter wanted patient seen this week, going [place]. She would be ok with patient wearing a monitor. Patient taking Diltiazem and Eliquis as prescribed. Will forward to Dr [name] for review

## 2018-01-16 ENCOUNTER — Telehealth: Payer: Self-pay | Admitting: Cardiovascular Disease

## 2018-01-16 DIAGNOSIS — Z6822 Body mass index (BMI) 22.0-22.9, adult: Secondary | ICD-10-CM | POA: Diagnosis not present

## 2018-01-16 DIAGNOSIS — R05 Cough: Secondary | ICD-10-CM | POA: Diagnosis not present

## 2018-01-16 DIAGNOSIS — D539 Nutritional anemia, unspecified: Secondary | ICD-10-CM | POA: Diagnosis not present

## 2018-01-16 DIAGNOSIS — N189 Chronic kidney disease, unspecified: Secondary | ICD-10-CM | POA: Diagnosis not present

## 2018-01-16 NOTE — Telephone Encounter (Signed)
.  Patient c/o Palpitations:  High priority if patient c/o lightheadedness, shortness of breath, or chest pain  1) How long have you had palpitations/irregular HR/ Afib? Are you having the symptoms now? A few weeks ago and now it is getting worse- wonder if he needs to increase his medicine  2) Are you currently experiencing lightheadedness, SOB or CP? no  3) Do you have a history of afib (atrial fibrillation) or irregular heart rhythm? yes  4) Have you checked your BP or HR? (document readings if available): no  5) Are you experiencing any other symptoms? no

## 2018-01-16 NOTE — Telephone Encounter (Signed)
Patient c/o feeling weak and drained for several days. No c/o palpitations, chest pain, dizziness or sob. BP 112/69 & 117/71 & HR 63-67. Patient said he is eating and drinking okay. Medications reconciled. Noted that patient is taking 325 mg aspirin daily vs 81 mg and also taking 1/2 of 325 mg ferrous sulfate daily. Patient advised to contact his PCP about his symptoms and that if his pcp felt his problem was cardiac related, to contact our office back for an appointment. Patient advised that if his symptoms get worse, to go to the ED for an evaluation. Verbalized understanding of plan.

## 2018-01-21 ENCOUNTER — Telehealth: Payer: Self-pay | Admitting: Anesthesiology

## 2018-01-21 NOTE — Telephone Encounter (Signed)
Pt daughter called again aware that Dr. [name] knows he is in AFIB but still reporting that the pt is still experiencing episodes of increased fatigue that he is relating to his Afib. He says it has been happening more frequently but his vitals stay stable. His BP has been 126/70, hr 70. She says that he complains of a strange feeling in his chest lasting 3-5 minutes but the fatigue that occurs afterwards lasts over 30-40 minutes. She has spoken to his PMD and they have checked his sugar and FE levels and they have been normal. She is asking for a monitor to be sure that AFIB is all that is happening during these episodes. Pt denies chest pain, sob, dizziness. I advised her that I will message Dr. [name] again to see if he has any further recommendations in regards to having a monitor placed on the pt.

## 2018-01-21 NOTE — Telephone Encounter (Signed)
Spoke with pt dtr, follow up appointment made to discuss. They will bring a log of how often he is going into atrial fib.

## 2018-01-21 NOTE — Telephone Encounter (Signed)
New Message       Patient's daughter called states patient is still going into A-fib and wants to know what the next step is? Pls advise.

## 2018-01-28 ENCOUNTER — Ambulatory Visit: Payer: Medicare HMO | Admitting: Cardiovascular Disease

## 2018-01-28 ENCOUNTER — Encounter: Payer: Self-pay | Admitting: Cardiovascular Disease

## 2018-01-28 VITALS — BP 108/56 | HR 61 | Ht 66.0 in | Wt 131.0 lb

## 2018-01-28 DIAGNOSIS — R0989 Other specified symptoms and signs involving the circulatory and respiratory systems: Secondary | ICD-10-CM | POA: Diagnosis not present

## 2018-01-28 DIAGNOSIS — R011 Cardiac murmur, unspecified: Secondary | ICD-10-CM | POA: Diagnosis not present

## 2018-01-28 DIAGNOSIS — R5383 Other fatigue: Secondary | ICD-10-CM

## 2018-01-28 DIAGNOSIS — I480 Paroxysmal atrial fibrillation: Secondary | ICD-10-CM | POA: Diagnosis not present

## 2018-01-28 NOTE — Assessment & Plan Note (Signed)
History of mild aortic stenosis 2D echocardiogram performed 03/24/2017.  He does have a high-pitched outflow tract murmur episodes of found fatigue.  I am going to repeat the 2D echo.

## 2018-01-28 NOTE — Progress Notes (Signed)
01/28/2018 [name]   December 17, 1940  [id number]  Primary Physician [name], NP Primary Cardiologist: Lorretta Harp MD Bryan Carney, [place]  HPI:  Bryan Carney is a 77 y.o.   thin-appearing married Caucasian male father of one daughter, grandfather of 2 grandchildren who is accompanied by hisdaughter Bryan Carney.  I last saw him in the office 12/17/2017. he was admitted to the hospital in October 2018 with chest wall hematoma and acute blood loss anemia.  He did have aspiration pneumonia, PAF and altered mental status.  He was in the hospital for 18 days but has since recuperated.  His Coumadin was changed to Eliquis.  He is currently asymptomatic although he has some back pain walks with a cane apparently needs a back injection by Dr. Herma Mering.  05/09/2016. He worked at a [employer] in [place]. He has a history of CAD and PAD. He had coronary artery bypass grafting April 1999 and left carotid endarterectomy by Dr. Kellie Simmering 02/23/95. His problems include treated hypertension and hyperlipidemia. He did have a remote pulmonary embolus on Coumadin anticoagulation. His last Myoview stress test performed 06/12/12 was normal.  Since I saw him 2 years ago he has done well until last October he was hospitalized for chest wall hematoma, acute blood loss anemia complications that included aspiration pneumonia, altered mental status and PAF.  His Coumadin was transitioned to Eliquis.  Since that time he has been relatively asymptomatic.  He does have back pain and recently had a back injection by Dr. Nelva Bush.  He did stop his Eliquis several days in advance. Since I saw him 2 months ago he had episodes of profound fatigue potentially related to PAF.  He continues to deny chest pain or shortness of breath.   No outpatient medications have been marked as taking for the 01/28/18 encounter (Office Visit) with Lorretta Harp, MD.     Allergies  Allergen Reactions  . Lopressor [Metoprolol  Tartrate] Other (See Comments)    Severe BP drop  . Lipitor [Atorvastatin]     Muscle aches    Social History   Socioeconomic History  . Marital status: Married    Spouse name: Not on file  . Number of children: Not on file  . Years of education: Not on file  . Highest education level: Not on file  Occupational History  . Not on file  Social Needs  . Financial resource strain: Not on file  . Food insecurity:    Worry: Not on file    Inability: Not on file  . Transportation needs:    Medical: Not on file    Non-medical: Not on file  Tobacco Use  . Smoking status: Never Smoker  . Smokeless tobacco: Never Used  Substance and Sexual Activity  . Alcohol use: No  . Drug use: Not on file  . Sexual activity: Not on file  Lifestyle  . Physical activity:    Days per week: Not on file    Minutes per session: Not on file  . Stress: Not on file  Relationships  . Social connections:    Talks on phone: Not on file    Gets together: Not on file    Attends religious service: Not on file    Active member of club or organization: Not on file    Attends meetings of clubs or organizations: Not on file    Relationship status: Not on file  . Intimate partner violence:  Fear of current or ex partner: Not on file    Emotionally abused: Not on file    Physically abused: Not on file    Forced sexual activity: Not on file  Other Topics Concern  . Not on file  Social History Narrative  . Not on file     Review of Systems: General: negative for chills, fever, night sweats or weight changes.  Cardiovascular: negative for chest pain, dyspnea on exertion, edema, orthopnea, palpitations, paroxysmal nocturnal dyspnea or shortness of breath Dermatological: negative for rash Respiratory: negative for cough or wheezing Urologic: negative for hematuria Abdominal: negative for nausea, vomiting, diarrhea, bright red blood per rectum, melena, or hematemesis Neurologic: negative for visual  changes, syncope, or dizziness All other systems reviewed and are otherwise negative except as noted above.    Blood pressure (!) 108/56, pulse 61, height 5\' 6"  (1.676 m), weight 131 lb (59.4 kg).  General appearance: alert and no distress Neck: no adenopathy, no JVD, supple, symmetrical, trachea midline, thyroid not enlarged, symmetric, no tenderness/mass/nodules and Soft bilateral carotid bruits Lungs: clear to auscultation bilaterally Heart: regularly irregular rhythm and Soft outflow tract murmur consistent with aortic stenosis. Extremities: extremities normal, atraumatic, no cyanosis or edema Pulses: 2+ and symmetric Skin: Skin color, texture, turgor normal. No rashes or lesions Neurologic: Alert and oriented X 3, normal strength and tone. Normal symmetric reflexes. Normal coordination and gait  EKG sinus rhythm at 61 with left atrial enlargement and borderline LVH.  I personally reviewed this EKG.  ASSESSMENT AND PLAN:   Paroxysmal atrial fibrillation (HCC) History of PAF on Eliquis.  He said episodes of tachypalpitations followed by profound fatigue.  I am going to get a 2D echo, carotid Doppler study and 30-day event monitor I will see him back after that for further evaluation.  He has an outflow tract murmur on exam as well as bilateral carotid bruits.  Coronary artery disease involving native coronary artery of native heart without angina pectoris History of CAD status post bypass grafting in [place] performed 06/12/2012 which was normal.  He denies chest pain.  Mild aortic stenosis History of mild aortic stenosis 2D echocardiogram performed 03/24/2017.  He does have a high-pitched outflow tract murmur episodes of found fatigue.  I am going to repeat the 2D echo.      Lorretta Harp MD FACP,FACC,FAHA, [facility] 01/28/2018 4:08 PM

## 2018-01-28 NOTE — Assessment & Plan Note (Signed)
History of PAF on Eliquis.  He said episodes of tachypalpitations followed by profound fatigue.  I am going to get a 2D echo, carotid Doppler study and 30-day event monitor I will see him back after that for further evaluation.  He has an outflow tract murmur on exam as well as bilateral carotid bruits.

## 2018-01-28 NOTE — Patient Instructions (Signed)
Medication Instructions:  Your physician recommends that you continue on your current medications as directed. Please refer to the Current Medication list given to you today.   Labwork: none  Testing/Procedures: Your physician has recommended that you wear an event monitor. Event monitors are medical devices that record the heart's electrical activity. Doctors most often [name] these monitors to diagnose arrhythmias. Arrhythmias are problems with the speed or rhythm of the heartbeat. The monitor is a small, portable device. You can wear one while you do your normal daily activities. This is usually used to diagnose what is causing palpitations/syncope (passing out).  Your physician has requested that you have an echocardiogram. Echocardiography is a painless test that uses sound waves to create images of your heart. It provides your doctor with information about the size and shape of your heart and how well your heart's chambers and valves are working. This procedure takes approximately one hour. There are no restrictions for this procedure.  Your physician has requested that you have a carotid duplex. This test is an ultrasound of the carotid arteries in your neck. It looks at blood flow through these arteries that supply the brain with blood. Allow one hour for this exam. There are no restrictions or special instructions.   Follow-Up: Your physician recommends that you schedule a follow-up appointment in: 8 weeks with Dr. [name].   Any Other Special Instructions Will Be Listed Below (If Applicable).     If you need a refill on your cardiac medications before your next appointment, please call your pharmacy.

## 2018-01-28 NOTE — Assessment & Plan Note (Signed)
History of CAD status post bypass grafting in [place] performed 06/12/2012 which was normal.  He denies chest pain.

## 2018-02-06 ENCOUNTER — Telehealth: Payer: Self-pay | Admitting: Cardiology

## 2018-02-06 ENCOUNTER — Ambulatory Visit (INDEPENDENT_AMBULATORY_CARE_PROVIDER_SITE_OTHER): Payer: Medicare HMO

## 2018-02-06 DIAGNOSIS — R5383 Other fatigue: Secondary | ICD-10-CM | POA: Diagnosis not present

## 2018-02-06 DIAGNOSIS — R0989 Other specified symptoms and signs involving the circulatory and respiratory systems: Secondary | ICD-10-CM | POA: Diagnosis not present

## 2018-02-06 DIAGNOSIS — R011 Cardiac murmur, unspecified: Secondary | ICD-10-CM

## 2018-02-06 DIAGNOSIS — I480 Paroxysmal atrial fibrillation: Secondary | ICD-10-CM | POA: Diagnosis not present

## 2018-02-06 NOTE — Telephone Encounter (Signed)
Called by [employer] for abnormal EKG. Patient noted to have short period of Aflutter at a rate of 70bpm. In review of chart he is in Dilt and Eliquis per his recent visit with Dr. [name]. Patient was asymptomatic. No changes made at this time.   Reino Bellis Np

## 2018-02-08 ENCOUNTER — Telehealth: Payer: Self-pay | Admitting: Cardiology

## 2018-02-08 NOTE — Telephone Encounter (Signed)
Monitor company called. Pt had 16 bts "NSVT". I spoke with the patient. He could tell he had this and felt tired afterwards. His baseline rhythm is AF with VR 80's. He has normal LVF by echo. In the past he has had problems with hypotension from Lopressor (?). I suggested we try Atenolol 12.5 mg daily and I called this in to the [facility] on [address] in [place]. , I also told him to decrease his ASA to 81 mg daily.   Kerin Ransom PA-C 02/08/2018 2:20 PM

## 2018-02-10 ENCOUNTER — Telehealth: Payer: Self-pay

## 2018-02-10 NOTE — Telephone Encounter (Signed)
Called patient we received monitor report today revealing SVT on 02/08/18 at 11:55 am.Also on 02/08/18 at 4:38 pm 4.3 sec pause.

## 2018-02-11 NOTE — Telephone Encounter (Signed)
DOD Dr.Croitoru reviewed monitor strips he advised avoid beta blockers due to 4.3 sec pause.He advised schedule appointment with EP.Daughter [name] Child was also notified per patient's request.Left voice message with EP scheduler Melissa to schedule appointment.

## 2018-02-20 ENCOUNTER — Encounter: Payer: Self-pay | Admitting: Cardiology

## 2018-02-20 ENCOUNTER — Ambulatory Visit: Payer: Medicare HMO | Admitting: Cardiology

## 2018-02-20 VITALS — BP 112/68 | HR 69 | Ht 66.0 in | Wt 130.4 lb

## 2018-02-20 DIAGNOSIS — I495 Sick sinus syndrome: Secondary | ICD-10-CM

## 2018-02-20 DIAGNOSIS — I2510 Atherosclerotic heart disease of native coronary artery without angina pectoris: Secondary | ICD-10-CM

## 2018-02-20 DIAGNOSIS — I480 Paroxysmal atrial fibrillation: Secondary | ICD-10-CM

## 2018-02-20 DIAGNOSIS — Z01812 Encounter for preprocedural laboratory examination: Secondary | ICD-10-CM

## 2018-02-20 NOTE — Progress Notes (Signed)
Electrophysiology Office Note   Date:  02/20/2018   ID:  Bryan, Carney February 04, 1941, MRN [id number]  PCP:  [name], NP  Cardiologist:  [name] Primary Electrophysiologist:  Venera Privott Meredith Leeds, MD    No chief complaint on file.    History of Present Illness: Bryan Carney is a 77 y.o. male who is being seen today for the evaluation of tachy/brady syndrome at the request of Bryan Carney. Presenting today for electrophysiology evaluation.  He has a history of paroxysmal atrial fibrillation, coronary artery disease, diabetes, pulmonary embolism, hypertension, hyperlipidemia, pulmonary venoocclusive disease.  He was admitted to the hospital 12/17/2017 with a chest wall hematoma and acute blood loss anemia.  He was found to have aspiration pneumonia, paroxysmal atrial fibrillation and altered mental status.  His Coumadin at the time was changed to Eliquis.  He had a CABG in 1999 and left carotid endarterectomy in 1996.  Presents today with symptomatic atrial fibrillation.  He is in and out of atrial fibrillation quite a bit.  He feels well today, but when he is in atrial fibrillation he has weakness, fatigue, and shortness of breath.  He has no palpitations.  He wore a cardiac monitor that showed atrial fibrillation and atrial flutter as well as up to a 4.3-second pause as well as significant junctional bradycardia into the 30s during waking hours.  He also had some nonsustained VT.  Today, he denies symptoms of palpitations, chest pain, shortness of breath, orthopnea, PND, lower extremity edema, claudication, dizziness, presyncope, syncope, bleeding, or neurologic sequela. The patient is tolerating medications without difficulties.    Past Medical History:  Diagnosis Date  . CAD (coronary artery disease)   . Diabetes ([place])   . Hx pulmonary embolism   . Hyperlipidemia   . Hypertension   . Prostate cancer ([place])   . PVOD (pulmonary veno-occlusive disease) ([place])   . Squamous cell  carcinoma    Past Surgical History:  Procedure Laterality Date  . BACK SURGERY    . CARDIAC CATHETERIZATION  10/15/1997   Recommend complete revascularization by CABG  . CARDIOVASCULAR STRESS TEST  06/12/2012   No evidence of ischemia  . CAROTID DOPPLER  08/27/2012   Rt bulb/proximal ICA demonstrated a mild-moderate amount of fibrous plaque without evidence of a significant diameter reduction or any other vascular abnormality.  . CAROTID ENDARTERECTOMY Left 02/23/1995  . CORONARY ARTERY BYPASS GRAFT  10/19/1997   x5. LIMA-LAD, SVG-PDA, SVG-OM1, seq SVG-fist and second branches of ramus intermedius.     Current Outpatient Medications  Medication Sig Dispense Refill  . apixaban (ELIQUIS) 5 MG TABS tablet Take 1 tablet (5 mg total) by mouth 2 (two) times daily. 60 tablet 0  . aspirin EC 81 MG tablet Take 81 mg by mouth daily.    . benazepril (LOTENSIN) 40 MG tablet Take 1 tablet (40 mg total) by mouth daily. 30 tablet 0  . diltiazem (CARDIZEM CD) 120 MG 24 hr capsule Take 1 capsule (120 mg total) by mouth daily. 30 capsule 0  . ferrous sulfate 325 (65 FE) MG tablet Take 1 tablet (325 mg total) by mouth 2 (two) times daily with a meal. (Patient taking differently: Take 162.5 mg by mouth daily with breakfast. ) 60 tablet 3  . glimepiride (AMARYL) 4 MG tablet Take 1 tablet (4 mg total) by mouth daily with breakfast. 30 tablet 0  . linagliptin (TRADJENTA) 5 MG TABS tablet Take 1 tablet (5 mg total) by mouth daily. 30 tablet  0  . protein supplement shake (PREMIER PROTEIN) LIQD Take 59.1 mLs (2 oz total) by mouth 2 (two) times daily between meals.  0  . simvastatin (ZOCOR) 20 MG tablet Take 0.5 tablets (10 mg total) by mouth at bedtime. 30 tablet 0   No current facility-administered medications for this visit.     Allergies:   Lopressor [metoprolol tartrate] and Lipitor [atorvastatin]   Social History:  The patient  reports that he has never smoked. He has never used smokeless tobacco. He reports  that he does not drink alcohol.   Family History:  The patient's family history includes Cancer in his sister and sister; Diabetes in his brother, brother, brother, and brother; Heart attack in his father.    ROS:  Please see the histor palpitations, cough y of present illness.   Otherwise, review of systems is positive for this, fatigue, shortness of breath.   All other systems are reviewed and negative.    PHYSICAL EXAM: VS:  BP 112/68   Pulse 69   Ht 5\' 6"  (1.676 m)   Wt 130 lb 6.4 oz (59.1 kg)   SpO2 98%   BMI 21.05 kg/m  , BMI Body mass index is 21.05 kg/m. GEN: Well nourished, well developed, in no acute distress  HEENT: normal  Neck: no JVD, carotid bruits, or masses Cardiac: RRR; no murmurs, rubs, or gallops,no edema  Respiratory:  clear to auscultation bilaterally, normal work of breathing GI: soft, nontender, nondistended, + BS MS: no deformity or atrophy  Skin: warm and dry Neuro:  Strength and sensation are intact Psych: euthymic mood, full affect  EKG:  EKG is not ordered today. Personal review of the ekg ordered  01/28/18 shows sinus rhythm, left atrial enlargement, rate 61  Recent Labs: 03/22/2017: Magnesium 1.9; TSH 1.276 03/25/2017: B Natriuretic Peptide 410.3 03/28/2017: ALT 30 04/03/2017: BUN 25; Creatinine, Ser 1.41; Hemoglobin 9.4; Platelets 502; Potassium 4.0; Sodium 135    Lipid Panel  No results found for: CHOL, TRIG, HDL, CHOLHDL, VLDL, LDLCALC, LDLDIRECT   Wt Readings from Last 3 Encounters:  02/20/18 130 lb 6.4 oz (59.1 kg)  01/28/18 131 lb (59.4 kg)  12/17/17 126 lb (57.2 kg)      Other studies Reviewed: Additional studies/ records that were reviewed today include: TTE 03/24/17  Review of the above records today demonstrates:  - Left ventricle: The cavity size was normal. Wall thickness was   increased in a pattern of moderate LVH. Systolic function was   normal. The estimated ejection fraction was in the range of 50%   to 55%. Wall motion  was normal; there were no regional wall   motion abnormalities. - Aortic valve: Trileaflet; mildly thickened, mildly calcified   leaflets. Valve mobility was moderately restricted. Transvalvular   velocity was increased. There was mild to moderate stenosis.   Valve area (VTI): 1.52 cm^2. Valve area (Vmax): 1.56 cm^2. Valve   area (Vmean): 1.4 cm^2. - Aortic root: The aortic root was mildly dilated. - Mitral valve: Mildly to moderately calcified annulus. There was   mild to moderate regurgitation. - Left atrium: The atrium was moderately dilated.  30-day monitor personally reviewed atrial fibrillation and atrial flutter with significant pauses as well as significant junctional bradycardia.  ASSESSMENT AND PLAN:  1.  Paroxysmal atrial fibrillation: Currently on Eliquis.  Is continued to have tachycardia palpitations and fatigue.  He wore a 30-day monitor that showed significant pauses as well as bradycardia.  We Aiva Miskell plan for pacemaker implant.  This patients CHA2DS2-VASc Score and unadjusted Ischemic Stroke Rate (% per year) is equal to 7.2 % stroke rate/year from a score of 5  Above score calculated as 1 point each if present [CHF, HTN, DM, Vascular=MI/PAD/Aortic Plaque, Age if 65-74, or Male] Above score calculated as 2 points each if present [Age > 75, or Stroke/TIA/TE]  2.  Coronary artery disease: Status post CABG in 1999.  No current chest pain  3.  Mild aortic stenosis: Found on echo in 2018.  Plan for repeat echo.  4.  Tachybradycardia syndrome: Found on 30-day monitor.  I discussed with him the possibility of pacemaker implant.  Risks and benefits were discussed and include bleeding, tamponade, infection, pneumothorax.  The patient understands these risks and agrees to the procedure.  Current medicines are reviewed at length with the patient today.   The patient does not have concerns regarding his medicines.  The following changes were made today:  none  Labs/ tests  ordered today include:  Orders Placed This Encounter  Procedures  . CBC  . Basic Metabolic Panel (BMET)   Case discussed with primary cardiology  Disposition:   FU with Alvina Strother 3 months  Signed, Monserath Neff Meredith Leeds, MD  02/20/2018 12:46 PM     [facility] [address] [place] [id number] [phone] (office) [phone] (fax)

## 2018-02-20 NOTE — Patient Instructions (Addendum)
Medication Instructions:  Your physician recommends that you continue on your current medications as directed. Please refer to the Current Medication list given to you today.     * If you need a refill on your cardiac medications before your next appointment, please call your pharmacy. *   Labwork: Pre procedure lab work today: BMET & CBC w/ diff * Will notify you of abnormal results, otherwise continue current treatment plan.*   Testing/Procedures: Your physician has recommended that you have a pacemaker inserted. A pacemaker is a small device that is placed under the skin of your chest or abdomen to help control abnormal heart rhythms. This device uses electrical pulses to prompt the heart to beat at a normal rate. Pacemakers are used to treat heart rhythms that are too slow. Wire (leads) are attached to the pacemaker that goes into the chambers of you heart. This is done in the hospital and usually requires and overnight stay. Please follow the instructions below, located under the special instructions section.   Follow-Up: Your physician recommends that you schedule a wound check appointment 10-14 days, after your procedure on 02/27/2018, with the device clinic.  Your physician recommends that you schedule a follow up appointment in 91 days, after your procedure on 02/27/2018, with Dr. [name].  * Please note that any paperwork needing to be filled out by the provider will need to be addressed at the front desk prior to seeing the provider.  Please note that any FMLA, disability or other documents regarding health condition is subject to a $25.00 charge that must be received prior to completion of paperwork in the form of a money order or check. *  Thank you for choosing [facility]!!   Trinidad Curet, RN [phone]   Any Other Special Instructions Will Be Listed Below (If Applicable).     Implantable Device Instructions  You are scheduled for:                  _____  Permanent Transvenous Pacemaker  on  02/27/2018  with Dr. [name].  1.   Please arrive at the [facility], Entrance "A"  at [facility] at  7:30 a.m. on the day of your procedure. (The address is [address])  2. Do not eat or drink after midnight the night before your procedure.  3.   Complete pre procedure  lab work on 02/20/2018.   You do not have to be fasting.  4.   A) - Take your last dose of Eliquis on the morning of 9/4.             - Hold the following medications the morning of your procedure:   1. Glimepiride    2. Tradjenta           - All of your remaining medications may be taken with a small amount of water the  morning of your procedure.  5.  Plan for an overnight stay.  Bring your insurance cards and a list of you medications.  6.  Wash your chest and neck with surgical scrub the evening before and the morning of      your procedure.  Rinse well. Please review the surgical scrub instruction sheet given       to you.  7. Your chest will need to be shaved prior to this procedure (if needed). We ask that you do this yourself at home 1 to 2 days before or if uncomfortable/unable to do  yourself, then it will be performed by the hospital staff the day of (if needed).                                                                                                                * If you have ANY questions after you get home, please call Trinidad Curet, RN @ [phone].  * Every attempt is made to prevent procedures from being rescheduled.  Due to the nature of  Electrophysiology, rescheduling can happen.  The physician is always aware and directs the staff when this occurs.     Pacemaker Implantation, Adult Pacemaker implantation is a procedure to place a pacemaker inside your chest. A pacemaker is a small computer that sends electrical signals to the heart and helps your heart beat normally. A pacemaker also stores information about your heart rhythms. You  may need pacemaker implantation if you:  Have a slow heartbeat (bradycardia).  Faint (syncope).  Have shortness of breath (dyspnea) due to heart problems.  The pacemaker attaches to your heart through a wire, called a lead. Sometimes just one lead is needed. Other times, there will be two leads. There are two types of pacemakers:  Transvenous pacemaker. This type is placed under the skin or muscle of your chest. The lead goes through a vein in the chest area to reach the inside of the heart.  Epicardial pacemaker. This type is placed under the skin or muscle of your chest or belly. The lead goes through your chest to the outside of the heart.  Tell a health care provider about:  Any allergies you have.  All medicines you are taking, including vitamins, herbs, eye drops, creams, and over-the-counter medicines.  Any problems you or family members have had with anesthetic medicines.  Any blood or bone disorders you have.  Any surgeries you have had.  Any medical conditions you have.  Whether you are pregnant or may be pregnant. What are the risks? Generally, this is a safe procedure. However, problems may occur, including:  Infection.  Bleeding.  Failure of the pacemaker or the lead.  Collapse of a lung or bleeding into a lung.  Blood clot inside a blood vessel with a lead.  Damage to the heart.  Infection inside the heart (endocarditis).  Allergic reactions to medicines.  What happens before the procedure? Staying hydrated Follow instructions from your health care provider about hydration, which may include:  Up to 2 hours before the procedure - you may continue to drink clear liquids, such as water, clear fruit juice, black coffee, and plain tea.  Eating and drinking restrictions Follow instructions from your health care provider about eating and drinking, which may include:  8 hours before the procedure - stop eating heavy meals or foods such as meat, fried  foods, or fatty foods.  6 hours before the procedure - stop eating light meals or foods, such as toast or cereal.  6 hours before the procedure - stop drinking milk or drinks that contain milk.  2 hours before the procedure - stop drinking clear liquids.  Medicines  Ask your health care provider about: ? Changing or stopping your regular medicines. This is especially important if you are taking diabetes medicines or blood thinners. ? Taking medicines such as aspirin and ibuprofen. These medicines can thin your blood. Do not take these medicines before your procedure if your health care provider instructs you not to.  You may be given antibiotic medicine to help prevent infection. General instructions  You will have a heart evaluation. This may include an electrocardiogram (ECG), chest X-ray, and heart imaging (echocardiogram,  or echo) tests.  You will have blood tests.  Do not use any products that contain nicotine or tobacco, such as cigarettes and e-cigarettes. If you need help quitting, ask your health care provider.  Plan to have someone take you home from the hospital or clinic.  If you will be going home right after the procedure, plan to have someone with you for 24 hours.  Ask your health care provider how your surgical site will be marked or identified. What happens during the procedure?  To reduce your risk of infection: ? Your health care team will wash or sanitize their hands. ? Your skin will be washed with soap. ? Hair may be removed from the surgical area.  An IV tube will be inserted into one of your veins.  You will be given one or more of the following: ? A medicine to help you relax (sedative). ? A medicine to numb the area (local anesthetic). ? A medicine to make you fall asleep (general anesthetic).  If you are getting a transvenous pacemaker: ? An incision will be made in your upper chest. ? A pocket will be made for the pacemaker. It may be placed  under the skin or between layers of muscle. ? The lead will be inserted into a blood vessel that returns to the heart. ? While X-rays are taken by an imaging machine (fluoroscopy), the lead will be advanced through the vein to the inside of your heart. ? The other end of the lead will be tunneled under the skin and attached to the pacemaker.  If you are getting an epicardial pacemaker: ? An incision will be made near your ribs or breastbone (sternum) for the lead. ? The lead will be attached to the outside of your heart. ? Another incision will be made in your chest or upper belly to create a pocket for the pacemaker. ? The free end of the lead will be tunneled under the skin and attached to the pacemaker.  The transvenous or epicardial pacemaker will be tested. Imaging studies may be done to check the lead position.  The incisions will be closed with stitches (sutures), adhesive strips, or skin glue.  Bandages (dressing) will be placed over the incisions. The procedure may vary among health care providers and hospitals. What happens after the procedure?  Your blood pressure, heart rate, breathing rate, and blood oxygen level will be monitored until the medicines you were given have worn off.  You will be given antibiotics and pain medicine.  ECG and chest x-rays will be done.  You will wear a continuous type of ECG (Holter monitor) to check your heart rhythm.  Your health care provider willprogram the pacemaker.  Do not drive for 24 hours if you received a sedative. This information is not intended to replace advice given to you by your health care provider. Make sure you discuss any  questions you have with your health care provider. Document Released: 06/01/2002 Document Revised: 12/30/2015 Document Reviewed: 11/23/2015 Elsevier Interactive Patient Education  2018 [employer].     Pacemaker Implantation, Adult, Care After This sheet gives you information about how to care  for yourself after your procedure. Your health care provider may also give you more specific instructions. If you have problems or questions, contact your health care provider. What can I expect after the procedure? After the procedure, it is common to have:  Mild pain.  Slight bruising.  Some swelling over the incision.  A slight bump over the skin where the device was placed. Sometimes, it is possible to feel the device under the skin. This is normal.  Follow these instructions at home: Medicines  Take over-the-counter and prescription medicines only as told by your health care provider.  If you were prescribed an antibiotic medicine, take it as told by your health care provider. Do not stop taking the antibiotic even if you start to feel better. Wound care  Do not remove the bandage on your chest until directed to do so by your health care provider.  After your bandage is removed, you may see pieces of tape called skin adhesive strips over the area where the cut was made (incision site). Let them fall off on their own.  Check the incision site every day to make sure it is not infected, bleeding, or starting to pull apart.  Do not use lotions or ointments near the incision site unless directed to do so.  Keep the incision area clean and dry for 2-3 days after the procedure or as directed by your health care provider. It takes several weeks for the incision site to completely heal.  Do not take baths, swim, or use a hot tub for 7-10 days or as otherwise directed by your health care provider. Activity  Do not drive or use heavy machinery while taking prescription pain medicine.  Do not drive for 24 hours if you were given a medicine to help you relax (sedative).  Check with your health care provider before you start to drive or play sports.  Avoid sudden jerking, pulling, or chopping movements that pull your upper arm far away from your body. Avoid these movements for at least  6 weeks or as long as told by your health care provider.  Do not lift your upper arm above your shoulders for at least 6 weeks or as long as told by your health care provider. This means no tennis, golf, or swimming.  You may go back to work when your health care provider says it is okay. Pacemaker care  You may be shown how to transfer data from your pacemaker through the phone to your health care provider.  Always let all health care providers know about your pacemaker before you have any medical procedures or tests.  Wear a medical ID bracelet or necklace stating that you have a pacemaker. Carry a pacemaker ID card with you at all times.  Your pacemaker battery will last for 5-15 years. Routine checks by your health care provider will let the health care provider know when the battery is starting to run down. The pacemaker will need to be replaced when the battery starts to run down.  Do not use amateur Chief of Staff. Other electrical devices are safe to use, including power tools, lawn mowers, and speakers. If you are unsure of whether something is safe to use, ask  your health care provider.  When using your cell phone, hold it to the ear opposite the pacemaker. Do not leave your cell phone in a pocket over the pacemaker.  Avoid places or objects that have a strong electric or magnetic field, including: ? Airport Herbalist. When at the airport, let officials know that you have a pacemaker. ? Power plants. ? Large electrical generators. ? Radiofrequency transmission towers, such as cell phone and radio towers. General instructions  Weigh yourself every day. If you suddenly gain weight, fluid may be building up in your body.  Keep all follow-up visits as told by your health care provider. This is important. Contact a health care provider if:  You gain weight suddenly.  Your legs or feet swell.  It feels like your heart is fluttering or skipping  beats (heart palpitations).  You have chills or a fever.  You have more redness, swelling, or pain around your incisions.  You have more fluid or blood coming from your incisions.  Your incisions feel warm to the touch.  You have pus or a bad smell coming from your incisions. Get help right away if:  You have chest pain.  You have trouble breathing or are short of breath.  You become extremely tired.  You are light-headed or you faint. This information is not intended to replace advice given to you by your health care provider. Make sure you discuss any questions you have with your health care provider. Document Released: 12/29/2004 Document Revised: 03/23/2016 Document Reviewed: 03/23/2016 Elsevier Interactive Patient Education  2018 [place] Discharge Instructions for  Pacemaker/Defibrillator Patients  Activity No heavy lifting or vigorous activity with your left/right arm for 6 to 8 weeks.  Do not raise your left/right arm above your head for one week.  Gradually raise your affected arm as drawn below.           __  NO DRIVING for     ; you may begin driving on     .  WOUND CARE - Keep the wound area clean and dry.  Do not get this area wet for one week. No showers for one week; you may shower on     . - The tape/steri-strips on your wound will fall off; do not pull them off.  No bandage is needed on the site.  DO  NOT apply any creams, oils, or ointments to the wound area. - If you notice any drainage or discharge from the wound, any swelling or bruising at the site, or you develop a fever > 101? F after you are discharged home, call the office at once.  Special Instructions - You are still able to use cellular telephones; use the ear opposite the side where you have your pacemaker/defibrillator.  Avoid carrying your cellular phone near your device. - When traveling through airports, show security personnel your identification card to avoid being  screened in the metal detectors.  Ask the security personnel to use the hand wand. - Avoid arc welding equipment, MRI testing (magnetic resonance imaging), TENS units (transcutaneous nerve stimulators).  Call the office for questions about other devices. - Avoid electrical appliances that are in poor condition or are not properly grounded. - Microwave ovens are safe to be near or to operate.  Additional information for defibrillator patients should your device go off: - If your device goes off ONCE and you feel fine afterward, notify the device clinic nurses. - If your device  goes off ONCE and you do not feel well afterward, call 911. - If your device goes off TWICE, call 911. - If your device goes off THREE times in one day, call 911.  DO NOT DRIVE YOURSELF OR A FAMILY MEMBER WITH A DEFIBRILLATOR TO THE HOSPITAL-CALL 911.

## 2018-02-21 ENCOUNTER — Telehealth: Payer: Self-pay

## 2018-02-21 LAB — BASIC METABOLIC PANEL WITH GFR
BUN/Creatinine Ratio: 18 (ref 10–24)
BUN: 29 mg/dL — ABNORMAL HIGH (ref 8–27)
CO2: 24 mmol/L (ref 20–29)
Calcium: 9.9 mg/dL (ref 8.6–10.2)
Chloride: 102 mmol/L (ref 96–106)
Creatinine, Ser: 1.62 mg/dL — ABNORMAL HIGH (ref 0.76–1.27)
GFR calc Af Amer: 46 mL/min/1.73 — ABNORMAL LOW
GFR calc non Af Amer: 40 mL/min/1.73 — ABNORMAL LOW
Glucose: 73 mg/dL (ref 65–99)
Potassium: 5.1 mmol/L (ref 3.5–5.2)
Sodium: 142 mmol/L (ref 134–144)

## 2018-02-21 LAB — CBC
Hematocrit: 41.8 % (ref 37.5–51.0)
Hemoglobin: 14.3 g/dL (ref 13.0–17.7)
MCH: 33.6 pg — ABNORMAL HIGH (ref 26.6–33.0)
MCHC: 34.2 g/dL (ref 31.5–35.7)
MCV: 98 fL — ABNORMAL HIGH (ref 79–97)
Platelets: 257 x10E3/uL (ref 150–450)
RBC: 4.26 x10E6/uL (ref 4.14–5.80)
RDW: 11.6 % — ABNORMAL LOW (ref 12.3–15.4)
WBC: 7.6 x10E3/uL (ref 3.4–10.8)

## 2018-02-21 NOTE — Telephone Encounter (Signed)
Received a cardiac report from [facility] that patient was in a flutter with variable conduction. Spoke with Nurse for [employer] that stated no changes to be made, patient was asymptomatic with episodes. Patient scheduled for Pacemaker on 09/05.

## 2018-02-25 ENCOUNTER — Telehealth: Payer: Self-pay | Admitting: *Deleted

## 2018-02-25 DIAGNOSIS — K219 Gastro-esophageal reflux disease without esophagitis: Secondary | ICD-10-CM | POA: Diagnosis not present

## 2018-02-25 DIAGNOSIS — E785 Hyperlipidemia, unspecified: Secondary | ICD-10-CM | POA: Diagnosis not present

## 2018-02-25 DIAGNOSIS — Z6821 Body mass index (BMI) 21.0-21.9, adult: Secondary | ICD-10-CM | POA: Diagnosis not present

## 2018-02-25 DIAGNOSIS — Z23 Encounter for immunization: Secondary | ICD-10-CM | POA: Diagnosis not present

## 2018-02-25 DIAGNOSIS — E1121 Type 2 diabetes mellitus with diabetic nephropathy: Secondary | ICD-10-CM | POA: Diagnosis not present

## 2018-02-25 DIAGNOSIS — I10 Essential (primary) hypertension: Secondary | ICD-10-CM | POA: Diagnosis not present

## 2018-02-25 NOTE — Telephone Encounter (Signed)
Spoke with daughter [name] who returned call. Patient sent back heart monitor yesterday. Patient only felt weak, no other symptoms. No further assistance needed.

## 2018-02-25 NOTE — Telephone Encounter (Signed)
Received notification from [facility]:  Episode of Atrial Flutter HR 160 8/31 at 6:32 AM-auto trigger Episode of atrial flutter with variable conduction/ run of Vtach (5 beats) HR 202/PVCS (6 in 1 min).  9/1 at 8:41 AM, auto trigger  Left message for patient to call back to discuss  Patient is scheduled for PPM implant 9/5b for tachy-brady syndrome.   Routed to Dr. [name] to make aware.

## 2018-02-27 ENCOUNTER — Encounter (HOSPITAL_COMMUNITY)
Admission: RE | Disposition: A | Discharge status: Home / Self Care | Payer: Self-pay | Source: Ambulatory Visit | Attending: Cardiology

## 2018-02-27 ENCOUNTER — Ambulatory Visit (HOSPITAL_COMMUNITY)
Admission: RE | Admit: 2018-02-27 | Discharge: 2018-02-28 | Disposition: A | Discharge status: Home / Self Care | Payer: Medicare HMO | Source: Ambulatory Visit | Attending: Cardiology | Admitting: Cardiology

## 2018-02-27 ENCOUNTER — Encounter (HOSPITAL_COMMUNITY): Payer: Self-pay | Admitting: *Deleted

## 2018-02-27 ENCOUNTER — Other Ambulatory Visit: Payer: Self-pay

## 2018-02-27 DIAGNOSIS — I10 Essential (primary) hypertension: Secondary | ICD-10-CM | POA: Insufficient documentation

## 2018-02-27 DIAGNOSIS — Z85828 Personal history of other malignant neoplasm of skin: Secondary | ICD-10-CM | POA: Diagnosis not present

## 2018-02-27 DIAGNOSIS — Z888 Allergy status to other drugs, medicaments and biological substances status: Secondary | ICD-10-CM | POA: Insufficient documentation

## 2018-02-27 DIAGNOSIS — Z8546 Personal history of malignant neoplasm of prostate: Secondary | ICD-10-CM | POA: Insufficient documentation

## 2018-02-27 DIAGNOSIS — E119 Type 2 diabetes mellitus without complications: Secondary | ICD-10-CM | POA: Insufficient documentation

## 2018-02-27 DIAGNOSIS — Z23 Encounter for immunization: Secondary | ICD-10-CM | POA: Insufficient documentation

## 2018-02-27 DIAGNOSIS — Z86711 Personal history of pulmonary embolism: Secondary | ICD-10-CM | POA: Diagnosis not present

## 2018-02-27 DIAGNOSIS — Z95818 Presence of other cardiac implants and grafts: Secondary | ICD-10-CM

## 2018-02-27 DIAGNOSIS — Z8249 Family history of ischemic heart disease and other diseases of the circulatory system: Secondary | ICD-10-CM | POA: Insufficient documentation

## 2018-02-27 DIAGNOSIS — I480 Paroxysmal atrial fibrillation: Secondary | ICD-10-CM | POA: Insufficient documentation

## 2018-02-27 DIAGNOSIS — I495 Sick sinus syndrome: Secondary | ICD-10-CM | POA: Diagnosis present

## 2018-02-27 DIAGNOSIS — Z9889 Other specified postprocedural states: Secondary | ICD-10-CM | POA: Insufficient documentation

## 2018-02-27 DIAGNOSIS — Z79899 Other long term (current) drug therapy: Secondary | ICD-10-CM | POA: Insufficient documentation

## 2018-02-27 DIAGNOSIS — I700 Atherosclerosis of aorta: Secondary | ICD-10-CM | POA: Diagnosis not present

## 2018-02-27 DIAGNOSIS — Z809 Family history of malignant neoplasm, unspecified: Secondary | ICD-10-CM | POA: Insufficient documentation

## 2018-02-27 DIAGNOSIS — Z7982 Long term (current) use of aspirin: Secondary | ICD-10-CM | POA: Diagnosis not present

## 2018-02-27 DIAGNOSIS — E785 Hyperlipidemia, unspecified: Secondary | ICD-10-CM | POA: Diagnosis not present

## 2018-02-27 DIAGNOSIS — Z7901 Long term (current) use of anticoagulants: Secondary | ICD-10-CM | POA: Insufficient documentation

## 2018-02-27 DIAGNOSIS — Z833 Family history of diabetes mellitus: Secondary | ICD-10-CM | POA: Diagnosis not present

## 2018-02-27 DIAGNOSIS — Z951 Presence of aortocoronary bypass graft: Secondary | ICD-10-CM | POA: Insufficient documentation

## 2018-02-27 DIAGNOSIS — I2510 Atherosclerotic heart disease of native coronary artery without angina pectoris: Secondary | ICD-10-CM | POA: Diagnosis not present

## 2018-02-27 HISTORY — PX: PACEMAKER IMPLANT: EP1218

## 2018-02-27 LAB — CREATININE, SERUM
Creatinine, Ser: 1.57 mg/dL — ABNORMAL HIGH (ref 0.61–1.24)
GFR calc Af Amer: 47 mL/min — ABNORMAL LOW (ref >60)
GFR, EST NON AFRICAN AMERICAN: 41 mL/min — AB (ref >60)

## 2018-02-27 LAB — GLUCOSE, CAPILLARY
GLUCOSE-CAPILLARY: 133 mg/dL — AB (ref 70–99)
Glucose-Capillary: 118 mg/dL — ABNORMAL HIGH (ref 70–99)
Glucose-Capillary: 78 mg/dL (ref 70–99)

## 2018-02-27 LAB — SURGICAL PCR SCREEN
MRSA, PCR: NEGATIVE
Staphylococcus aureus: NEGATIVE

## 2018-02-27 SURGERY — PACEMAKER IMPLANT
Anesthesia: LOCAL

## 2018-02-27 MED ORDER — DILTIAZEM HCL ER COATED BEADS 240 MG PO CP24
240.0000 mg | ORAL_CAPSULE | Freq: Every day | ORAL | Status: DC
Start: 2018-02-27 — End: 2018-02-28
  Administered 2018-02-28: 240 mg via ORAL
  Filled 2018-02-27: qty 1

## 2018-02-27 MED ORDER — APIXABAN 2.5 MG PO TABS
2.5000 mg | ORAL_TABLET | Freq: Two times a day (BID) | ORAL | Status: DC
  Administered 2018-02-27 – 2018-02-28 (×2): 2.5 mg via ORAL
  Filled 2018-02-27 (×2): qty 1

## 2018-02-27 MED ORDER — SODIUM CHLORIDE 0.9 % IV SOLN
80.0000 mg | INTRAVENOUS | Status: AC
  Administered 2018-02-27: 80 mg
  Filled 2018-02-27: qty 2

## 2018-02-27 MED ORDER — CEFAZOLIN SODIUM-DEXTROSE 2-4 GM/100ML-% IV SOLN
2.0000 g | INTRAVENOUS | Status: AC
  Administered 2018-02-27: 2 g via INTRAVENOUS
  Filled 2018-02-27: qty 100

## 2018-02-27 MED ORDER — MUPIROCIN 2 % EX OINT
TOPICAL_OINTMENT | CUTANEOUS | Status: AC
  Filled 2018-02-27: qty 22

## 2018-02-27 MED ORDER — HEPARIN (PORCINE) IN NACL 1000-0.9 UT/500ML-% IV SOLN
INTRAVENOUS | Status: AC
  Filled 2018-02-27: qty 500

## 2018-02-27 MED ORDER — MIDAZOLAM HCL 5 MG/5ML IJ SOLN
INTRAMUSCULAR | Status: AC
  Filled 2018-02-27: qty 5

## 2018-02-27 MED ORDER — SIMVASTATIN 10 MG PO TABS
10.0000 mg | ORAL_TABLET | Freq: Every day | ORAL | Status: DC
  Administered 2018-02-27: 10 mg via ORAL
  Filled 2018-02-27: qty 1

## 2018-02-27 MED ORDER — FENTANYL CITRATE (PF) 100 MCG/2ML IJ SOLN
INTRAMUSCULAR | Status: AC
  Filled 2018-02-27: qty 2

## 2018-02-27 MED ORDER — LINAGLIPTIN 5 MG PO TABS
5.0000 mg | ORAL_TABLET | Freq: Every day | ORAL | Status: DC
  Administered 2018-02-27 – 2018-02-28 (×2): 5 mg via ORAL
  Filled 2018-02-27 (×2): qty 1

## 2018-02-27 MED ORDER — FERROUS SULFATE 325 (65 FE) MG PO TABS
325.0000 mg | ORAL_TABLET | Freq: Every day | ORAL | Status: DC
  Administered 2018-02-28: 325 mg via ORAL
  Filled 2018-02-27 (×2): qty 1

## 2018-02-27 MED ORDER — CHLORHEXIDINE GLUCONATE 4 % EX LIQD
60.0000 mL | CUTANEOUS | Status: DC
  Filled 2018-02-27: qty 60

## 2018-02-27 MED ORDER — ACETAMINOPHEN 325 MG PO TABS: 325.0000 mg | ORAL_TABLET | ORAL | Status: DC | PRN

## 2018-02-27 MED ORDER — CEFAZOLIN SODIUM-DEXTROSE 2-4 GM/100ML-% IV SOLN
INTRAVENOUS | Status: AC
  Filled 2018-02-27: qty 100

## 2018-02-27 MED ORDER — MUPIROCIN 2 % EX OINT
1.0000 "application " | TOPICAL_OINTMENT | Freq: Once | CUTANEOUS | Status: AC
  Administered 2018-02-27: 1 via TOPICAL

## 2018-02-27 MED ORDER — BENAZEPRIL HCL 10 MG PO TABS
40.0000 mg | ORAL_TABLET | Freq: Every day | ORAL | Status: DC
  Administered 2018-02-27 – 2018-02-28 (×2): 40 mg via ORAL
  Filled 2018-02-27 (×2): qty 4

## 2018-02-27 MED ORDER — ONDANSETRON HCL 4 MG/2ML IJ SOLN: 4.0000 mg | Freq: Four times a day (QID) | INTRAMUSCULAR | Status: DC | PRN

## 2018-02-27 MED ORDER — LIDOCAINE HCL (PF) 1 % IJ SOLN
INTRAMUSCULAR | Status: AC
  Filled 2018-02-27: qty 60

## 2018-02-27 MED ORDER — PREMIER PROTEIN SHAKE
2.0000 [oz_av] | Freq: Two times a day (BID) | ORAL | Status: DC
  Filled 2018-02-27 (×3): qty 325.31

## 2018-02-27 MED ORDER — HEPARIN (PORCINE) IN NACL 1000-0.9 UT/500ML-% IV SOLN
INTRAVENOUS | Status: DC | PRN
  Administered 2018-02-27: 500 mL

## 2018-02-27 MED ORDER — ASPIRIN EC 81 MG PO TBEC
81.0000 mg | DELAYED_RELEASE_TABLET | Freq: Every day | ORAL | Status: DC
  Administered 2018-02-27 – 2018-02-28 (×2): 81 mg via ORAL
  Filled 2018-02-27 (×2): qty 1

## 2018-02-27 MED ORDER — GLIMEPIRIDE 4 MG PO TABS
4.0000 mg | ORAL_TABLET | Freq: Every day | ORAL | Status: DC
  Administered 2018-02-27 – 2018-02-28 (×2): 4 mg via ORAL
  Filled 2018-02-27 (×2): qty 1

## 2018-02-27 MED ORDER — SODIUM CHLORIDE 0.9 % IV SOLN
INTRAVENOUS | Status: AC
  Filled 2018-02-27: qty 2

## 2018-02-27 MED ORDER — APIXABAN 5 MG PO TABS
5.0000 mg | ORAL_TABLET | Freq: Two times a day (BID) | ORAL | Status: DC
  Administered 2018-02-27: 5 mg via ORAL
  Filled 2018-02-27: qty 1

## 2018-02-27 MED ORDER — SODIUM CHLORIDE 0.9 % IV SOLN
INTRAVENOUS | Status: DC
  Administered 2018-02-27: 08:00:00 via INTRAVENOUS

## 2018-02-27 MED ORDER — INFLUENZA VAC SPLIT HIGH-DOSE 0.5 ML IM SUSY
0.5000 mL | PREFILLED_SYRINGE | INTRAMUSCULAR | Status: AC
  Administered 2018-02-28: 0.5 mL via INTRAMUSCULAR
  Filled 2018-02-27: qty 0.5

## 2018-02-27 MED ORDER — LIDOCAINE HCL (PF) 1 % IJ SOLN
INTRAMUSCULAR | Status: DC | PRN
  Administered 2018-02-27: 50 mL via SUBCUTANEOUS

## 2018-02-27 MED ORDER — CEFAZOLIN SODIUM-DEXTROSE 1-4 GM/50ML-% IV SOLN
1.0000 g | Freq: Three times a day (TID) | INTRAVENOUS | Status: AC
  Administered 2018-02-27 – 2018-02-28 (×3): 1 g via INTRAVENOUS
  Filled 2018-02-27 (×4): qty 50

## 2018-02-27 SURGICAL SUPPLY — 12 items
CABLE SURGICAL S-101-97-12 (CABLE) ×2 IMPLANT
CATH RIGHTSITE C315HIS02 (CATHETERS) ×2 IMPLANT
IPG PACE AZUR XT DR MRI W1DR01 (Pacemaker) IMPLANT
LEAD CAPSURE NOVUS 5076-58CM (Lead) ×1 IMPLANT
LEAD SELECT SECURE 3830 383069 (Lead) IMPLANT
PACE AZURE XT DR MRI W1DR01 (Pacemaker) ×2 IMPLANT
PAD PRO RADIOLUCENT 2001M-C (PAD) ×2 IMPLANT
SELECT SECURE 3830 383069 (Lead) ×2 IMPLANT
SHEATH CLASSIC 7F (SHEATH) ×2 IMPLANT
SLITTER 6232ADJ (MISCELLANEOUS) ×1 IMPLANT
TRAY PACEMAKER INSERTION (PACKS) ×2 IMPLANT
WIRE HI TORQ VERSACORE-J 145CM (WIRE) ×1 IMPLANT

## 2018-02-27 NOTE — H&P (Signed)
Bryan Carney has presented today for surgery, with the diagnosis of tachy/brady syndrome.  The various methods of treatment have been discussed with the patient and family. After consideration of risks, benefits and other options for treatment, the patient has consented to  Procedure(s): Pacemaker implant as a surgical intervention .  Risks include but not limited to bleeding, tamponade, infection, pneumothorax, among others. The patient's history has been reviewed, patient examined, no change in status, stable for surgery.  I have reviewed the patient's chart and labs.  Questions were answered to the patient's satisfaction.    Bryan Fake [name], MD 02/27/2018 7:27 AM

## 2018-02-27 NOTE — Discharge Instructions (Signed)
° ° °  Supplemental Discharge Instructions for  Pacemaker/Defibrillator Patients  Activity No heavy lifting or vigorous activity with your left/right arm for 6 to 8 weeks.  Do not raise your left/right arm above your head for one week.  Gradually raise your affected arm as drawn below.             03/03/18                       03/04/18                    03/05/18                   03/06/18 __  NO DRIVING for  1 week   ; you may begin driving on   2/33/43  .  WOUND CARE - Keep the wound area clean and dry.  Do not get this area wet, no showers until cleared to at your wound check visit . - The tape/steri-strips on your wound will fall off; do not pull them off.  No bandage is needed on the site.  DO  NOT apply any creams, oils, or ointments to the wound area. - If you notice any drainage or discharge from the wound, any swelling or bruising at the site, or you develop a fever > 101? F after you are discharged home, call the office at once.  Special Instructions - You are still able to use cellular telephones; use the ear opposite the side where you have your pacemaker/defibrillator.  Avoid carrying your cellular phone near your device. - When traveling through airports, show security personnel your identification card to avoid being screened in the metal detectors.  Ask the security personnel to use the hand wand. - Avoid arc welding equipment, MRI testing (magnetic resonance imaging), TENS units (transcutaneous nerve stimulators).  Call the office for questions about other devices. - Avoid electrical appliances that are in poor condition or are not properly grounded. - Microwave ovens are safe to be near or to operate.  Additional information for defibrillator patients should your device go off: - If your device goes off ONCE and you feel fine afterward, notify the device clinic nurses. - If your device goes off ONCE and you do not feel well afterward, call 911. - If your device goes off TWICE,  call 911. - If your device goes off THREE times in one day, call 911.  DO NOT DRIVE YOURSELF OR A FAMILY MEMBER WITH A DEFIBRILLATOR TO THE HOSPITAL--CALL 911.

## 2018-02-28 ENCOUNTER — Ambulatory Visit (HOSPITAL_COMMUNITY): Payer: Medicare HMO

## 2018-02-28 DIAGNOSIS — Z7901 Long term (current) use of anticoagulants: Secondary | ICD-10-CM | POA: Diagnosis not present

## 2018-02-28 DIAGNOSIS — I700 Atherosclerosis of aorta: Secondary | ICD-10-CM | POA: Diagnosis not present

## 2018-02-28 DIAGNOSIS — I495 Sick sinus syndrome: Secondary | ICD-10-CM | POA: Diagnosis not present

## 2018-02-28 DIAGNOSIS — I10 Essential (primary) hypertension: Secondary | ICD-10-CM | POA: Diagnosis not present

## 2018-02-28 DIAGNOSIS — I2510 Atherosclerotic heart disease of native coronary artery without angina pectoris: Secondary | ICD-10-CM | POA: Diagnosis not present

## 2018-02-28 DIAGNOSIS — Z950 Presence of cardiac pacemaker: Secondary | ICD-10-CM | POA: Diagnosis not present

## 2018-02-28 DIAGNOSIS — E785 Hyperlipidemia, unspecified: Secondary | ICD-10-CM | POA: Diagnosis not present

## 2018-02-28 DIAGNOSIS — E119 Type 2 diabetes mellitus without complications: Secondary | ICD-10-CM | POA: Diagnosis not present

## 2018-02-28 DIAGNOSIS — Z23 Encounter for immunization: Secondary | ICD-10-CM | POA: Diagnosis not present

## 2018-02-28 DIAGNOSIS — I480 Paroxysmal atrial fibrillation: Secondary | ICD-10-CM | POA: Diagnosis not present

## 2018-02-28 LAB — GLUCOSE, CAPILLARY: Glucose-Capillary: 143 mg/dL — ABNORMAL HIGH (ref 70–99)

## 2018-02-28 MED ORDER — DILTIAZEM HCL ER COATED BEADS 240 MG PO CP24: 240.0000 mg | ORAL_CAPSULE | Freq: Every day | ORAL | 1 refills | Status: DC

## 2018-02-28 NOTE — Discharge Summary (Addendum)
ELECTROPHYSIOLOGY PROCEDURE DISCHARGE SUMMARY    Patient ID: Bryan Carney,  MRN: [id number], DOB/AGE: [birth year] 77 y.o.  Admit date: 02/27/2018 Discharge date: 02/28/2018  Primary Care Physician: [name], NP Primary Cardiologist: [name] Electrophysiologist: [name]  Primary Discharge Diagnosis:  Tachy/brady syndrome status post pacemaker implantation this admission  Secondary Discharge Diagnosis:  1.  Persistent atrial fibrillation 2.  CAD s/p CABG 3.  Diabetes 4.  Prior PE 5.  Hypertension 6.  Hyperlipidemia  Allergies  Allergen Reactions  . Lopressor [Metoprolol Tartrate] Other (See Comments)    Severe BP drop  . Lipitor [Atorvastatin]     Muscle aches     Procedures This Admission:  1.  Implantation of a MDT dual chamber PPM on 02/27/18 by Dr [name].  See op note for full details. There were no immediate post procedure complications. 2.  CXR on 02/28/18 demonstrated no pneumothorax status post device implantation.   Brief HPI: Bryan Carney is a 77 y.o. male was referred to electrophysiology in the outpatient setting for consideration of PPM implantation.  Past medical history includes persistent atrial fibrillation, intermittent junctional rhythm, CAD, HTN.  The patient has had symptomatic tachy brady syndrome.  Risks, benefits, and alternatives to PPM implantation were reviewed with the patient who wished to proceed.   Hospital Course:  The patient was admitted and underwent implantation of a MDT dual chamber PPM with details as outlined above.  He  was monitored on telemetry overnight which demonstrated atrial flutter.  Left chest was without hematoma or ecchymosis.  The device was interrogated and found to be functioning normally.  CXR was obtained and demonstrated no pneumothorax status post device implantation.  Wound care, arm mobility, and restrictions were reviewed with the patient.  The patient was examined and considered stable for discharge to home.     Physical Exam: Vitals:   02/27/18 1424 02/27/18 1524 02/27/18 2009 02/28/18 0533  BP: 127/73 122/63 100/65 133/89  Pulse:   71 74  Resp:   13 (!) 21  Temp:   98.8 F (37.1 C) 98.1 F (36.7 C)  TempSrc:   Oral Oral  SpO2:   95% 97%  Weight:    57.6 kg  Height:        GEN- The patient is well appearing, alert and oriented x 3 today.   HEENT: normocephalic, atraumatic; sclera clear, conjunctiva pink; hearing intact; oropharynx clear; neck supple  Lungs- Clear to ausculation bilaterally, normal work of breathing.  No wheezes, rales, rhonchi Heart- Irregular rate and rhythm  GI- soft, non-tender, non-distended, bowel sounds present  Extremities- no clubbing, cyanosis, or edema  MS- no significant deformity or atrophy Skin- warm and dry, no rash or lesion, left chest without hematoma/ecchymosis Psych- euthymic mood, full affect Neuro- strength and sensation are intact   Labs:   Lab Results  Component Value Date   WBC 7.6 02/20/2018   HGB 14.3 02/20/2018   HCT 41.8 02/20/2018   MCV 98 (H) 02/20/2018   PLT 257 02/20/2018    Recent Labs  Lab 02/27/18 1415  CREATININE 1.57*    Discharge Medications:  Allergies as of 02/28/2018      Reactions   Lopressor [metoprolol Tartrate] Other (See Comments)   Severe BP drop   Lipitor [atorvastatin]    Muscle aches      Medication List    TAKE these medications   apixaban 5 MG Tabs tablet Commonly known as:  ELIQUIS Take 1 tablet (5 mg total)  by mouth 2 (two) times daily.   aspirin EC 81 MG tablet Take 81 mg by mouth daily.   benazepril 40 MG tablet Commonly known as:  LOTENSIN Take 1 tablet (40 mg total) by mouth daily.   diltiazem 240 MG 24 hr capsule Commonly known as:  CARDIZEM CD Take 1 capsule (240 mg total) by mouth daily. Start taking on:  03/01/2018 What changed:    medication strength  how much to take   ferrous sulfate 325 (65 FE) MG tablet Take 1 tablet (325 mg total) by mouth 2 (two) times daily  with a meal. What changed:    how much to take  when to take this   glimepiride 4 MG tablet Commonly known as:  AMARYL Take 1 tablet (4 mg total) by mouth daily with breakfast.   linagliptin 5 MG Tabs tablet Commonly known as:  TRADJENTA Take 1 tablet (5 mg total) by mouth daily.   protein supplement shake Liqd Commonly known as:  PREMIER PROTEIN Take 59.1 mLs (2 oz total) by mouth 2 (two) times daily between meals.   simvastatin 20 MG tablet Commonly known as:  ZOCOR Take 0.5 tablets (10 mg total) by mouth at bedtime.       Disposition:  Discharge Instructions    Diet - low sodium heart healthy   Complete by:  As directed    Increase activity slowly   Complete by:  As directed      Follow-up Information    [place] [facility] Follow up on 03/12/2018.   Specialty:  Cardiology Why:  3:00PM, wound check visit Contact information: [address], [address] [place]       Constance Haw, MD Follow up on 06/02/2018.   Specialty:  Cardiology Why:  11:15AM Contact information: [place] [address] [phone]           Duration of Discharge Encounter: Greater than 30 minutes including physician time.  Signed, Chanetta Marshall, NP 02/28/2018 9:16 AM  I have seen and examined this patient with Chanetta Marshall.  Agree with above, note added to reflect my findings.  On exam, iRRR, no murmurs, lungs clear.  Patient status post Med[employer]onic dual-chamber pacemaker for tachybradycardia syndrome.  Currently in atrial flutter.  Plan to increase rate control.  Chest x-ray and interrogation without issue.  Plan for follow-up in clinic.  Annella Prowell M. Aireana Ryland MD 02/28/2018 11:16 AM

## 2018-03-03 ENCOUNTER — Telehealth: Payer: Self-pay | Admitting: Cardiology

## 2018-03-03 NOTE — Telephone Encounter (Signed)
Called and spoke to daughter about patient's device site. Daughter states that she noticed some bruising around the device and wasn't sure if it was normal. Daughter denies any edema or drainage from the site. Daughter states patient has not had fever/chills. I assured daughter that bruising after a device implant is normal. I told her that she needed to call back if site becomes larger, tighter, or begins to drain. Daughter verbalized understanding and appreciation of information.

## 2018-03-03 NOTE — Telephone Encounter (Signed)
New Message     1. Has your device fired? no  2. Is you device beeping? no  3. Are you experiencing draining or swelling at device site? no  4. Are you calling to see if we received your device transmission? no  5. Have you passed out? No   Patients daughter is calling because she questions about what they should be looking for with the wound. Please call.     Please route to [place]

## 2018-03-12 ENCOUNTER — Ambulatory Visit (INDEPENDENT_AMBULATORY_CARE_PROVIDER_SITE_OTHER): Payer: Medicare HMO | Admitting: *Deleted

## 2018-03-12 DIAGNOSIS — I495 Sick sinus syndrome: Secondary | ICD-10-CM

## 2018-03-12 LAB — CUP PACEART INCLINIC DEVICE CHECK
Battery Remaining Longevity: 172 mo
Battery Voltage: 3.2 V
Implantable Lead Implant Date: 20190905
Implantable Lead Location: 753859
Implantable Lead Location: 753860
Implantable Lead Model: 5076
Implantable Pulse Generator Implant Date: 20190905
Lead Channel Impedance Value: 285 Ohm
Lead Channel Impedance Value: 304 Ohm
Lead Channel Pacing Threshold Amplitude: 1.5 V
Lead Channel Sensing Intrinsic Amplitude: 0.75 mV
Lead Channel Sensing Intrinsic Amplitude: 6.125 mV
Lead Channel Setting Pacing Amplitude: 3.5 V
Lead Channel Setting Pacing Pulse Width: 0.4 ms
MDC IDC LEAD IMPLANT DT: 20190905
MDC IDC MSMT LEADCHNL RA IMPEDANCE VALUE: 418 Ohm
MDC IDC MSMT LEADCHNL RA SENSING INTR AMPL: 1.25 mV
MDC IDC MSMT LEADCHNL RV IMPEDANCE VALUE: 418 Ohm
MDC IDC MSMT LEADCHNL RV PACING THRESHOLD PULSEWIDTH: 0.4 ms
MDC IDC MSMT LEADCHNL RV SENSING INTR AMPL: 7.375 mV
MDC IDC SESS DTM: 20190918180500
MDC IDC SET LEADCHNL RV PACING AMPLITUDE: 3.5 V
MDC IDC SET LEADCHNL RV SENSING SENSITIVITY: 1.2 mV
MDC IDC STAT BRADY RA PERCENT PACED: 0.03 %
MDC IDC STAT BRADY RV PERCENT PACED: 23.45 %

## 2018-03-12 NOTE — Progress Notes (Signed)
Wound check appointment. Steri-strips removed. Wound without redness or edema. Incision edges approximated, wound well healed. Normal device function. Thresholds, sensing, and impedances consistent with implant measurements. Septal pacing noted from 6V to LOC for HBP lead. Device programmed at 3.5V for extra safety margin until 3 month visit. Histogram distribution appropriate for patient and level of activity. Persistent AFL + Eliquis, patient asymptomatic. No high ventricular rates noted. Patient educated about wound care, arm mobility, lifting restrictions. ROV in 3 months with WC/[place].

## 2018-03-13 ENCOUNTER — Ambulatory Visit (INDEPENDENT_AMBULATORY_CARE_PROVIDER_SITE_OTHER): Payer: Medicare HMO

## 2018-03-13 ENCOUNTER — Other Ambulatory Visit: Payer: Self-pay

## 2018-03-13 DIAGNOSIS — R011 Cardiac murmur, unspecified: Secondary | ICD-10-CM

## 2018-03-13 DIAGNOSIS — I480 Paroxysmal atrial fibrillation: Secondary | ICD-10-CM

## 2018-03-13 DIAGNOSIS — R0989 Other specified symptoms and signs involving the circulatory and respiratory systems: Secondary | ICD-10-CM | POA: Diagnosis not present

## 2018-03-13 DIAGNOSIS — R5383 Other fatigue: Secondary | ICD-10-CM

## 2018-03-13 NOTE — Progress Notes (Signed)
Complete echocardiogram has been performed.  Jimmy Anhar Mcdermott RDCS, RVT 

## 2018-03-13 NOTE — Progress Notes (Addendum)
Complete carotid duplex exam has been performed. Moderate fibrous plaque noted in the bulb/ Proximal ICA and diffuse atherosclerosis was observed bilaterally.  Jimmy Mozella Rexrode RDCS, RVT

## 2018-03-25 DIAGNOSIS — Z6821 Body mass index (BMI) 21.0-21.9, adult: Secondary | ICD-10-CM | POA: Diagnosis not present

## 2018-03-25 DIAGNOSIS — J208 Acute bronchitis due to other specified organisms: Secondary | ICD-10-CM | POA: Diagnosis not present

## 2018-03-26 ENCOUNTER — Ambulatory Visit: Payer: Medicare HMO | Admitting: Cardiovascular Disease

## 2018-03-26 ENCOUNTER — Encounter: Payer: Self-pay | Admitting: Cardiovascular Disease

## 2018-03-26 VITALS — BP 108/62 | HR 84 | Ht 66.0 in | Wt 132.4 lb

## 2018-03-26 DIAGNOSIS — I350 Nonrheumatic aortic (valve) stenosis: Secondary | ICD-10-CM

## 2018-03-26 DIAGNOSIS — I495 Sick sinus syndrome: Secondary | ICD-10-CM | POA: Diagnosis not present

## 2018-03-26 NOTE — Assessment & Plan Note (Signed)
3 weeks status post permanent transvenous pacemaker insertion by Dr. [name] for "tachybradycardia syndrome".  He remains on Eliquis oral anticoagulation.  He feels markedly clinically improved.  His pacer site appears well-healed.

## 2018-03-26 NOTE — Patient Instructions (Signed)
Medication Instructions:   NO CHANGE  Testing/Procedures:  Your physician has requested that you have an echocardiogram. Echocardiography is a painless test that uses sound waves to create images of your heart. It provides your doctor with information about the size and shape of your heart and how well your heart's chambers and valves are working. This procedure takes approximately one hour. There are no restrictions for this procedure.SCHEDULE IN 1 YR    Follow-Up:  Your physician wants you to follow-up in: [facility] COMPLETE You will receive a reminder letter in the mail two months in advance. If you don't receive a letter, please call our office to schedule the follow-up appointment.   If you need a refill on your cardiac medications before your next appointment, please call your pharmacy.

## 2018-03-26 NOTE — Progress Notes (Signed)
03/26/2018 CROCKETT Carney   07/22/1940  [id number]  Primary Physician [name], NP Primary Cardiologist: Lorretta Harp MD [name], [place]  HPI:  Bryan Carney is a 77 y.o.  thin-appearing married Caucasian male father of one daughter, grandfather of 2 grandchildren who is accompanied by hisdaughter [name] as well as his wife..  I last saw him in the office 01/28/2018.. he was admitted to the hospital in October 2018 with chest wall hematoma and acute blood loss anemia. He did have aspiration pneumonia, PAF and altered mental status. He was in the hospital for 18 days but has since recuperated. His Coumadin was changed to Eliquis. He is currently asymptomatic although he has some back pain walks with a cane apparently needs a back injection by Dr. Herma Mering. 05/09/2016. He worked at a [employer] in [place]. He has a history of CAD and PAD. He had coronary artery bypass grafting April 1999 and left carotid endarterectomy by Dr. Kellie Simmering 02/23/95. His problems include treated hypertension and hyperlipidemia. He did have a remote pulmonary embolus on Coumadin anticoagulation. His last Myoview stress test performed 06/12/12 was normal. Since I saw him 2 years ago he has done well until last October he was hospitalized for chest wall hematoma, acute blood loss anemia complications that included aspiration pneumonia, altered mental status and PAF. His Coumadin was transitioned to Eliquis. Since that time he has been relatively asymptomatic. He does have back pain and recently had a back injection by Dr. Nelva Bush.   Since I saw him 2 months ago he has had a permanent transvenous pacemaker placed by Dr. [name] at my request for tachybradycardia syndrome which he was symptomatic from.  He is markedly clinically improved.  He remains on oral anticoagulation.  His pacer site is well-healed.   Current Meds  Medication Sig  . apixaban (ELIQUIS) 5 MG TABS tablet Take 1 tablet (5 mg  total) by mouth 2 (two) times daily.  Marland Kitchen aspirin EC 81 MG tablet Take 81 mg by mouth daily.  . benazepril (LOTENSIN) 40 MG tablet Take 1 tablet (40 mg total) by mouth daily.  Marland Kitchen diltiazem (CARDIZEM CD) 240 MG 24 hr capsule Take 1 capsule (240 mg total) by mouth daily.  . ferrous sulfate 325 (65 FE) MG tablet Take 1 tablet (325 mg total) by mouth 2 (two) times daily with a meal. (Patient taking differently: Take 162.5 mg by mouth daily with breakfast. )  . glimepiride (AMARYL) 4 MG tablet Take 1 tablet (4 mg total) by mouth daily with breakfast.  . levofloxacin (LEVAQUIN) 750 MG tablet   . linagliptin (TRADJENTA) 5 MG TABS tablet Take 1 tablet (5 mg total) by mouth daily.  . protein supplement shake (PREMIER PROTEIN) LIQD Take 59.1 mLs (2 oz total) by mouth 2 (two) times daily between meals.  . simvastatin (ZOCOR) 20 MG tablet Take 0.5 tablets (10 mg total) by mouth at bedtime.     Allergies  Allergen Reactions  . Lopressor [Metoprolol Tartrate] Other (See Comments)    Severe BP drop  . Lipitor [Atorvastatin]     Muscle aches    Social History   Socioeconomic History  . Marital status: Married    Spouse name: Not on file  . Number of children: Not on file  . Years of education: Not on file  . Highest education level: Not on file  Occupational History  . Not on file  Social Needs  . Financial resource strain: Not  on file  . Food insecurity:    Worry: Not on file    Inability: Not on file  . Transportation needs:    Medical: Not on file    Non-medical: Not on file  Tobacco Use  . Smoking status: Never Smoker  . Smokeless tobacco: Never Used  Substance and Sexual Activity  . Alcohol use: No  . Drug use: Not on file  . Sexual activity: Not on file  Lifestyle  . Physical activity:    Days per week: Not on file    Minutes per session: Not on file  . Stress: Not on file  Relationships  . Social connections:    Talks on phone: Not on file    Gets together: Not on file     Attends religious service: Not on file    Active member of club or organization: Not on file    Attends meetings of clubs or organizations: Not on file    Relationship status: Not on file  . Intimate partner violence:    Fear of current or ex partner: Not on file    Emotionally abused: Not on file    Physically abused: Not on file    Forced sexual activity: Not on file  Other Topics Concern  . Not on file  Social History Narrative  . Not on file     Review of Systems: General: negative for chills, fever, night sweats or weight changes.  Cardiovascular: negative for chest pain, dyspnea on exertion, edema, orthopnea, palpitations, paroxysmal nocturnal dyspnea or shortness of breath Dermatological: negative for rash Respiratory: negative for cough or wheezing Urologic: negative for hematuria Abdominal: negative for nausea, vomiting, diarrhea, bright red blood per rectum, melena, or hematemesis Neurologic: negative for visual changes, syncope, or dizziness All other systems reviewed and are otherwise negative except as noted above.    Blood pressure 108/62, pulse 84, height 5\' 6"  (1.676 m), weight 132 lb 6.4 oz (60.1 kg).  General appearance: alert and no distress Neck: no adenopathy, no JVD, supple, symmetrical, trachea midline, thyroid not enlarged, symmetric, no tenderness/mass/nodules and Soft bilateral carotid bruits Lungs: clear to auscultation bilaterally Heart: regular rate and rhythm, S1, S2 normal, no murmur, click, rub or gallop Extremities: extremities normal, atraumatic, no cyanosis or edema Pulses: 2+ and symmetric Skin: Skin color, texture, turgor normal. No rashes or lesions Neurologic: Alert and oriented X 3, normal strength and tone. Normal symmetric reflexes. Normal coordination and gait  EKG not performed today  ASSESSMENT AND PLAN:   Tachy-brady syndrome ([place]) 3 weeks status post permanent transvenous pacemaker insertion by Dr. [name] for "tachybradycardia  syndrome".  He remains on Eliquis oral anticoagulation.  He feels markedly clinically improved.  His pacer site appears well-healed.      Lorretta Harp MD FACP,FACC,FAHA, [facility] 03/26/2018 4:28 PM

## 2018-04-07 DIAGNOSIS — E119 Type 2 diabetes mellitus without complications: Secondary | ICD-10-CM | POA: Diagnosis not present

## 2018-05-19 DIAGNOSIS — Z6822 Body mass index (BMI) 22.0-22.9, adult: Secondary | ICD-10-CM | POA: Diagnosis not present

## 2018-05-19 DIAGNOSIS — R05 Cough: Secondary | ICD-10-CM | POA: Diagnosis not present

## 2018-05-23 DIAGNOSIS — R05 Cough: Secondary | ICD-10-CM | POA: Diagnosis not present

## 2018-05-29 DIAGNOSIS — E785 Hyperlipidemia, unspecified: Secondary | ICD-10-CM | POA: Diagnosis not present

## 2018-05-29 DIAGNOSIS — I480 Paroxysmal atrial fibrillation: Secondary | ICD-10-CM | POA: Diagnosis not present

## 2018-05-29 DIAGNOSIS — E1121 Type 2 diabetes mellitus with diabetic nephropathy: Secondary | ICD-10-CM | POA: Diagnosis not present

## 2018-05-29 DIAGNOSIS — Z6822 Body mass index (BMI) 22.0-22.9, adult: Secondary | ICD-10-CM | POA: Diagnosis not present

## 2018-05-29 DIAGNOSIS — I10 Essential (primary) hypertension: Secondary | ICD-10-CM | POA: Diagnosis not present

## 2018-05-29 DIAGNOSIS — D539 Nutritional anemia, unspecified: Secondary | ICD-10-CM | POA: Diagnosis not present

## 2018-05-29 DIAGNOSIS — K219 Gastro-esophageal reflux disease without esophagitis: Secondary | ICD-10-CM | POA: Diagnosis not present

## 2018-05-29 DIAGNOSIS — R05 Cough: Secondary | ICD-10-CM | POA: Diagnosis not present

## 2018-05-29 DIAGNOSIS — N189 Chronic kidney disease, unspecified: Secondary | ICD-10-CM | POA: Diagnosis not present

## 2018-06-02 ENCOUNTER — Encounter: Payer: Self-pay | Admitting: Cardiology

## 2018-06-02 ENCOUNTER — Ambulatory Visit (INDEPENDENT_AMBULATORY_CARE_PROVIDER_SITE_OTHER): Payer: Medicare HMO | Admitting: Cardiology

## 2018-06-02 VITALS — BP 124/62 | HR 64 | Ht 66.0 in | Wt 135.0 lb

## 2018-06-02 DIAGNOSIS — I25810 Atherosclerosis of coronary artery bypass graft(s) without angina pectoris: Secondary | ICD-10-CM | POA: Diagnosis not present

## 2018-06-02 DIAGNOSIS — I484 Atypical atrial flutter: Secondary | ICD-10-CM | POA: Diagnosis not present

## 2018-06-02 DIAGNOSIS — I480 Paroxysmal atrial fibrillation: Secondary | ICD-10-CM

## 2018-06-02 DIAGNOSIS — I495 Sick sinus syndrome: Secondary | ICD-10-CM | POA: Diagnosis not present

## 2018-06-02 NOTE — Patient Instructions (Addendum)
Medication Instructions:  Your physician recommends that you continue on your current medications as directed. Please refer to the Current Medication list given to you today.  *If you need a refill on your cardiac medications before your next appointment, please call your pharmacy*  Labwork: None ordered  Testing/Procedures: None ordered  Follow-Up: Remote monitoring is used to monitor your Pacemaker or ICD from home. This monitoring reduces the number of office visits required to check your device to one time per year. It allows [name] to keep an eye on the functioning of your device to ensure it is working properly. You are scheduled for a device check from home on 09/01/2018. You may send your transmission at any time that day. If you have a wireless device, the transmission will be sent automatically. After your physician reviews your transmission, you will receive a postcard with your next transmission date.  Your physician wants you to follow-up in: 6 months with Dr. [name].  You will receive a reminder letter in the mail two months in advance. If you don't receive a letter, please call our office to schedule the follow-up appointment.  Thank you for choosing [facility]!!   Trinidad Curet, RN [phone]

## 2018-06-02 NOTE — Progress Notes (Signed)
Electrophysiology Office Note   Date:  06/02/2018   ID:  Bryan, Carney 16-May-1941, MRN [id number]  PCP:  [name], NP  Cardiologist:  [name] Primary Electrophysiologist:  Will Meredith Leeds, MD    No chief complaint on file.    History of Present Illness: Bryan Carney is a 77 y.o. male who is being seen today for the evaluation of tachy/brady syndrome at the request of [name]. Presenting today for electrophysiology evaluation.  He has a history of paroxysmal atrial fibrillation, coronary artery disease, diabetes, pulmonary embolism, hypertension, hyperlipidemia, pulmonary venoocclusive disease.  He was admitted to the hospital 12/17/2017 with a chest wall hematoma and acute blood loss anemia.  He was found to have aspiration pneumonia, paroxysmal atrial fibrillation and altered mental status.  His Coumadin at the time was changed to Eliquis.  He had a CABG in 1999 and left carotid endarterectomy in 1996.  Presents today with symptomatic atrial fibrillation.  He is in and out of atrial fibrillation quite a bit.  He feels well today, but when he is in atrial fibrillation he has weakness, fatigue, and shortness of breath.  He has no palpitations.  He wore a cardiac monitor that showed atrial fibrillation and atrial flutter as well as up to a 4.3-second pause as well as significant junctional bradycardia into the 30s during waking hours.  He also had some nonsustained VT. he is now status post Medtronic dual-chamber pacemaker implanted 02/27/2018.  Today, denies symptoms of palpitations, chest pain, shortness of breath, orthopnea, PND, lower extremity edema, claudication, dizziness, presyncope, syncope, bleeding, or neurologic sequela. The patient is tolerating medications without difficulties.  Overall he is feeling much improved since his device was implanted.  He was in atrial flutter during the implant and remains in atrial flutter today.  He has no chest pain or shortness of  breath.  He is exercising without complaint.   Past Medical History:  Diagnosis Date  . CAD (coronary artery disease)   . Diabetes ([place])   . Hx pulmonary embolism   . Hyperlipidemia   . Hypertension   . Prostate cancer ([place])   . PVOD (pulmonary veno-occlusive disease) ([place])   . Squamous cell carcinoma    Past Surgical History:  Procedure Laterality Date  . BACK SURGERY    . CARDIAC CATHETERIZATION  10/15/1997   Recommend complete revascularization by CABG  . CARDIOVASCULAR STRESS TEST  06/12/2012   No evidence of ischemia  . CAROTID DOPPLER  08/27/2012   Rt bulb/proximal ICA demonstrated a mild-moderate amount of fibrous plaque without evidence of a significant diameter reduction or any other vascular abnormality.  . CAROTID ENDARTERECTOMY Left 02/23/1995  . CORONARY ARTERY BYPASS GRAFT  10/19/1997   x5. LIMA-LAD, SVG-PDA, SVG-OM1, seq SVG-fist and second branches of ramus intermedius.  Marland Kitchen PACEMAKER IMPLANT N/A 02/27/2018   Procedure: PACEMAKER IMPLANT;  Surgeon: Constance Haw, MD;  Location: [facility];  Service: Cardiovascular;  Laterality: N/A;     Current Outpatient Medications  Medication Sig Dispense Refill  . apixaban (ELIQUIS) 5 MG TABS tablet Take 1 tablet (5 mg total) by mouth 2 (two) times daily. 60 tablet 0  . aspirin EC 81 MG tablet Take 81 mg by mouth daily.    . benazepril (LOTENSIN) 40 MG tablet Take 1 tablet (40 mg total) by mouth daily. 30 tablet 0  . diltiazem (CARDIZEM CD) 240 MG 24 hr capsule Take 1 capsule (240 mg total) by mouth daily. 90 capsule  1  . ferrous sulfate 325 (65 FE) MG tablet Take 325 mg by mouth daily with breakfast.    . glimepiride (AMARYL) 4 MG tablet Take 1 tablet (4 mg total) by mouth daily with breakfast. 30 tablet 0  . linagliptin (TRADJENTA) 5 MG TABS tablet Take 1 tablet (5 mg total) by mouth daily. 30 tablet 0  . protein supplement shake (PREMIER PROTEIN) LIQD Take 59.1 mLs (2 oz total) by mouth 2 (two) times daily between  meals.  0  . simvastatin (ZOCOR) 20 MG tablet Take 0.5 tablets (10 mg total) by mouth at bedtime. 30 tablet 0   No current facility-administered medications for this visit.     Allergies:   Lopressor [metoprolol tartrate] and Lipitor [atorvastatin]   Social History:  The patient  reports that he has never smoked. He has never used smokeless tobacco. He reports that he does not drink alcohol.   Family History:  The patient's family history includes Cancer in his sister and sister; Diabetes in his brother, brother, brother, and brother; Heart attack in his father.    ROS:  Please see the history of present illness.   Otherwise, review of systems is positive for none.   All other systems are reviewed and negative.   PHYSICAL EXAM: VS:  BP 124/62   Pulse 64   Ht 5\' 6"  (1.676 m)   Wt 135 lb (61.2 kg)   BMI 21.79 kg/m  , BMI Body mass index is 21.79 kg/m. GEN: Well nourished, well developed, in no acute distress  HEENT: normal  Neck: no JVD, carotid bruits, or masses Cardiac: RRR; no murmurs, rubs, or gallops,no edema  Respiratory:  clear to auscultation bilaterally, normal work of breathing GI: soft, nontender, nondistended, + BS MS: no deformity or atrophy  Skin: warm and dry, device site well healed Neuro:  Strength and sensation are intact Psych: euthymic mood, full affect  EKG:  EKG is ordered today. Personal review of the ekg ordered shows atrial flutter, V pacing, rate 67  Personal review of the device interrogation today. Results in [place]: 02/20/2018: BUN 29; Hemoglobin 14.3; Platelets 257; Potassium 5.1; Sodium 142 02/27/2018: Creatinine, Ser 1.57    Lipid Panel  No results found for: CHOL, TRIG, HDL, CHOLHDL, VLDL, LDLCALC, LDLDIRECT   Wt Readings from Last 3 Encounters:  06/02/18 135 lb (61.2 kg)  03/26/18 132 lb 6.4 oz (60.1 kg)  02/28/18 126 lb 14.4 oz (57.6 kg)      Other studies Reviewed: Additional studies/ records that were reviewed  today include: TTE 03/24/17  Review of the above records today demonstrates:  - Left ventricle: The cavity size was normal. Wall thickness was   increased in a pattern of moderate LVH. Systolic function was   normal. The estimated ejection fraction was in the range of 50%   to 55%. Wall motion was normal; there were no regional wall   motion abnormalities. - Aortic valve: Trileaflet; mildly thickened, mildly calcified   leaflets. Valve mobility was moderately restricted. Transvalvular   velocity was increased. There was mild to moderate stenosis.   Valve area (VTI): 1.52 cm^2. Valve area (Vmax): 1.56 cm^2. Valve   area (Vmean): 1.4 cm^2. - Aortic root: The aortic root was mildly dilated. - Mitral valve: Mildly to moderately calcified annulus. There was   mild to moderate regurgitation. - Left atrium: The atrium was moderately dilated.  30-day monitor personally reviewed atrial fibrillation and atrial flutter with significant pauses as well  as significant junctional bradycardia.  ASSESSMENT AND PLAN:  1.  Paroxysmal atrial fibrillation/atrial flutter: Feeling well on Eliquis.  He has had no further episodes of tachycardia.  He has been in atrial flutter since his device was implanted.  Since he is not having symptoms, we will plan for a rate control strategy.     This patients CHA2DS2-VASc Score and unadjusted Ischemic Stroke Rate (% per year) is equal to 7.2 % stroke rate/year from a score of 5  Above score calculated as 1 point each if present [CHF, HTN, DM, Vascular=MI/PAD/Aortic Plaque, Age if 65-74, or Male] Above score calculated as 2 points each if present [Age > 75, or Stroke/TIA/TE]   2.  Coronary artery disease: CABG in 1999.  No current chest pain.  3.  Mild aortic stenosis: Found on echo in 2018.  No current symptoms.  4.  Tachybradycardia syndrome: Status post Medtronic dual-chamber pacemaker implanted 02/27/2018.  Vice functioning appropriately.  No changes.  Current  medicines are reviewed at length with the patient today.   The patient does not have concerns regarding his medicines.  The following changes were made today: None  Labs/ tests ordered today include:  Orders Placed This Encounter  Procedures  . EKG 12-Lead    Disposition:   FU with Will Camnitz 6 months  Signed, Will Meredith Leeds, MD  06/02/2018 11:44 AM     [facility] [address] [place] [address] [phone] (office) [phone] (fax)

## 2018-07-03 ENCOUNTER — Other Ambulatory Visit: Payer: Medicare HMO

## 2018-07-14 DIAGNOSIS — E119 Type 2 diabetes mellitus without complications: Secondary | ICD-10-CM | POA: Diagnosis not present

## 2018-08-18 ENCOUNTER — Telehealth: Payer: Self-pay | Admitting: Cardiology

## 2018-08-18 NOTE — Telephone Encounter (Signed)
Patient had an episode over the weekend and daughter sent a transmission but patient got very weak and white and did not feel good and would like to go ahead and see Dr [name] regarding another Cardioversion sincehe is not going out of afib.Marland Kitchen

## 2018-08-18 NOTE — Telephone Encounter (Signed)
reviewed transmission which is showing pt mostly in AFib, over 80% of the time. Dr. [name] recommends OV to discuss antiarrythmic. Scheduled pt to see [name] tomorrow in [place] office.  Dtr is agreeable to plan.

## 2018-08-19 ENCOUNTER — Ambulatory Visit: Payer: Medicare HMO | Admitting: Cardiology

## 2018-08-19 ENCOUNTER — Encounter: Payer: Self-pay | Admitting: Cardiology

## 2018-08-19 VITALS — BP 122/62 | HR 67 | Ht 66.0 in | Wt 133.0 lb

## 2018-08-19 DIAGNOSIS — I495 Sick sinus syndrome: Secondary | ICD-10-CM | POA: Diagnosis not present

## 2018-08-19 DIAGNOSIS — E119 Type 2 diabetes mellitus without complications: Secondary | ICD-10-CM | POA: Diagnosis not present

## 2018-08-19 MED ORDER — AMIODARONE HCL 200 MG PO TABS: 200.0000 mg | ORAL_TABLET | Freq: Every day | ORAL | 1 refills | Status: DC

## 2018-08-19 MED ORDER — AMIODARONE HCL 200 MG PO TABS: ORAL_TABLET | ORAL | 0 refills | Status: DC

## 2018-08-19 NOTE — Patient Instructions (Addendum)
Medication Instructions:  Your physician has recommended you make the following change in your medication:  1. START Amiodarone  - take 2 tablets (400 mg total) TWICE daily for 2 weeks, then  - take 1 tablet (200 mg total) TWICE daily for 2 weeks, then  - take 1 tablet (200 mg total) ONCE daily  * If you need a refill on your cardiac medications before your next appointment, please call your pharmacy.   Labwork: None ordered  Testing/Procedures: Your physician has recommended that you have a Cardioversion (DCCV). Electrical Cardioversion uses a jolt of electricity to your heart either through paddles or wired patches attached to your chest. This is a controlled, usually prescheduled, procedure. Defibrillation is done under light anesthesia in the hospital, and you usually go home the day of the procedure. This is done to get your heart back into a normal rhythm. You are not awake for the procedure.   Cardioversion Instructions You are scheduled for a cardioversion on 09/03/2018 with Dr._________ or associates. Please go to [facility] at ________ Enter through the [place] not have any food or drink after midnight the night before this procedure.  You may take your medicines with a sip of water on the day of your procedure.  You will need someone to drive you home following your procedure.   Call the [place] office at [phone] if you have any questions, problems or concerns.   Follow-Up: Your physician recommends that you schedule a follow-up appointment in: 3 months with Dr. [name].   Thank you for choosing [facility]!!   Trinidad Curet, RN [phone]  Any Other Special Instructions Will Be Listed Below (If Applicable).  Electrical Cardioversion Electrical cardioversion is the delivery of a jolt of electricity to change the rhythm of the heart. Sticky patches or metal paddles are placed on the chest to deliver the  electricity from a device. This is done to restore a normal rhythm. A rhythm that is too fast or not regular keeps the heart from pumping well. Electrical cardioversion is done in an emergency if:   There is low or no blood pressure as a result of the heart rhythm.    Normal rhythm must be restored as fast as possible to protect the brain and heart from further damage.    It may save a life. Cardioversion may be done for heart rhythms that are not immediately life threatening, such as atrial fibrillation or flutter, in which:   The heart is beating too fast or is not regular.    Medicine to change the rhythm has not worked.    It is safe to wait in order to allow time for preparation.  Symptoms of the abnormal rhythm are bothersome.  The risk of stroke and other serious problems can be reduced.  LET [facility] CARE PROVIDER KNOW ABOUT:   Any allergies you have.  All medicines you are taking, including vitamins, herbs, eye drops, creams, and over-the-counter medicines.  Previous problems you or members of your family have had with the use of anesthetics.    Any blood disorders you have.    Previous surgeries you have had.    Medical conditions you have.   RISKS AND COMPLICATIONS  Generally, this is a safe procedure. However, problems can occur and include:   Breathing problems related to the anesthetic used.  A blood clot that breaks free and travels to other parts of your body. This  could cause a stroke or other problems. The risk of this is lowered by use of blood-thinning medicine (anticoagulant) prior to the procedure.  Cardiac arrest (rare).   BEFORE THE PROCEDURE   You may have tests to detect blood clots in your heart and to evaluate heart function.   You may start taking anticoagulants so your blood does not clot as easily.    Medicines may be given to help stabilize your heart rate and rhythm.   PROCEDURE  You will be given medicine through an IV tube  to reduce discomfort and make you sleepy (sedative).    An electrical shock will be delivered.   AFTER THE PROCEDURE Your heart rhythm will be watched to make sure it does not change. You will need someone to drive you home.

## 2018-08-19 NOTE — Progress Notes (Signed)
Electrophysiology Office Note   Date:  08/19/2018   ID:  Bryan Carney, Bryan Carney 05/01/1941, MRN [id number]  PCP:  Bryan Dandy, NP  Cardiologist:  [name] Primary Electrophysiologist:  Ples Trudel Meredith Leeds, MD    No chief complaint on file.    History of Present Illness: Bryan Carney is a 78 y.o. male who is being seen today for the evaluation of tachy/brady syndrome at the request of [name]. Presenting today for electrophysiology evaluation.  He has a history of paroxysmal atrial fibrillation, coronary artery disease, diabetes, pulmonary embolism, hypertension, hyperlipidemia, pulmonary venoocclusive disease.  He was admitted to the hospital 12/17/2017 with a chest wall hematoma and acute blood loss anemia.  He was found to have aspiration pneumonia, paroxysmal atrial fibrillation and altered mental status.  His Coumadin at the time was changed to Eliquis.  He had a CABG in 1999 and left carotid endarterectomy in 1996.  Presents today with symptomatic atrial fibrillation.  He is in and out of atrial fibrillation quite a bit.  He feels well today, but when he is in atrial fibrillation he has weakness, fatigue, and shortness of breath.  He has no palpitations.  He wore a cardiac monitor that showed atrial fibrillation and atrial flutter as well as up to a 4.3-second pause as well as significant junctional bradycardia into the 30s during waking hours.  He also had some nonsustained VT. he is now status post Medtronic dual-chamber pacemaker implanted 02/27/2018.  Today, denies symptoms of palpitations, chest pain, shortness of breath, orthopnea, PND, lower extremity edema, claudication, dizziness, presyncope, syncope, bleeding, or neurologic sequela. The patient is tolerating medications without difficulties.  Overall he is feeling well.  He does continue to have episodes of weakness and fatigue.  His most recent episode was Saturday when he was folding laundry.  He got hot and clammy.  His family  felt that it was due to his atrial fibrillation.  He went and laid down and then felt better after a few hours.   Past Medical History:  Diagnosis Date  . CAD (coronary artery disease)   . Diabetes ([place])   . Hx pulmonary embolism   . Hyperlipidemia   . Hypertension   . Prostate cancer ([place])   . PVOD (pulmonary veno-occlusive disease) ([place])   . Squamous cell carcinoma    Past Surgical History:  Procedure Laterality Date  . BACK SURGERY    . CARDIAC CATHETERIZATION  10/15/1997   Recommend complete revascularization by CABG  . CARDIOVASCULAR STRESS TEST  06/12/2012   No evidence of ischemia  . CAROTID DOPPLER  08/27/2012   Rt bulb/proximal ICA demonstrated a mild-moderate amount of fibrous plaque without evidence of a significant diameter reduction or any other vascular abnormality.  . CAROTID ENDARTERECTOMY Left 02/23/1995  . CORONARY ARTERY BYPASS GRAFT  10/19/1997   x5. LIMA-LAD, SVG-PDA, SVG-OM1, seq SVG-fist and second branches of ramus intermedius.  Marland Kitchen PACEMAKER IMPLANT N/A 02/27/2018   Procedure: PACEMAKER IMPLANT;  Surgeon: Constance Haw, MD;  Location: [facility];  Service: Cardiovascular;  Laterality: N/A;     Current Outpatient Medications  Medication Sig Dispense Refill  . apixaban (ELIQUIS) 5 MG TABS tablet Take 1 tablet (5 mg total) by mouth 2 (two) times daily. 60 tablet 0  . aspirin EC 81 MG tablet Take 81 mg by mouth daily.    . benazepril (LOTENSIN) 40 MG tablet Take 1 tablet (40 mg total) by mouth daily. 30 tablet 0  . diltiazem (  CARDIZEM CD) 240 MG 24 hr capsule Take 1 capsule (240 mg total) by mouth daily. 90 capsule 1  . ferrous sulfate 325 (65 FE) MG tablet Take 325 mg by mouth daily with breakfast.    . glimepiride (AMARYL) 4 MG tablet Take 1 tablet (4 mg total) by mouth daily with breakfast. 30 tablet 0  . linagliptin (TRADJENTA) 5 MG TABS tablet Take 1 tablet (5 mg total) by mouth daily. 30 tablet 0  . protein supplement shake (PREMIER PROTEIN)  LIQD Take 59.1 mLs (2 oz total) by mouth 2 (two) times daily between meals.  0  . simvastatin (ZOCOR) 20 MG tablet Take 0.5 tablets (10 mg total) by mouth at bedtime. 30 tablet 0  . amiodarone (PACERONE) 200 MG tablet Take 2 tablets (400 mg total) twice a day for 2 weeks, then decrease to take 1 tablet (200 mg total) twice a day for 2 weeks, then decrease to 1 tablet once daily 84 tablet 0  . amiodarone (PACERONE) 200 MG tablet Take 1 tablet (200 mg total) by mouth daily. 90 tablet 1   No current facility-administered medications for this visit.     Allergies:   Lopressor [metoprolol tartrate] and Lipitor [atorvastatin]   Social History:  The patient  reports that he has never smoked. He has never used smokeless tobacco. He reports that he does not drink alcohol.   Family History:  The patient's family history includes Cancer in his sister and sister; Diabetes in his brother, brother, brother, and brother; Heart attack in his father.    ROS:  Please see the history of present illness.   Otherwise, review of systems is positive for none.   All other systems are reviewed and negative.   PHYSICAL EXAM: VS:  BP 122/62   Pulse 67   Ht 5\' 6"  (1.676 m)   Wt 133 lb (60.3 kg)   BMI 21.47 kg/m  , BMI Body mass index is 21.47 kg/m. GEN: Well nourished, well developed, in no acute distress  HEENT: normal  Neck: no JVD, carotid bruits, or masses Cardiac: iRRR; no murmurs, rubs, or gallops,no edema  Respiratory:  clear to auscultation bilaterally, normal work of breathing GI: soft, nontender, nondistended, + BS MS: no deformity or atrophy  Skin: warm and dry, device site well healed Neuro:  Strength and sensation are intact Psych: euthymic mood, full affect  EKG:  EKG is ordered today. Personal review of the ekg ordered shows atrial fibrillation, ventricular paced  Personal review of the device interrogation today. Results in [place]: 02/20/2018: BUN 29; Hemoglobin 14.3;  Platelets 257; Potassium 5.1; Sodium 142 02/27/2018: Creatinine, Ser 1.57    Lipid Panel  No results found for: CHOL, TRIG, HDL, CHOLHDL, VLDL, LDLCALC, LDLDIRECT   Wt Readings from Last 3 Encounters:  08/19/18 133 lb (60.3 kg)  06/02/18 135 lb (61.2 kg)  03/26/18 132 lb 6.4 oz (60.1 kg)      Other studies Reviewed: Additional studies/ records that were reviewed today include: TTE 03/24/17  Review of the above records today demonstrates:  - Left ventricle: The cavity size was normal. Wall thickness was   increased in a pattern of moderate LVH. Systolic function was   normal. The estimated ejection fraction was in the range of 50%   to 55%. Wall motion was normal; there were no regional wall   motion abnormalities. - Aortic valve: Trileaflet; mildly thickened, mildly calcified   leaflets. Valve mobility was moderately restricted. Transvalvular  velocity was increased. There was mild to moderate stenosis.   Valve area (VTI): 1.52 cm^2. Valve area (Vmax): 1.56 cm^2. Valve   area (Vmean): 1.4 cm^2. - Aortic root: The aortic root was mildly dilated. - Mitral valve: Mildly to moderately calcified annulus. There was   mild to moderate regurgitation. - Left atrium: The atrium was moderately dilated.  30-day monitor personally reviewed atrial fibrillation and atrial flutter with significant pauses as well as significant junctional bradycardia.  ASSESSMENT AND PLAN:  1.  Persistent atrial fibrillation/atrial flutter: To have episodes of weakness and fatigue.  Due to that, we Abigayl Hor give him one attempt at sinus rhythm.  He chose amiodarone as his antiarrhythmic.  We Braya Habermehl start him on that today.  We Reonna Finlayson plan for cardioversion 2 weeks after initiation of amiodarone.  This patients CHA2DS2-VASc Score and unadjusted Ischemic Stroke Rate (% per year) is equal to 7.2 % stroke rate/year from a score of 5  Above score calculated as 1 point each if present [CHF, HTN, DM, Vascular=MI/PAD/Aortic  Plaque, Age if 65-74, or Male] Above score calculated as 2 points each if present [Age > 75, or Stroke/TIA/TE]   2.  Coronary artery disease: CABG 1999.  No current chest pain  3.  Mild aortic stenosis: Found on echo in 2018.  Asymptomatic.  4.  Tachybradycardia syndrome: Status post Medtronic dual-chamber pacemaker implanted 02/27/2018.  Device functioning appropriately.  No changes.  Current medicines are reviewed at length with the patient today.   The patient does not have concerns regarding his medicines.  The following changes were made today: none  Labs/ tests ordered today include:  Orders Placed This Encounter  Procedures  . EKG 12-Lead    Disposition:   FU with Keene Gilkey 3 months  Signed, Shavontae Gibeault Meredith Leeds, MD  08/19/2018 4:18 PM     [place] [address] [phone] (office) [phone] (fax)

## 2018-08-20 LAB — CUP PACEART INCLINIC DEVICE CHECK
Date Time Interrogation Session: 20200226080547
Implantable Lead Implant Date: 20190905
Implantable Lead Implant Date: 20190905
Implantable Lead Location: 753859
Implantable Lead Location: 753860
Implantable Lead Model: 3830
Implantable Pulse Generator Implant Date: 20190905

## 2018-08-21 LAB — CUP PACEART INCLINIC DEVICE CHECK
Battery Remaining Longevity: 153 mo
Brady Statistic AP VP Percent: 38.63 %
Brady Statistic AP VS Percent: 0.28 %
Brady Statistic AS VP Percent: 54.83 %
Brady Statistic AS VS Percent: 6.38 %
Brady Statistic RA Percent Paced: 4.61 %
Brady Statistic RV Percent Paced: 37.51 %
Date Time Interrogation Session: 20191209161526
Implantable Lead Implant Date: 20190905
Implantable Lead Implant Date: 20190905
Implantable Lead Location: 753859
Implantable Lead Model: 3830
Implantable Lead Model: 5076
Lead Channel Impedance Value: 266 Ohm
Lead Channel Impedance Value: 304 Ohm
Lead Channel Impedance Value: 399 Ohm
Lead Channel Impedance Value: 456 Ohm
Lead Channel Pacing Threshold Amplitude: 0.875 V
Lead Channel Pacing Threshold Amplitude: 1 V
Lead Channel Pacing Threshold Pulse Width: 0.4 ms
Lead Channel Pacing Threshold Pulse Width: 0.4 ms
Lead Channel Sensing Intrinsic Amplitude: 0.75 mV
Lead Channel Sensing Intrinsic Amplitude: 1 mV
Lead Channel Sensing Intrinsic Amplitude: 7.625 mV
Lead Channel Sensing Intrinsic Amplitude: 8.125 mV
Lead Channel Setting Pacing Amplitude: 2.75 V
Lead Channel Setting Pacing Pulse Width: 0.4 ms
Lead Channel Setting Sensing Sensitivity: 1.2 mV
MDC IDC LEAD LOCATION: 753860
MDC IDC MSMT BATTERY VOLTAGE: 3.16 V
MDC IDC PG IMPLANT DT: 20190905
MDC IDC SET LEADCHNL RA PACING AMPLITUDE: 3.5 V

## 2018-08-25 ENCOUNTER — Telehealth: Payer: Self-pay | Admitting: Cardiology

## 2018-08-25 NOTE — Telephone Encounter (Signed)
Daughter calls in for pt. Reports pt has experienced GI upset since starting the loading dose of Amiodarone last week.   Pt is taking it with food but that is not helping the GI upset. Pt is currently taking 400 mg BID. She understands I will discuss w/ Camnitz and let her know his recommendation.   Made aware it may be tomorrow before return call as doctor is not in the office today.  She is agreeable to plan.  Will ask Camnitz if ok to decrease to 200 mg BID....Marland KitchenMarland Kitchen

## 2018-08-25 NOTE — Telephone Encounter (Signed)
Spoke to dtr about DCCV.   Instructions reviewed.  Aware to arrive by noon on 3/11 for DCCV.  NPO after 5am morning of DCCV.   Pt will have lab work completed in the hospital prior to procedure. Follow up scheduled for 6/15 in [place] office. Dtr verbalized understanding and agreeable to plan.   She understands I will let them know about Amiodarone by tomorrow.

## 2018-08-27 NOTE — Telephone Encounter (Signed)
Advised to decrease Amiodarone to 200 mg BID for the next 3 weeks, then decrease to 200 mg once daily. Dtr is agreeable to plan and will instruct pt on updated instructions.

## 2018-09-01 ENCOUNTER — Ambulatory Visit (INDEPENDENT_AMBULATORY_CARE_PROVIDER_SITE_OTHER): Payer: Medicare HMO | Admitting: *Deleted

## 2018-09-01 DIAGNOSIS — I495 Sick sinus syndrome: Secondary | ICD-10-CM | POA: Diagnosis not present

## 2018-09-01 DIAGNOSIS — I480 Paroxysmal atrial fibrillation: Secondary | ICD-10-CM

## 2018-09-02 ENCOUNTER — Telehealth: Payer: Self-pay

## 2018-09-02 LAB — CUP PACEART REMOTE DEVICE CHECK
Battery Remaining Longevity: 149 mo
Battery Voltage: 3.09 V
Brady Statistic AP VP Percent: 94.28 %
Brady Statistic AS VS Percent: 1.38 %
Brady Statistic RA Percent Paced: 82.42 %
Brady Statistic RV Percent Paced: 91.69 %
Date Time Interrogation Session: 20200310163644
Implantable Lead Implant Date: 20190905
Implantable Lead Implant Date: 20190905
Implantable Lead Location: 753859
Implantable Lead Location: 753860
Implantable Lead Model: 3830
Implantable Lead Model: 5076
Implantable Pulse Generator Implant Date: 20190905
Lead Channel Impedance Value: 266 Ohm
Lead Channel Impedance Value: 304 Ohm
Lead Channel Impedance Value: 399 Ohm
Lead Channel Impedance Value: 418 Ohm
Lead Channel Pacing Threshold Amplitude: 1.125 V
Lead Channel Pacing Threshold Amplitude: 1.125 V
Lead Channel Pacing Threshold Pulse Width: 0.4 ms
Lead Channel Sensing Intrinsic Amplitude: 2.875 mV
Lead Channel Sensing Intrinsic Amplitude: 2.875 mV
Lead Channel Sensing Intrinsic Amplitude: 8 mV
Lead Channel Setting Pacing Amplitude: 2.5 V
Lead Channel Setting Pacing Amplitude: 2.5 V
Lead Channel Setting Pacing Pulse Width: 0.4 ms
Lead Channel Setting Sensing Sensitivity: 1.2 mV
MDC IDC MSMT LEADCHNL RV PACING THRESHOLD PULSEWIDTH: 0.4 ms
MDC IDC MSMT LEADCHNL RV SENSING INTR AMPL: 8 mV
MDC IDC STAT BRADY AP VS PERCENT: 0.55 %
MDC IDC STAT BRADY AS VP PERCENT: 3.84 %

## 2018-09-02 NOTE — Telephone Encounter (Signed)
Left message for patient to remind of missed remote transmission.  

## 2018-09-03 ENCOUNTER — Other Ambulatory Visit: Payer: Self-pay

## 2018-09-03 ENCOUNTER — Encounter (HOSPITAL_COMMUNITY): Payer: Self-pay | Admitting: *Deleted

## 2018-09-03 ENCOUNTER — Encounter (HOSPITAL_COMMUNITY)
Admission: RE | Disposition: A | Discharge status: Home / Self Care | Payer: Self-pay | Source: Home / Self Care | Attending: Cardiology

## 2018-09-03 ENCOUNTER — Ambulatory Visit (HOSPITAL_COMMUNITY)
Admission: RE | Admit: 2018-09-03 | Discharge: 2018-09-03 | Disposition: A | Discharge status: Home / Self Care | Payer: Medicare HMO | Attending: Cardiology | Admitting: Cardiology

## 2018-09-03 DIAGNOSIS — I4891 Unspecified atrial fibrillation: Secondary | ICD-10-CM | POA: Diagnosis not present

## 2018-09-03 DIAGNOSIS — Z538 Procedure and treatment not carried out for other reasons: Secondary | ICD-10-CM | POA: Insufficient documentation

## 2018-09-03 SURGERY — CANCELLED PROCEDURE

## 2018-09-03 NOTE — Progress Notes (Signed)
Patient arrived to Endoscopy unit. Patient placed on monitor and appeared to be paced, but with p-waves. Medtronic rep confirmed NSR. MD made aware. Patient discharged to home prior to entering procedure room.

## 2018-09-08 DIAGNOSIS — I480 Paroxysmal atrial fibrillation: Secondary | ICD-10-CM | POA: Diagnosis not present

## 2018-09-08 DIAGNOSIS — K219 Gastro-esophageal reflux disease without esophagitis: Secondary | ICD-10-CM | POA: Diagnosis not present

## 2018-09-08 DIAGNOSIS — D539 Nutritional anemia, unspecified: Secondary | ICD-10-CM | POA: Diagnosis not present

## 2018-09-08 DIAGNOSIS — N189 Chronic kidney disease, unspecified: Secondary | ICD-10-CM | POA: Diagnosis not present

## 2018-09-08 DIAGNOSIS — R05 Cough: Secondary | ICD-10-CM | POA: Diagnosis not present

## 2018-09-08 DIAGNOSIS — E785 Hyperlipidemia, unspecified: Secondary | ICD-10-CM | POA: Diagnosis not present

## 2018-09-08 DIAGNOSIS — Z6821 Body mass index (BMI) 21.0-21.9, adult: Secondary | ICD-10-CM | POA: Diagnosis not present

## 2018-09-08 DIAGNOSIS — I10 Essential (primary) hypertension: Secondary | ICD-10-CM | POA: Diagnosis not present

## 2018-09-08 DIAGNOSIS — E1121 Type 2 diabetes mellitus with diabetic nephropathy: Secondary | ICD-10-CM | POA: Diagnosis not present

## 2018-09-10 NOTE — Progress Notes (Signed)
Remote pacemaker transmission.   

## 2018-09-19 ENCOUNTER — Other Ambulatory Visit: Payer: Self-pay | Admitting: Nurse Practitioner

## 2018-10-21 DIAGNOSIS — C4441 Basal cell carcinoma of skin of scalp and neck: Secondary | ICD-10-CM | POA: Diagnosis not present

## 2018-10-21 DIAGNOSIS — D225 Melanocytic nevi of trunk: Secondary | ICD-10-CM | POA: Diagnosis not present

## 2018-10-21 DIAGNOSIS — L570 Actinic keratosis: Secondary | ICD-10-CM | POA: Diagnosis not present

## 2018-10-21 DIAGNOSIS — D485 Neoplasm of uncertain behavior of skin: Secondary | ICD-10-CM | POA: Diagnosis not present

## 2018-10-21 DIAGNOSIS — L821 Other seborrheic keratosis: Secondary | ICD-10-CM | POA: Diagnosis not present

## 2018-10-21 DIAGNOSIS — Z85828 Personal history of other malignant neoplasm of skin: Secondary | ICD-10-CM | POA: Diagnosis not present

## 2018-11-04 DIAGNOSIS — H6123 Impacted cerumen, bilateral: Secondary | ICD-10-CM | POA: Diagnosis not present

## 2018-12-01 ENCOUNTER — Ambulatory Visit (INDEPENDENT_AMBULATORY_CARE_PROVIDER_SITE_OTHER): Payer: Medicare HMO | Admitting: *Deleted

## 2018-12-01 DIAGNOSIS — I480 Paroxysmal atrial fibrillation: Secondary | ICD-10-CM

## 2018-12-01 DIAGNOSIS — I495 Sick sinus syndrome: Secondary | ICD-10-CM | POA: Diagnosis not present

## 2018-12-02 ENCOUNTER — Telehealth: Payer: Self-pay

## 2018-12-02 ENCOUNTER — Telehealth: Payer: Self-pay | Admitting: *Deleted

## 2018-12-02 NOTE — Telephone Encounter (Signed)

## 2018-12-02 NOTE — Telephone Encounter (Signed)
Left message for patient to remind of missed remote transmission.  

## 2018-12-03 LAB — CUP PACEART REMOTE DEVICE CHECK
Battery Remaining Longevity: 130 mo
Battery Voltage: 3.03 V
Brady Statistic AP VP Percent: 98.37 %
Brady Statistic AP VS Percent: 0.6 %
Brady Statistic AS VP Percent: 0.04 %
Brady Statistic AS VS Percent: 0.98 %
Brady Statistic RA Percent Paced: 98.63 %
Brady Statistic RV Percent Paced: 97.56 %
Date Time Interrogation Session: 20200610115329
Implantable Lead Implant Date: 20190905
Implantable Lead Implant Date: 20190905
Implantable Lead Location: 753859
Implantable Lead Location: 753860
Implantable Lead Model: 3830
Implantable Lead Model: 5076
Implantable Pulse Generator Implant Date: 20190905
Lead Channel Impedance Value: 285 Ohm
Lead Channel Impedance Value: 304 Ohm
Lead Channel Impedance Value: 418 Ohm
Lead Channel Impedance Value: 418 Ohm
Lead Channel Pacing Threshold Amplitude: 1 V
Lead Channel Pacing Threshold Amplitude: 1.375 V
Lead Channel Pacing Threshold Pulse Width: 0.4 ms
Lead Channel Pacing Threshold Pulse Width: 0.4 ms
Lead Channel Sensing Intrinsic Amplitude: 1.625 mV
Lead Channel Sensing Intrinsic Amplitude: 1.625 mV
Lead Channel Sensing Intrinsic Amplitude: 8.25 mV
Lead Channel Sensing Intrinsic Amplitude: 8.25 mV
Lead Channel Setting Pacing Amplitude: 2 V
Lead Channel Setting Pacing Amplitude: 2.75 V
Lead Channel Setting Pacing Pulse Width: 0.4 ms
Lead Channel Setting Sensing Sensitivity: 1.2 mV

## 2018-12-08 ENCOUNTER — Other Ambulatory Visit: Payer: Self-pay

## 2018-12-08 ENCOUNTER — Telehealth (INDEPENDENT_AMBULATORY_CARE_PROVIDER_SITE_OTHER): Payer: Medicare HMO | Admitting: Cardiology

## 2018-12-08 ENCOUNTER — Encounter: Payer: Self-pay | Admitting: Cardiology

## 2018-12-08 DIAGNOSIS — I480 Paroxysmal atrial fibrillation: Secondary | ICD-10-CM | POA: Diagnosis not present

## 2018-12-08 NOTE — Progress Notes (Signed)
Electrophysiology TeleHealth Note   Due to national recommendations of social distancing due to COVID 19, an audio/video telehealth visit is felt to be most appropriate for this patient at this time.  See Epic message for the patient's consent to telehealth for [facility].   Date:  12/08/2018   ID:  Bryan Carney, DOB [birth year], MRN [id number]  Location: patient's home  Provider location: [address] [place]  Evaluation Performed: Follow-up visit  PCP:  [name], NP  Cardiologist:  [name], MD  Electrophysiologist:  Dr [name]  Chief Complaint:  AF  History of Present Illness:    Bryan Carney is a 78 y.o. male who presents via audio/video conferencing for a telehealth visit today.  Since last being seen in our clinic, the patient reports doing very well.  Today, he denies symptoms of palpitations, chest pain, shortness of breath,  lower extremity edema, dizziness, presyncope, or syncope.  The patient is otherwise without complaint today.  The patient denies symptoms of fevers, chills, cough, or new SOB worrisome for COVID 19.  He has a history of paroxysmal atrial fibrillation, coronary artery disease, diabetes, pulmonary embolism, hypertension, hyperlipidemia, pulmonary venoocclusive disease.  He had a cardiac monitor that showed atrial fibrillation and atrial flutter as well as a 4.3-second pause as well as junctional bradycardia.  He had nonsustained VT and is now status post Medtronic dual-chamber pacemaker 02/27/2018.  Today, denies symptoms of palpitations, chest pain, shortness of breath, orthopnea, PND, lower extremity edema, claudication, dizziness, presyncope, syncope, bleeding, or neurologic sequela. The patient is tolerating medications without difficulties.  Overall he is doing well without complaint.  Past Medical History:  Diagnosis Date  . CAD (coronary artery disease)   . Diabetes ([place])   . Hx pulmonary embolism   . Hyperlipidemia    . Hypertension   . Prostate cancer ([place])   . PVOD (pulmonary veno-occlusive disease) ([place])   . Squamous cell carcinoma     Past Surgical History:  Procedure Laterality Date  . BACK SURGERY    . CARDIAC CATHETERIZATION  10/15/1997   Recommend complete revascularization by CABG  . CARDIOVASCULAR STRESS TEST  06/12/2012   No evidence of ischemia  . CAROTID DOPPLER  08/27/2012   Rt bulb/proximal ICA demonstrated a mild-moderate amount of fibrous plaque without evidence of a significant diameter reduction or any other vascular abnormality.  . CAROTID ENDARTERECTOMY Left 02/23/1995  . CORONARY ARTERY BYPASS GRAFT  10/19/1997   x5. LIMA-LAD, SVG-PDA, SVG-OM1, seq SVG-fist and second branches of ramus intermedius.  Marland Kitchen PACEMAKER IMPLANT N/A 02/27/2018   Procedure: PACEMAKER IMPLANT;  Surgeon: Constance Haw, MD;  Location: [facility];  Service: Cardiovascular;  Laterality: N/A;    Current Outpatient Medications  Medication Sig Dispense Refill  . amiodarone (PACERONE) 200 MG tablet Take 2 tablets (400 mg total) twice a day for 2 weeks, then decrease to take 1 tablet (200 mg total) twice a day for 2 weeks, then decrease to 1 tablet once daily (Patient taking differently: Take 200 mg by mouth daily. ) 84 tablet 0  . apixaban (ELIQUIS) 5 MG TABS tablet Take 1 tablet (5 mg total) by mouth 2 (two) times daily. 60 tablet 0  . aspirin EC 81 MG tablet Take 81 mg by mouth daily.    . benazepril (LOTENSIN) 40 MG tablet Take 1 tablet (40 mg total) by mouth daily. 30 tablet 0  . diltiazem (CARDIZEM CD) 240 MG 24 hr  capsule Take 1 capsule (240 mg total) by mouth daily. 90 capsule 1  . ferrous sulfate 325 (65 FE) MG tablet Take 325 mg by mouth daily with breakfast.    . glimepiride (AMARYL) 4 MG tablet Take 1 tablet (4 mg total) by mouth daily with breakfast. 30 tablet 0  . linagliptin (TRADJENTA) 5 MG TABS tablet Take 1 tablet (5 mg total) by mouth daily. 30 tablet 0  . Liniments (BLUE-EMU SUPER  STRENGTH EX) Apply 1 application topically 3 (three) times daily as needed (pain.).    Marland Kitchen protein supplement shake (PREMIER PROTEIN) LIQD Take 59.1 mLs (2 oz total) by mouth 2 (two) times daily between meals.  0  . simvastatin (ZOCOR) 20 MG tablet Take 0.5 tablets (10 mg total) by mouth at bedtime. (Patient taking differently: Take 10 mg by mouth daily. ) 30 tablet 0   No current facility-administered medications for this visit.     Allergies:   Lopressor [metoprolol tartrate] and Lipitor [atorvastatin]   Social History:  The patient  reports that he has never smoked. He has never used smokeless tobacco. He reports that he does not drink alcohol.   Family History:  The patient's  family history includes Cancer in his sister and sister; Diabetes in his brother, brother, brother, and brother; Heart attack in his father.   ROS:  Please see the history of present illness.   All other systems are personally reviewed and negative.    Exam:    Vital Signs:  BP 128/65   Pulse 60   Well appearing, alert and conversant, regular work of breathing,  good skin color Eyes- anicteric, neuro- grossly intact, skin- no apparent rash or lesions or cyanosis, mouth- oral mucosa is pink   Labs/Other Tests and Data Reviewed:    Recent Labs: 02/20/2018: BUN 29; Hemoglobin 14.3; Platelets 257; Potassium 5.1; Sodium 142 02/27/2018: Creatinine, Ser 1.57   Wt Readings from Last 3 Encounters:  08/19/18 133 lb (60.3 kg)  06/02/18 135 lb (61.2 kg)  03/26/18 132 lb 6.4 oz (60.1 kg)     Other studies personally reviewed: Additional studies/ records that were reviewed today include: ECG 08/19/2018 personally reviewed Review of the above records today demonstrates: Atrial fibrillation, ventricular paced  Last device remote is reviewed from [place] PDF dated 12/03/2018 which reveals normal device function, atrial fibrillation 1.3%   ASSESSMENT & PLAN:    1.  Paroxysmal atrial fibrillation/flutter: Currently on  Eliquis and amiodarone.  He has minimal atrial fibrillation.  No changes at this time.  He is having labs done at his primary physician's office next week.  We Kaysi Ourada asked her to check a TSH and LFTs.  This patients CHA2DS2-VASc Score and unadjusted Ischemic Stroke Rate (% per year) is equal to 7.2 % stroke rate/year from a score of 5  Above score calculated as 1 point each if present [CHF, HTN, DM, Vascular=MI/PAD/Aortic Plaque, Age if 65-74, or Male] Above score calculated as 2 points each if present [Age > 75, or Stroke/TIA/TE]  2.  Coronary artery disease: Status post CABG in 1999.  No current chest pain.  3.  Mild aortic stenosis: Found in 2018.  No current symptoms.  4.  Tachybradycardia syndrome: Status post Medtronic dual-chamber pacemaker implanted 02/27/2018.  Device functioning appropriately.  No changes.    COVID 19 screen The patient denies symptoms of COVID 19 at this time.  The importance of social distancing was discussed today.  Follow-up: 6 months  Current medicines are reviewed at  length with the patient today.   The patient does not have concerns regarding his medicines.  The following changes were made today:  none  Labs/ tests ordered today include:  No orders of the defined types were placed in this encounter.    Patient Risk:  after full review of this patients clinical status, I feel that they are at moderate risk at this time.  Today, I have spent 8 minutes with the patient with telehealth technology discussing atrial fibrillation, pacemaker.    Signed, Anis Degidio Meredith Leeds, MD  12/08/2018 3:35 PM     [address] [place] [address] [phone] (office) [phone] (fax)

## 2018-12-10 NOTE — Progress Notes (Signed)
Remote pacemaker transmission.   

## 2018-12-11 IMAGING — CT CT CHEST W/ CM
2 of 3 series · 15 of 36 positions shown, 18 images · IV contrast (Omni 300)
Comparison: Radiograph yesterday.

CLINICAL DATA: Chest wall pain and bruising, progressive. On
anticoagulation.

EXAM:
CT CHEST WITH CONTRAST
TECHNIQUE: Multidetector CT imaging of the chest was performed during
intravenous contrast administration.
CONTRAST:  75mL GT0B0O-Y11 IOPAMIDOL (GT0B0O-Y11) INJECTION 61%

[Series 3: chest with 2mm st · axial · 0.70mm/px · z∈[+1105,+1371]mm · 12 of 157 slices shown, 15 images]
[im 12/157  mediastinal]
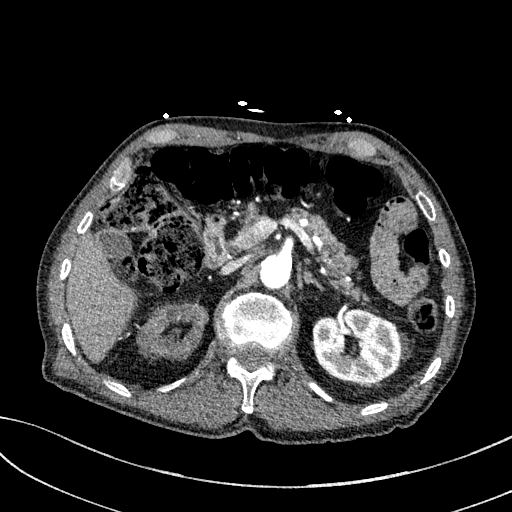
[im 12/157  lung]
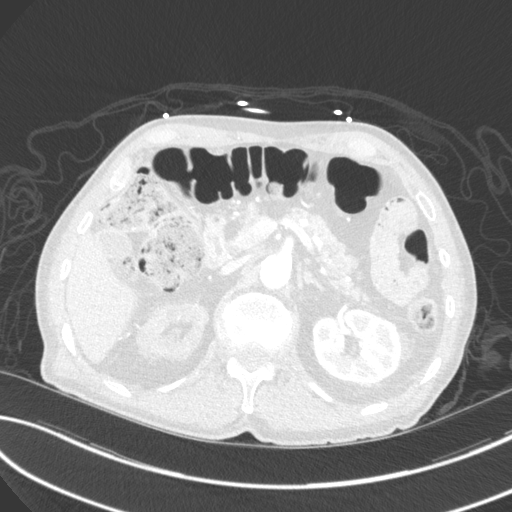
[im 24/157  lung]
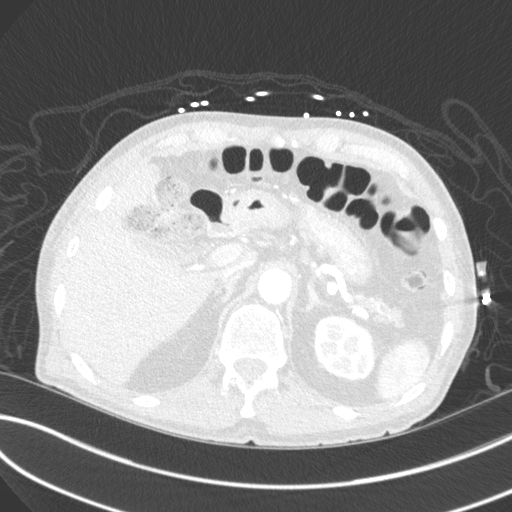
[im 35/157  lung]
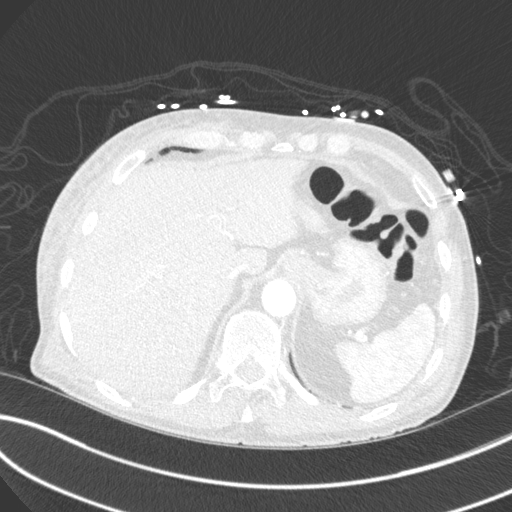
[im 47/157  lung]
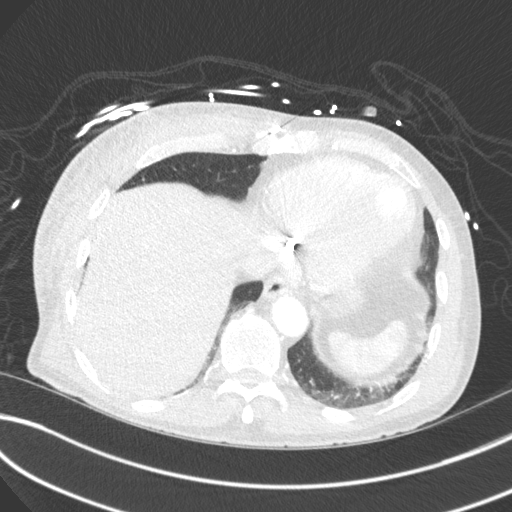
[im 58/157  mediastinal]
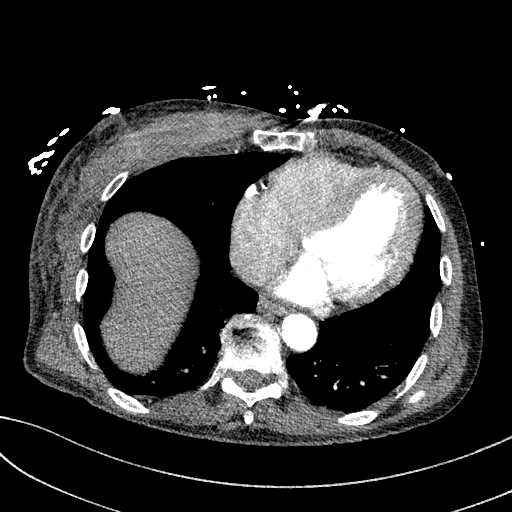
[im 58/157  lung]
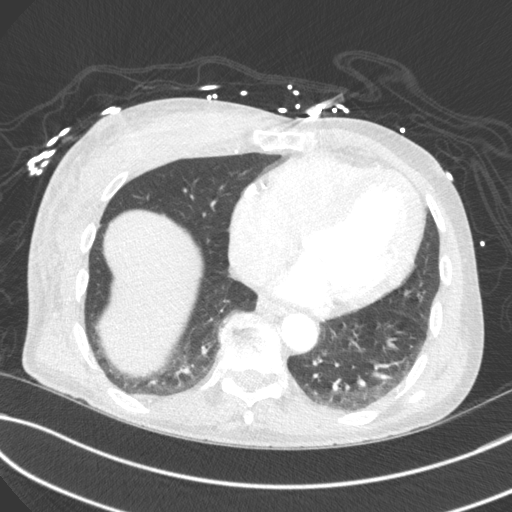
[im 70/157  lung]
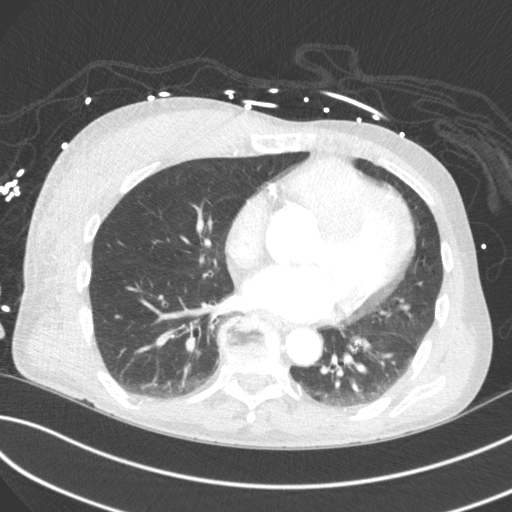
[im 87/157  lung]
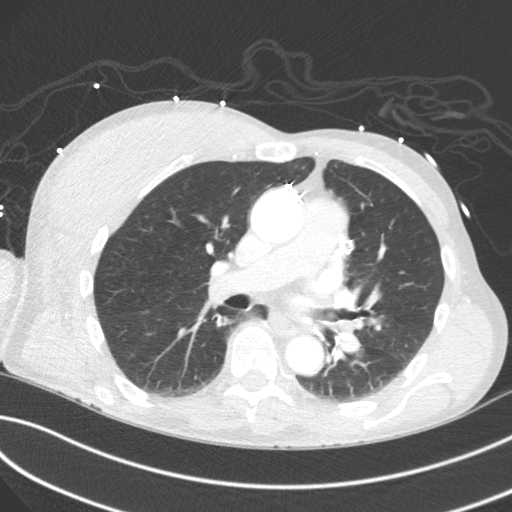
[im 99/157  lung]
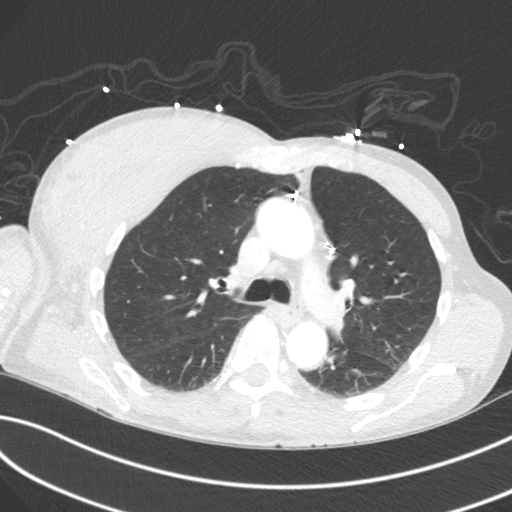
[im 110/157  mediastinal]
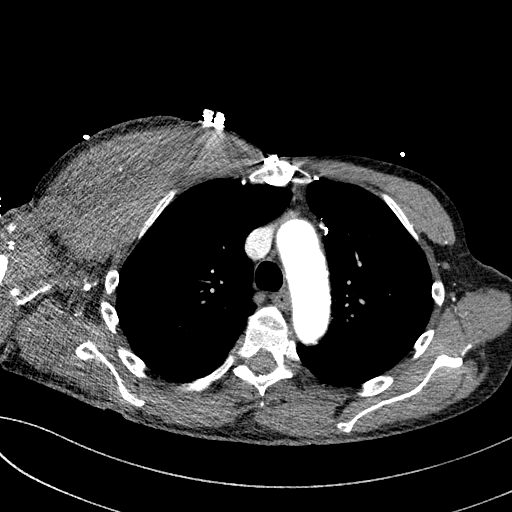
[im 110/157  lung]
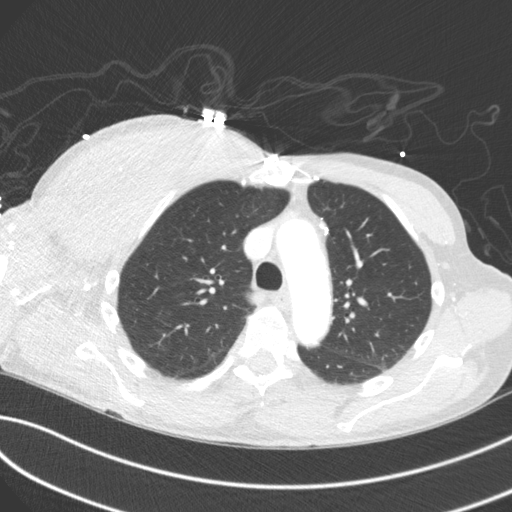
[im 122/157  lung]
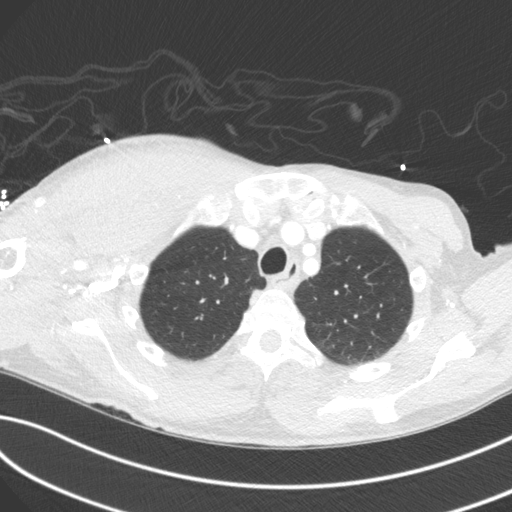
[im 133/157  lung]
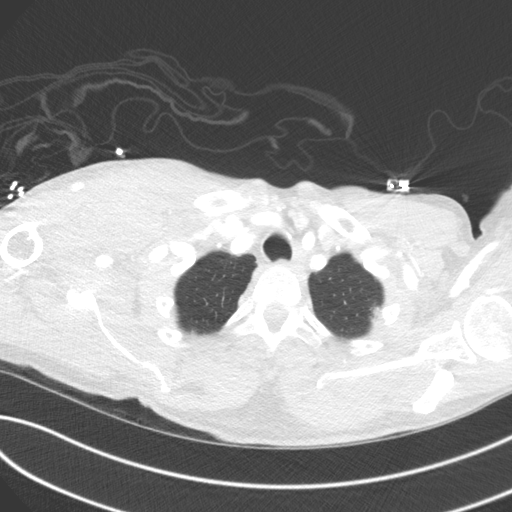
[im 145/157  lung]
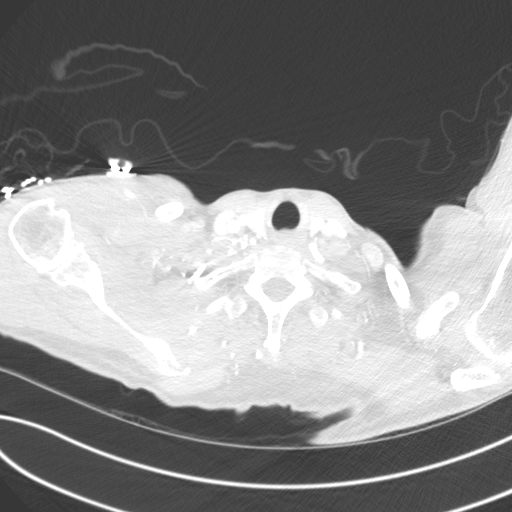

[Series 5: chest with 3mm st cor · coronal · 0.61mm/px · 3 of 83 slices shown]
[im 17/83  lung]
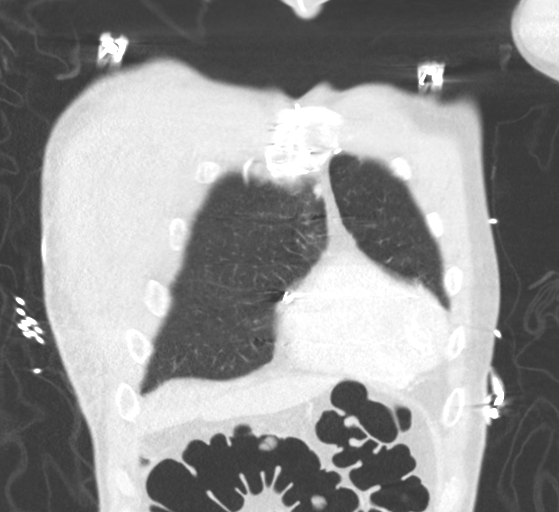
[im 33/83  lung]
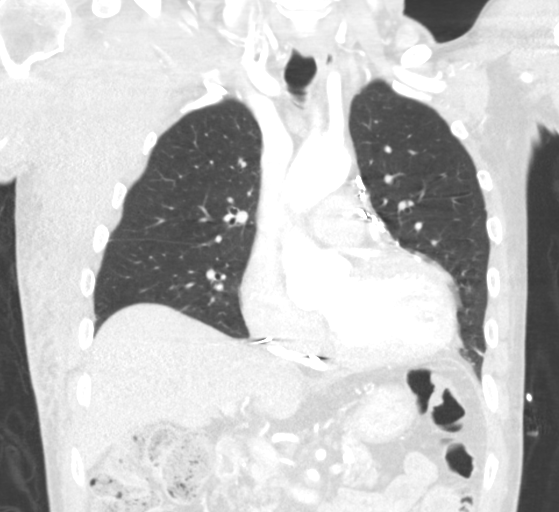
[im 50/83  lung]
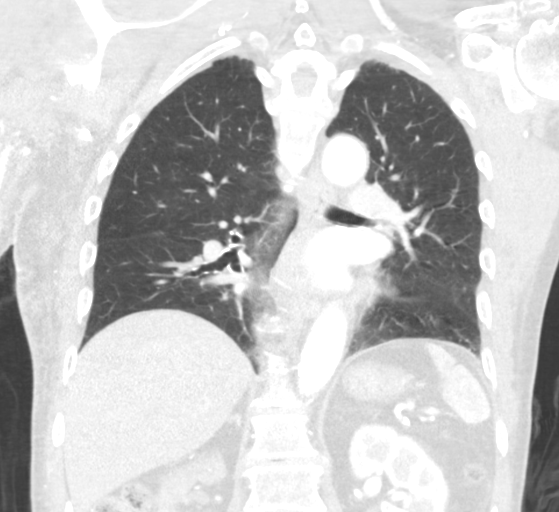

[15 of 36 positions shown; findings below may reference images not displayed]

FINDINGS: Cardiovascular: Post CABG with atherosclerosis of native coronary
arteries. No aortic dissection. Ascending thoracic aorta measures
3.4 cm with small atherosclerosis. Cannot assess for pulmonary
embolus given phase of visualized thyroid gland is normal. No
pericardial effusion. Contrast. Borderline heart size. Questionable
right subclavian/axillary venous stenosis or occlusion with
collaterals in the right axilla.

Mediastinum/Nodes: No adenopathy. The esophagus is decompressed. The
visualized thyroid gland is normal.

Lungs/Pleura: Mild breathing motion artifact at the lung bases. Mild
dependent atelectasis. No consolidation, pulmonary edema or pleural
fluid.

Upper Abdomen: Diminished enhancement of the right kidney with
occlusion of the right renal artery at its origin, some distal
arterial reconstitution. Right renal atrophy seen on prior abdominal
CT.

Musculoskeletal: Heterogeneous enlargement of the right pectoralis
muscle and poorly defined subpectoral soft tissues consistent with
hematoma. No active extravasation. Soft tissue stranding extends
inferiorly on the chest wall. No rib fracture or evidence of acute
osseous abnormality. Degenerative change in the thoracic spine with
vacuum phenomenon and endplate spurring.
IMPRESSION: 1. Large right pectoralis/sub pectoralis chest wall hematoma. No
active extravasation.
2. Dependent atelectasis in the lung bases, otherwise no acute
intrathoracic abnormality.
3. Aortic atherosclerosis. Post CABG with native coronary artery
calcifications.
4. Incidentally noted in the upper abdomen right renal atrophy and
diminished enhancement. There is occlusion of the right renal artery
at the origin, with some distal reconstitution of arterial flow.
This is likely chronic as right renal atrophy was seen on prior
noncontrast abdominal CT.
5. Probable right subclavian venous stenosis or occlusion with chest
wall collaterals, suggesting this is chronic.

Aortic Atherosclerosis (RLDW9-FTK.K).

## 2018-12-15 DIAGNOSIS — Z6822 Body mass index (BMI) 22.0-22.9, adult: Secondary | ICD-10-CM | POA: Diagnosis not present

## 2018-12-15 DIAGNOSIS — E1121 Type 2 diabetes mellitus with diabetic nephropathy: Secondary | ICD-10-CM | POA: Diagnosis not present

## 2018-12-15 DIAGNOSIS — R05 Cough: Secondary | ICD-10-CM | POA: Diagnosis not present

## 2018-12-15 DIAGNOSIS — K219 Gastro-esophageal reflux disease without esophagitis: Secondary | ICD-10-CM | POA: Diagnosis not present

## 2018-12-15 DIAGNOSIS — I480 Paroxysmal atrial fibrillation: Secondary | ICD-10-CM | POA: Diagnosis not present

## 2018-12-15 DIAGNOSIS — N189 Chronic kidney disease, unspecified: Secondary | ICD-10-CM | POA: Diagnosis not present

## 2018-12-15 DIAGNOSIS — E785 Hyperlipidemia, unspecified: Secondary | ICD-10-CM | POA: Diagnosis not present

## 2018-12-15 DIAGNOSIS — I10 Essential (primary) hypertension: Secondary | ICD-10-CM | POA: Diagnosis not present

## 2018-12-15 DIAGNOSIS — D539 Nutritional anemia, unspecified: Secondary | ICD-10-CM | POA: Diagnosis not present

## 2018-12-23 DIAGNOSIS — Z136 Encounter for screening for cardiovascular disorders: Secondary | ICD-10-CM | POA: Diagnosis not present

## 2018-12-23 DIAGNOSIS — Z9181 History of falling: Secondary | ICD-10-CM | POA: Diagnosis not present

## 2018-12-23 DIAGNOSIS — E785 Hyperlipidemia, unspecified: Secondary | ICD-10-CM | POA: Diagnosis not present

## 2018-12-23 DIAGNOSIS — Z1331 Encounter for screening for depression: Secondary | ICD-10-CM | POA: Diagnosis not present

## 2018-12-23 DIAGNOSIS — Z125 Encounter for screening for malignant neoplasm of prostate: Secondary | ICD-10-CM | POA: Diagnosis not present

## 2018-12-23 DIAGNOSIS — Z0000 Encounter for general adult medical examination without abnormal findings: Secondary | ICD-10-CM | POA: Diagnosis not present

## 2018-12-31 DIAGNOSIS — N189 Chronic kidney disease, unspecified: Secondary | ICD-10-CM | POA: Diagnosis not present

## 2019-01-15 DIAGNOSIS — N189 Chronic kidney disease, unspecified: Secondary | ICD-10-CM | POA: Diagnosis not present

## 2019-01-21 DIAGNOSIS — N189 Chronic kidney disease, unspecified: Secondary | ICD-10-CM | POA: Diagnosis not present

## 2019-03-03 ENCOUNTER — Ambulatory Visit (INDEPENDENT_AMBULATORY_CARE_PROVIDER_SITE_OTHER): Payer: Medicare HMO | Admitting: *Deleted

## 2019-03-03 DIAGNOSIS — I495 Sick sinus syndrome: Secondary | ICD-10-CM | POA: Diagnosis not present

## 2019-03-04 ENCOUNTER — Other Ambulatory Visit: Payer: Self-pay

## 2019-03-04 ENCOUNTER — Ambulatory Visit (INDEPENDENT_AMBULATORY_CARE_PROVIDER_SITE_OTHER): Payer: Medicare HMO

## 2019-03-04 DIAGNOSIS — I350 Nonrheumatic aortic (valve) stenosis: Secondary | ICD-10-CM | POA: Diagnosis not present

## 2019-03-04 NOTE — Progress Notes (Signed)
Complete echocardiogram has been performed.  Jimmy Kamareon Sciandra, RDCS, RVT 

## 2019-03-05 ENCOUNTER — Telehealth: Payer: Self-pay | Admitting: Cardiovascular Disease

## 2019-03-05 DIAGNOSIS — I350 Nonrheumatic aortic (valve) stenosis: Secondary | ICD-10-CM

## 2019-03-05 NOTE — Telephone Encounter (Signed)
New message   Patient's daughter states that she is returning a call from yesterday from Dr. Kennon Holter office. Please call.

## 2019-03-05 NOTE — Telephone Encounter (Signed)
DPR on file. Reviewed the following EKG results with pt daughter:  Notes recorded by Lorretta Harp, MD on 03/04/2019 at 1:19 PM EDT  No change from 2D echo 1 year ago. Repeat 12 months  She verbalized understanding. Repeat order in Epic

## 2019-03-06 LAB — CUP PACEART REMOTE DEVICE CHECK
Battery Remaining Longevity: 128 mo
Battery Voltage: 3.01 V
Brady Statistic AP VP Percent: 98.88 %
Brady Statistic AP VS Percent: 0.52 %
Brady Statistic AS VP Percent: 0.01 %
Brady Statistic AS VS Percent: 0.59 %
Brady Statistic RA Percent Paced: 99.97 %
Brady Statistic RV Percent Paced: 98.89 %
Date Time Interrogation Session: 20200911021954
Implantable Lead Implant Date: 20190905
Implantable Lead Implant Date: 20190905
Implantable Lead Location: 753859
Implantable Lead Location: 753860
Implantable Lead Model: 3830
Implantable Lead Model: 5076
Implantable Pulse Generator Implant Date: 20190905
Lead Channel Impedance Value: 266 Ohm
Lead Channel Impedance Value: 285 Ohm
Lead Channel Impedance Value: 380 Ohm
Lead Channel Impedance Value: 399 Ohm
Lead Channel Pacing Threshold Amplitude: 0.875 V
Lead Channel Pacing Threshold Amplitude: 1.125 V
Lead Channel Pacing Threshold Pulse Width: 0.4 ms
Lead Channel Pacing Threshold Pulse Width: 0.4 ms
Lead Channel Sensing Intrinsic Amplitude: 1.625 mV
Lead Channel Sensing Intrinsic Amplitude: 1.625 mV
Lead Channel Sensing Intrinsic Amplitude: 8.375 mV
Lead Channel Sensing Intrinsic Amplitude: 8.375 mV
Lead Channel Setting Pacing Amplitude: 1.75 V
Lead Channel Setting Pacing Amplitude: 2.5 V
Lead Channel Setting Pacing Pulse Width: 0.4 ms
Lead Channel Setting Sensing Sensitivity: 1.2 mV

## 2019-03-10 DIAGNOSIS — I129 Hypertensive chronic kidney disease with stage 1 through stage 4 chronic kidney disease, or unspecified chronic kidney disease: Secondary | ICD-10-CM | POA: Diagnosis not present

## 2019-03-10 DIAGNOSIS — D638 Anemia in other chronic diseases classified elsewhere: Secondary | ICD-10-CM | POA: Diagnosis not present

## 2019-03-10 DIAGNOSIS — N183 Chronic kidney disease, stage 3 (moderate): Secondary | ICD-10-CM | POA: Diagnosis not present

## 2019-03-16 ENCOUNTER — Other Ambulatory Visit: Payer: Self-pay | Admitting: Nurse Practitioner

## 2019-03-19 ENCOUNTER — Encounter: Payer: Self-pay | Admitting: Cardiology

## 2019-03-19 NOTE — Progress Notes (Signed)
Remote pacemaker transmission.   

## 2019-03-31 DIAGNOSIS — E785 Hyperlipidemia, unspecified: Secondary | ICD-10-CM | POA: Diagnosis not present

## 2019-03-31 DIAGNOSIS — I10 Essential (primary) hypertension: Secondary | ICD-10-CM | POA: Diagnosis not present

## 2019-03-31 DIAGNOSIS — E1121 Type 2 diabetes mellitus with diabetic nephropathy: Secondary | ICD-10-CM | POA: Diagnosis not present

## 2019-03-31 DIAGNOSIS — Z23 Encounter for immunization: Secondary | ICD-10-CM | POA: Diagnosis not present

## 2019-03-31 DIAGNOSIS — D539 Nutritional anemia, unspecified: Secondary | ICD-10-CM | POA: Diagnosis not present

## 2019-03-31 DIAGNOSIS — Z139 Encounter for screening, unspecified: Secondary | ICD-10-CM | POA: Diagnosis not present

## 2019-03-31 DIAGNOSIS — K219 Gastro-esophageal reflux disease without esophagitis: Secondary | ICD-10-CM | POA: Diagnosis not present

## 2019-03-31 DIAGNOSIS — I480 Paroxysmal atrial fibrillation: Secondary | ICD-10-CM | POA: Diagnosis not present

## 2019-03-31 DIAGNOSIS — N189 Chronic kidney disease, unspecified: Secondary | ICD-10-CM | POA: Diagnosis not present

## 2019-04-03 ENCOUNTER — Other Ambulatory Visit: Payer: Self-pay | Admitting: Cardiology

## 2019-04-03 NOTE — Telephone Encounter (Signed)
This is a [place] pt °

## 2019-04-06 NOTE — Telephone Encounter (Signed)
Please refill.  Thank you 

## 2019-06-02 ENCOUNTER — Ambulatory Visit (INDEPENDENT_AMBULATORY_CARE_PROVIDER_SITE_OTHER): Payer: Medicare HMO | Admitting: *Deleted

## 2019-06-02 DIAGNOSIS — I495 Sick sinus syndrome: Secondary | ICD-10-CM | POA: Diagnosis not present

## 2019-06-02 LAB — CUP PACEART REMOTE DEVICE CHECK
Date Time Interrogation Session: 20201208081031
Implantable Lead Implant Date: 20190905
Implantable Lead Implant Date: 20190905
Implantable Lead Location: 753859
Implantable Lead Location: 753860
Implantable Lead Model: 3830
Implantable Lead Model: 5076
Implantable Pulse Generator Implant Date: 20190905

## 2019-06-30 ENCOUNTER — Other Ambulatory Visit: Payer: Self-pay

## 2019-06-30 ENCOUNTER — Encounter: Payer: Self-pay | Admitting: Podiatry

## 2019-06-30 ENCOUNTER — Ambulatory Visit: Payer: Medicare HMO | Admitting: Podiatry

## 2019-06-30 DIAGNOSIS — B351 Tinea unguium: Secondary | ICD-10-CM | POA: Diagnosis not present

## 2019-06-30 DIAGNOSIS — L84 Corns and callosities: Secondary | ICD-10-CM | POA: Diagnosis not present

## 2019-06-30 DIAGNOSIS — M2041 Other hammer toe(s) (acquired), right foot: Secondary | ICD-10-CM | POA: Diagnosis not present

## 2019-06-30 DIAGNOSIS — M2042 Other hammer toe(s) (acquired), left foot: Secondary | ICD-10-CM

## 2019-06-30 DIAGNOSIS — E119 Type 2 diabetes mellitus without complications: Secondary | ICD-10-CM | POA: Diagnosis not present

## 2019-06-30 DIAGNOSIS — M21962 Unspecified acquired deformity of left lower leg: Secondary | ICD-10-CM | POA: Diagnosis not present

## 2019-06-30 DIAGNOSIS — D689 Coagulation defect, unspecified: Secondary | ICD-10-CM | POA: Diagnosis not present

## 2019-06-30 DIAGNOSIS — M21961 Unspecified acquired deformity of right lower leg: Secondary | ICD-10-CM

## 2019-06-30 DIAGNOSIS — E1151 Type 2 diabetes mellitus with diabetic peripheral angiopathy without gangrene: Secondary | ICD-10-CM

## 2019-06-30 DIAGNOSIS — I739 Peripheral vascular disease, unspecified: Secondary | ICD-10-CM | POA: Diagnosis not present

## 2019-06-30 NOTE — Progress Notes (Signed)
  Subjective:  Patient ID: Bryan Carney, male    DOB: 08/28/1940,  MRN: [id number]  Chief Complaint  Patient presents with  . Nail Problem    BL hallux pinched toenails (R>L) x 2 mo; no pain/redness/swellgin Tx: trimming   . Diabetes    FBS: 92 x 1 day A1C: 7 PCP: Bryan Carney x 3 mo    79 y.o. male presents with the above complaint. History confirmed with patient.   Objective:  Physical Exam: warm, good capillary refill, nail exam onychomycosis of the toenails and pincer nails, no trophic changes or ulcerative lesions, decreased DP and PT pulses, and normal sensory exam. HPK submet 3/4 bilat Left Foot: normal exam, no swelling, tenderness, instability; ligaments intact, full range of motion of all ankle/foot joints  Right Foot: normal exam, no swelling, tenderness, instability; ligaments intact, full range of motion of all ankle/foot joints   No images are attached to the encounter.  Assessment:   1. Encounter for diabetic foot exam ([place])   2. Coagulation defect ([place])   3. Diabetes mellitus type 2 with peripheral artery disease ([place])   4. Hammertoes of both feet   5. Deformity of metatarsal bone of left foot   6. Metatarsal deformity, right   7. Onychomycosis   8. Callus      Plan:  Patient was evaluated and treated and all questions answered.  Diabetes, PAD and Coagulation Defect -Patient is diabetic with a qualifying condition for at risk foot care. -Would benefit from DM shoes. Will make appt for fabrication  Procedure: Nail Debridement Rationale: Patient meets criteria for routine foot care due to PAD, coag defect Type of Debridement: manual, sharp debridement. Instrumentation: Nail nipper, rotary burr. Number of Nails: 10  Procedure: Paring of Lesion Rationale: painful hyperkeratotic lesion Type of Debridement: manual, sharp debridement. Instrumentation: 312 blade Number of Lesions: 4  Return in about 3 months (around 09/28/2019).   MDM Number of Diagnoses or  Management Options Callus: new, needed workup Coagulation defect [facility]): new, needed workup Deformity of metatarsal bone of left foot: new, needed workup Diabetes mellitus type 2 with peripheral artery disease ([place]): new, needed workup Encounter for diabetic foot exam ([place]): new, needed workup Hammertoes of both feet: new, needed workup Metatarsal deformity, right: new, needed workup Onychomycosis: new, needed workup   Amount and/or Complexity of Data Reviewed Decide to obtain previous medical records or to obtain history from someone other than the patient: yes  Risk of Complications, Morbidity, and/or Mortality Presenting problems: low Management options: low

## 2019-07-02 DIAGNOSIS — E1121 Type 2 diabetes mellitus with diabetic nephropathy: Secondary | ICD-10-CM | POA: Diagnosis not present

## 2019-07-02 DIAGNOSIS — D539 Nutritional anemia, unspecified: Secondary | ICD-10-CM | POA: Diagnosis not present

## 2019-07-02 DIAGNOSIS — I480 Paroxysmal atrial fibrillation: Secondary | ICD-10-CM | POA: Diagnosis not present

## 2019-07-02 DIAGNOSIS — E785 Hyperlipidemia, unspecified: Secondary | ICD-10-CM | POA: Diagnosis not present

## 2019-07-02 DIAGNOSIS — Z6822 Body mass index (BMI) 22.0-22.9, adult: Secondary | ICD-10-CM | POA: Diagnosis not present

## 2019-07-02 DIAGNOSIS — I10 Essential (primary) hypertension: Secondary | ICD-10-CM | POA: Diagnosis not present

## 2019-07-02 DIAGNOSIS — N189 Chronic kidney disease, unspecified: Secondary | ICD-10-CM | POA: Diagnosis not present

## 2019-07-02 DIAGNOSIS — K219 Gastro-esophageal reflux disease without esophagitis: Secondary | ICD-10-CM | POA: Diagnosis not present

## 2019-07-07 NOTE — Progress Notes (Signed)
PPM remote 

## 2019-07-29 DIAGNOSIS — L570 Actinic keratosis: Secondary | ICD-10-CM | POA: Diagnosis not present

## 2019-07-29 DIAGNOSIS — Z85828 Personal history of other malignant neoplasm of skin: Secondary | ICD-10-CM | POA: Diagnosis not present

## 2019-07-29 DIAGNOSIS — C4442 Squamous cell carcinoma of skin of scalp and neck: Secondary | ICD-10-CM | POA: Diagnosis not present

## 2019-07-29 DIAGNOSIS — D485 Neoplasm of uncertain behavior of skin: Secondary | ICD-10-CM | POA: Diagnosis not present

## 2019-09-01 ENCOUNTER — Ambulatory Visit (INDEPENDENT_AMBULATORY_CARE_PROVIDER_SITE_OTHER): Payer: Medicare HMO | Admitting: *Deleted

## 2019-09-01 DIAGNOSIS — I495 Sick sinus syndrome: Secondary | ICD-10-CM

## 2019-09-03 LAB — CUP PACEART REMOTE DEVICE CHECK
Battery Remaining Longevity: 103 mo
Battery Voltage: 3 V
Brady Statistic AP VP Percent: 98.74 %
Brady Statistic AP VS Percent: 0.67 %
Brady Statistic AS VP Percent: 0.01 %
Brady Statistic AS VS Percent: 0.57 %
Brady Statistic RA Percent Paced: 99.97 %
Brady Statistic RV Percent Paced: 98.75 %
Date Time Interrogation Session: 20210311083357
Implantable Lead Implant Date: 20190905
Implantable Lead Implant Date: 20190905
Implantable Lead Location: 753859
Implantable Lead Location: 753860
Implantable Lead Model: 3830
Implantable Lead Model: 5076
Implantable Pulse Generator Implant Date: 20190905
Lead Channel Impedance Value: 266 Ohm
Lead Channel Impedance Value: 285 Ohm
Lead Channel Impedance Value: 380 Ohm
Lead Channel Impedance Value: 399 Ohm
Lead Channel Pacing Threshold Amplitude: 1 V
Lead Channel Pacing Threshold Amplitude: 1.375 V
Lead Channel Pacing Threshold Pulse Width: 0.4 ms
Lead Channel Pacing Threshold Pulse Width: 0.4 ms
Lead Channel Sensing Intrinsic Amplitude: 1.625 mV
Lead Channel Sensing Intrinsic Amplitude: 1.625 mV
Lead Channel Sensing Intrinsic Amplitude: 8.125 mV
Lead Channel Sensing Intrinsic Amplitude: 8.125 mV
Lead Channel Setting Pacing Amplitude: 2 V
Lead Channel Setting Pacing Amplitude: 3 V
Lead Channel Setting Pacing Pulse Width: 0.4 ms
Lead Channel Setting Sensing Sensitivity: 1.2 mV

## 2019-09-03 NOTE — Progress Notes (Signed)
PPM Remote  

## 2019-10-02 ENCOUNTER — Other Ambulatory Visit: Payer: Self-pay | Admitting: Cardiology

## 2019-10-06 ENCOUNTER — Ambulatory Visit: Payer: Medicare HMO | Admitting: Podiatry

## 2019-10-12 ENCOUNTER — Other Ambulatory Visit: Payer: Self-pay

## 2019-10-12 ENCOUNTER — Ambulatory Visit: Payer: Medicare HMO | Admitting: Podiatry

## 2019-10-12 DIAGNOSIS — E1151 Type 2 diabetes mellitus with diabetic peripheral angiopathy without gangrene: Secondary | ICD-10-CM

## 2019-10-12 DIAGNOSIS — E1169 Type 2 diabetes mellitus with other specified complication: Secondary | ICD-10-CM | POA: Diagnosis not present

## 2019-10-12 DIAGNOSIS — B351 Tinea unguium: Secondary | ICD-10-CM

## 2019-10-12 NOTE — Progress Notes (Signed)
  Subjective:  Patient ID: Bryan Carney, male    DOB: [birth year],  MRN: [id number]  Chief Complaint  Patient presents with  . debride    diabetc nail trimming   79 y.o. male presents with the above complaint. History confirmed with patient.   Objective:  Physical Exam: warm, good capillary refill, nail exam onychomycosis of the toenails and pincer nails, no trophic changes or ulcerative lesions, decreased DP and PT pulses, and normal sensory exam. HPK submet 3/4 bilat Left Foot: normal exam, no swelling, tenderness, instability; ligaments intact, full range of motion of all ankle/foot joints  Right Foot: normal exam, no swelling, tenderness, instability; ligaments intact, full range of motion of all ankle/foot joints   No images are attached to the encounter.  Assessment:   1. Onychomycosis of multiple toenails with type 2 diabetes mellitus and peripheral angiopathy ([name])     Plan:  Patient was evaluated and treated and all questions answered.  Diabetes, PAD and Coagulation Defect -Patient is diabetic with a qualifying condition for at risk foot care.   Procedure: Nail Debridement Rationale: Patient meets criteria for routine foot care due to PAD Type of Debridement: manual, sharp debridement. Instrumentation: Nail nipper, rotary burr. Number of Nails: 10

## 2019-10-15 DIAGNOSIS — E1121 Type 2 diabetes mellitus with diabetic nephropathy: Secondary | ICD-10-CM | POA: Diagnosis not present

## 2019-10-15 DIAGNOSIS — I480 Paroxysmal atrial fibrillation: Secondary | ICD-10-CM | POA: Diagnosis not present

## 2019-10-15 DIAGNOSIS — N189 Chronic kidney disease, unspecified: Secondary | ICD-10-CM | POA: Diagnosis not present

## 2019-10-15 DIAGNOSIS — K219 Gastro-esophageal reflux disease without esophagitis: Secondary | ICD-10-CM | POA: Diagnosis not present

## 2019-10-15 DIAGNOSIS — I10 Essential (primary) hypertension: Secondary | ICD-10-CM | POA: Diagnosis not present

## 2019-10-15 DIAGNOSIS — D539 Nutritional anemia, unspecified: Secondary | ICD-10-CM | POA: Diagnosis not present

## 2019-10-15 DIAGNOSIS — Z6821 Body mass index (BMI) 21.0-21.9, adult: Secondary | ICD-10-CM | POA: Diagnosis not present

## 2019-10-15 DIAGNOSIS — E785 Hyperlipidemia, unspecified: Secondary | ICD-10-CM | POA: Diagnosis not present

## 2019-11-02 ENCOUNTER — Other Ambulatory Visit: Payer: Self-pay | Admitting: Cardiology

## 2019-12-01 ENCOUNTER — Ambulatory Visit (INDEPENDENT_AMBULATORY_CARE_PROVIDER_SITE_OTHER): Payer: Medicare HMO | Admitting: *Deleted

## 2019-12-01 DIAGNOSIS — I495 Sick sinus syndrome: Secondary | ICD-10-CM | POA: Diagnosis not present

## 2019-12-03 LAB — CUP PACEART REMOTE DEVICE CHECK
Battery Remaining Longevity: 84 mo
Battery Voltage: 2.98 V
Brady Statistic AP VP Percent: 99.06 %
Brady Statistic AP VS Percent: 0.61 %
Brady Statistic AS VP Percent: 0.01 %
Brady Statistic AS VS Percent: 0.32 %
Brady Statistic RA Percent Paced: 99.97 %
Brady Statistic RV Percent Paced: 99.08 %
Date Time Interrogation Session: 20210609211052
Implantable Lead Implant Date: 20190905
Implantable Lead Implant Date: 20190905
Implantable Lead Location: 753859
Implantable Lead Location: 753860
Implantable Lead Model: 3830
Implantable Lead Model: 5076
Implantable Pulse Generator Implant Date: 20190905
Lead Channel Impedance Value: 247 Ohm
Lead Channel Impedance Value: 266 Ohm
Lead Channel Impedance Value: 342 Ohm
Lead Channel Impedance Value: 361 Ohm
Lead Channel Pacing Threshold Amplitude: 0.75 V
Lead Channel Pacing Threshold Amplitude: 1.625 V
Lead Channel Pacing Threshold Pulse Width: 0.4 ms
Lead Channel Pacing Threshold Pulse Width: 0.4 ms
Lead Channel Sensing Intrinsic Amplitude: 1.875 mV
Lead Channel Sensing Intrinsic Amplitude: 1.875 mV
Lead Channel Sensing Intrinsic Amplitude: 7.875 mV
Lead Channel Sensing Intrinsic Amplitude: 7.875 mV
Lead Channel Setting Pacing Amplitude: 1.5 V
Lead Channel Setting Pacing Amplitude: 3.75 V
Lead Channel Setting Pacing Pulse Width: 0.4 ms
Lead Channel Setting Sensing Sensitivity: 1.2 mV

## 2019-12-07 NOTE — Progress Notes (Signed)
Remote pacemaker transmission.   

## 2019-12-14 ENCOUNTER — Other Ambulatory Visit: Payer: Self-pay

## 2019-12-14 MED ORDER — DILTIAZEM HCL ER COATED BEADS 240 MG PO CP24: 240.0000 mg | ORAL_CAPSULE | Freq: Every day | ORAL | 0 refills | Status: DC

## 2020-01-03 NOTE — Progress Notes (Signed)
Electrophysiology Office Note   Date:  01/04/2020   ID:  Bryan Carney, Bryan Carney 1940-07-16, MRN [id number]  PCP:  [name], NP  Cardiologist:  [name] Primary Electrophysiologist:  Nijah Orlich Meredith Leeds, MD    No chief complaint on file.    History of Present Illness: Bryan Carney is a 79 y.o. male who is being seen today for the evaluation of tachy/brady syndrome at the request of [name]. Presenting today for electrophysiology evaluation.  He has a history of paroxysmal atrial fibrillation, coronary artery disease, diabetes, pulmonary embolism, hypertension, hyperlipidemia, pulmonary venoocclusive disease.  He was admitted to the hospital 12/17/2017 with a chest wall hematoma and acute blood loss anemia.  He was found to have aspiration pneumonia, paroxysmal atrial fibrillation and altered mental status.  His Coumadin at the time was changed to Eliquis.  He had a CABG in 1999 and left carotid endarterectomy in 1996.  Presents today with symptomatic atrial fibrillation.  He is in and out of atrial fibrillation quite a bit.  He feels well today, but when he is in atrial fibrillation he has weakness, fatigue, and shortness of breath.  He has no palpitations.  He wore a cardiac monitor that showed atrial fibrillation and atrial flutter as well as up to a 4.3-second pause as well as significant junctional bradycardia into the 30s during waking hours.  He also had some nonsustained VT. he is now status post Medtronic dual-chamber pacemaker implanted 02/27/2018.  Today, denies symptoms of palpitations, chest pain, shortness of breath, orthopnea, PND, lower extremity edema, claudication, dizziness, presyncope, syncope, bleeding, or neurologic sequela. The patient is tolerating medications without difficulties.  He continues to work up to 5 days a week.  He is currently feeling well without complaints.  He is able to do all of his daily activities.  He walks up to 2 miles a day at work.   Past  Medical History:  Diagnosis Date  . CAD (coronary artery disease)   . Diabetes ([place])   . Hx pulmonary embolism   . Hyperlipidemia   . Hypertension   . Prostate cancer ([place])   . PVOD (pulmonary veno-occlusive disease) ([place])   . Squamous cell carcinoma    Past Surgical History:  Procedure Laterality Date  . BACK SURGERY    . CARDIAC CATHETERIZATION  10/15/1997   Recommend complete revascularization by CABG  . CARDIOVASCULAR STRESS TEST  06/12/2012   No evidence of ischemia  . CAROTID DOPPLER  08/27/2012   Rt bulb/proximal ICA demonstrated a mild-moderate amount of fibrous plaque without evidence of a significant diameter reduction or any other vascular abnormality.  . CAROTID ENDARTERECTOMY Left 02/23/1995  . CORONARY ARTERY BYPASS GRAFT  10/19/1997   x5. LIMA-LAD, SVG-PDA, SVG-OM1, seq SVG-fist and second branches of ramus intermedius.  Marland Kitchen PACEMAKER IMPLANT N/A 02/27/2018   Procedure: PACEMAKER IMPLANT;  Surgeon: Constance Haw, MD;  Location: [facility];  Service: Cardiovascular;  Laterality: N/A;     Current Outpatient Medications  Medication Sig Dispense Refill  . amiodarone (PACERONE) 200 MG tablet TAKE ONE TABLET BY MOUTH DAILY--please make overdue follow up appt for further refills [phone] 1st attempt 90 tablet 3  . apixaban (ELIQUIS) 5 MG TABS tablet Take 1 tablet (5 mg total) by mouth 2 (two) times daily. 60 tablet 0  . aspirin EC 81 MG tablet Take 81 mg by mouth daily.    . benazepril (LOTENSIN) 40 MG tablet Take 1 tablet (40 mg total) by mouth  daily. 30 tablet 0  . cetirizine (ZYRTEC) 10 MG tablet Take 10 mg by mouth daily.    Marland Kitchen diltiazem (CARDIZEM CD) 240 MG 24 hr capsule Take 1 capsule (240 mg total) by mouth daily. Please keep upcoming appt in July with Dr. [name] before anymore refills. Thank you 90 capsule 0  . ferrous sulfate 325 (65 FE) MG tablet Take 325 mg by mouth daily with breakfast.    . glimepiride (AMARYL) 4 MG tablet Take 1 tablet (4 mg total)  by mouth daily with breakfast. 30 tablet 0  . linagliptin (TRADJENTA) 5 MG TABS tablet Take 1 tablet (5 mg total) by mouth daily. 30 tablet 0  . simvastatin (ZOCOR) 20 MG tablet Take 0.5 tablets (10 mg total) by mouth at bedtime. (Patient taking differently: Take 10 mg by mouth daily. ) 30 tablet 0   No current facility-administered medications for this visit.    Allergies:   Lopressor [metoprolol tartrate] and Lipitor [atorvastatin]   Social History:  The patient  reports that he has never smoked. He has never used smokeless tobacco. He reports that he does not drink alcohol.   Family History:  The patient's family history includes Cancer in his sister and sister; Diabetes in his brother, brother, brother, and brother; Heart attack in his father.    ROS:  Please see the history of present illness.   Otherwise, review of systems is positive for none.   All other systems are reviewed and negative.   PHYSICAL EXAM: VS:  BP (!) 116/50   Pulse 60   Ht _0  (1.676 m)   Wt 127 lb (57.6 kg)   SpO2 98%   BMI 20.50 kg/m  , BMI Body mass index is 20.5 kg/m. GEN: Well nourished, well developed, in no acute distress  HEENT: normal  Neck: no JVD, carotid bruits, or masses Cardiac: RRR; no murmurs, rubs, or gallops,no edema  Respiratory:  clear to auscultation bilaterally, normal work of breathing GI: soft, nontender, nondistended, + BS MS: no deformity or atrophy  Skin: warm and dry, device site well healed Neuro:  Strength and sensation are intact Psych: euthymic mood, full affect  EKG:  EKG is ordered today. Personal review of the ekg ordered shows AV paced  Personal review of the device interrogation today. Results in [place]: No results found for requested labs within last 8760 hours.    Lipid Panel  No results found for: CHOL, TRIG, HDL, CHOLHDL, VLDL, LDLCALC, LDLDIRECT   Wt Readings from Last 3 Encounters:  01/04/20 127 lb (57.6 kg)  08/19/18 133 lb (60.3  kg)  06/02/18 135 lb (61.2 kg)      Other studies Reviewed: Additional studies/ records that were reviewed today include: TTE 03/24/17  Review of the above records today demonstrates:  - Left ventricle: The cavity size was normal. Wall thickness was   increased in a pattern of moderate LVH. Systolic function was   normal. The estimated ejection fraction was in the range of 50%   to 55%. Wall motion was normal; there were no regional wall   motion abnormalities. - Aortic valve: Trileaflet; mildly thickened, mildly calcified   leaflets. Valve mobility was moderately restricted. Transvalvular   velocity was increased. There was mild to moderate stenosis.   Valve area (VTI): 1.52 cm^2. Valve area (Vmax): 1.56 cm^2. Valve   area (Vmean): 1.4 cm^2. - Aortic root: The aortic root was mildly dilated. - Mitral valve: Mildly to moderately calcified annulus.  There was   mild to moderate regurgitation. - Left atrium: The atrium was moderately dilated.  30-day monitor personally reviewed atrial fibrillation and atrial flutter with significant pauses as well as significant junctional bradycardia.  ASSESSMENT AND PLAN:  1.  Persistent atrial fibrillation/atrial flutter: Currently on amiodarone and Eliquis.  CHA2DS2-VASc of 5.  Remains in sinus rhythm.  Bryan Carney continue amiodarone.  2.  Coronary artery disease: CABG in 1999.  No current chest pain.  3.  Mild aortic stenosis: Found on echo in 2018.  Asymptomatic.  4.  Tachybradycardia syndrome: Status post Medtronic dual-chamber pacemaker implanted 02/27/2018.  Device functioning appropriately.  He is not dependent today.  Bryan have changed his AV delays to allow intrinsic AV conduction.  Current medicines are reviewed at length with the patient today.   The patient does not have concerns regarding his medicines.  The following changes were made today: None  Labs/ tests ordered today include:  Orders Placed This Encounter  Procedures  . TSH  .  Comp Met (CMET)  . CBC  . EKG 12-Lead    Disposition:   FU with Camelle Henkels 6 20 months  Signed, Jonae Renshaw Meredith Leeds, MD  01/04/2020 12:14 PM     [place] [address] [phone] (office) [phone] (fax)

## 2020-01-04 ENCOUNTER — Encounter: Payer: Self-pay | Admitting: Cardiology

## 2020-01-04 ENCOUNTER — Other Ambulatory Visit: Payer: Self-pay

## 2020-01-04 ENCOUNTER — Ambulatory Visit (INDEPENDENT_AMBULATORY_CARE_PROVIDER_SITE_OTHER): Payer: Medicare HMO | Admitting: Cardiology

## 2020-01-04 VITALS — BP 116/50 | HR 60 | Ht 66.0 in | Wt 127.0 lb

## 2020-01-04 DIAGNOSIS — Z79899 Other long term (current) drug therapy: Secondary | ICD-10-CM

## 2020-01-04 DIAGNOSIS — I495 Sick sinus syndrome: Secondary | ICD-10-CM | POA: Diagnosis not present

## 2020-01-04 DIAGNOSIS — I480 Paroxysmal atrial fibrillation: Secondary | ICD-10-CM

## 2020-01-04 DIAGNOSIS — Z950 Presence of cardiac pacemaker: Secondary | ICD-10-CM

## 2020-01-04 LAB — CUP PACEART INCLINIC DEVICE CHECK
Battery Remaining Longevity: 89 mo
Battery Voltage: 2.98 V
Brady Statistic AP VP Percent: 98.76 %
Brady Statistic AP VS Percent: 0.61 %
Brady Statistic AS VP Percent: 0.02 %
Brady Statistic AS VS Percent: 0.61 %
Brady Statistic RA Percent Paced: 99.72 %
Brady Statistic RV Percent Paced: 98.62 %
Date Time Interrogation Session: 20210712133318
Implantable Lead Implant Date: 20190905
Implantable Lead Implant Date: 20190905
Implantable Lead Location: 753859
Implantable Lead Location: 753860
Implantable Lead Model: 3830
Implantable Lead Model: 5076
Implantable Pulse Generator Implant Date: 20190905
Lead Channel Impedance Value: 266 Ohm
Lead Channel Impedance Value: 266 Ohm
Lead Channel Impedance Value: 361 Ohm
Lead Channel Impedance Value: 380 Ohm
Lead Channel Pacing Threshold Amplitude: 1 V
Lead Channel Pacing Threshold Amplitude: 1.25 V
Lead Channel Pacing Threshold Pulse Width: 0.4 ms
Lead Channel Pacing Threshold Pulse Width: 0.8 ms
Lead Channel Sensing Intrinsic Amplitude: 1.75 mV
Lead Channel Sensing Intrinsic Amplitude: 6.75 mV
Lead Channel Setting Pacing Amplitude: 2 V
Lead Channel Setting Pacing Amplitude: 2.5 V
Lead Channel Setting Pacing Pulse Width: 0.8 ms
Lead Channel Setting Sensing Sensitivity: 1.2 mV

## 2020-01-04 NOTE — Patient Instructions (Signed)
Medication Instructions:  Your physician recommends that you continue on your current medications as directed. Please refer to the Current Medication list given to you today.  *If you need a refill on your cardiac medications before your next appointment, please call your pharmacy*   Lab Work: Today: TSH, CMET & CBC If you have labs (blood work) drawn today and your tests are completely normal, you will receive your results only by: Marland Kitchen MyChart Message (if you have MyChart) OR . A paper copy in the mail If you have any lab test that is abnormal or we need to change your treatment, we will call you to review the results.   Testing/Procedures: None ordered   Follow-Up: At [facility], you and your health needs are our priority.  As part of our continuing mission to provide you with exceptional heart care, we have created designated Provider Care Teams.  These Care Teams include your primary Cardiologist (physician) and Advanced Practice Providers (APPs -  Physician Assistants and Nurse Practitioners) who all work together to provide you with the care you need, when you need it.  We recommend signing up for the patient portal called "MyChart".  Sign up information is provided on this After Visit Summary.  MyChart is used to connect with patients for Virtual Visits (Telemedicine).  Patients are able to view lab/test results, encounter notes, upcoming appointments, etc.  Non-urgent messages can be sent to your provider as well.   To learn more about what you can do with MyChart, go to [email or web address].    Your next appointment:   6 month(s)  The format for your next appointment:   In Person  Provider:   Allegra Lai, MD   Thank you for choosing [facility]!!   Trinidad Curet, RN [phone]    Other Instructions

## 2020-01-05 LAB — CBC
Hematocrit: 30.1 % — ABNORMAL LOW (ref 37.5–51.0)
Hemoglobin: 9.8 g/dL — ABNORMAL LOW (ref 13.0–17.7)
MCH: 33 pg (ref 26.6–33.0)
MCHC: 32.6 g/dL (ref 31.5–35.7)
MCV: 101 fL — ABNORMAL HIGH (ref 79–97)
Platelets: 236 10*3/uL (ref 150–450)
RBC: 2.97 x10E6/uL — ABNORMAL LOW (ref 4.14–5.80)
RDW: 12 % (ref 11.6–15.4)
WBC: 6.7 10*3/uL (ref 3.4–10.8)

## 2020-01-05 LAB — COMPREHENSIVE METABOLIC PANEL
ALT: 21 IU/L (ref 0–44)
AST: 17 IU/L (ref 0–40)
Albumin/Globulin Ratio: 2 (ref 1.2–2.2)
Albumin: 4.3 g/dL (ref 3.7–4.7)
Alkaline Phosphatase: 136 IU/L — ABNORMAL HIGH (ref 48–121)
BUN/Creatinine Ratio: 18 (ref 10–24)
BUN: 33 mg/dL — ABNORMAL HIGH (ref 8–27)
Bilirubin Total: 0.3 mg/dL (ref 0.0–1.2)
CO2: 20 mmol/L (ref 20–29)
Calcium: 8.9 mg/dL (ref 8.6–10.2)
Chloride: 105 mmol/L (ref 96–106)
Creatinine, Ser: 1.8 mg/dL — ABNORMAL HIGH (ref 0.76–1.27)
GFR calc Af Amer: 40 mL/min/{1.73_m2} — ABNORMAL LOW (ref >59)
GFR calc non Af Amer: 35 mL/min/{1.73_m2} — ABNORMAL LOW (ref >59)
Globulin, Total: 2.1 g/dL (ref 1.5–4.5)
Glucose: 150 mg/dL — ABNORMAL HIGH (ref 65–99)
Potassium: 4.5 mmol/L (ref 3.5–5.2)
Sodium: 139 mmol/L (ref 134–144)
Total Protein: 6.4 g/dL (ref 6.0–8.5)

## 2020-01-05 LAB — TSH: TSH: 1.06 u[IU]/mL (ref 0.450–4.500)

## 2020-01-11 ENCOUNTER — Telehealth: Payer: Self-pay | Admitting: Cardiology

## 2020-01-11 NOTE — Telephone Encounter (Signed)
Follow up   Pt's daughter calling, she said she just wanted to know the result if there's something wrong with pt's labs. She said she will have meeting at 2 pm but still call her if she did not able to answer to leave detailed message

## 2020-01-11 NOTE — Telephone Encounter (Signed)
Patient's daughter calling for lab results. °

## 2020-01-11 NOTE — Telephone Encounter (Signed)
Left detailed message informing dtr of result, per request. Asked to call office back if she would like to further discuss.

## 2020-01-18 ENCOUNTER — Telehealth: Payer: Self-pay

## 2020-01-18 NOTE — Telephone Encounter (Signed)
-----   Message from Thora Lance, RN sent at 01/18/2020 12:31 PM EDT ----- Regarding: device is beeping - Please call.  Pt's wife reports device is beeping.  Thanks you,  Rosann Auerbach

## 2020-01-18 NOTE — Telephone Encounter (Signed)
Spoke with pt who requested I speak with his daughter, [name].  [name] states that yesterday pt's PM beeped.  He did not hear it but his wife did.  No transmissions have been received indicating a reason for the alarm.  [name] will have pt send a manual transmission to confim reason for alarm.   Advised I would call her back after report is received.  She requested callback on pt mobile number

## 2020-01-18 NOTE — Telephone Encounter (Signed)
Manual transmission received.  All resutls WNL.  No indication for alarm  Advised to callback if occurs again.  Educated on ED precautions if pt condition warrants.

## 2020-01-28 DIAGNOSIS — Z8546 Personal history of malignant neoplasm of prostate: Secondary | ICD-10-CM | POA: Diagnosis not present

## 2020-01-28 DIAGNOSIS — K219 Gastro-esophageal reflux disease without esophagitis: Secondary | ICD-10-CM | POA: Diagnosis not present

## 2020-01-28 DIAGNOSIS — D539 Nutritional anemia, unspecified: Secondary | ICD-10-CM | POA: Diagnosis not present

## 2020-01-28 DIAGNOSIS — Z125 Encounter for screening for malignant neoplasm of prostate: Secondary | ICD-10-CM | POA: Diagnosis not present

## 2020-01-28 DIAGNOSIS — Z6821 Body mass index (BMI) 21.0-21.9, adult: Secondary | ICD-10-CM | POA: Diagnosis not present

## 2020-01-28 DIAGNOSIS — E785 Hyperlipidemia, unspecified: Secondary | ICD-10-CM | POA: Diagnosis not present

## 2020-01-28 DIAGNOSIS — N189 Chronic kidney disease, unspecified: Secondary | ICD-10-CM | POA: Diagnosis not present

## 2020-01-28 DIAGNOSIS — I10 Essential (primary) hypertension: Secondary | ICD-10-CM | POA: Diagnosis not present

## 2020-01-28 DIAGNOSIS — E1121 Type 2 diabetes mellitus with diabetic nephropathy: Secondary | ICD-10-CM | POA: Diagnosis not present

## 2020-01-28 DIAGNOSIS — I480 Paroxysmal atrial fibrillation: Secondary | ICD-10-CM | POA: Diagnosis not present

## 2020-02-02 DIAGNOSIS — D044 Carcinoma in situ of skin of scalp and neck: Secondary | ICD-10-CM | POA: Diagnosis not present

## 2020-02-02 DIAGNOSIS — C4442 Squamous cell carcinoma of skin of scalp and neck: Secondary | ICD-10-CM | POA: Diagnosis not present

## 2020-02-02 DIAGNOSIS — L570 Actinic keratosis: Secondary | ICD-10-CM | POA: Diagnosis not present

## 2020-02-02 DIAGNOSIS — D485 Neoplasm of uncertain behavior of skin: Secondary | ICD-10-CM | POA: Diagnosis not present

## 2020-02-02 DIAGNOSIS — Z85828 Personal history of other malignant neoplasm of skin: Secondary | ICD-10-CM | POA: Diagnosis not present

## 2020-03-01 ENCOUNTER — Ambulatory Visit (INDEPENDENT_AMBULATORY_CARE_PROVIDER_SITE_OTHER): Payer: Medicare HMO | Admitting: *Deleted

## 2020-03-01 DIAGNOSIS — I495 Sick sinus syndrome: Secondary | ICD-10-CM

## 2020-03-01 DIAGNOSIS — I480 Paroxysmal atrial fibrillation: Secondary | ICD-10-CM

## 2020-03-01 LAB — CUP PACEART REMOTE DEVICE CHECK
Battery Remaining Longevity: 91 mo
Battery Voltage: 3 V
Brady Statistic AP VP Percent: 0.04 %
Brady Statistic AP VS Percent: 99.93 %
Brady Statistic AS VP Percent: 0 %
Brady Statistic AS VS Percent: 0.03 %
Brady Statistic RA Percent Paced: 99.97 %
Brady Statistic RV Percent Paced: 0.04 %
Date Time Interrogation Session: 20210907070030
Implantable Lead Implant Date: 20190905
Implantable Lead Implant Date: 20190905
Implantable Lead Location: 753859
Implantable Lead Location: 753860
Implantable Lead Model: 3830
Implantable Lead Model: 5076
Implantable Pulse Generator Implant Date: 20190905
Lead Channel Impedance Value: 266 Ohm
Lead Channel Impedance Value: 266 Ohm
Lead Channel Impedance Value: 361 Ohm
Lead Channel Impedance Value: 361 Ohm
Lead Channel Pacing Threshold Amplitude: 0.75 V
Lead Channel Pacing Threshold Amplitude: 1.875 V
Lead Channel Pacing Threshold Pulse Width: 0.4 ms
Lead Channel Pacing Threshold Pulse Width: 0.4 ms
Lead Channel Sensing Intrinsic Amplitude: 1 mV
Lead Channel Sensing Intrinsic Amplitude: 1 mV
Lead Channel Sensing Intrinsic Amplitude: 7.625 mV
Lead Channel Sensing Intrinsic Amplitude: 7.625 mV
Lead Channel Setting Pacing Amplitude: 1.75 V
Lead Channel Setting Pacing Amplitude: 2.5 V
Lead Channel Setting Pacing Pulse Width: 0.8 ms
Lead Channel Setting Sensing Sensitivity: 1.2 mV

## 2020-03-03 NOTE — Progress Notes (Signed)
Remote pacemaker transmission.   

## 2020-03-06 ENCOUNTER — Other Ambulatory Visit: Payer: Self-pay | Admitting: Cardiology

## 2020-03-08 ENCOUNTER — Other Ambulatory Visit: Payer: Self-pay

## 2020-03-08 MED ORDER — DILTIAZEM HCL ER COATED BEADS 240 MG PO CP24: 240.0000 mg | ORAL_CAPSULE | Freq: Every day | ORAL | 3 refills | Status: DC

## 2020-04-20 DIAGNOSIS — Z9181 History of falling: Secondary | ICD-10-CM | POA: Diagnosis not present

## 2020-04-20 DIAGNOSIS — Z0000 Encounter for general adult medical examination without abnormal findings: Secondary | ICD-10-CM | POA: Diagnosis not present

## 2020-04-20 DIAGNOSIS — Z139 Encounter for screening, unspecified: Secondary | ICD-10-CM | POA: Diagnosis not present

## 2020-04-20 DIAGNOSIS — Z1331 Encounter for screening for depression: Secondary | ICD-10-CM | POA: Diagnosis not present

## 2020-04-20 DIAGNOSIS — E785 Hyperlipidemia, unspecified: Secondary | ICD-10-CM | POA: Diagnosis not present

## 2020-04-22 ENCOUNTER — Telehealth: Payer: Self-pay

## 2020-04-22 NOTE — Telephone Encounter (Signed)
Patient called requesting appointment to have his device adjusted because he has felt "really tired" over the past week. Patient reports no CP, SOB, chest pressure, or any other s/sx except generalized fatigue. Remote transmission received and reviewed: Presenting EGM AP/ VS @ 62 bpm, device function WNL, no episodes of AMS or HVR. Advised patient to contact PCP for appointment to follow-up on source of fatigue.

## 2020-04-22 NOTE — Telephone Encounter (Signed)
The pt states he feel really tired lately and needs to be adjusted. He is requesting an appointment with Dr. [name] in [place].

## 2020-05-02 ENCOUNTER — Encounter: Payer: Self-pay | Admitting: Cardiology

## 2020-05-02 ENCOUNTER — Other Ambulatory Visit: Payer: Self-pay

## 2020-05-02 ENCOUNTER — Telehealth: Payer: Self-pay | Admitting: Cardiology

## 2020-05-02 ENCOUNTER — Ambulatory Visit (INDEPENDENT_AMBULATORY_CARE_PROVIDER_SITE_OTHER): Payer: Medicare HMO | Admitting: Cardiology

## 2020-05-02 VITALS — BP 134/76 | HR 63 | Ht 65.0 in | Wt 137.0 lb

## 2020-05-02 DIAGNOSIS — I4819 Other persistent atrial fibrillation: Secondary | ICD-10-CM | POA: Diagnosis not present

## 2020-05-02 DIAGNOSIS — I495 Sick sinus syndrome: Secondary | ICD-10-CM | POA: Diagnosis not present

## 2020-05-02 NOTE — Progress Notes (Signed)
Electrophysiology Office Note   Date:  05/02/2020   ID:  Bryan Carney, Bryan Carney 1941/05/25, MRN [id number]  PCP:  Bryan Dandy, NP  Cardiologist:  [name] Primary Electrophysiologist:  Kaleena Corrow Meredith Leeds, MD    No chief complaint on file.    History of Present Illness: Bryan Carney is a 79 y.o. male who is being seen today for the evaluation of tachy/brady syndrome at the request of Bryan Carney. Presenting today for electrophysiology evaluation.   He has a history of persistent atrial fibrillation, coronary artery disease status post CABG, diabetes, pulmonary embolism, hypertension, hyperlipidemia, and pulmonary venoocclusive disease. He is min to the hospital 12/17/2017 and was found to have a chest wall hematoma and acute blood loss. He had aspiration pneumonia, atrial fibrillation altered mental status. His Coumadin was changed to Eliquis at the time. He had a CABG in 1999 and a carotid endarterectomy in 1996. He is status post Medtronic dual-chamber pacemaker implanted 02/27/2018 for tachybradycardia syndrome.  Today, denies symptoms of palpitations, chest pain, shortness of breath, orthopnea, PND, lower extremity edema, claudication, dizziness, presyncope, syncope, bleeding, or neurologic sequela. The patient is tolerating medications without difficulties.  Over the past week, he has felt weak and fatigued.  It was the worst 2 days ago.  Then yesterday he felt quite improved.  He had quite a bit more energy.  He also had no appetite for those prior days.  Both him and his daughter feel that he could have been in atrial fibrillation.  Reviewed device interrogation shows 0.2% atrial fibrillation burden with multiple episodes 2 days prior to him feeling poorly.  He feels better today, but continues to have some fatigue.   Past Medical History:  Diagnosis Date  . CAD (coronary artery disease)   . Diabetes ([place])   . Hx pulmonary embolism   . Hyperlipidemia   . Hypertension   . Prostate  cancer ([place])   . PVOD (pulmonary veno-occlusive disease) ([place])   . Squamous cell carcinoma    Past Surgical History:  Procedure Laterality Date  . BACK SURGERY    . CARDIAC CATHETERIZATION  10/15/1997   Recommend complete revascularization by CABG  . CARDIOVASCULAR STRESS TEST  06/12/2012   No evidence of ischemia  . CAROTID DOPPLER  08/27/2012   Rt bulb/proximal ICA demonstrated a mild-moderate amount of fibrous plaque without evidence of a significant diameter reduction or any other vascular abnormality.  . CAROTID ENDARTERECTOMY Left 02/23/1995  . CORONARY ARTERY BYPASS GRAFT  10/19/1997   x5. LIMA-LAD, SVG-PDA, SVG-OM1, seq SVG-fist and second branches of ramus intermedius.  Marland Kitchen PACEMAKER IMPLANT N/A 02/27/2018   Procedure: PACEMAKER IMPLANT;  Surgeon: Constance Haw, MD;  Location: [facility];  Service: Cardiovascular;  Laterality: N/A;     Current Outpatient Medications  Medication Sig Dispense Refill  . amiodarone (PACERONE) 200 MG tablet TAKE ONE TABLET BY MOUTH DAILY--please make overdue follow up appt for further refills [phone] 1st attempt 90 tablet 3  . apixaban (ELIQUIS) 5 MG TABS tablet Take 1 tablet (5 mg total) by mouth 2 (two) times daily. 60 tablet 0  . aspirin EC 81 MG tablet Take 81 mg by mouth daily.    . benazepril (LOTENSIN) 40 MG tablet Take 1 tablet (40 mg total) by mouth daily. 30 tablet 0  . cetirizine (ZYRTEC) 10 MG tablet Take 10 mg by mouth daily.    Marland Kitchen diltiazem (CARDIZEM CD) 240 MG 24 hr capsule Take 1 capsule (240 mg total)  by mouth daily. 90 capsule 3  . ferrous sulfate 325 (65 FE) MG tablet Take 325 mg by mouth daily with breakfast.    . glimepiride (AMARYL) 4 MG tablet Take 1 tablet (4 mg total) by mouth daily with breakfast. 30 tablet 0  . linagliptin (TRADJENTA) 5 MG TABS tablet Take 1 tablet (5 mg total) by mouth daily. 30 tablet 0  . simvastatin (ZOCOR) 20 MG tablet Take 0.5 tablets (10 mg total) by mouth at bedtime. (Patient taking  differently: Take 10 mg by mouth daily. ) 30 tablet 0   No current facility-administered medications for this visit.    Allergies:   Lopressor [metoprolol tartrate] and Lipitor [atorvastatin]   Social History:  The patient  reports that he has never smoked. He has never used smokeless tobacco. He reports that he does not drink alcohol.   Family History:  The patient's family history includes Cancer in his sister and sister; Diabetes in his brother, brother, brother, and brother; Heart attack in his father.   ROS:  Please see the history of present illness.   Otherwise, review of systems is positive for none.   All other systems are reviewed and negative.   PHYSICAL EXAM: VS:  BP 134/76   Pulse 63   Ht 5\' 5"  (1.651 m)   Wt 137 lb (62.1 kg)   SpO2 96%   BMI 22.80 kg/m  , BMI Body mass index is 22.8 kg/m. GEN: Well nourished, well developed, in no acute distress  HEENT: normal  Neck: no JVD, carotid bruits, or masses Cardiac: RRR; 3 out of 6 harsh systolic murmur at the base, rubs, or gallops,no edema  Respiratory:  clear to auscultation bilaterally, normal work of breathing GI: soft, nontender, nondistended, + BS MS: no deformity or atrophy  Skin: warm and dry, device site well healed Neuro:  Strength and sensation are intact Psych: euthymic mood, full affect  EKG:  EKG is ordered today. Personal review of the ekg ordered shows atrial paced, rate 63  Personal review of the device interrogation today. Results in [place]: 01/04/2020: ALT 21; BUN 33; Creatinine, Ser 1.80; Hemoglobin 9.8; Platelets 236; Potassium 4.5; Sodium 139; TSH 1.060    Lipid Panel  No results found for: CHOL, TRIG, HDL, CHOLHDL, VLDL, LDLCALC, LDLDIRECT   Wt Readings from Last 3 Encounters:  05/02/20 137 lb (62.1 kg)  01/04/20 127 lb (57.6 kg)  08/19/18 133 lb (60.3 kg)      Other studies Reviewed: Additional studies/ records that were reviewed today include: TTE 03/24/17  Review of  the above records today demonstrates:  - Left ventricle: The cavity size was normal. Wall thickness was   increased in a pattern of moderate LVH. Systolic function was   normal. The estimated ejection fraction was in the range of 50%   to 55%. Wall motion was normal; there were no regional wall   motion abnormalities. - Aortic valve: Trileaflet; mildly thickened, mildly calcified   leaflets. Valve mobility was moderately restricted. Transvalvular   velocity was increased. There was mild to moderate stenosis.   Valve area (VTI): 1.52 cm^2. Valve area (Vmax): 1.56 cm^2. Valve   area (Vmean): 1.4 cm^2. - Aortic root: The aortic root was mildly dilated. - Mitral valve: Mildly to moderately calcified annulus. There was   mild to moderate regurgitation. - Left atrium: The atrium was moderately dilated.  30-day monitor personally reviewed atrial fibrillation and atrial flutter with significant pauses as well as significant  junctional bradycardia.  ASSESSMENT AND PLAN:  1. Persistent atrial fibrillation/flutter: Currently on amiodarone (monitoring for high risk medication) and Eliquis with a CHA2DS2-VASc of 5.  No evidence of atrial fibrillation.  It is unclear to me as to why he was having his symptoms over the weekend, but I do not see any cardiac reason for him to have fatigue.  2. Coronary artery disease: Status post CABG in 1999. No recurrent chest pain.  3. Mild aortic stenosis: Found on echo in 2018. Asymptomatic.  4. Tachybradycardia syndrome: Status post Medtronic dual-chamber pacemaker implanted 02/27/2018. Device functioning appropriately. No changes.   Current medicines are reviewed at length with the patient today.   The patient does not have concerns regarding his medicines.  The following changes were made today: None  Labs/ tests ordered today include:  Orders Placed This Encounter  Procedures  . EKG 12-Lead    Disposition:   FU with Adelisa Satterwhite 6 months  Signed, Emera Bussie  Meredith Leeds, MD  05/02/2020 2:20 PM     [place] [address] [phone] (office) [phone] (fax)

## 2020-05-02 NOTE — Telephone Encounter (Signed)
Called and spoke w/ dtr (DPR on file) Pt scheduled to see Camnitz today at 1:45 in [place]. dtr is agreeable to plan.

## 2020-05-02 NOTE — Patient Instructions (Signed)
Medication Instructions:  Your physician recommends that you continue on your current medications as directed. Please refer to the Current Medication list given to you today.  *If you need a refill on your cardiac medications before your next appointment, please call your pharmacy*   Lab Work: None ordered If you have labs (blood work) drawn today and your tests are completely normal, you will receive your results only by: Marland Kitchen MyChart Message (if you have MyChart) OR . A paper copy in the mail If you have any lab test that is abnormal or we need to change your treatment, we will call you to review the results.   Testing/Procedures: None ordered   Follow-Up: At [facility], you and your health needs are our priority.  As part of our continuing mission to provide you with exceptional heart care, we have created designated Provider Care Teams.  These Care Teams include your primary Cardiologist (physician) and Advanced Practice Providers (APPs -  Physician Assistants and Nurse Practitioners) who all work together to provide you with the care you need, when you need it.  We recommend signing up for the patient portal called "MyChart".  Sign up information is provided on this After Visit Summary.  MyChart is used to connect with patients for Virtual Visits (Telemedicine).  Patients are able to view lab/test results, encounter notes, upcoming appointments, etc.  Non-urgent messages can be sent to your provider as well.   To learn more about what you can do with MyChart, go to [email or web address].    Remote monitoring is used to monitor your Pacemaker or ICD from home. This monitoring reduces the number of office visits required to check your device to one time per year. It allows [name] to keep an eye on the functioning of your device to ensure it is working properly. You are scheduled for a device check from home on 05/31/20. You may send your transmission at any time that day. If you have a  wireless device, the transmission will be sent automatically. After your physician reviews your transmission, you will receive a postcard with your next transmission date.  Your next appointment:   In January 2022  The format for your next appointment:   In Person  Provider:   Allegra Lai, MD   Thank you for choosing [facility]!!   Trinidad Curet, RN [phone]    Other Instructions

## 2020-05-02 NOTE — Telephone Encounter (Signed)
Patient c/o Palpitations:  High priority if patient c/o lightheadedness, shortness of breath, or chest pain  1) How long have you had palpitations/irregular HR/ Afib? Are you having the symptoms now?  Not right now- but it is beating too slow- Started being Afib, Friday, went back on Sunday morning  2) Are you currently experiencing lightheadedness, SOB or CP? Not now, was sob when he was in  Atrial Fib  3) Do you have a history of afib (atrial fibrillation) or irregular heart rhythm?  yes  4) Have you checked your BP or HR? (document readings if available): did not check it  5) Are you experiencing any other symptoms?  He says something is still not right, real sluggish,- pt would like to be seen today please by Dr [name]

## 2020-05-03 ENCOUNTER — Other Ambulatory Visit: Payer: Self-pay

## 2020-05-03 DIAGNOSIS — I350 Nonrheumatic aortic (valve) stenosis: Secondary | ICD-10-CM

## 2020-05-06 DIAGNOSIS — I10 Essential (primary) hypertension: Secondary | ICD-10-CM | POA: Diagnosis not present

## 2020-05-06 DIAGNOSIS — K219 Gastro-esophageal reflux disease without esophagitis: Secondary | ICD-10-CM | POA: Diagnosis not present

## 2020-05-06 DIAGNOSIS — R0600 Dyspnea, unspecified: Secondary | ICD-10-CM | POA: Diagnosis not present

## 2020-05-06 DIAGNOSIS — N189 Chronic kidney disease, unspecified: Secondary | ICD-10-CM | POA: Diagnosis not present

## 2020-05-06 DIAGNOSIS — Z6821 Body mass index (BMI) 21.0-21.9, adult: Secondary | ICD-10-CM | POA: Diagnosis not present

## 2020-05-06 DIAGNOSIS — I480 Paroxysmal atrial fibrillation: Secondary | ICD-10-CM | POA: Diagnosis not present

## 2020-05-06 DIAGNOSIS — E785 Hyperlipidemia, unspecified: Secondary | ICD-10-CM | POA: Diagnosis not present

## 2020-05-06 DIAGNOSIS — D539 Nutritional anemia, unspecified: Secondary | ICD-10-CM | POA: Diagnosis not present

## 2020-05-06 DIAGNOSIS — E1121 Type 2 diabetes mellitus with diabetic nephropathy: Secondary | ICD-10-CM | POA: Diagnosis not present

## 2020-05-10 DIAGNOSIS — J189 Pneumonia, unspecified organism: Secondary | ICD-10-CM | POA: Diagnosis not present

## 2020-05-10 DIAGNOSIS — R0602 Shortness of breath: Secondary | ICD-10-CM | POA: Diagnosis not present

## 2020-05-10 DIAGNOSIS — R059 Cough, unspecified: Secondary | ICD-10-CM | POA: Diagnosis not present

## 2020-05-10 DIAGNOSIS — R0600 Dyspnea, unspecified: Secondary | ICD-10-CM | POA: Diagnosis not present

## 2020-05-12 ENCOUNTER — Inpatient Hospital Stay (HOSPITAL_COMMUNITY)
Admission: EM | Admit: 2020-05-12 | Discharge: 2020-05-14 | DRG: 291 | Disposition: A | Discharge status: Home / Self Care | Payer: Medicare HMO | Attending: Family Medicine | Admitting: Family Medicine

## 2020-05-12 ENCOUNTER — Other Ambulatory Visit: Payer: Self-pay

## 2020-05-12 ENCOUNTER — Emergency Department (HOSPITAL_COMMUNITY): Payer: Medicare HMO

## 2020-05-12 ENCOUNTER — Encounter (HOSPITAL_COMMUNITY): Payer: Self-pay | Admitting: Emergency Medicine

## 2020-05-12 ENCOUNTER — Observation Stay (HOSPITAL_COMMUNITY): Payer: Medicare HMO

## 2020-05-12 DIAGNOSIS — Z86711 Personal history of pulmonary embolism: Secondary | ICD-10-CM | POA: Diagnosis not present

## 2020-05-12 DIAGNOSIS — I509 Heart failure, unspecified: Secondary | ICD-10-CM

## 2020-05-12 DIAGNOSIS — I480 Paroxysmal atrial fibrillation: Secondary | ICD-10-CM | POA: Diagnosis present

## 2020-05-12 DIAGNOSIS — M797 Fibromyalgia: Secondary | ICD-10-CM | POA: Diagnosis present

## 2020-05-12 DIAGNOSIS — D509 Iron deficiency anemia, unspecified: Secondary | ICD-10-CM | POA: Diagnosis present

## 2020-05-12 DIAGNOSIS — I083 Combined rheumatic disorders of mitral, aortic and tricuspid valves: Secondary | ICD-10-CM | POA: Diagnosis present

## 2020-05-12 DIAGNOSIS — Z20822 Contact with and (suspected) exposure to covid-19: Secondary | ICD-10-CM | POA: Diagnosis present

## 2020-05-12 DIAGNOSIS — I517 Cardiomegaly: Secondary | ICD-10-CM | POA: Diagnosis not present

## 2020-05-12 DIAGNOSIS — I10 Essential (primary) hypertension: Secondary | ICD-10-CM | POA: Diagnosis not present

## 2020-05-12 DIAGNOSIS — Z951 Presence of aortocoronary bypass graft: Secondary | ICD-10-CM

## 2020-05-12 DIAGNOSIS — I5021 Acute systolic (congestive) heart failure: Secondary | ICD-10-CM | POA: Diagnosis not present

## 2020-05-12 DIAGNOSIS — I495 Sick sinus syndrome: Secondary | ICD-10-CM | POA: Diagnosis present

## 2020-05-12 DIAGNOSIS — E1122 Type 2 diabetes mellitus with diabetic chronic kidney disease: Secondary | ICD-10-CM | POA: Diagnosis present

## 2020-05-12 DIAGNOSIS — I2583 Coronary atherosclerosis due to lipid rich plaque: Secondary | ICD-10-CM | POA: Diagnosis not present

## 2020-05-12 DIAGNOSIS — Z79899 Other long term (current) drug therapy: Secondary | ICD-10-CM

## 2020-05-12 DIAGNOSIS — I429 Cardiomyopathy, unspecified: Secondary | ICD-10-CM | POA: Diagnosis present

## 2020-05-12 DIAGNOSIS — I130 Hypertensive heart and chronic kidney disease with heart failure and stage 1 through stage 4 chronic kidney disease, or unspecified chronic kidney disease: Principal | ICD-10-CM | POA: Diagnosis present

## 2020-05-12 DIAGNOSIS — J811 Chronic pulmonary edema: Secondary | ICD-10-CM | POA: Diagnosis not present

## 2020-05-12 DIAGNOSIS — I110 Hypertensive heart disease with heart failure: Secondary | ICD-10-CM | POA: Diagnosis not present

## 2020-05-12 DIAGNOSIS — Z7982 Long term (current) use of aspirin: Secondary | ICD-10-CM

## 2020-05-12 DIAGNOSIS — I252 Old myocardial infarction: Secondary | ICD-10-CM

## 2020-05-12 DIAGNOSIS — M5136 Other intervertebral disc degeneration, lumbar region: Secondary | ICD-10-CM | POA: Diagnosis present

## 2020-05-12 DIAGNOSIS — I2510 Atherosclerotic heart disease of native coronary artery without angina pectoris: Secondary | ICD-10-CM | POA: Diagnosis not present

## 2020-05-12 DIAGNOSIS — Z7901 Long term (current) use of anticoagulants: Secondary | ICD-10-CM

## 2020-05-12 DIAGNOSIS — Z85828 Personal history of other malignant neoplasm of skin: Secondary | ICD-10-CM

## 2020-05-12 DIAGNOSIS — K219 Gastro-esophageal reflux disease without esophagitis: Secondary | ICD-10-CM | POA: Diagnosis present

## 2020-05-12 DIAGNOSIS — R0902 Hypoxemia: Secondary | ICD-10-CM | POA: Diagnosis present

## 2020-05-12 DIAGNOSIS — J449 Chronic obstructive pulmonary disease, unspecified: Secondary | ICD-10-CM | POA: Diagnosis present

## 2020-05-12 DIAGNOSIS — I5043 Acute on chronic combined systolic (congestive) and diastolic (congestive) heart failure: Secondary | ICD-10-CM | POA: Diagnosis not present

## 2020-05-12 DIAGNOSIS — N1832 Chronic kidney disease, stage 3b: Secondary | ICD-10-CM | POA: Diagnosis present

## 2020-05-12 DIAGNOSIS — E785 Hyperlipidemia, unspecified: Secondary | ICD-10-CM | POA: Diagnosis not present

## 2020-05-12 DIAGNOSIS — I5023 Acute on chronic systolic (congestive) heart failure: Secondary | ICD-10-CM | POA: Diagnosis not present

## 2020-05-12 DIAGNOSIS — Z888 Allergy status to other drugs, medicaments and biological substances status: Secondary | ICD-10-CM

## 2020-05-12 DIAGNOSIS — Z8673 Personal history of transient ischemic attack (TIA), and cerebral infarction without residual deficits: Secondary | ICD-10-CM

## 2020-05-12 DIAGNOSIS — R042 Hemoptysis: Secondary | ICD-10-CM | POA: Diagnosis not present

## 2020-05-12 DIAGNOSIS — Z8249 Family history of ischemic heart disease and other diseases of the circulatory system: Secondary | ICD-10-CM

## 2020-05-12 DIAGNOSIS — Z7984 Long term (current) use of oral hypoglycemic drugs: Secondary | ICD-10-CM

## 2020-05-12 DIAGNOSIS — R0789 Other chest pain: Secondary | ICD-10-CM | POA: Diagnosis not present

## 2020-05-12 DIAGNOSIS — Z8546 Personal history of malignant neoplasm of prostate: Secondary | ICD-10-CM | POA: Diagnosis not present

## 2020-05-12 DIAGNOSIS — Z950 Presence of cardiac pacemaker: Secondary | ICD-10-CM

## 2020-05-12 DIAGNOSIS — R059 Cough, unspecified: Secondary | ICD-10-CM | POA: Diagnosis not present

## 2020-05-12 HISTORY — DX: Anemia, unspecified: D64.9

## 2020-05-12 HISTORY — DX: Chronic kidney disease, unspecified: N18.9

## 2020-05-12 HISTORY — DX: Pneumonia, unspecified organism: J18.9

## 2020-05-12 HISTORY — DX: Unspecified osteoarthritis, unspecified site: M19.90

## 2020-05-12 HISTORY — DX: Acute myocardial infarction, unspecified: I21.9

## 2020-05-12 HISTORY — DX: Chronic obstructive pulmonary disease, unspecified: J44.9

## 2020-05-12 HISTORY — DX: Fibromyalgia: M79.7

## 2020-05-12 HISTORY — DX: Presence of cardiac pacemaker: Z95.0

## 2020-05-12 LAB — CBC
HCT: 26.2 % — ABNORMAL LOW (ref 39.0–52.0)
Hemoglobin: 8.1 g/dL — ABNORMAL LOW (ref 13.0–17.0)
MCH: 31.6 pg (ref 26.0–34.0)
MCHC: 30.9 g/dL (ref 30.0–36.0)
MCV: 102.3 fL — ABNORMAL HIGH (ref 80.0–100.0)
Platelets: 285 10*3/uL (ref 150–400)
RBC: 2.56 MIL/uL — ABNORMAL LOW (ref 4.22–5.81)
RDW: 14.8 % (ref 11.5–15.5)
WBC: 10.7 10*3/uL — ABNORMAL HIGH (ref 4.0–10.5)
nRBC: 0 % (ref 0.0–0.2)

## 2020-05-12 LAB — BASIC METABOLIC PANEL
Anion gap: 8 (ref 5–15)
BUN: 21 mg/dL (ref 8–23)
CO2: 25 mmol/L (ref 22–32)
Calcium: 8.8 mg/dL — ABNORMAL LOW (ref 8.9–10.3)
Chloride: 105 mmol/L (ref 98–111)
Creatinine, Ser: 1.62 mg/dL — ABNORMAL HIGH (ref 0.61–1.24)
GFR, Estimated: 43 mL/min — ABNORMAL LOW (ref >60)
Glucose, Bld: 262 mg/dL — ABNORMAL HIGH (ref 70–99)
Potassium: 4.6 mmol/L (ref 3.5–5.1)
Sodium: 138 mmol/L (ref 135–145)

## 2020-05-12 LAB — ECHOCARDIOGRAM COMPLETE
AR max vel: 0.94 cm2
AV Area VTI: 0.91 cm2
AV Area mean vel: 0.94 cm2
AV Mean grad: 28 mmHg
AV Peak grad: 47.9 mmHg
Ao pk vel: 3.46 m/s
Area-P 1/2: 6.54 cm2
Height: 66 in
MV M vel: 5.77 m/s
MV Peak grad: 132.9 mmHg
Radius: 0.4 cm
S' Lateral: 4.9 cm
Weight: 1936 oz

## 2020-05-12 LAB — TROPONIN I (HIGH SENSITIVITY)
Troponin I (High Sensitivity): 37 ng/L — ABNORMAL HIGH (ref <18)
Troponin I (High Sensitivity): 37 ng/L — ABNORMAL HIGH (ref <18)

## 2020-05-12 LAB — RESPIRATORY PANEL BY RT PCR (FLU A&B, COVID)
Influenza A by PCR: NEGATIVE
Influenza B by PCR: NEGATIVE
SARS Coronavirus 2 by RT PCR: NEGATIVE

## 2020-05-12 LAB — BRAIN NATRIURETIC PEPTIDE: B Natriuretic Peptide: 1805.1 pg/mL — ABNORMAL HIGH (ref 0.0–100.0)

## 2020-05-12 LAB — GLUCOSE, CAPILLARY: Glucose-Capillary: 220 mg/dL — ABNORMAL HIGH (ref 70–99)

## 2020-05-12 MED ORDER — DILTIAZEM HCL ER COATED BEADS 240 MG PO CP24
240.0000 mg | ORAL_CAPSULE | Freq: Every day | ORAL | Status: DC
  Filled 2020-05-12: qty 1

## 2020-05-12 MED ORDER — SIMVASTATIN 20 MG PO TABS
10.0000 mg | ORAL_TABLET | Freq: Every day | ORAL | Status: DC
  Administered 2020-05-12: 10 mg via ORAL
  Filled 2020-05-12: qty 1

## 2020-05-12 MED ORDER — ASPIRIN EC 81 MG PO TBEC
81.0000 mg | DELAYED_RELEASE_TABLET | Freq: Every day | ORAL | Status: DC
  Administered 2020-05-13: 81 mg via ORAL
  Filled 2020-05-12: qty 1

## 2020-05-12 MED ORDER — PNEUMOCOCCAL VAC POLYVALENT 25 MCG/0.5ML IJ INJ
0.5000 mL | INJECTION | INTRAMUSCULAR | Status: DC
  Filled 2020-05-12: qty 0.5

## 2020-05-12 MED ORDER — BENAZEPRIL HCL 40 MG PO TABS: 40.0000 mg | ORAL_TABLET | Freq: Every day | ORAL | Status: DC

## 2020-05-12 MED ORDER — BENAZEPRIL HCL 20 MG PO TABS
20.0000 mg | ORAL_TABLET | Freq: Every day | ORAL | Status: DC
  Filled 2020-05-12: qty 1

## 2020-05-12 MED ORDER — FUROSEMIDE 10 MG/ML IJ SOLN
40.0000 mg | Freq: Once | INTRAMUSCULAR | Status: AC
  Administered 2020-05-12: 40 mg via INTRAVENOUS
  Filled 2020-05-12: qty 4

## 2020-05-12 MED ORDER — FUROSEMIDE 10 MG/ML IJ SOLN
40.0000 mg | Freq: Two times a day (BID) | INTRAMUSCULAR | Status: DC
  Administered 2020-05-12: 40 mg via INTRAVENOUS
  Filled 2020-05-12: qty 4

## 2020-05-12 MED ORDER — LINAGLIPTIN 5 MG PO TABS
5.0000 mg | ORAL_TABLET | Freq: Every day | ORAL | Status: DC
  Administered 2020-05-13 – 2020-05-14 (×2): 5 mg via ORAL
  Filled 2020-05-12 (×2): qty 1

## 2020-05-12 MED ORDER — AMIODARONE HCL 200 MG PO TABS
200.0000 mg | ORAL_TABLET | Freq: Every day | ORAL | Status: DC
  Administered 2020-05-13 – 2020-05-14 (×2): 200 mg via ORAL
  Filled 2020-05-12 (×2): qty 1

## 2020-05-12 MED ORDER — APIXABAN 5 MG PO TABS
5.0000 mg | ORAL_TABLET | Freq: Two times a day (BID) | ORAL | Status: DC
  Administered 2020-05-12 – 2020-05-13 (×2): 5 mg via ORAL
  Filled 2020-05-12 (×2): qty 1

## 2020-05-12 MED ORDER — GLIMEPIRIDE 4 MG PO TABS
4.0000 mg | ORAL_TABLET | Freq: Every day | ORAL | Status: DC
  Administered 2020-05-13 – 2020-05-14 (×2): 4 mg via ORAL
  Filled 2020-05-12 (×2): qty 1

## 2020-05-12 NOTE — H&P (Addendum)
[facility] Admission History and Physical Service Pager: [phone]  Patient name: Bryan Carney Medical record number: [id number] Date of birth: 1941-01-11 Age: 79 y.o. Gender: male  Primary Care Provider: [name], NP Consultants: None Code Status: Full  Preferred Emergency Contact: Daughter Bryan Carney, [phone], [phone]  Chief Complaint: 2 days of chest tightness and blood-tinged sputum  Assessment and Plan: Bryan Carney is a 79 y.o. male presenting with chest tightness x 2 d and blood tinged sputum.  Concern for acute heart failure exacerbation. PMH is significant for CAD s/p CABG, paroxysmal A. fib s/p pacemaker, HTN, T2DM, CKD stage III, aortic valve stenosis, tachy-brady syndrome, history of prostate cancer and squamous cell cancer of the scalp.   Acute heart failure exacerbation Two days of chest tightness and cough.  On physical exam, diffuse rhonchi in all fields and 2-word dyspnea.  SpO2 91% RA, increased to 97% on 3 L [place]. Chest x-ray demonstrates bilateral perihilar interstitial opacities suggestive of pulmonary edema.  BNP of 1805.1.  Troponin trending flat, 37 > 37.  Patient has known history of HFmrEF with most recent echocardiogram showing LVEF of 45-50%, mitral valve regurgitation is moderate, moderate thickening of aortic valve, moderate sclerosis of aortic valve, aortic valve regurgitation could not be assessed.  This echocardiogram was completed 03/04/2019.  This echo was stable from the previous echocardiogram in 2019.  Acute respiratory difficulty most likely related to heart failure exacerbation.  Other differentials include a pulmonary embolism.  Patient does have history of PE and is on anticoagulation with aspirin and Eliquis.  He has no signs or symptoms of a DVT.  His Wells PE score is 2.5. -Admit to med telemetry floor with Dr. Erin Hearing attending -Plan to diurese (will determine dose of IV Lasix to order after we see how  he responds to his 2 PM dose) -Echo ordered -Given extensive cardiac history, will consult cardiology tomorrow (or earlier if no improvement with diuresis or severely worsened echo) -Continuous telemetry -Continuous pulse ox -Vitals per unit routine -AM CBC, CMP -Strict I's and O's -Daily weights  Anemia of unknown etiology Hemoglobin 8.1 on admission with MCV 102.3.  Last recorded hemoglobin 9.8 on 01/04/2020. No obvious sources of bleeding, blood-tinged sputum reported in HPI very small amount.  No hemoptysis, hematuria, bright red blood per rectum, or dark tarry stools.  Concern for occult blood loss. Takes ferrous sulfate 325 mg twice daily at home.  Patient was recently seen by another provider who has referred him for gastroenterology follow-up and started him on Protonix.  This visit has not yet occurred. -Transfusion threshold Hgb 8 -Will continue Eliquis and aspirin given significant cardiac history and risk, but will monitor hemoglobin closely -Follow-up a.m. iron studies -Follow-up a.m. B12 and folate labs -Continue Protonix -Morning CBCs, if remains stable plan for outpatient follow-up  CAD s/p CABG  Paroxysmal A. fib s/p pacemaker  aortic valve stenosis  tachy-brady syndrome Home meds include Eliquis 5 mg, aspirin 81 mg, benazepril 40 mg, diltiazem 24-hour 240 mg.  No evidence of A. fib on physical exam or the 2-lead monitor at this time. -Will continue Eliquis and aspirin given significant cardiac history and risk, but will monitor hemoglobin closely -Admit to med telemetry floor with continuous telemetry  HTN Although history of hypertension, not currently on antihypertensive medication aside from cardiac meds benazepril and diltiazem as noted above. Admission BP (121-141)/(51-68), last 137/62.  Pulse 59-67, last 60. -Continue home meds -Continue to monitor -Vitals per  unit routine  T2DM Home meds include glimepiride 4 mg and Tradjenta 5 mg tablet daily.  Admission  glucose 262. -Continue home meds -No sliding scale insulin at this time, will reevaluate for need  CKD stage III  Baseline creatinine suspected to be 1.7.  Creatinine on admission 1.62.  Will diurese as noted above. -Monitor daily creatinine -Avoid nephrotoxic agents  Hyperlipidemia On simvastatin 10 mg daily at bedtime at home. -Continue home meds  GERD On Protonix 40 mg daily at home. -Decline to start inpatient at this time -Monitor for symptoms   FEN/GI: Heart healthy carb modified Prophylaxis: Eliquis and aspirin  Disposition: Med telemetry  History of Present Illness:  Bryan Carney is a 79 y.o. male presenting with chest tightness for the last 2 days and blood-tinged sputum.  Otherwise, has no complaints.  No malaise, muscle aches, fever, chills.  Patient also denies frank hemoptysis, hematuria, bright red blood per rectum, or dark tarry stools.  Patient notes he does not have much if any swelling of his hands or feet.  Patient has significant cardiac history including CAD s/p CABG, A. fib s/p pacemaker, and hypertension. Daughter is with him at bedside, very involved, and provides much of the past medical history.  Daughter reports 5-year history of "nagging cough" that has been extensively worked up by [employer] physicians without determination of etiology.  No known history of COPD.  Recent diagnosis of heart failure, was just recently put on a diuretic at that daughter reports is supposed be taken Friday, Saturday, and Sunday.  Daughter reports good response to that.  Review Of Systems: Per HPI with the following additions:   Review of Systems  Respiratory: Positive for cough, chest tightness and shortness of breath.   Cardiovascular: Negative for chest pain, palpitations and leg swelling.  Gastrointestinal: Negative for abdominal distention, blood in stool, constipation, diarrhea, nausea and vomiting.  Genitourinary: Negative for decreased urine volume and hematuria.   Neurological: Negative for dizziness, light-headedness and headaches.     Patient Active Problem List   Diagnosis Date Noted  . CHF exacerbation ([place]) 05/12/2020  . Tachy-brady syndrome ([place]) 02/27/2018  . Degeneration of lumbar intervertebral disc 12/31/2017  . Mild aortic stenosis 12/17/2017  . Low back pain 12/06/2017  . Aspiration pneumonia of left lower lobe due to gastric secretions ([place]) 03/29/2017  . Debility 03/27/2017  . Chest wall hematoma, right, sequela   . AKI (acute kidney injury) ([place])   . Leukocytosis   . Delirium   . Clot   . Hypoxia   . Lethargic   . Acute delirium   . Coronary artery disease involving native coronary artery of native heart without angina pectoris   . Paroxysmal atrial fibrillation (HCC)   . Coagulopathy ([place])   . Acute blood loss anemia 03/20/2017  . Hematoma 03/20/2017  . Hyponatremia 03/20/2017  . Supratherapeutic INR 03/20/2017  . Prostate cancer ([place]) 08/17/2014  . Essential hypertension 07/07/2014  . Hyperlipidemia 07/07/2014  . Coronary artery disease due to lipid rich plaque 07/07/2014  . Carotid artery disease ([place]) 07/07/2014  . Type 2 diabetes mellitus without complication, without long-term current use of insulin ([place]) 07/07/2014  . Long term (current) use of anticoagulants 10/14/2012  . Acute pulmonary embolism ([place]) 10/14/2012    Past Medical History: Past Medical History:  Diagnosis Date  . CAD (coronary artery disease)   . Diabetes ([place])   . Hx pulmonary embolism   . Hyperlipidemia   . Hypertension   . Prostate cancer (  [place])   . PVOD (pulmonary veno-occlusive disease) ([place])   . Squamous cell carcinoma     Past Surgical History: Past Surgical History:  Procedure Laterality Date  . BACK SURGERY    . CARDIAC CATHETERIZATION  10/15/1997   Recommend complete revascularization by CABG  . CARDIOVASCULAR STRESS TEST  06/12/2012   No evidence of ischemia  . CAROTID DOPPLER  08/27/2012   Rt bulb/proximal ICA  demonstrated a mild-moderate amount of fibrous plaque without evidence of a significant diameter reduction or any other vascular abnormality.  . CAROTID ENDARTERECTOMY Left 02/23/1995  . CORONARY ARTERY BYPASS GRAFT  10/19/1997   x5. LIMA-LAD, SVG-PDA, SVG-OM1, seq SVG-fist and second branches of ramus intermedius.  Marland Kitchen PACEMAKER IMPLANT N/A 02/27/2018   Procedure: PACEMAKER IMPLANT;  Surgeon: Constance Haw, MD;  Location: [facility];  Service: Cardiovascular;  Laterality: N/A;    Social History: Social History   Tobacco Use  . Smoking status: Never Smoker  . Smokeless tobacco: Never Used  Vaping Use  . Vaping Use: Never used  Substance Use Topics  . Alcohol use: No  . Drug use: Not on file   Additional social history: Denies smoking, alcohol, drugs. Please also refer to relevant sections of EMR.  Family History: Family History  Problem Relation Age of Onset  . Heart attack Father   . Cancer Sister   . Diabetes Brother   . Cancer Sister   . Diabetes Brother   . Diabetes Brother   . Diabetes Brother     Allergies and Medications: Allergies  Allergen Reactions  . Lopressor [Metoprolol Tartrate] Other (See Comments)    Severe BP drop  . Lipitor [Atorvastatin]     Muscle aches   No current facility-administered medications on file prior to encounter.   Current Outpatient Medications on File Prior to Encounter  Medication Sig Dispense Refill  . amiodarone (PACERONE) 200 MG tablet TAKE ONE TABLET BY MOUTH DAILY--please make overdue follow up appt for further refills [phone] 1st attempt (Patient taking differently: 200 mg. TAKE ONE TABLET BY MOUTH DAILY--please make overdue follow up appt for further refills [phone] 1st attempt) 90 tablet 3  . apixaban (ELIQUIS) 5 MG TABS tablet Take 1 tablet (5 mg total) by mouth 2 (two) times daily. 60 tablet 0  . aspirin EC 81 MG tablet Take 81 mg by mouth daily.    . benazepril (LOTENSIN) 40 MG tablet Take 1 tablet  (40 mg total) by mouth daily. 30 tablet 0  . cetirizine (ZYRTEC) 10 MG tablet Take 10 mg by mouth daily.    Marland Kitchen diltiazem (CARDIZEM CD) 240 MG 24 hr capsule Take 1 capsule (240 mg total) by mouth daily. 90 capsule 3  . ELDERBERRY PO Take 300 mg by mouth daily.    . ferrous sulfate 325 (65 FE) MG tablet Take 325 mg by mouth 2 (two) times daily with a meal.     . glimepiride (AMARYL) 4 MG tablet Take 1 tablet (4 mg total) by mouth daily with breakfast. 30 tablet 0  . linagliptin (TRADJENTA) 5 MG TABS tablet Take 1 tablet (5 mg total) by mouth daily. 30 tablet 0  . pantoprazole (PROTONIX) 40 MG tablet Take 40 mg by mouth daily.    . simvastatin (ZOCOR) 20 MG tablet Take 0.5 tablets (10 mg total) by mouth at bedtime. (Patient taking differently: Take 20 mg by mouth daily. ) 30 tablet 0    Objective: BP 137/62 (BP Location: Right Arm)   Pulse  60   Temp 98.6 F (37 C) (Oral)   Resp 19   Ht 5\' 6"  (1.676 m)   Wt 54.9 kg   SpO2 97%   BMI 19.53 kg/m   Exam: General: Thin, frail elderly man in no acute distress, hard of hearing Eyes: Pupils equal, EOM intact Cardiovascular: S1 crescendo-decrescendo murmur heard at aortic and tricuspid valve positions (congruent with known aortic valve stenosis), bradycardic around 60 bpm, regular rhythm Respiratory: Diffuse inspiratory and expiratory rhonchi in all lung fields Gastrointestinal: Bowel sounds present, no TTP MSK: No musculoskeletal tenderness, moving all extremities equally Derm: No lesions or rashes Neuro: Cranial nerves II through X grossly intact, moving all extremities spontaneously Psych: Oriented to person place and time, appears to have normal judgment and insight  Labs and Imaging: CBC BMET  Recent Labs  Lab 05/12/20 1006  WBC 10.7*  HGB 8.1*  HCT 26.2*  PLT 285   Recent Labs  Lab 05/12/20 1006  NA 138  K 4.6  CL 105  CO2 25  BUN 21  CREATININE 1.62*  GLUCOSE 262*  CALCIUM 8.8*     EKG: atrial-paced rhythm without ST  elevation   CXR 2 view 05/12/2020 IMPRESSION: Progressive bilateral perihilar interstitial opacities, with appearance favoring worsening pulmonary edema over atypical/viral infection.  Ezequiel Essex, MD 05/12/2020, 4:09 PM PGY-1, [place] Intern pager: [phone], text pages welcome  FPTS Upper-Level Resident Addendum   I have independently interviewed and examined the patient. I have discussed the above with the original author and agree with their documentation. My edits for correction/addition/clarification are in blue.Please see also any attending notes.   Gifford Shave, MD PGY-2, [facility] 05/12/2020 4:54 PM  [place] Service pager: [phone] (text pages welcome through [place])

## 2020-05-12 NOTE — ED Provider Notes (Signed)
[facility] EMERGENCY DEPARTMENT Provider Note   CSN: [id number] Arrival date & time: 05/12/20  0946     History Chief Complaint  Patient presents with  . Chest Pain    Bryan Carney is a 79 y.o. male.  HPI 79 year old male presents with hemoptysis and chest tightness.  Has a chronic cough.  Yesterday had some chest tightness and again this morning.  No dyspnea.  Coughed up blood twice.  He was recently prescribed Protonix for a small drop in his hemoglobin (down to 8.5 on 11/12) and was referred to gastroenterology.  He is currently on Eliquis.  Right now he feels fine.  No leg swelling.  Had an x-ray a couple days ago that showed pulmonary edema and thinks was put on diuretic.   Past Medical History:  Diagnosis Date  . CAD (coronary artery disease)   . Diabetes ([place])   . Hx pulmonary embolism   . Hyperlipidemia   . Hypertension   . Prostate cancer ([place])   . PVOD (pulmonary veno-occlusive disease) ([place])   . Squamous cell carcinoma     Patient Active Problem List   Diagnosis Date Noted  . CHF exacerbation ([place]) 05/12/2020  . Tachy-brady syndrome ([place]) 02/27/2018  . Degeneration of lumbar intervertebral disc 12/31/2017  . Mild aortic stenosis 12/17/2017  . Low back pain 12/06/2017  . Aspiration pneumonia of left lower lobe due to gastric secretions ([place]) 03/29/2017  . Debility 03/27/2017  . Chest wall hematoma, right, sequela   . AKI (acute kidney injury) ([place])   . Leukocytosis   . Delirium   . Clot   . Hypoxia   . Lethargic   . Acute delirium   . Coronary artery disease involving native coronary artery of native heart without angina pectoris   . Paroxysmal atrial fibrillation (HCC)   . Coagulopathy ([place])   . Acute blood loss anemia 03/20/2017  . Hematoma 03/20/2017  . Hyponatremia 03/20/2017  . Supratherapeutic INR 03/20/2017  . Prostate cancer ([place]) 08/17/2014  . Essential hypertension 07/07/2014  . Hyperlipidemia 07/07/2014  . Coronary  artery disease due to lipid rich plaque 07/07/2014  . Carotid artery disease ([place]) 07/07/2014  . Type 2 diabetes mellitus without complication, without long-term current use of insulin ([place]) 07/07/2014  . Long term (current) use of anticoagulants 10/14/2012  . Acute pulmonary embolism ([place]) 10/14/2012    Past Surgical History:  Procedure Laterality Date  . BACK SURGERY    . CARDIAC CATHETERIZATION  10/15/1997   Recommend complete revascularization by CABG  . CARDIOVASCULAR STRESS TEST  06/12/2012   No evidence of ischemia  . CAROTID DOPPLER  08/27/2012   Rt bulb/proximal ICA demonstrated a mild-moderate amount of fibrous plaque without evidence of a significant diameter reduction or any other vascular abnormality.  . CAROTID ENDARTERECTOMY Left 02/23/1995  . CORONARY ARTERY BYPASS GRAFT  10/19/1997   x5. LIMA-LAD, SVG-PDA, SVG-OM1, seq SVG-fist and second branches of ramus intermedius.  Marland Kitchen PACEMAKER IMPLANT N/A 02/27/2018   Procedure: PACEMAKER IMPLANT;  Surgeon: Constance Haw, MD;  Location: [facility];  Service: Cardiovascular;  Laterality: N/A;       Family History  Problem Relation Age of Onset  . Heart attack Father   . Cancer Sister   . Diabetes Brother   . Cancer Sister   . Diabetes Brother   . Diabetes Brother   . Diabetes Brother     Social History   Tobacco Use  . Smoking status: Never Smoker  .  Smokeless tobacco: Never Used  Vaping Use  . Vaping Use: Never used  Substance Use Topics  . Alcohol use: No  . Drug use: Not on file    Home Medications Prior to Admission medications   Medication Sig Start Date End Date Taking? Authorizing Provider  amiodarone (PACERONE) 200 MG tablet TAKE ONE TABLET BY MOUTH DAILY--please make overdue follow up appt for further refills [phone] 1st attempt Patient taking differently: 200 mg. TAKE ONE TABLET BY MOUTH DAILY--please make overdue follow up appt for further refills [phone] 1st attempt 11/02/19  Yes  Camnitz, Ocie Doyne, MD  apixaban (ELIQUIS) 5 MG TABS tablet Take 1 tablet (5 mg total) by mouth 2 (two) times daily. 04/05/17  Yes Love, Ivan Anchors, PA-C  aspirin EC 81 MG tablet Take 81 mg by mouth daily.   Yes [provider]  benazepril (LOTENSIN) 40 MG tablet Take 1 tablet (40 mg total) by mouth daily. 04/05/17  Yes Love, Ivan Anchors, PA-C  cetirizine (ZYRTEC) 10 MG tablet Take 10 mg by mouth daily.   Yes [provider]  diltiazem (CARDIZEM CD) 240 MG 24 hr capsule Take 1 capsule (240 mg total) by mouth daily. 03/08/20  Yes Camnitz, Will Hassell Done, MD  ELDERBERRY PO Take 300 mg by mouth daily.   Yes [provider]  ferrous sulfate 325 (65 FE) MG tablet Take 325 mg by mouth 2 (two) times daily with a meal.    Yes [provider]  glimepiride (AMARYL) 4 MG tablet Take 1 tablet (4 mg total) by mouth daily with breakfast. 04/06/17  Yes Love, Ivan Anchors, PA-C  linagliptin (TRADJENTA) 5 MG TABS tablet Take 1 tablet (5 mg total) by mouth daily. 04/06/17  Yes Love, Ivan Anchors, PA-C  pantoprazole (PROTONIX) 40 MG tablet Take 40 mg by mouth daily. 05/07/20  Yes [provider]  simvastatin (ZOCOR) 20 MG tablet Take 0.5 tablets (10 mg total) by mouth at bedtime. Patient taking differently: Take 20 mg by mouth daily.  04/05/17  Yes Love, Ivan Anchors, PA-C    Allergies    Lopressor [metoprolol tartrate] and Lipitor [atorvastatin]  Review of Systems   Review of Systems  Constitutional: Negative for fever.  Respiratory: Positive for cough, chest tightness and shortness of breath.   Cardiovascular: Negative for leg swelling.  All other systems reviewed and are negative.   Physical Exam Updated Vital Signs BP 137/62 (BP Location: Right Arm)   Pulse 60   Temp 98.6 F (37 C) (Oral)   Resp 19   Ht 5\' 6"  (1.676 m)   Wt 54.9 kg   SpO2 97%   BMI 19.53 kg/m   Physical Exam Vitals and nursing note reviewed.  Constitutional:      General: He is not in acute  distress.    Appearance: He is well-developed. He is not ill-appearing or diaphoretic.  HENT:     Head: Normocephalic and atraumatic.     Right Ear: External ear normal.     Left Ear: External ear normal.     Nose: Nose normal.  Eyes:     General:        Right eye: No discharge.        Left eye: No discharge.  Neck:     Vascular: JVD present.  Cardiovascular:     Rate and Rhythm: Normal rate and regular rhythm.     Heart sounds: Murmur heard.   Pulmonary:     Effort: Pulmonary effort is normal. Tachypnea present.  No accessory muscle usage or respiratory distress.     Breath sounds: Examination of the right-middle field reveals rales. Examination of the left-middle field reveals rales. Examination of the right-lower field reveals rales. Examination of the left-lower field reveals rales. Rales present.  Abdominal:     Palpations: Abdomen is soft.     Tenderness: There is no abdominal tenderness.  Musculoskeletal:     Cervical back: Neck supple.     Right lower leg: No edema.     Left lower leg: No edema.  Skin:    General: Skin is warm and dry.  Neurological:     Mental Status: He is alert.  Psychiatric:        Mood and Affect: Mood is not anxious.     ED Results / Procedures / Treatments   Labs (all labs ordered are listed, but only abnormal results are displayed) Labs Reviewed  BASIC METABOLIC PANEL - Abnormal; Notable for the following components:      Result Value   Glucose, Bld 262 (*)    Creatinine, Ser 1.62 (*)    Calcium 8.8 (*)    GFR, Estimated 43 (*)    All other components within normal limits  CBC - Abnormal; Notable for the following components:   WBC 10.7 (*)    RBC 2.56 (*)    Hemoglobin 8.1 (*)    HCT 26.2 (*)    MCV 102.3 (*)    All other components within normal limits  BRAIN NATRIURETIC PEPTIDE - Abnormal; Notable for the following components:   B Natriuretic Peptide 1,805.1 (*)    All other components within normal limits  TROPONIN I (HIGH  SENSITIVITY) - Abnormal; Notable for the following components:   Troponin I (High Sensitivity) 37 (*)    All other components within normal limits  TROPONIN I (HIGH SENSITIVITY) - Abnormal; Notable for the following components:   Troponin I (High Sensitivity) 37 (*)    All other components within normal limits  RESPIRATORY PANEL BY RT PCR (FLU A&B, COVID)    EKG EKG Interpretation  Date/Time:  Thursday May 12 2020 09:45:06 EST Ventricular Rate:  64 PR Interval:  244 QRS Duration: 102 QT Interval:  426 QTC Calculation: 439 R Axis:   98 Text Interpretation: Atrial-paced rhythm with prolonged AV conduction Rightward axis Incomplete right bundle branch block ST & T wave abnormality, consider lateral ischemia Abnormal ECG similar to 2018 and 2019 Confirmed by Sherwood Gambler [phone]) on 05/12/2020 11:13:43 AM   Radiology DG Chest 2 View  Result Date: 05/12/2020 CLINICAL DATA:  Chest tightness EXAM: CHEST - 2 VIEW COMPARISON:  05/10/2020 FINDINGS: Stable positioning of left-sided implanted cardiac device. Post CABG changes. Stable cardiomegaly. Atherosclerotic calcification of the aortic knob. Progressive bilateral perihilar interstitial opacities. Lung fields are hyperexpanded with flattening of the diaphragms. No pleural effusion or pneumothorax. IMPRESSION: Progressive bilateral perihilar interstitial opacities, with appearance favoring worsening pulmonary edema over atypical/viral infection. Electronically Signed   By: Davina Poke D.O.   On: 05/12/2020 10:36    Procedures Procedures (including critical care time)  Medications Ordered in ED Medications  amiodarone (PACERONE) tablet 200 mg (has no administration in time range)  benazepril (LOTENSIN) tablet 40 mg (has no administration in time range)  diltiazem (CARDIZEM CD) 24 hr capsule 240 mg (has no administration in time range)  simvastatin (ZOCOR) tablet 10 mg (has no administration in time range)  apixaban (ELIQUIS)  tablet 5 mg (has no administration in time range)  aspirin EC  tablet 81 mg (has no administration in time range)  glimepiride (AMARYL) tablet 4 mg (has no administration in time range)  linagliptin (TRADJENTA) tablet 5 mg (has no administration in time range)  furosemide (LASIX) injection 40 mg (40 mg Intravenous Given 05/12/20 1353)    ED Course  I have reviewed the triage vital signs and the nursing notes.  Pertinent labs & imaging results that were available during my care of the patient were reviewed by me and considered in my medical decision making (see chart for details).  Clinical Course as of May 12 1541  Thu May 12, 2020  1118 O2 sats are frequently dropping into the 80s and at one point 108. Comes up frequently but given repeated hypoxia, will place on 2L.   [SG]    Clinical Course User Index [SG] Sherwood Gambler, MD   MDM Rules/Calculators/A&P                          Patient appears to have acute CHF exacerbation.  Given his hypoxia he was placed on oxygen and will need admission for diuresis.  I have ordered IV Lasix.  Otherwise, the patient's troponins are mildly elevated but this seems consistent with CHF rather than ACS/ischemia.  Hemoptysis is likely from the pulmonary edema in combination with his blood thinner.  However at this point he is not continuing to have hemoptysis and is otherwise stable.  I do not think he needs emergent CT or that he is currently having massive hemoptysis.  Will admit to family practice. Final Clinical Impression(s) / ED Diagnoses Final diagnoses:  Acute on chronic systolic congestive heart failure [facility])    Rx / DC Orders ED Discharge Orders    None       Sherwood Gambler, MD 05/12/20 1544

## 2020-05-12 NOTE — Progress Notes (Signed)
Reviewed echo results that returned overnight.  Compared to his last echo in 02/2019, he has had further reduction in his EF and progression of valvular disease.    EF now 30-35% (previously 45-50) with severe aortic stenosis (mild in 2020), mitral regurgitation, and tricuspid regurgitation.  Restrictive grade 3 diastolic dysfunction with elevated pulmonary pressures.  Will avoid any further diuresis overnight and not scheduled for any antihypertensives.  Currently hemodynamically stable.  Will consult cardiology in the morning.   Patriciaann Clan, DO

## 2020-05-12 NOTE — ED Triage Notes (Signed)
Patient arrives to ED with complaints of x2 days of intermittent centralzied chest tightness. Denies SOB and referred pain. Denies CP at this current moment. Pt states cough this morning that had blood tinged sputum.

## 2020-05-12 NOTE — ED Notes (Signed)
Report called.Marland Kitchen ECHO is at bedside doing an ECHO

## 2020-05-12 NOTE — Progress Notes (Signed)
  Echocardiogram 2D Echocardiogram has been performed.  Michiel Cowboy 05/12/2020, 3:26 PM

## 2020-05-12 NOTE — Plan of Care (Signed)

## 2020-05-12 NOTE — Plan of Care (Signed)
?  Problem: Elimination: ?Goal: Will not experience complications related to urinary retention ?Outcome: Progressing ?  ?

## 2020-05-12 NOTE — Plan of Care (Signed)
  Problem: Education: Goal: Ability to demonstrate management of disease process will improve 05/12/2020 1726 by Arlina Robes, RN Outcome: Progressing 05/12/2020 1725 by Arlina Robes, RN Outcome: Progressing Goal: Ability to verbalize understanding of medication therapies will improve 05/12/2020 1726 by Arlina Robes, RN Outcome: Progressing 05/12/2020 1725 by Arlina Robes, RN Outcome: Progressing Goal: Individualized Educational Video(s) 05/12/2020 1726 by Arlina Robes, RN Outcome: Progressing 05/12/2020 1725 by Arlina Robes, RN Outcome: Progressing   Problem: Activity: Goal: Capacity to carry out activities will improve 05/12/2020 1726 by Arlina Robes, RN Outcome: Progressing 05/12/2020 1725 by Arlina Robes, RN Outcome: Progressing   Problem: Cardiac: Goal: Ability to achieve and maintain adequate cardiopulmonary perfusion will improve 05/12/2020 1726 by Arlina Robes, RN Outcome: Progressing 05/12/2020 1725 by Arlina Robes, RN Outcome: Progressing   Problem: Education: Goal: Knowledge of General Education information will improve Description: Including pain rating scale, medication(s)/side effects and non-pharmacologic comfort measures Outcome: Progressing   Problem: Health Behavior/Discharge Planning: Goal: Ability to manage health-related needs will improve Outcome: Progressing   Problem: Clinical Measurements: Goal: Ability to maintain clinical measurements within normal limits will improve Outcome: Progressing Goal: Will remain free from infection Outcome: Progressing Goal: Diagnostic test results will improve Outcome: Progressing Goal: Respiratory complications will improve Outcome: Progressing Goal: Cardiovascular complication will be avoided Outcome: Progressing   Problem: Activity: Goal: Risk for activity intolerance will decrease Outcome: Progressing   Problem: Nutrition: Goal: Adequate  nutrition will be maintained Outcome: Progressing   Problem: Coping: Goal: Level of anxiety will decrease Outcome: Progressing   Problem: Elimination: Goal: Will not experience complications related to bowel motility Outcome: Progressing Goal: Will not experience complications related to urinary retention Outcome: Progressing   Problem: Pain Managment: Goal: General experience of comfort will improve Outcome: Progressing   Problem: Safety: Goal: Ability to remain free from injury will improve Outcome: Progressing   Problem: Skin Integrity: Goal: Risk for impaired skin integrity will decrease Outcome: Progressing

## 2020-05-13 DIAGNOSIS — I5043 Acute on chronic combined systolic (congestive) and diastolic (congestive) heart failure: Secondary | ICD-10-CM | POA: Diagnosis present

## 2020-05-13 DIAGNOSIS — Z86711 Personal history of pulmonary embolism: Secondary | ICD-10-CM | POA: Diagnosis not present

## 2020-05-13 DIAGNOSIS — M797 Fibromyalgia: Secondary | ICD-10-CM | POA: Diagnosis present

## 2020-05-13 DIAGNOSIS — I5023 Acute on chronic systolic (congestive) heart failure: Secondary | ICD-10-CM | POA: Diagnosis not present

## 2020-05-13 DIAGNOSIS — I083 Combined rheumatic disorders of mitral, aortic and tricuspid valves: Secondary | ICD-10-CM | POA: Diagnosis present

## 2020-05-13 DIAGNOSIS — Z20822 Contact with and (suspected) exposure to covid-19: Secondary | ICD-10-CM | POA: Diagnosis present

## 2020-05-13 DIAGNOSIS — I509 Heart failure, unspecified: Secondary | ICD-10-CM | POA: Diagnosis present

## 2020-05-13 DIAGNOSIS — I2510 Atherosclerotic heart disease of native coronary artery without angina pectoris: Secondary | ICD-10-CM | POA: Diagnosis present

## 2020-05-13 DIAGNOSIS — E1122 Type 2 diabetes mellitus with diabetic chronic kidney disease: Secondary | ICD-10-CM | POA: Diagnosis present

## 2020-05-13 DIAGNOSIS — I2583 Coronary atherosclerosis due to lipid rich plaque: Secondary | ICD-10-CM | POA: Diagnosis present

## 2020-05-13 DIAGNOSIS — Z8546 Personal history of malignant neoplasm of prostate: Secondary | ICD-10-CM | POA: Diagnosis not present

## 2020-05-13 DIAGNOSIS — I495 Sick sinus syndrome: Secondary | ICD-10-CM | POA: Diagnosis present

## 2020-05-13 DIAGNOSIS — Z7901 Long term (current) use of anticoagulants: Secondary | ICD-10-CM | POA: Diagnosis not present

## 2020-05-13 DIAGNOSIS — M5136 Other intervertebral disc degeneration, lumbar region: Secondary | ICD-10-CM | POA: Diagnosis present

## 2020-05-13 DIAGNOSIS — K219 Gastro-esophageal reflux disease without esophagitis: Secondary | ICD-10-CM | POA: Diagnosis present

## 2020-05-13 DIAGNOSIS — R0902 Hypoxemia: Secondary | ICD-10-CM | POA: Diagnosis present

## 2020-05-13 DIAGNOSIS — D509 Iron deficiency anemia, unspecified: Secondary | ICD-10-CM | POA: Diagnosis present

## 2020-05-13 DIAGNOSIS — J449 Chronic obstructive pulmonary disease, unspecified: Secondary | ICD-10-CM | POA: Diagnosis present

## 2020-05-13 DIAGNOSIS — R042 Hemoptysis: Secondary | ICD-10-CM | POA: Diagnosis present

## 2020-05-13 DIAGNOSIS — I480 Paroxysmal atrial fibrillation: Secondary | ICD-10-CM | POA: Diagnosis present

## 2020-05-13 DIAGNOSIS — N1832 Chronic kidney disease, stage 3b: Secondary | ICD-10-CM | POA: Diagnosis present

## 2020-05-13 DIAGNOSIS — I130 Hypertensive heart and chronic kidney disease with heart failure and stage 1 through stage 4 chronic kidney disease, or unspecified chronic kidney disease: Secondary | ICD-10-CM | POA: Diagnosis present

## 2020-05-13 DIAGNOSIS — Z951 Presence of aortocoronary bypass graft: Secondary | ICD-10-CM | POA: Diagnosis not present

## 2020-05-13 DIAGNOSIS — E785 Hyperlipidemia, unspecified: Secondary | ICD-10-CM | POA: Diagnosis present

## 2020-05-13 DIAGNOSIS — Z85828 Personal history of other malignant neoplasm of skin: Secondary | ICD-10-CM | POA: Diagnosis not present

## 2020-05-13 DIAGNOSIS — I429 Cardiomyopathy, unspecified: Secondary | ICD-10-CM | POA: Diagnosis present

## 2020-05-13 LAB — GLUCOSE, CAPILLARY
Glucose-Capillary: 166 mg/dL — ABNORMAL HIGH (ref 70–99)
Glucose-Capillary: 97 mg/dL (ref 70–99)
Glucose-Capillary: 114 mg/dL — ABNORMAL HIGH (ref 70–99)
Glucose-Capillary: 94 mg/dL (ref 70–99)
Glucose-Capillary: 113 mg/dL — ABNORMAL HIGH (ref 70–99)
Glucose-Capillary: 68 mg/dL — ABNORMAL LOW (ref 70–99)

## 2020-05-13 LAB — CBC
HCT: 25.4 % — ABNORMAL LOW (ref 39.0–52.0)
Hemoglobin: 8.3 g/dL — ABNORMAL LOW (ref 13.0–17.0)
MCH: 32.3 pg (ref 26.0–34.0)
MCHC: 32.7 g/dL (ref 30.0–36.0)
MCV: 98.8 fL (ref 80.0–100.0)
Platelets: 285 10*3/uL (ref 150–400)
RBC: 2.57 MIL/uL — ABNORMAL LOW (ref 4.22–5.81)
RDW: 14.7 % (ref 11.5–15.5)
WBC: 9.9 10*3/uL (ref 4.0–10.5)
nRBC: 0 % (ref 0.0–0.2)

## 2020-05-13 LAB — BASIC METABOLIC PANEL
Anion gap: 9 (ref 5–15)
BUN: 20 mg/dL (ref 8–23)
CO2: 27 mmol/L (ref 22–32)
Calcium: 8.8 mg/dL — ABNORMAL LOW (ref 8.9–10.3)
Chloride: 102 mmol/L (ref 98–111)
Creatinine, Ser: 1.69 mg/dL — ABNORMAL HIGH (ref 0.61–1.24)
GFR, Estimated: 41 mL/min — ABNORMAL LOW (ref >60)
Glucose, Bld: 159 mg/dL — ABNORMAL HIGH (ref 70–99)
Potassium: 3.6 mmol/L (ref 3.5–5.1)
Sodium: 138 mmol/L (ref 135–145)

## 2020-05-13 LAB — FOLATE: Folate: 20.7 ng/mL (ref >5.9)

## 2020-05-13 LAB — VITAMIN B12: Vitamin B-12: 337 pg/mL (ref 180–914)

## 2020-05-13 LAB — FERRITIN: Ferritin: 149 ng/mL (ref 24–336)

## 2020-05-13 LAB — IRON AND TIBC
Iron: 32 ug/dL — ABNORMAL LOW (ref 45–182)
Saturation Ratios: 10 % — ABNORMAL LOW (ref 17.9–39.5)
TIBC: 330 ug/dL (ref 250–450)
UIBC: 298 ug/dL

## 2020-05-13 LAB — SAVE SMEAR(SSMR), FOR PROVIDER SLIDE REVIEW

## 2020-05-13 MED ORDER — PANTOPRAZOLE SODIUM 40 MG PO TBEC
40.0000 mg | DELAYED_RELEASE_TABLET | Freq: Two times a day (BID) | ORAL | Status: DC
  Administered 2020-05-13 – 2020-05-14 (×3): 40 mg via ORAL
  Filled 2020-05-13 (×3): qty 1

## 2020-05-13 MED ORDER — APIXABAN 2.5 MG PO TABS
2.5000 mg | ORAL_TABLET | Freq: Two times a day (BID) | ORAL | Status: DC
  Administered 2020-05-13 – 2020-05-14 (×2): 2.5 mg via ORAL
  Filled 2020-05-13 (×2): qty 1

## 2020-05-13 MED ORDER — ROSUVASTATIN CALCIUM 5 MG PO TABS
10.0000 mg | ORAL_TABLET | Freq: Every day | ORAL | Status: DC
  Administered 2020-05-14: 10 mg via ORAL
  Filled 2020-05-13: qty 2

## 2020-05-13 MED ORDER — LOSARTAN POTASSIUM 50 MG PO TABS: 50.0000 mg | ORAL_TABLET | Freq: Every day | ORAL | Status: DC

## 2020-05-13 MED ORDER — LOSARTAN POTASSIUM 25 MG PO TABS
25.0000 mg | ORAL_TABLET | Freq: Every day | ORAL | Status: DC
  Administered 2020-05-14: 25 mg via ORAL
  Filled 2020-05-13: qty 1

## 2020-05-13 MED ORDER — METOPROLOL SUCCINATE ER 25 MG PO TB24
12.5000 mg | ORAL_TABLET | Freq: Every day | ORAL | Status: DC
  Filled 2020-05-13: qty 1

## 2020-05-13 MED ORDER — FUROSEMIDE 10 MG/ML IJ SOLN
20.0000 mg | Freq: Two times a day (BID) | INTRAMUSCULAR | Status: DC
  Administered 2020-05-13 – 2020-05-14 (×3): 20 mg via INTRAVENOUS
  Filled 2020-05-13 (×3): qty 2

## 2020-05-13 MED ORDER — ROSUVASTATIN CALCIUM 20 MG PO TABS
20.0000 mg | ORAL_TABLET | Freq: Every day | ORAL | Status: DC
  Administered 2020-05-13: 20 mg via ORAL
  Filled 2020-05-13: qty 1

## 2020-05-13 NOTE — Hospital Course (Addendum)
Bryan Carney is an 79 y.o. male who presented with chest tightness and blood tinged sputum concerning for acute heart failure exacerbation. PMH is significant for CAD s/p CABG, paroxysmal A. fib s/p pacemaker, HTN, T2DM, CKD stage III, aortic valve stenosis, tachy-brady syndrome, history of prostate cancer and squamous cell cancer of the scalp.   Acute CHF Exacerbation  Severe valvular disease  CAD s/p CABG  Paroxysmal A-fib s/p pacemaker Bryan Carney presented to ED with complaint of 2-day history of chest tightness and cough.  Physical exam revealed to her dyspnea, SPO2 91% on room air that increased to 97% on 3 L nasal cannula.  Chest x-ray suggestive of pulmonary edema.  BNP of 1805.1.  Troponins negative x2.  Differential included acute heart failure exacerbation and pulmonary embolism.  Despite personal history of PE, PE less likely due to being on full anticoagulation with aspirin and Eliquis, no signs or symptoms of DVT, and well score of 2.5.  He was given initial diuresis with IV Lasix in ED and was admitted.  Echo on 11/18 demonstrated LVEF 30 to 35%, LV global hypokinesis, grade 3 diastolic dysfunction, severely elevated pulmonary artery systolic pressure, severe regurg of mitral and tricuspid valves, also severe calcification of aortic valve. Cardiology was consulted and recommended gentle diuresis.  Net -3,745 mL fluid reduction during admission. Symptom resolution and physical exam improvement after diuresis. Close cardiology follow up recommended. Cardiology also made changes as noted below: -Simvastatin changed to Crestor 10 mg once daily -Eliquis dose reduced to 2.5 mg twice daily given age of 65 years and less than 60 kg -Started Lasix 20 mg p.o. daily -ARB switched to losartan 25 mg  -Cardizem was discontinued  -Metoprolol was started; however, family noted past allergy to this medication so discontinued   Macrocytic Anemia Partially worked up in the hospital: B12 and folate, TSH wnl.  Patient denies ETOH use. Reticulocyte count 82, retic ct percent 2.3, IRF 2.66. Smear review is currently pending.   AKI on CKD Stage III Creatinine baseline believed to be 1.7.  Creatinine 1.69 upon admission, rose to 2.15 during admission while being diuresed.  Diuresis decreased after symptom and physical exam improvement.  Discharged on Lasix 20 mg p.o. daily.  Other chronic conditions stable during this admission: Hypertension Type 2 diabetes mellitus Hyperlipidemia GERD

## 2020-05-13 NOTE — Progress Notes (Addendum)
Family Medicine Teaching Service Daily Progress Note Intern Pager: [phone]  Patient name: Bryan Carney Medical record number: [id number] Date of birth: [birth year] Age: 79 y.o. Gender: male  Primary Care Provider: [name], NP Consultants: Cardiology Code Status: Full  Pt Overview and Major Events to Date:  Admitted 11/18  Assessment and Plan: Bryan Carney is a 79 y.o. male presenting with chest tightness x 2 d and blood tinged sputum.  Concern for acute heart failure exacerbation. PMH is significant for CAD s/p CABG, paroxysmal A. fib s/p pacemaker, HTN, T2DM, CKD stage III, aortic valve stenosis, tachy-brady syndrome, history of prostate cancer and squamous cell cancer of the scalp.   Acute dyspnea 2/2 suspected acute heart failure exacerbation Differential diagnoses include acute HF exacerbation versus PE.  PE less likely as he is on chronic anticoagulation and has a Wells score of 2.5.  Echo yesterday (11/18) demonstrated EF 30-35% with moderately decreased left ventricular function, LV global hypokinesis, grade 3 diastolic dysfunction, severe regurgitation of the mitral, tricuspid valves.  Severe calcification of aortic valve with associated stenosis.  Given this echo, worsened from last, consulted cardiology. -Cardiology consulted, appreciate involvement and recommendations -Holding diuresis given reduced EF and severe stenosis -Continuous telemetry -Continuous pulse ox -Vitals per unit routine -AM CBC, CMP -Strict I's and O's -Daily weights  Iron deficiency anemia Hemoglobin today stable at 8.3, 8.1 yesterday.  B12 and folate labs normal. -Follow-up blood smear -Transfusion threshold Hgb 8, but will consult cards before administering due to complex cardiac history -Continue Protonix -Morning CBCs, if remains stable plan for outpatient follow-up -Will continue Eliquis and aspirin given significant cardiac history and risk, but will monitor hemoglobin closely -B12 and  folate normal   CAD s/p CABG   Paroxysmal A. fib s/p pacemaker   aortic valve stenosis   tachy-brady syndrome Home meds include Eliquis 5 mg, aspirin 81 mg, benazepril 40 mg, diltiazem 24-hour 240 mg.  No evidence of A. fib on physical exam or the 2-lead monitor at this time. -Will continue Eliquis and aspirin given significant cardiac history and risk, but will monitor hemoglobin closely -Reduce Eliquis to 2.5 mg daily -Admit to med telemetry floor with continuous telemetry  HTN BP last 24 hours (115-141)/(51-92), last 115/69 with pulse 60.  Pulses range 59-87. Although history of hypertension, not currently on antihypertensive medication aside from cardiac meds benazepril and diltiazem as noted above.   -Continue home meds -Continue to monitor -Vitals per unit routine  T2DM Glucose last 24 hours 97-262.  Home meds include glimepiride 4 mg and Tradjenta 5 mg tablet daily.   -Continue home meds -No sliding scale insulin at this time, will reevaluate for need  CKD stage III  Creatinine this morning stable at 1.69, up slightly from 1.62 yesterday.  Baseline creatinine suspected to be 1.7.  Given worsening cardiac condition as evidenced by echo, detailed above, will not diurese at this time. -Monitor daily creatinine -Avoid nephrotoxic agents  Hyperlipidemia On simvastatin 10 mg daily at bedtime at home. -Continue home meds  GERD On Protonix 40 mg daily at home. -Decline to start inpatient at this time -Monitor for symptoms   FEN/GI: Heart healthy carb modified Prophylaxis: Eliquis and aspirin  Status is: Observation  The patient remains OBS appropriate and will d/c before 2 midnights.  Dispo: The patient is from: Home              Anticipated d/c is to: Home  Anticipated d/c date is: 2 days              Patient currently is not medically stable to d/c.   Subjective:  Patient resting comfortably in bed this morning with daughter at the bedside.  No  acute distress, has no complaints whatsoever.  Voices that he is ready to go home.  Daughter has many questions regarding his status, including whether or not we can simply give him oxygen transfusions blood.  Spent a little bit of time discussing his complex cardiac situation and different considerations that guide our decision making.  Overall, looks well and has no complaints.  Objective: Temp:  [97.7 F (36.5 C)-98.9 F (37.2 C)] 97.7 F (36.5 C) (11/19 1156) Pulse Rate:  [59-67] 65 (11/19 1156) Resp:  [16-24] 18 (11/19 1156) BP: (115-141)/(51-92) 124/65 (11/19 1156) SpO2:  [90 %-98 %] 98 % (11/19 1156) Weight:  [52.4 kg] 52.4 kg (11/19 0424) Physical Exam: General: Frail elderly man in no acute distress Cardiovascular: Regular rate and rhythm, S1 crescendo-decrescendo holosystolic murmur consistent with aortic stenosis Respiratory: Rhonchi in anterior fields bilaterally, improved from yesterday Abdomen: Soft, nondistended, nontender Extremities: No BLE edema  Laboratory: Recent Labs  Lab 05/12/20 1006 05/13/20 0057  WBC 10.7* 9.9  HGB 8.1* 8.3*  HCT 26.2* 25.4*  PLT 285 285   Recent Labs  Lab 05/12/20 1006 05/13/20 0057  NA 138 138  K 4.6 3.6  CL 105 102  CO2 25 27  BUN 21 20  CREATININE 1.62* 1.69*  CALCIUM 8.8* 8.8*  GLUCOSE 262* 159*    Imaging/Diagnostic Tests: ECHO complete 05/12/2020 1. Left ventricular ejection fraction, by estimation, is 30 to 35%. The  left ventricle has moderately decreased function. The left ventricle  demonstrates global hypokinesis. Left ventricular diastolic parameters are  consistent with Grade III diastolic  dysfunction (restrictive). Elevated left atrial pressure.  2. Right ventricular systolic function is moderately reduced. The right  ventricular size is moderately enlarged. There is severely elevated  pulmonary artery systolic pressure. The estimated right ventricular  systolic pressure is 27.0 mmHg.  3. Left atrial  size was severely dilated.  4. Right atrial size was mildly dilated.  5. The mitral valve is abnormal. Severe mitral valve regurgitation.  Moderate mitral annular calcification.  6. The tricuspid valve is abnormal. Tricuspid valve regurgitation is  severe.  7. The aortic valve is calcified. There is severe calcifcation of the  aortic valve. Severe aortic valve stenosis.Vmax 3.9 m/s, MG 35 mmHg, AVA  0.8 cm^2, DI 0.20  8. The inferior vena cava is dilated in size with <50% respiratory  variability, suggesting right atrial pressure of 15 mmHg.   Conclusion(s)/Recommendation(s): Compared to prior, LV systolic function  has worsened and now with severe MR, TR, and AS.    Ezequiel Essex, MD 05/13/2020, 12:13 PM PGY-1, Lead Intern pager: [phone], text pages welcome

## 2020-05-13 NOTE — Progress Notes (Signed)
Pt had muscle aches on moderate dose of lipitor. In consultation with pharmacist, will reduce cestor to 10 mg for renal function.

## 2020-05-13 NOTE — Progress Notes (Signed)
  Mobility Specialist Criteria Algorithm Info.  SATURATION QUALIFICATIONS: (This note is used to comply with regulatory documentation for home oxygen)  Patient Saturations on Room Air at Rest = 97%  Patient Saturations on Room Air while Ambulating = 91%  Patient Saturations on 0 Liters of oxygen while Ambulating = n/a  Please briefly explain why patient needs home oxygen:  Mobility Team:  [facility] elevated:HOB 30 Activity: Ambulated in hall (in chair before and after ambulation) Range of motion: Active;All extremities Level of assistance: [employer]; Standby assist, set-up cues, supervision of patient - no hands on Assistive device: None Minutes sitting in chair:  Minutes stood: 5 minutes Minutes ambulated: 5 minutes Distance ambulated (ft): 500 ft Mobility response: Tolerated well; HOH Bed Position: Chair  Received pt in chair very eager to participate in mobility as he is determined to get better as fast as he can. Per report from daughter, PTA pt was independent with ambulation, ADL's, and was working 6-days a week. He stood and ambulated in hallway 573ft without the need for an assistive device or supplemental O2. Pt has a tendency to walk fast and becomes slightly off balanced requiring min G to steady patient. Overall pt tolerated ambulation well with no complaints and is now sitting in recliner chair with all needs met.   05/13/2020 3:55 PM

## 2020-05-13 NOTE — Consult Note (Addendum)
Cardiology Consultation:   Patient ID: [name] MRN: [id number]; DOB: 1941/04/08  Admit date: 05/12/2020 Date of Consult: 05/13/2020  Primary Care Provider: [name], NP [facility] Cardiologist: [name], MD  [facility] Electrophysiologist:  Will Meredith Leeds, MD    Patient Profile:   Bryan Carney is a 79 y.o. male with a hx of CAD s/p CABG, carotid artery stenosis s/p CEA, DM, PAF on chronic anticoagulation, PE, HTN,  And HLD who is being seen today for the evaluation of CHF exacerbation and AS at the request of Dr. Erin Hearing.  History of Present Illness:   Bryan Carney is s/p CABG in 1999 and carotid endarterectomy in 1996. He had a remote PE (at the time of his CABG) and was on coumadin. Nuclear stress test 05/2012 was nonischemic. He was hospitalized 03/2017 with chest wall hematoma and acute blood loss anemia. Hospitalization complicated by aspiration PNA, PAF, and altered mental status. Coumadin was changed to eliquis. He also struggles with back pain and walks with a cane.  He was referred to EP for SSS and had dual chamber PPM placed 02/27/18. He has paroxysmal atrial fibrillation and is symptomatic when he is in Afib (weakness, fatigue, SOB). He is maintained on amiodarone, cardizem, benazepril, and eliquis. HLD controlled with 10 mg simvastatin.  On echo 2018, he was noted to have mild AS. Repeat echo 02/2019 showed EF of 45-50%, left atria moderately dilated, moderate MR, mild AS, and aortic root measured 4.0 cm.   He was last seen by Dr. [name] on 05/02/20. Pt complained of symptomatic Afib (fatigue) during the preceding weekend, but Dr. [name] did not fine evidence of Afib during that time. Because he continued to be fatigued, he visited his PCP who then found his Hb dropped from mid-10 range three months ago to low 8 range. She started him on protonix and referred to GI. She also started him on lasix for three days due to crackles on exam.  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 However, on Wed night while getting into his car, he felt short of breath with chest tightness. He does not report pillow orthopnea, but continued to have significant dyspnea the next day. In addition, he experienced hemoptysis prompting EMS dispatch.   He presented to [facility] after hemoptysis and chest tightness. He was hypoxic and required oxygen. Troponin mildly elevated and CHF exacerbation was suspected; he was admitted. Repeat echo showed reduction in his EF to 30-35%, grade III DD in a restrictive pattern, enlarged RV with severely elevated PA pressure, severely dilated left atrial size, severe MR, severe TR, severe AS, and increased right atrial pressure. Cardiology was consulted.   He has diuresed on 40 mg IV lasix x 2 and feels much better. He has had no further chest tightness since admission. He has had another bout of hemoptysis this morning - appeared to be bright red blood.    Past Medical History:  Diagnosis Date  . Anemia   . Anginal pain ([place])   . Arthritis   . CAD (coronary artery disease)   . CHF (congestive heart failure) ([place])   . Chronic kidney disease   . COPD (chronic obstructive pulmonary disease) ([place])   . Diabetes ([place])   . Dyspnea   . Dysrhythmia   . Fibromyalgia    25 years ago.  Marland Kitchen Heart murmur   . Hx pulmonary embolism   . Hyperlipidemia   . Hypertension   .  Myocardial infarction ([place])   . Pneumonia   . Presence of permanent cardiac pacemaker   . Prostate cancer ([place])   . PVOD (pulmonary veno-occlusive disease) ([place])   . Squamous cell carcinoma   . Stroke [facility])    22 years ago no deficits noted.    Past Surgical History:  Procedure Laterality Date  . BACK SURGERY    . CARDIAC CATHETERIZATION  10/15/1997   Recommend complete revascularization by CABG  . CARDIOVASCULAR STRESS TEST  06/12/2012   No evidence of ischemia  . CAROTID DOPPLER  08/27/2012   Rt bulb/proximal ICA demonstrated a  mild-moderate amount of fibrous plaque without evidence of a significant diameter reduction or any other vascular abnormality.  . CAROTID ENDARTERECTOMY Left 02/23/1995  . CORONARY ANGIOPLASTY    . CORONARY ARTERY BYPASS GRAFT  10/19/1997   x5. LIMA-LAD, SVG-PDA, SVG-OM1, seq SVG-fist and second branches of ramus intermedius.  Marland Kitchen HERNIA REPAIR     groin area.  Randolm Idol / REPLACE / REMOVE PACEMAKER    . NO PAST SURGERIES    . PACEMAKER IMPLANT N/A 02/27/2018   Procedure: PACEMAKER IMPLANT;  Surgeon: Constance Haw, MD;  Location: [facility];  Service: Cardiovascular;  Laterality: N/A;     Home Medications:  Prior to Admission medications   Medication Sig Start Date End Date Taking? Authorizing Provider  amiodarone (PACERONE) 200 MG tablet TAKE ONE TABLET BY MOUTH DAILY--please make overdue follow up appt for further refills [phone] 1st attempt Patient taking differently: 200 mg. TAKE ONE TABLET BY MOUTH DAILY--please make overdue follow up appt for further refills [phone] 1st attempt 11/02/19  Yes Camnitz, Ocie Doyne, MD  apixaban (ELIQUIS) 5 MG TABS tablet Take 1 tablet (5 mg total) by mouth 2 (two) times daily. 04/05/17  Yes Love, Ivan Anchors, PA-C  aspirin EC 81 MG tablet Take 81 mg by mouth daily.   Yes [provider]  benazepril (LOTENSIN) 40 MG tablet Take 1 tablet (40 mg total) by mouth daily. 04/05/17  Yes Love, Ivan Anchors, PA-C  cetirizine (ZYRTEC) 10 MG tablet Take 10 mg by mouth daily.   Yes [provider]  diltiazem (CARDIZEM CD) 240 MG 24 hr capsule Take 1 capsule (240 mg total) by mouth daily. 03/08/20  Yes Camnitz, Will Hassell Done, MD  ELDERBERRY PO Take 300 mg by mouth daily.   Yes [provider]  ferrous sulfate 325 (65 FE) MG tablet Take 325 mg by mouth 2 (two) times daily with a meal.    Yes [provider]  glimepiride (AMARYL) 4 MG tablet Take 1 tablet (4 mg total) by mouth daily with breakfast. 04/06/17  Yes Love, Ivan Anchors,  PA-C  linagliptin (TRADJENTA) 5 MG TABS tablet Take 1 tablet (5 mg total) by mouth daily. 04/06/17  Yes Love, Ivan Anchors, PA-C  pantoprazole (PROTONIX) 40 MG tablet Take 40 mg by mouth daily. 05/07/20  Yes [provider]  simvastatin (ZOCOR) 20 MG tablet Take 0.5 tablets (10 mg total) by mouth at bedtime. Patient taking differently: Take 20 mg by mouth daily.  04/05/17  Yes Love, Ivan Anchors, PA-C    Inpatient Medications: Scheduled Meds: . amiodarone  200 mg Oral Daily  . apixaban  5 mg Oral BID  . aspirin EC  81 mg Oral Daily  . benazepril  20 mg Oral Daily  . diltiazem  240 mg Oral Daily  . glimepiride  4 mg Oral Q breakfast  . linagliptin  5 mg Oral Daily  .  pneumococcal 23 valent vaccine  0.5 mL Intramuscular Tomorrow-1000  . simvastatin  10 mg Oral QHS   Continuous Infusions:  PRN Meds:   Allergies:    Allergies  Allergen Reactions  . Lopressor [Metoprolol Tartrate] Other (See Comments)    Severe BP drop  . Lipitor [Atorvastatin]     Muscle aches    Social History:   Social History   Socioeconomic History  . Marital status: Married    Spouse name: Haydyn Girvan  . Number of children: 1  . Years of education: High school 12 years  . Highest education level: 12th grade  Occupational History  . Occupation: Immunologist    Comment: owns car business  Tobacco Use  . Smoking status: Never Smoker  . Smokeless tobacco: Never Used  Vaping Use  . Vaping Use: Never used  Substance and Sexual Activity  . Alcohol use: No  . Drug use: Never  . Sexual activity: Not Currently  Other Topics Concern  . Not on file  Social History Narrative  . Not on file   Social Determinants of Health   Financial Resource Strain:   . Difficulty of Paying Living Expenses: Not on file  Food Insecurity:   . Worried About Charity fundraiser in the Last Year: Not on file  . Ran Out of Food in the Last Year: Not on file  Transportation Needs:   . Lack of Transportation (Medical):  Not on file  . Lack of Transportation (Non-Medical): Not on file  Physical Activity:   . Days of Exercise per Week: Not on file  . Minutes of Exercise per Session: Not on file  Stress:   . Feeling of Stress : Not on file  Social Connections:   . Frequency of Communication with Friends and Family: Not on file  . Frequency of Social Gatherings with Friends and Family: Not on file  . Attends Religious Services: Not on file  . Active Member of Clubs or Organizations: Not on file  . Attends Archivist Meetings: Not on file  . Marital Status: Not on file  Intimate Partner Violence:   . Fear of Current or Ex-Partner: Not on file  . Emotionally Abused: Not on file  . Physically Abused: Not on file  . Sexually Abused: Not on file    Family History:    Family History  Problem Relation Age of Onset  . Heart attack Father   . Cancer Sister   . Diabetes Brother   . Cancer Sister   . Diabetes Brother   . Diabetes Brother   . Diabetes Brother      ROS:  Please see the history of present illness.   All other ROS reviewed and negative.     Physical Exam/Data:   Vitals:   05/12/20 1941 05/13/20 0024 05/13/20 0424 05/13/20 0752  BP: (!) 121/56 132/70 129/72 115/69  Pulse: 60 60 (!) 59 60  Resp: 18 18 18 16   Temp: 98.6 F (37 C) 98.4 F (36.9 C) 98.9 F (37.2 C) 98.3 F (36.8 C)  TempSrc: Oral Oral Oral Oral  SpO2: 94% 95% 96% 97%  Weight:   52.4 kg   Height:        Intake/Output Summary (Last 24 hours) at 05/13/2020 1107 Last data filed at 05/13/2020 0850 Gross per 24 hour  Intake 720 ml  Output 3315 ml  Net -2595 ml   Last 3 Weights 05/13/2020 05/12/2020 05/02/2020  Weight (lbs) 115 lb 8.3 oz  121 lb 137 lb  Weight (kg) 52.4 kg 54.885 kg 62.143 kg     Body mass index is 18.65 kg/m.  General:  Thin, elderly male in NAD HEENT: normal Lymph: no adenopathy Neck: + JVD Vascular: systolic murmur heard in carotids Cardiac:  normal S1, S2; RRR; 4/6 systolic  murmur Lungs:  Crackles throughout Abd: soft, nontender, no hepatomegaly  Ext: no edema Musculoskeletal:  No deformities, BUE and BLE strength normal and equal Skin: warm and dry  Neuro:  CNs 2-12 intact, no focal abnormalities noted Psych:  Normal affect   EKG:  The EKG was personally reviewed and demonstrates:  Paced rhythm Telemetry:  Telemetry was personally reviewed and demonstrates:  Paced rhythm  Relevant CV Studies:  Echo 05/12/20: 1. Left ventricular ejection fraction, by estimation, is 30 to 35%. The  left ventricle has moderately decreased function. The left ventricle  demonstrates global hypokinesis. Left ventricular diastolic parameters are  consistent with Grade III diastolic  dysfunction (restrictive). Elevated left atrial pressure.  2. Right ventricular systolic function is moderately reduced. The right  ventricular size is moderately enlarged. There is severely elevated  pulmonary artery systolic pressure. The estimated right ventricular  systolic pressure is 62.8 mmHg.  3. Left atrial size was severely dilated.  4. Right atrial size was mildly dilated.  5. The mitral valve is abnormal. Severe mitral valve regurgitation.  Moderate mitral annular calcification.  6. The tricuspid valve is abnormal. Tricuspid valve regurgitation is  severe.  7. The aortic valve is calcified. There is severe calcifcation of the  aortic valve. Severe aortic valve stenosis.Vmax 3.9 m/s, MG 35 mmHg, AVA  0.8 cm^2, DI 0.20  8. The inferior vena cava is dilated in size with <50% respiratory  variability, suggesting right atrial pressure of 15 mmHg.  Laboratory Data:  High Sensitivity Troponin:   Recent Labs  Lab 05/12/20 1006 05/12/20 1231  TROPONINIHS 37* 37*     Chemistry Recent Labs  Lab 05/12/20 1006 05/13/20 0057  NA 138 138  K 4.6 3.6  CL 105 102  CO2 25 27  GLUCOSE 262* 159*  BUN 21 20  CREATININE 1.62* 1.69*  CALCIUM 8.8* 8.8*  GFRNONAA 43* 41*    ANIONGAP 8 9    No results for input(s): PROT, ALBUMIN, AST, ALT, ALKPHOS, BILITOT in the last 168 hours. Hematology Recent Labs  Lab 05/12/20 1006 05/13/20 0057  WBC 10.7* 9.9  RBC 2.56* 2.57*  HGB 8.1* 8.3*  HCT 26.2* 25.4*  MCV 102.3* 98.8  MCH 31.6 32.3  MCHC 30.9 32.7  RDW 14.8 14.7  PLT 285 285   BNP Recent Labs  Lab 05/12/20 1055  BNP 1,805.1*    DDimer No results for input(s): DDIMER in the last 168 hours.   Radiology/Studies:  DG Chest 2 View  Result Date: 05/12/2020 CLINICAL DATA:  Chest tightness EXAM: CHEST - 2 VIEW COMPARISON:  05/10/2020 FINDINGS: Stable positioning of left-sided implanted cardiac device. Post CABG changes. Stable cardiomegaly. Atherosclerotic calcification of the aortic knob. Progressive bilateral perihilar interstitial opacities. Lung fields are hyperexpanded with flattening of the diaphragms. No pleural effusion or pneumothorax. IMPRESSION: Progressive bilateral perihilar interstitial opacities, with appearance favoring worsening pulmonary edema over atypical/viral infection. Electronically Signed   By: Davina Poke D.O.   On: 05/12/2020 10:36   ECHOCARDIOGRAM COMPLETE  Result Date: 05/12/2020    ECHOCARDIOGRAM REPORT   Patient Name:   Bryan Carney Date of Exam: 05/12/2020 Medical Rec #:  [id number]     Height:  66.0 in Accession #:    [id number]    Weight:       121.0 lb Date of Birth:  02-10-1941     BSA:          1.615 m Patient Age:    29 years      BP:           130/57 mmHg Patient Gender: M             HR:           68 bpm. Exam Location:  Inpatient Procedure: 2D Echo, Cardiac Doppler and Color Doppler Indications:    CHF-Acute Systolic 580.99 / I33.82  History:        Patient has prior history of Echocardiogram examinations, most                 recent 03/04/2019. CAD; Risk Factors:Hypertension, Diabetes,                 Dyslipidemia and Non-Smoker. Pulmonary embolism.  Sonographer:    Vickie Epley RDCS Referring Phys: [place]  1. Left ventricular ejection fraction, by estimation, is 30 to 35%. The left ventricle has moderately decreased function. The left ventricle demonstrates global hypokinesis. Left ventricular diastolic parameters are consistent with Grade III diastolic dysfunction (restrictive). Elevated left atrial pressure.  2. Right ventricular systolic function is moderately reduced. The right ventricular size is moderately enlarged. There is severely elevated pulmonary artery systolic pressure. The estimated right ventricular systolic pressure is 50.5 mmHg.  3. Left atrial size was severely dilated.  4. Right atrial size was mildly dilated.  5. The mitral valve is abnormal. Severe mitral valve regurgitation. Moderate mitral annular calcification.  6. The tricuspid valve is abnormal. Tricuspid valve regurgitation is severe.  7. The aortic valve is calcified. There is severe calcifcation of the aortic valve. Severe aortic valve stenosis.Vmax 3.9 m/s, MG 35 mmHg, AVA 0.8 cm^2, DI 0.20  8. The inferior vena cava is dilated in size with <50% respiratory variability, suggesting right atrial pressure of 15 mmHg. Conclusion(s)/Recommendation(s): Compared to prior, LV systolic function has worsened and now with severe MR, TR, and AS. FINDINGS  Left Ventricle: Left ventricular ejection fraction, by estimation, is 30 to 35%. The left ventricle has moderately decreased function. The left ventricle demonstrates global hypokinesis. The left ventricular internal cavity size was normal in size. There is no left ventricular hypertrophy. Left ventricular diastolic parameters are consistent with Grade III diastolic dysfunction (restrictive). Elevated left atrial pressure. Right Ventricle: The right ventricular size is moderately enlarged. Right vetricular wall thickness was not assessed. Right ventricular systolic function is moderately reduced. There is severely elevated pulmonary artery systolic pressure. The tricuspid  regurgitant velocity is 3.86 m/s, and with an assumed right atrial pressure of 15 mmHg, the estimated right ventricular systolic pressure is 39.7 mmHg. Left Atrium: Left atrial size was severely dilated. Right Atrium: Right atrial size was mildly dilated. Pericardium: There is no evidence of pericardial effusion. Mitral Valve: The mitral valve is abnormal. Moderate mitral annular calcification. Severe mitral valve regurgitation. Tricuspid Valve: The tricuspid valve is abnormal. Tricuspid valve regurgitation is severe. Aortic Valve: The aortic valve is calcified. There is severe calcifcation of the aortic valve. Aortic valve regurgitation is trivial. Severe aortic stenosis is present. Aortic valve mean gradient measures 28.0 mmHg. Aortic valve peak gradient measures 47.9 mmHg. Aortic valve area, by VTI measures 0.91 cm. Pulmonic Valve: The pulmonic valve was not well visualized. Pulmonic  valve regurgitation is not visualized. Aorta: The aortic root is normal in size and structure. Venous: The inferior vena cava is dilated in size with less than 50% respiratory variability, suggesting right atrial pressure of 15 mmHg. IAS/Shunts: The interatrial septum was not well visualized.  LEFT VENTRICLE PLAX 2D LVIDd:         5.40 cm  Diastology LVIDs:         4.90 cm  LV e' medial:    5.19 cm/s LV PW:         0.80 cm  LV E/e' medial:  25.6 LV IVS:        0.80 cm  LV e' lateral:   7.46 cm/s LVOT diam:     2.30 cm  LV E/e' lateral: 17.8 LV SV:         71 LV SV Index:   44 LVOT Area:     4.15 cm                          3D Volume EF:                         3D EF:        37 %                         LV EDV:       205 ml                         LV ESV:       130 ml                         LV SV:        75 ml RIGHT VENTRICLE RV S prime:     5.84 cm/s TAPSE (M-mode): 1.1 cm LEFT ATRIUM              Index       RIGHT ATRIUM           Index LA diam:        5.30 cm  3.28 cm/m  RA Area:     19.80 cm LA Vol (A2C):   114.0 ml 70.58  ml/m RA Volume:   56.50 ml  34.98 ml/m LA Vol (A4C):   60.3 ml  37.33 ml/m LA Biplane Vol: 89.6 ml  55.47 ml/m  AORTIC VALVE AV Area (Vmax):    0.94 cm AV Area (Vmean):   0.94 cm AV Area (VTI):     0.91 cm AV Vmax:           346.00 cm/s AV Vmean:          245.000 cm/s AV VTI:            0.782 m AV Peak Grad:      47.9 mmHg AV Mean Grad:      28.0 mmHg LVOT Vmax:         78.30 cm/s LVOT Vmean:        55.600 cm/s LVOT VTI:          0.171 m LVOT/AV VTI ratio: 0.22  AORTA Ao Root diam: 3.60 cm MITRAL VALVE                 TRICUSPID VALVE MV Area (PHT): 6.54 cm  TR Peak grad:   59.6 mmHg MV Decel Time: 116 msec      TR Vmax:        386.00 cm/s MR Peak grad:    132.9 mmHg MR Mean grad:    84.0 mmHg   SHUNTS MR Vmax:         576.50 cm/s Systemic VTI:  0.17 m MR Vmean:        432.0 cm/s  Systemic Diam: 2.30 cm MR PISA:         1.01 cm MR PISA Eff ROA: 7 mm MR PISA Radius:  0.40 cm MV E velocity: 133.00 cm/s MV A velocity: 28.40 cm/s MV E/A ratio:  4.68 Oswaldo Milian MD Electronically signed by Oswaldo Milian MD Signature Date/Time: 05/12/2020/10:43:00 PM    Final      Assessment and Plan:   Severe aortic stenosis - aortic stenosis has worsened since last echo in 2020 - will be mindful of volume status - consider evaluation by structural heart team once anemia has stabilized and he is more euvolemic   Acute on chronic systolic and diastolic heart failure Mitral regurgitation - ?functional - BNP 1800, CXR with pulmonary edema - EF now 30-35% - has diuresed on 40 mg IV lasix x 2 doses - overall negative 2.2 L with 2.7 L urine output yesterday - down 2.5 kg overnight - suspect he will need standing diuretic going forward - will transition to 20 mg IV lasix BID and follow renal function - will D/C cardizem and start toprol 12.5 mg - he is on an ACEI at home --> will consider transitioning to ARB with a goal for entresto, luckily his appears to have adequate BP to tolerate  medication changes   Hemoptysis - has had two episodes - reports a chronic cough x 4-5 years for which workup has been negative - appears to be bright red blood, may be related to his volume status - questions contribution of amiodarone to underlying pulmonary disease, will D/C ACEI   PAF Chronic anticoagulation - is on 5 mg eliquis, although patient is 79 yo and 52 kg with anemia - This patients CHA2DS2-VASc Score and unadjusted Ischemic Stroke Rate (% per year) is equal to 10.8 % stroke rate/year from a score of 8 (CHF, HTN, DM, CAD, 2age, 2PE) - will decrease eliquis to 2.5 mg BID - pt has a history of being symptomatic when in Afib - continue amiodarone - given dilated atria, will continue with rate control - cardizem is not a great choice for HFrEF --> will transition to toprol 12.5 mg, will start low dose since he has a history of hypotension on lopressor   CAD s/p CABG 1999 - nuclear stress test in 2013 encouraging - hs troponin 37 --> 37 - do not suspect an ischemic process - continue ASA   Hyperlipidemia with LDL goal < 70 - on 10 mg simvastatin - will repeat a lipid panel tomorrow morning - transition simvastatin to 20 mg crestor   Hypertension - maintained on benazepril --> transition this to losartan 25 mg   Anemia - Hb 8.3 (8.1) - will need to work this up prior to studies required for aortic valve treatment (SAVR vs TAVR) - recommend heme stool - will defer GI consult to primary team   DM - glimepiride and tradjenta - per primary   CKD stage III - sCr baseline appears to be 1.5-1.6 - sCr 1.69 today - continue daily BMP   Difficult situation given his  ongoing anemia and CKD. 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 He lives at home with his wife and does not have a significant fall history. I think it is reasonable to move toward aggressive therapies for his comorbid conditions.  He has had a progression in his aortic  stenosis likely contributing his his dyspnea and chest tightness. Unfortunately, a concomitant drop in his EF and likely functional MR complicates management. First, we will focus on adequately diuresing him and titrating medications for his HFrEF. Anemia and hemoptysis need to be addressed prior to moving forward with evaluation with the structural heart team, including right and left heart catheterization should he need an intervention. We will need to be mindful of his renal function, as well. They are aware that he is at risk for developing ESRD and needing HD should we move forward.          [place] Heart Association (NYHA) Functional Class NYHA Class II   CHA2DS2-VASc Score = 6  This indicates a 9.7% annual risk of stroke. The patient's score is based upon: CHF History: 1 HTN History: 1 Diabetes History: 1 Stroke History: 0 Vascular Disease History: 1 Age Score: 2 Gender Score: 0         For questions or updates, please contact [facility] Please consult [email or web address] for contact info under    Signed, Ledora Bottcher, PA  05/13/2020 11:07 AM  Patient seen and examined and agree with Fabian Sharp, PA as detailed above.  In brief, Mr. Xiang is a very functional 79 year old male with history of CAD s/p remote CABG, CAS s/p CEA, DMII, pAfib on elquis, history of PE, HTN and HLD who presented with worsening SOB found to have acute on chronic systolic heart failure. TTE with worsening of LVEF to 30-35%, severe AS, severe MR, severe TR, severe pulmonary HTN with enlarged RV and grade III diastolic dysfunction for which Cardiology has been consulted.  Patient is very functional at baseline and continues to work 6days/week. He was noted to have mild AS on TTE in 02/2019 which has progressed significantly to severe on most recent study with AVA 0.8, mean gradient 35, Vmax 3.9, DOI 0.2.  Also notably has been anemic with hemoglobin 8 on admission. Had episodes of bloody sputum  with prolonged coughing but no black tarry stool, blood in urine, bloody stool, or recent weight loss/systemic symptoms to suggest malignancy. On apixaban for Afib with CHADs-vasc 6.   Ultimately, the goal is to diurese him to euvolemia, initiate GDMT for underlying HF, ensure blood pressure is well controlled and refer to structural heart team to assess for possible TAVR. Will need anemia work-up as well as do not think the bloody sputum is the underlying etiology. Likely GI team would want work-up of severe AS first unless overt signs of bleeding. Given elderly age, low BMI and anemia, will decrease the dose of apixaban to 2.5mg  BID. Patient continues to be overloaded and responded well to diuresis yesterday. Will do additional day of IV diuretic and transition to PO over the weekend. Anticipate that MR and pulmonary HTN will improve once AS is fixed.  Exam: GEN: Sitting up in the chair, comfortable, hard of hearing   Neck: JVD to mid-neck Cardiac: RRR, 2/6 harsh systolic murmur radiating to carotids with loss of A2.  Respiratory: Crackles to mid lung fields bilaterally GI: Soft, nontender, non-distended  MS: No edema; No deformity. Neuro:  Nonfocal  Psych: Normal affect    Plan: -Diuresis with lasix 20mg  IV BID -Change ACE to ARB (chronic cough with ACE)-->ultimately transition to entresto -Decrease apixaban to 2.5mg  BID given age and BMI -Change simvastatin to crestor 20mg   -Stop diltiazem and start metop 12.5mg  XL -Continue amiodarone -Hold ASA for now given anemia and bloody sputum -Add spironolactone and jardiance prior to discharge -Ultimately, goal is to attain euvolemia and follow-up with structural team for management of AS. Suspect MR/pulmonary HTN will improve with treatment of AS. -Start PPI BID -Will need anemia work-up and close GI follow-up--given lack of overt bleeding (do not suspect bloody sputum is main driver of anemia), will continue [facility] for now as patient is very high  stroke risk. Will need close monitoring of hemoglobin as out-patient  Gwyndolyn Kaufman, MD

## 2020-05-13 NOTE — Discharge Instructions (Addendum)
Dear Bryan [facility],   Thank you for letting [facility] participate in your care! In this section, you will find a brief [facility] admission summary of why you were admitted to the [facility], what happened during your admission, your diagnosis/diagnoses, and recommended follow up.   You were admitted because you were experiencing chest tightness. Your initial work up showed that your heart function was worsening and cardiology was consulted given your complicated cardiac history. They made multiple recommendations and medication changes. You will follow up with them outpatient. Briefly, the follow changes were made to your medications:  Simvastatin was switched to Crestor 10 mg  Eliquis was decreased to 2.5 mg twice daily  Added lasix 20 mg once daily  Cardizem was discontinued  Benzapril was switched to losartan 25 mg  They also wanted to add metoprolol; however, given your history of adverse effect, you were not discharged with this medication   You will follow up with cardiology next week. They will make this appointment for you.   You were also noted to have a low hemoglobin (also called, "anemia"). This means your blood counts were low. We recommend follow up with your primary care provider and with gastroenterology for further evaluation.    POST-[facility] & CARE INSTRUCTIONS 1. Please let PCP/Specialists know of any changes that were made.  2. Please see medications section of this packet for any medication changes.   DOCTOR'S APPOINTMENT & FOLLOW UP CARE INSTRUCTIONS  Future Appointments  Date Time Provider [place]  05/27/2020 11:15 AM [facility] [facility] 1 [facility] None  05/31/2020  8:40 AM [facility] DEVICE REMOTES [facility]  06/29/2020  2:10 PM [name], MD [facility]  08/30/2020  8:40 AM [facility] DEVICE REMOTES [facility]  11/29/2020  8:40 AM [facility] DEVICE REMOTES [facility]  02/28/2021  8:40 AM [facility] DEVICE REMOTES  [facility]  05/30/2021  8:40 AM [facility] DEVICE REMOTES [facility]  08/29/2021  8:40 AM [facility] DEVICE REMOTES [facility]     Thank you for choosing [facility]! Take care and be well!  Bryan Carney  [place], [place] [address] [phone]   Heart Failure, Diagnosis  Heart failure is a condition in which the heart has trouble pumping blood because it has become weak or stiff. This means that the heart does not pump blood well enough for the body to stay healthy. For some people with heart failure, fluid may back up into the lungs. There may also be swelling (edema) in the lower legs. Heart failure is usually a long-term (chronic) condition. It is important for you to take good care of yourself and follow the treatment plan from your health care provider. What are the causes? This condition may be caused by:  High blood pressure (hypertension). Hypertension causes the heart muscle to work harder than normal. This makes the heart stiff or weak.  Coronary artery disease, or CAD. CAD is the buildup of cholesterol and fat (plaque) in the arteries of the heart.  Heart attack, also called myocardial infarction. This injures the heart muscle, making it hard for the heart to pump blood.  Abnormal heart valves. The valves do not open and close properly, forcing the heart to pump harder to keep the blood flowing.  Heart muscle disease (cardiomyopathy or myocarditis). This is damage to the heart muscle. It can increase the risk of heart failure.  Lung disease. The heart works harder  when the lungs are not healthy.  Abnormal heart rhythms. These can lead to heart failure. What increases the risk? The risk of heart failure increases as a person ages. This condition is also more likely to develop in people who:  Are overweight.  Are male.  Smoke  or chew tobacco.  Abuse alcohol or illegal drugs.  Have taken medicines that can damage the heart, such as chemotherapy drugs.  Have diabetes.  Have abnormal heart rhythms.  Have thyroid problems.  Have low blood counts (anemia). What are the signs or symptoms? Symptoms of this condition include:  Shortness of breath with activity, such as when climbing stairs.  A cough that does not go away.  Swelling of the feet, ankles, legs, or abdomen.  Losing weight for no reason.  Trouble breathing when lying flat (orthopnea).  Waking from sleep because of the need to sit up and get more air.  Rapid heartbeat.  Tiredness (fatigue) and loss of energy.  Feeling light-headed, dizzy, or close to fainting.  Loss of appetite.  Nausea.  Waking up more often during the [facility] to urinate (nocturia).  Confusion. How is this diagnosed? This condition is diagnosed based on:  Your medical history, symptoms, and a physical exam.  Diagnostic tests, which may include: ? Echocardiogram. ? Electrocardiogram (ECG). ? Chest X-ray. ? Blood tests. ? Exercise stress test. ? Radionuclide scans. ? Cardiac catheterization and angiogram. How is this treated? Treatment for this condition is aimed at managing the symptoms of heart failure. Medicines Treatment may include medicines that:  Help lower blood pressure by relaxing (dilating) the blood vessels. These medicines are called ACE inhibitors (angiotensin-converting enzyme) and ARBs (angiotensin receptor blockers).  Cause the kidneys to remove salt and water from the blood through urination (diuretics).  Improve heart muscle strength and prevent the heart from beating too fast (beta blockers).  Increase the force of the heartbeat (digoxin). Healthy behavior changes     Treatment may also include making healthy lifestyle changes, such as:  Reaching and staying at a healthy weight.  Quitting smoking or chewing tobacco.  Eating  heart-healthy foods.  Limiting or avoiding alcohol.  Stopping the use of illegal drugs.  Being physically active.  Other treatments Other treatments may include:  Procedures to open blocked arteries or repair damaged valves.  Placing a pacemaker to improve heart function (cardiac resynchronization therapy).  Placing a device to treat serious abnormal heart rhythms (implantable cardioverter defibrillator, or ICD).  Placing a device to improve the pumping ability of the heart (left ventricular assist device, or LVAD).  Receiving a healthy heart from a donor (heart transplant). This is done when other treatments have not helped. Follow these instructions at home:  Manage other health conditions as told by your health care provider. These may include hypertension, diabetes, thyroid disease, or abnormal heart rhythms.  Get ongoing education and support as needed. Learn as much as you can about heart failure.  Keep all follow-up visits as told by your health care provider. This is important. Summary  Heart failure is a condition in which the heart has trouble pumping blood because it has become weak or stiff.  This condition is caused by high blood pressure and other diseases of the heart and lungs.  Symptoms of this condition include shortness of breath, tiredness (fatigue), nausea, and swelling of the feet, ankles, legs, or abdomen.  Treatments for this condition may include medicines, lifestyle changes, and surgery.  Manage other health conditions as told by your  health care provider. This information is not intended to replace advice given to you by your health care provider. Make sure you discuss any questions you have with your health care provider. Document Revised: 08/29/2018 Document Reviewed: 08/29/2018 Elsevier Patient Education  [employer]. =========================================================================    Heart Failure, Self Care Heart failure is  a serious condition. This document explains the things you need to do to take care of yourself after a heart failure diagnosis. You may be asked to change your diet, take certain medicines, and make other lifestyle changes in order to stay as healthy as possible. Your health care provider may also give you more specific instructions. If you have problems or questions, contact your health care provider. What are the risks? Having heart failure puts you at higher risk for certain problems. These problems can get worse if you do not take good care of yourself. Problems may include:  Blood clotting problems. This may cause a stroke.  Damage to the kidneys, liver, or lungs.  Abnormal heart rhythms. Supplies needed:  Scale for monitoring weight.  Blood pressure monitor.  Notebook.  Medicines. How to care for yourself when you have heart failure Medicines Take over-the-counter and prescription medicines only as told by your health care provider. Medicines reduce the workload of your heart, slow the progression of heart failure, and improve symptoms. Take your medicines every day.  Do not stop taking your medicine unless your health care provider tells you to do so.  Do not skip any dose of medicine.  Refill your prescriptions before you run out of medicine. Eating and drinking   Eat heart-healthy foods. Talk with a dietitian to make an eating plan that is right for you. ? Choose foods that contain no trans fat and are low in saturated fat and cholesterol. Healthy choices include fresh or frozen fruits and vegetables, fish, lean meats, legumes, fat-free or low-fat dairy products, and whole-grain or high-fiber foods. ? Limit salt (sodium) if told by your health care provider. Sodium restriction may reduce symptoms of heart failure. Ask a dietitian to recommend heart-healthy seasonings. ? Use healthy cooking methods instead of frying. Healthy methods include roasting, grilling, broiling,  baking, poaching, steaming, and stir-frying.  Limit your fluid intake, if directed by your health care provider. Fluid restriction may reduce symptoms of heart failure. Alcohol use  Do not drink alcohol if: ? Your health care provider tells you not to drink. ? Your heart was damaged by alcohol, or you have severe heart failure. ? You are pregnant, may be pregnant, or are planning to become pregnant.  If you drink alcohol: ? Limit how much you use to:  0-1 drink a day for women.  0-2 drinks a day for men. ? Be aware of how much alcohol is in your drink. In the [place] one drink equals one 12 oz bottle of beer (355 mL), one 5 oz glass of wine (148 mL), or one 1 oz glass of hard liquor (44 mL). Lifestyle   Do not use any products that contain nicotine or tobacco, such as cigarettes, e-cigarettes, and chewing tobacco. If you need help quitting, ask your health care provider. ? Do not use nicotine gum or patches before talking to your health care provider.  Do not use illegal drugs.  Work with your health care provider to safely reach the right body weight.  Do physical activity if told by your health care provider. Talk to your health care provider before you begin an exercise  if: ? You are an older adult. ? You have severe heart failure.  Learn to manage stress. If you need help to do this, ask your health care provider.  Participate in or seek rehabilitation as needed to keep or improve your independence and quality of life.  Plan rest periods when you get tired. Monitoring important information   Weigh yourself every day. This will help you to notice if too much fluid is building up in your body. ? Weigh yourself every morning after you urinate and before you eat breakfast. ? Wear the same amount of clothing each time you weigh yourself. ? Record your daily weight. Provide your health care provider with your weight record.  Monitor and record your pulse and blood pressure as  told by your health care provider. Dealing with extreme temperatures  If the weather is extremely hot: ? Avoid vigorous physical activity. ? Use air conditioning or fans, or find a cooler location. ? Avoid caffeine and alcohol. ? Wear loose-fitting, lightweight, and light-colored clothing.  If the weather is extremely cold: ? Avoid vigorous activity. ? Layer your clothes. ? Wear mittens or gloves, a hat, and a scarf when you go outside. ? Avoid alcohol. Follow these instructions at home:  Stay up to date with vaccines. Pneumococcal and flu (influenza) vaccines are especially important in preventing infections of the airways.  Keep all follow-up visits as told by your health care provider. This is important. Contact a health care provider if you:  Have a rapid weight gain.  Have increasing shortness of breath.  Are unable to participate in your usual physical activities.  Get tired easily.  Cough more than normal, especially with physical activity.  Lose your appetite or feel nauseous.  Have any swelling or more swelling in areas such as your hands, feet, ankles, or abdomen.  Are unable to sleep because it is hard to breathe.  Feel like your heart is beating quickly (palpitations).  Become dizzy or light-headed when you stand up. Get help right away if you:  Have trouble breathing.  Notice or your family notices a change in your awareness, such as having trouble staying awake or concentrating.  Have pain or discomfort in your chest.  Have an episode of fainting (syncope). These symptoms may represent a serious problem that is an emergency. Do not wait to see if the symptoms will go away. Get medical help right away. Call your local emergency services (911 in the [place] Do not drive yourself to the [facility]. Summary  Heart failure is a serious condition. To care for yourself, you may be asked to change your diet, take certain medicines, and make other lifestyle  changes.  Take your medicines every day. Do not stop taking them unless your health care provider tells you to do so.  Eat heart-healthy foods, such as fresh or frozen fruits and vegetables, fish, lean meats, legumes, fat-free or low-fat dairy products, and whole-grain or high-fiber foods.  Ask your health care provider if you have any alcohol restrictions. You may have to stop drinking alcohol if you have severe heart failure.  Contact your health care provider if you notice problems, such as rapid weight gain or a fast heartbeat. Get help right away if you faint, or have chest pain or trouble breathing. This information is not intended to replace advice given to you by your health care provider. Make sure you discuss any questions you have with your health care provider. Document Revised: 09/23/2018 Document Reviewed: 09/24/2018  Elsevier Patient Education  [employer].

## 2020-05-14 DIAGNOSIS — I5023 Acute on chronic systolic (congestive) heart failure: Secondary | ICD-10-CM | POA: Diagnosis not present

## 2020-05-14 LAB — COMPREHENSIVE METABOLIC PANEL
ALT: 26 U/L (ref 0–44)
AST: 22 U/L (ref 15–41)
Albumin: 2.7 g/dL — ABNORMAL LOW (ref 3.5–5.0)
Alkaline Phosphatase: 81 U/L (ref 38–126)
Anion gap: 9 (ref 5–15)
BUN: 32 mg/dL — ABNORMAL HIGH (ref 8–23)
CO2: 28 mmol/L (ref 22–32)
Calcium: 8.5 mg/dL — ABNORMAL LOW (ref 8.9–10.3)
Chloride: 99 mmol/L (ref 98–111)
Creatinine, Ser: 2.15 mg/dL — ABNORMAL HIGH (ref 0.61–1.24)
GFR, Estimated: 30 mL/min — ABNORMAL LOW (ref >60)
Glucose, Bld: 134 mg/dL — ABNORMAL HIGH (ref 70–99)
Potassium: 3.7 mmol/L (ref 3.5–5.1)
Sodium: 136 mmol/L (ref 135–145)
Total Bilirubin: 1.2 mg/dL (ref 0.3–1.2)
Total Protein: 5.7 g/dL — ABNORMAL LOW (ref 6.5–8.1)

## 2020-05-14 LAB — RETICULOCYTES
Immature Retic Fract: 19.1 % — ABNORMAL HIGH (ref 2.3–15.9)
RBC.: 2.66 MIL/uL — ABNORMAL LOW (ref 4.22–5.81)
Retic Count, Absolute: 62 10*3/uL (ref 19.0–186.0)
Retic Ct Pct: 2.3 % (ref 0.4–3.1)

## 2020-05-14 LAB — CBC
HCT: 26.7 % — ABNORMAL LOW (ref 39.0–52.0)
Hemoglobin: 8.8 g/dL — ABNORMAL LOW (ref 13.0–17.0)
MCH: 32.5 pg (ref 26.0–34.0)
MCHC: 33 g/dL (ref 30.0–36.0)
MCV: 98.5 fL (ref 80.0–100.0)
Platelets: 289 10*3/uL (ref 150–400)
RBC: 2.71 MIL/uL — ABNORMAL LOW (ref 4.22–5.81)
RDW: 14.5 % (ref 11.5–15.5)
WBC: 8.1 10*3/uL (ref 4.0–10.5)
nRBC: 0 % (ref 0.0–0.2)

## 2020-05-14 LAB — GLUCOSE, CAPILLARY: Glucose-Capillary: 81 mg/dL (ref 70–99)

## 2020-05-14 MED ORDER — FERROUS SULFATE 325 (65 FE) MG PO TABS
325.0000 mg | ORAL_TABLET | Freq: Every day | ORAL | Status: DC
  Administered 2020-05-14: 325 mg via ORAL
  Filled 2020-05-14: qty 1

## 2020-05-14 MED ORDER — FUROSEMIDE 20 MG PO TABS: 20.0000 mg | ORAL_TABLET | Freq: Every day | ORAL | 0 refills | Status: AC

## 2020-05-14 MED ORDER — FUROSEMIDE 20 MG PO TABS: 20.0000 mg | ORAL_TABLET | Freq: Every day | ORAL | Status: DC

## 2020-05-14 MED ORDER — ROSUVASTATIN CALCIUM 10 MG PO TABS: 10.0000 mg | ORAL_TABLET | Freq: Every day | ORAL | 0 refills | Status: AC

## 2020-05-14 MED ORDER — LOSARTAN POTASSIUM 25 MG PO TABS: 25.0000 mg | ORAL_TABLET | Freq: Every day | ORAL | 0 refills | Status: AC

## 2020-05-14 MED ORDER — APIXABAN 2.5 MG PO TABS: 2.5000 mg | ORAL_TABLET | Freq: Two times a day (BID) | ORAL | 0 refills | Status: AC

## 2020-05-14 NOTE — Plan of Care (Signed)

## 2020-05-14 NOTE — Progress Notes (Signed)
Progress Note  Patient Name: Bryan Carney Date of Encounter: 05/14/2020  [facility] Cardiologist: [name], MD   Subjective   Sitting in chair, comfortable feels much better.  Inpatient Medications    Scheduled Meds: . amiodarone  200 mg Oral Daily  . apixaban  2.5 mg Oral BID  . ferrous sulfate  325 mg Oral Q breakfast  . furosemide  20 mg Intravenous BID  . glimepiride  4 mg Oral Q breakfast  . linagliptin  5 mg Oral Daily  . losartan  25 mg Oral Daily  . metoprolol succinate  12.5 mg Oral Daily  . pantoprazole  40 mg Oral BID  . pneumococcal 23 valent vaccine  0.5 mL Intramuscular Tomorrow-1000  . rosuvastatin  10 mg Oral Daily   Continuous Infusions:  PRN Meds:    Vital Signs    Vitals:   05/14/20 0400 05/14/20 0420 05/14/20 0500 05/14/20 0800  BP: 109/62   111/62  Pulse: 62   65  Resp: 16     Temp:  98.1 F (36.7 C)  98.3 F (36.8 C)  TempSrc:  Oral  Oral  SpO2: 95%   99%  Weight:   54.7 kg   Height:        Intake/Output Summary (Last 24 hours) at 05/14/2020 0908 Last data filed at 05/14/2020 0500 Gross per 24 hour  Intake 600 ml  Output 1750 ml  Net -1150 ml   Last 3 Weights 05/14/2020 05/13/2020 05/12/2020  Weight (lbs) 120 lb 9.5 oz 115 lb 8.3 oz 121 lb  Weight (kg) 54.7 kg 52.4 kg 54.885 kg      Telemetry    Atrial pacing- Personally Reviewed  ECG    05/12/2020-atrial pacing- Personally Reviewed  Physical Exam   GEN: No acute distress.  Thin Neck: No JVD Cardiac: RRR, 3/6 systolic murmur, no rubs, or gallops.  Respiratory: Clear to auscultation bilaterally. GI: Soft, nontender, non-distended  MS: No edema; No deformity. Neuro:  Nonfocal  Psych: Normal affect   Labs    High Sensitivity Troponin:   Recent Labs  Lab 05/12/20 1006 05/12/20 1231  TROPONINIHS 37* 37*      Chemistry Recent Labs  Lab 05/12/20 1006 05/13/20 0057 05/14/20 0400  NA 138 138 136  K 4.6 3.6 3.7  CL 105 102 99  CO2 25 27 28    GLUCOSE 262* 159* 134*  BUN 21 20 32*  CREATININE 1.62* 1.69* 2.15*  CALCIUM 8.8* 8.8* 8.5*  PROT  --   --  5.7*  ALBUMIN  --   --  2.7*  AST  --   --  22  ALT  --   --  26  ALKPHOS  --   --  81  BILITOT  --   --  1.2  GFRNONAA 43* 41* 30*  ANIONGAP 8 9 9      Hematology Recent Labs  Lab 05/12/20 1006 05/12/20 1006 05/13/20 0057 05/14/20 0400  WBC 10.7*  --  9.9 8.1  RBC 2.56*   < > 2.57* 2.71*  2.66*  HGB 8.1*  --  8.3* 8.8*  HCT 26.2*  --  25.4* 26.7*  MCV 102.3*  --  98.8 98.5  MCH 31.6  --  32.3 32.5  MCHC 30.9  --  32.7 33.0  RDW 14.8  --  14.7 14.5  PLT 285  --  285 289   < > = values in this interval not displayed.    BNP Recent Labs  Lab 05/12/20 1055  BNP 1,805.1*     DDimer No results for input(s): DDIMER in the last 168 hours.   Radiology    DG Chest 2 View  Result Date: 05/12/2020 CLINICAL DATA:  Chest tightness EXAM: CHEST - 2 VIEW COMPARISON:  05/10/2020 FINDINGS: Stable positioning of left-sided implanted cardiac device. Post CABG changes. Stable cardiomegaly. Atherosclerotic calcification of the aortic knob. Progressive bilateral perihilar interstitial opacities. Lung fields are hyperexpanded with flattening of the diaphragms. No pleural effusion or pneumothorax. IMPRESSION: Progressive bilateral perihilar interstitial opacities, with appearance favoring worsening pulmonary edema over atypical/viral infection. Electronically Signed   By: Davina Poke D.O.   On: 05/12/2020 10:36   ECHOCARDIOGRAM COMPLETE  Result Date: 05/12/2020    ECHOCARDIOGRAM REPORT   Patient Name:   Bryan Carney Date of Exam: 05/12/2020 Medical Rec #:  [id number]     Height:       66.0 in Accession #:    [id number]    Weight:       121.0 lb Date of Birth:  [birth year]     BSA:          1.615 m Patient Age:    79 years      BP:           130/57 mmHg Patient Gender: M             HR:           68 bpm. Exam Location:  Inpatient Procedure: 2D Echo, Cardiac Doppler and Color  Doppler Indications:    CHF-Acute Systolic 008.67 / Y19.50  History:        Patient has prior history of Echocardiogram examinations, most                 recent 03/04/2019. CAD; Risk Factors:Hypertension, Diabetes,                 Dyslipidemia and Non-Smoker. Pulmonary embolism.  Sonographer:    Vickie Epley RDCS Referring Phys: [place]  1. Left ventricular ejection fraction, by estimation, is 30 to 35%. The left ventricle has moderately decreased function. The left ventricle demonstrates global hypokinesis. Left ventricular diastolic parameters are consistent with Grade III diastolic dysfunction (restrictive). Elevated left atrial pressure.  2. Right ventricular systolic function is moderately reduced. The right ventricular size is moderately enlarged. There is severely elevated pulmonary artery systolic pressure. The estimated right ventricular systolic pressure is 93.2 mmHg.  3. Left atrial size was severely dilated.  4. Right atrial size was mildly dilated.  5. The mitral valve is abnormal. Severe mitral valve regurgitation. Moderate mitral annular calcification.  6. The tricuspid valve is abnormal. Tricuspid valve regurgitation is severe.  7. The aortic valve is calcified. There is severe calcifcation of the aortic valve. Severe aortic valve stenosis.Vmax 3.9 m/s, MG 35 mmHg, AVA 0.8 cm^2, DI 0.20  8. The inferior vena cava is dilated in size with <50% respiratory variability, suggesting right atrial pressure of 15 mmHg. Conclusion(s)/Recommendation(s): Compared to prior, LV systolic function has worsened and now with severe MR, TR, and AS. FINDINGS  Left Ventricle: Left ventricular ejection fraction, by estimation, is 30 to 35%. The left ventricle has moderately decreased function. The left ventricle demonstrates global hypokinesis. The left ventricular internal cavity size was normal in size. There is no left ventricular hypertrophy. Left ventricular diastolic parameters are  consistent with Grade III diastolic dysfunction (restrictive). Elevated left atrial pressure. Right Ventricle: The right ventricular size is moderately  enlarged. Right vetricular wall thickness was not assessed. Right ventricular systolic function is moderately reduced. There is severely elevated pulmonary artery systolic pressure. The tricuspid regurgitant velocity is 3.86 m/s, and with an assumed right atrial pressure of 15 mmHg, the estimated right ventricular systolic pressure is 64.4 mmHg. Left Atrium: Left atrial size was severely dilated. Right Atrium: Right atrial size was mildly dilated. Pericardium: There is no evidence of pericardial effusion. Mitral Valve: The mitral valve is abnormal. Moderate mitral annular calcification. Severe mitral valve regurgitation. Tricuspid Valve: The tricuspid valve is abnormal. Tricuspid valve regurgitation is severe. Aortic Valve: The aortic valve is calcified. There is severe calcifcation of the aortic valve. Aortic valve regurgitation is trivial. Severe aortic stenosis is present. Aortic valve mean gradient measures 28.0 mmHg. Aortic valve peak gradient measures 47.9 mmHg. Aortic valve area, by VTI measures 0.91 cm. Pulmonic Valve: The pulmonic valve was not well visualized. Pulmonic valve regurgitation is not visualized. Aorta: The aortic root is normal in size and structure. Venous: The inferior vena cava is dilated in size with less than 50% respiratory variability, suggesting right atrial pressure of 15 mmHg. IAS/Shunts: The interatrial septum was not well visualized.  LEFT VENTRICLE PLAX 2D LVIDd:         5.40 cm  Diastology LVIDs:         4.90 cm  LV e' medial:    5.19 cm/s LV PW:         0.80 cm  LV E/e' medial:  25.6 LV IVS:        0.80 cm  LV e' lateral:   7.46 cm/s LVOT diam:     2.30 cm  LV E/e' lateral: 17.8 LV SV:         71 LV SV Index:   44 LVOT Area:     4.15 cm                          3D Volume EF:                         3D EF:        37 %                          LV EDV:       205 ml                         LV ESV:       130 ml                         LV SV:        75 ml RIGHT VENTRICLE RV S prime:     5.84 cm/s TAPSE (M-mode): 1.1 cm LEFT ATRIUM              Index       RIGHT ATRIUM           Index LA diam:        5.30 cm  3.28 cm/m  RA Area:     19.80 cm LA Vol (A2C):   114.0 ml 70.58 ml/m RA Volume:   56.50 ml  34.98 ml/m LA Vol (A4C):   60.3 ml  37.33 ml/m LA Biplane Vol: 89.6 ml  55.47 ml/m  AORTIC VALVE AV Area (Vmax):  0.94 cm AV Area (Vmean):   0.94 cm AV Area (VTI):     0.91 cm AV Vmax:           346.00 cm/s AV Vmean:          245.000 cm/s AV VTI:            0.782 m AV Peak Grad:      47.9 mmHg AV Mean Grad:      28.0 mmHg LVOT Vmax:         78.30 cm/s LVOT Vmean:        55.600 cm/s LVOT VTI:          0.171 m LVOT/AV VTI ratio: 0.22  AORTA Ao Root diam: 3.60 cm MITRAL VALVE                 TRICUSPID VALVE MV Area (PHT): 6.54 cm      TR Peak grad:   59.6 mmHg MV Decel Time: 116 msec      TR Vmax:        386.00 cm/s MR Peak grad:    132.9 mmHg MR Mean grad:    84.0 mmHg   SHUNTS MR Vmax:         576.50 cm/s Systemic VTI:  0.17 m MR Vmean:        432.0 cm/s  Systemic Diam: 2.30 cm MR PISA:         1.01 cm MR PISA Eff ROA: 7 mm MR PISA Radius:  0.40 cm MV E velocity: 133.00 cm/s MV A velocity: 28.40 cm/s MV E/A ratio:  4.68 Oswaldo Milian MD Electronically signed by Oswaldo Milian MD Signature Date/Time: 05/12/2020/10:43:00 PM    Final     Cardiac Studies   Echocardiogram 05/12/2020:  1. Left ventricular ejection fraction, by estimation, is 30 to 35%. The  left ventricle has moderately decreased function. The left ventricle  demonstrates global hypokinesis. Left ventricular diastolic parameters are  consistent with Grade III diastolic  dysfunction (restrictive). Elevated left atrial pressure.  2. Right ventricular systolic function is moderately reduced. The right  ventricular size is moderately enlarged. There is  severely elevated  pulmonary artery systolic pressure. The estimated right ventricular  systolic pressure is 16.0 mmHg.  3. Left atrial size was severely dilated.  4. Right atrial size was mildly dilated.  5. The mitral valve is abnormal. Severe mitral valve regurgitation.  Moderate mitral annular calcification.  6. The tricuspid valve is abnormal. Tricuspid valve regurgitation is  severe.  7. The aortic valve is calcified. There is severe calcifcation of the  aortic valve. Severe aortic valve stenosis.Vmax 3.9 m/s, MG 35 mmHg, AVA  0.8 cm^2, DI 0.20  8. The inferior vena cava is dilated in size with <50% respiratory  variability, suggesting right atrial pressure of 15 mmHg.   Conclusion(s)/Recommendation(s): Compared to prior, LV systolic function  has worsened and now with severe MR, TR, and AS.   Patient Profile     79 y.o. male CAD CABG carotid endarterectomy diabetes paroxysmal A. fib on Eliquis prior PE with chronic systolic heart failure EF 30-35, severe aortic stenosis severe mitral regurgitation severe tricuspid regurgitation severe pulmonary hypertension with enlarged right ventricle.  Assessment & Plan   Acute on chronic systolic heart failure with severe aortic stenosis and severe mitral regurgitation and severe tricuspid regurgitation -Gentle diuresis with goal-directed therapy for underlying heart failure.  -2.3 out via urine yesterday.  2.7 the day before.  Excellent.  Feels much better.  Less crackles -Creatinine has bumped from 1.6 to up to 2.15.  We will change to p.o Lasix 20 mg once a day. -We will continue low-dose angiotensin receptor blocker. -Eventually structural heart team evaluation for valvular issues this will be set up as outpatient. -MR and TR partially functional from underlying cardiomyopathy which is exacerbated by his aortic stenosis.  Hyperlipidemia -Change to Crestor 10 mg once a day.  Watch for any signs of myalgias.  Paroxysmal atrial  fibrillation with pacemaker -Currently sinus rhythm on amiodarone.  No changes made. -Atrial pacing noted on most recent ECG personally reviewed on 05/12/2020.  Chronic anticoagulation -Eliquis dose decreased to 2.5 twice a day given age 70 and less than 60 kg.   Ambulated the hallway very well.  He works 6 days a week.  Okay from my standpoint for discharge with close follow-up next week, basic metabolic profile.     For questions or updates, please contact [place] Please consult [email or web address] for contact info under        Signed, Candee Furbish, MD  05/14/2020, 9:08 AM

## 2020-05-14 NOTE — Progress Notes (Signed)
IV removal well tolerated tip intact, belongings returned to patient, discharge instructions reviewed with patient, his wife, and daughter.  Patient to be transported by family car to home.

## 2020-05-14 NOTE — Progress Notes (Addendum)
Family Medicine Teaching Service Daily Progress Note Intern Pager: [phone]  Patient name: JAMAREON SHIMEL Medical record number: [id number] Date of birth: Apr 11, 1941 Age: 79 y.o. Gender: male  Primary Care Provider: [name], NP Consultants: Cardiology Code Status: FULL  Pt Overview and Major Events to Date:  11/18: admitted 11/19: cardiology consulted  Assessment and Plan:  PAYMON ROSENSTEEL an 79 y.o.malewho presented with chest tightness andblood tinged sputum concerning for acute heart failure exacerbation. PMH is significant forCAD s/p CABG, paroxysmal A. fib s/p pacemaker, HTN, T2DM, CKD stage III, aortic valve stenosis, tachy-brady syndrome, history of prostate cancer and squamous cell cancer of the scalp.  Acute Dyspnea 2/2 Acute CHF Exacerbation Denies SOB this morning. Stable on 2L overnight. 2.3L urine output over past 24h (net negative 3.7L since admission). Echo 11/18 showed worsening EF (30-35%), Grade 3 DD, and severe AS, MR, and TR. -Cardiology following, appreciate recommendations -20mg  IV Lasix BID per cardiology, may need adjusting today due to renal function -Metoprolol 12.5mg  per cardiology (d/c'd cardizem) -Strict Is/Os -Daily weights -Will wean oxygen. Nursing was under the impression patient was on home O2, but this is not the case.  Severe Aortic Stenosis, Mitral Regurgitation, and Tricuspid Regurgitation Echo 11/18 showed severe AS, MR, and TR. -Cardiology following, appreciate recommendations -Evaluation by structural heart team per cardiology  Macrocytic Anemia Hgb stable (8.8 this morning). Unclear etiology- possible iron deficiency given ron 32, ferritin 149, TIBC 330, saturation ratio 10. However, macrocytosis unusual for iron deficiency. B12 and Folate labs wnl. Peripheral smear pending. -Outpatient GI follow up -Needs anemia workup prior to aortic valve treatment per cardiology -Transfusion threshold <8 -Continue Eliquis and ASA, monitor Hgb  closely -Daily CBC -Will trial ferrous sulfate daily  AKI on CKD Stage III Cr 2.15 this morning, from 1.69 yesterday (Baseline 1.7). -Diuresis per cardiology, may need to decrease today -Daily BMP -Avoid nephrotoxic agents  CAD s/p CABG  Chronic, stable. Home meds: Eliquis 5mg  BID and ASA 81mg  -Eliquis 2.5mg  BID -Continue ASA 81mg  daily  Paroxysmal A-fib s/p pacemaker Chronic, stable. Not in a-fib. Home meds: Eliquis 5mg , ASA 81mg , diltiazem 240mg . -Eliquis 2.5mg  BID, ASA 81mg  daily -Metoprolol 12.5mg  daily per cardiology (d/c home cardizem due to HFrEF) -Continue Amiodarone 200mg  daily  HTN BP has been wnl over past 24h.   -Continue to monitor -Losartan 25mg  daily per cardiology (d/c'd home benazepril)  T2DM Fasting glucose 134 this am. On Glimepiride 4mg  and Tradjenta 5mg  daily at home. -Continue home meds -Consider SGLT2 given hx of CHF  HLD Chronic, stable. On Simvastatin 10mg  daily at home. -Continue home Simvastatin  GERD Chronic, stable. On Protonix 40mg  BID at home -Continue home Protonix  FEN/GI: Heart healthy, carb modified diet PPx: Eliquis   Status is: Inpatient Remains inpatient appropriate because:Ongoing diagnostic testing needed not appropriate for outpatient work up   Dispo: The patient is from: Home              Anticipated d/c is to: Home              Anticipated d/c date is: 1 day              Patient currently is not medically stable to d/c.    Subjective:  No acute events overnight. Patient denies complaints this morning and is asking when he can be dismissed from the hospital.   Objective: Temp:  [97.7 F (36.5 C)-98.3 F (36.8 C)] 98.1 F (36.7 C) (11/20 0420) Pulse Rate:  [60-65] 62 (  11/20 0400) Resp:  [16-18] 16 (11/20 0400) BP: (104-124)/(55-69) 109/62 (11/20 0400) SpO2:  [92 %-98 %] 95 % (11/20 0400) Weight:  [54.7 kg] 54.7 kg (11/20 0500) Physical Exam: General: alert, elderly gentleman, NAD Cardiovascular: RRR, III/VI  crescendo-decrescendo murmur heard at aortic, pulmonic, tricuspid and mitral positions Respiratory: normal WOB, lungs CTAB Abdomen: soft, nontender, nondistended Extremities: no peripheral edema  Laboratory: Recent Labs  Lab 05/12/20 1006 05/13/20 0057 05/14/20 0400  WBC 10.7* 9.9 8.1  HGB 8.1* 8.3* 8.8*  HCT 26.2* 25.4* 26.7*  PLT 285 285 289   Recent Labs  Lab 05/12/20 1006 05/13/20 0057 05/14/20 0400  NA 138 138 136  K 4.6 3.6 3.7  CL 105 102 99  CO2 25 27 28   BUN 21 20 32*  CREATININE 1.62* 1.69* 2.15*  CALCIUM 8.8* 8.8* 8.5*  PROT  --   --  5.7*  BILITOT  --   --  1.2  ALKPHOS  --   --  81  ALT  --   --  26  AST  --   --  22  GLUCOSE 262* 159* 134*    Imaging/Diagnostic Tests: No new imaging/diagnostic tests.   Alcus Dad, MD 05/14/2020, 5:57 AM PGY-1, [place] Intern pager: [phone], text pages welcome

## 2020-05-16 LAB — PATHOLOGIST SMEAR REVIEW

## 2020-05-16 LAB — GLUCOSE, CAPILLARY: Glucose-Capillary: 248 mg/dL — ABNORMAL HIGH (ref 70–99)

## 2020-05-16 NOTE — Discharge Summary (Addendum)
[facility] Discharge Summary  Patient name: Bryan Carney Medical record number: [id number] Date of birth: [birth year] Age: 79 y.o. Gender: male Date of Admission: 05/12/2020  Date of Discharge: 05/14/2020 Admitting Physician: Lind Covert, MD  Primary Care Provider: [name], NP Consultants: Cardiology  Indication for Hospitalization: Acute heart failure exacerbation  Discharge Diagnoses/Problem List:  Acute CHF Exacerbation  Severe valvular disease CAD s/p CABG Paroxysmal A-fib s/p pacemaker Macrocytic Anemia AKI on CKD Stage III Hypertension Type 2 diabetes mellitus Hyperlipidemia GERD  Disposition: Home  Discharge Condition: Stable  Discharge Exam:   Temp:  [97.7 F (36.5 C)-98.3 F (36.8 C)] 98.1 F (36.7 C) (11/20 0420) Pulse Rate:  [60-65] 62 (11/20 0400) Resp:  [16-18] 16 (11/20 0400) BP: (104-124)/(55-69) 109/62 (11/20 0400) SpO2:  [92 %-98 %] 95 % (11/20 0400) Weight:  [54.7 kg] 54.7 kg (11/20 0500) Physical Exam: General: alert, elderly gentleman, NAD Cardiovascular: RRR, III/VI crescendo-decrescendo murmur heard at aortic, pulmonic, tricuspid and mitral positions Respiratory: normal WOB, lungs CTAB Abdomen: soft, nontender, nondistended Extremities: no peripheral edema  Brief Hospital Course:  Bryan Carney is an 79 y.o. male who presented with chest tightness concerning for acute heart failure exacerbation. PMH is significant for CAD s/p CABG, paroxysmal A. fib s/p pacemaker, HTN, T2DM, CKD stage III, aortic valve stenosis, tachy-brady syndrome, history of prostate cancer and squamous cell cancer of the scalp.   Acute CHF Exacerbation  Severe valvular disease  CAD s/p CABG  Paroxysmal A-fib s/p pacemaker Bryan Carney presented to ED with complaint of 2-day history of chest tightness and cough.  Physical exam revealed dyspnea, SPO2 91% on room air that increased to 97% on 3 L nasal cannula.  Chest x-ray suggestive of  pulmonary edema.  BNP of 1805.1.  Troponins negative x2.  Differential included acute heart failure exacerbation and pulmonary embolism.  Despite personal history of PE, PE less likely due to being on full anticoagulation with aspirin and Eliquis, no signs or symptoms of DVT, and wells score of 2.5.  He was given initial diuresis with IV Lasix in ED and was admitted.  Echo on 11/18 demonstrated LVEF 30 to 35%, LV global hypokinesis, grade 3 diastolic dysfunction, severely elevated pulmonary artery systolic pressure, severe regurg of mitral and tricuspid valves, also severe calcification of aortic valve. Cardiology was consulted and recommended gentle diuresis.  Net -3,745 mL fluid reduction during admission. Symptom resolution and physical exam improvement after diuresis. Close cardiology follow up recommended. Cardiology also made changes as noted below: -Simvastatin changed to Crestor 10 mg once daily -Eliquis dose reduced to 2.5 mg twice daily given age of 24 years and less than 60 kg -Started Lasix 20 mg p.o. daily -ARB switched to losartan 25 mg  -Cardizem was discontinued  -Metoprolol was started; however, family noted past allergy to this medication so discontinued DC recommendations:  Consider starting SGLT-2, if affordable.  Macrocytic Anemia Partially worked up in the hospital: B12 and folate, TSH wnl. Patient denies ETOH use. Reticulocyte count 82, retic ct percent 2.3, IRF 2.66. Smear review is currently pending.   AKI on CKD Stage III Creatinine baseline believed to be 1.7.  Creatinine 1.69 upon admission, rose to 2.15 during admission while being diuresed.  Diuresis decreased after symptom and physical exam improvement.  Discharged on Lasix 20 mg p.o. daily.  Other chronic conditions stable during this admission: Hypertension Type 2 diabetes mellitus Hyperlipidemia GERD    Issues for Follow Up:  Consider starting  SLGT-2 inhibitor if affordable Follow up on possible myalgias duet  o switch from simvastatin to crestor Eliquis reduced to 2.5 mg, f/u on DVT symptoms ARB switched to losartan, monitor for side effects Macrocytic anemia - blood smear review pending upon discharge.  AKI on CKD 3 - recheck creatinine given discharge medication of lasix 20 mg PO  Significant Procedures: None  Significant Labs and Imaging:  Recent Labs  Lab 05/12/20 1006 05/13/20 0057 05/14/20 0400  WBC 10.7* 9.9 8.1  HGB 8.1* 8.3* 8.8*  HCT 26.2* 25.4* 26.7*  PLT 285 285 289   Recent Labs  Lab 05/12/20 1006 05/12/20 1006 05/13/20 0057 05/14/20 0400  NA 138  --  138 136  K 4.6   < > 3.6 3.7  CL 105  --  102 99  CO2 25  --  27 28  GLUCOSE 262*  --  159* 134*  BUN 21  --  20 32*  CREATININE 1.62*  --  1.69* 2.15*  CALCIUM 8.8*  --  8.8* 8.5*  ALKPHOS  --   --   --  81  AST  --   --   --  22  ALT  --   --   --  26  ALBUMIN  --   --   --  2.7*   < > = values in this interval not displayed.   Chest x-ray 2 view 05/10/2020 FINDINGS: Stable positioning of left-sided implanted cardiac device. Post CABG changes. Stable cardiomegaly. Atherosclerotic calcification of the aortic knob. Progressive bilateral perihilar interstitial opacities. Lung fields are hyperexpanded with flattening of the diaphragms. No pleural effusion or pneumothorax. IMPRESSION: Progressive bilateral perihilar interstitial opacities, with appearance favoring worsening pulmonary edema over atypical/viral infection.  Complete echo 05/12/2020 IMPRESSIONS   1. Left ventricular ejection fraction, by estimation, is 30 to 35%. The  left ventricle has moderately decreased function. The left ventricle  demonstrates global hypokinesis. Left ventricular diastolic parameters are  consistent with Grade III diastolic  dysfunction (restrictive). Elevated left atrial pressure.   2. Right ventricular systolic function is moderately reduced. The right  ventricular size is moderately enlarged. There is severely  elevated  pulmonary artery systolic pressure. The estimated right ventricular  systolic pressure is 67.8 mmHg.   3. Left atrial size was severely dilated.   4. Right atrial size was mildly dilated.   5. The mitral valve is abnormal. Severe mitral valve regurgitation.  Moderate mitral annular calcification.   6. The tricuspid valve is abnormal. Tricuspid valve regurgitation is  severe.   7. The aortic valve is calcified. There is severe calcifcation of the  aortic valve. Severe aortic valve stenosis.Vmax 3.9 m/s, MG 35 mmHg, AVA  0.8 cm^2, DI 0.20   8. The inferior vena cava is dilated in size with <50% respiratory  variability, suggesting right atrial pressure of 15 mmHg.    Results/Tests Pending at Time of Discharge:  Blood smear review   Discharge Medications:  Allergies as of 05/14/2020       Reactions   Lopressor [metoprolol Tartrate] Other (See Comments)   Severe BP drop   Lipitor [atorvastatin]    Muscle aches        Medication List     STOP taking these medications    benazepril 40 MG tablet Commonly known as: LOTENSIN   diltiazem 240 MG 24 hr capsule Commonly known as: CARDIZEM CD   simvastatin 20 MG tablet Commonly known as: ZOCOR       TAKE these  medications    amiodarone 200 MG tablet Commonly known as: PACERONE TAKE ONE TABLET BY MOUTH DAILY--please make overdue follow up appt for further refills [phone] 1st attempt What changed: See the new instructions.   apixaban 2.5 MG Tabs tablet Commonly known as: ELIQUIS Take 1 tablet (2.5 mg total) by mouth 2 (two) times daily. What changed:  medication strength how much to take   aspirin EC 81 MG tablet Take 81 mg by mouth daily.   cetirizine 10 MG tablet Commonly known as: ZYRTEC Take 10 mg by mouth daily.   ELDERBERRY PO Take 300 mg by mouth daily.   ferrous sulfate 325 (65 FE) MG tablet Take 325 mg by mouth 2 (two) times daily with a meal.   furosemide 20 MG tablet Commonly known  as: LASIX Take 1 tablet (20 mg total) by mouth daily.   glimepiride 4 MG tablet Commonly known as: AMARYL Take 1 tablet (4 mg total) by mouth daily with breakfast.   linagliptin 5 MG Tabs tablet Commonly known as: TRADJENTA Take 1 tablet (5 mg total) by mouth daily.   losartan 25 MG tablet Commonly known as: COZAAR Take 1 tablet (25 mg total) by mouth daily.   pantoprazole 40 MG tablet Commonly known as: PROTONIX Take 40 mg by mouth daily.   rosuvastatin 10 MG tablet Commonly known as: CRESTOR Take 1 tablet (10 mg total) by mouth daily.        Discharge Instructions: Please refer to Patient Instructions section of EMR for full details.  Patient was counseled important signs and symptoms that should prompt return to medical care, changes in medications, dietary instructions, activity restrictions, and follow up appointments.   Follow-Up Appointments:    Ezequiel Essex, MD 05/16/2020, 7:20 PM PGY-1, [facility] Upper-Level Resident Addendum I have discussed the above with the original author and agree with their documentation. My edits for correction/addition/clarification are included. Please see also any attending notes.   Wilber Oliphant, M.D.  PGY-3 05/17/2020 8:31 AM

## 2020-05-17 ENCOUNTER — Telehealth: Payer: Self-pay

## 2020-05-17 ENCOUNTER — Other Ambulatory Visit: Payer: Self-pay

## 2020-05-17 DIAGNOSIS — N189 Chronic kidney disease, unspecified: Secondary | ICD-10-CM | POA: Diagnosis not present

## 2020-05-17 DIAGNOSIS — I38 Endocarditis, valve unspecified: Secondary | ICD-10-CM | POA: Diagnosis not present

## 2020-05-17 DIAGNOSIS — D539 Nutritional anemia, unspecified: Secondary | ICD-10-CM | POA: Diagnosis not present

## 2020-05-17 DIAGNOSIS — Z6820 Body mass index (BMI) 20.0-20.9, adult: Secondary | ICD-10-CM | POA: Diagnosis not present

## 2020-05-17 DIAGNOSIS — I509 Heart failure, unspecified: Secondary | ICD-10-CM | POA: Diagnosis not present

## 2020-05-17 DIAGNOSIS — Z79899 Other long term (current) drug therapy: Secondary | ICD-10-CM | POA: Diagnosis not present

## 2020-05-17 NOTE — Telephone Encounter (Signed)
Per Dr. Johney Frame, scheduled the patient tomorrow with Dr. Burt Knack for evaluation of AS/MR. His daughter (DPR who will come with him to visit) was grateful for call and agrees with plan.

## 2020-05-17 NOTE — Patient Outreach (Signed)
[facility]) Care Management  05/17/2020  Bryan Carney [birth year] [id number]    EMMI-General Discharge RED ON EMMI ALERT Day # 1 Date: 05/16/2020 Red Alert Reason: "Other questions/problems? Yes"   Outreach attempt # 1 to patient. Spoke with daughter([name]-DPR on file). She reports that patient is currently at work with her. He has been doing well since returning home. He has his MD follow up appts in place.Reviewed and addressed red alert. Daughter states that machine misunderstood her and recorded response inaccurately. She denies any questions/problems or issues at present. Patient has all his meds and no needs or concerns at this time. Advised caregiver that they would get one more automated EMMI-GENERAL post discharge calls to assess how they are doing following recent hospitalization and will receive a call from a nurse if any of their responses were abnormal.She voiced understanding and was appreciative of f/u call.     Plan: RN CM will close case at this time.   Enzo Montgomery, RN,BSN,CCM [facility] Telephonic Care Management Coordinator Direct Phone: [phone] Toll Free: [phone] Fax: [phone]

## 2020-05-17 NOTE — Telephone Encounter (Signed)
The patient called the office during his appointment to ask why he had not been called as he thought he had a virtual visit. He states his dialysis schedule changed, he has trouble walking and he cannot drive. Discussed with Dr. Acie Fredrickson who will have a telephone visit with the patient. Consent obtained.    Patient Consent for Virtual Visit      y   Bryan Carney has provided verbal consent on 05/17/2020 for a virtual visit (video or telephone).   CONSENT FOR VIRTUAL VISIT FOR:  Bryan Carney  By participating in this virtual visit I agree to the following:  I hereby voluntarily request, consent and authorize [facility] and its employed or contracted physicians, physician assistants, nurse practitioners or other licensed health care professionals (the Practitioner), to provide me with telemedicine health care services (the "Services") as deemed necessary by the treating Practitioner. I acknowledge and consent to receive the Services by the Practitioner via telemedicine. I understand that the telemedicine visit will involve communicating with the Practitioner through live audiovisual communication technology and the disclosure of certain medical information by electronic transmission. I acknowledge that I have been given the opportunity to request an in-person assessment or other available alternative prior to the telemedicine visit and am voluntarily participating in the telemedicine visit.  I understand that I have the right to withhold or withdraw my consent to the use of telemedicine in the course of my care at any time, without affecting my right to future care or treatment, and that the Practitioner or I may terminate the telemedicine visit at any time. I understand that I have the right to inspect all information obtained and/or recorded in the course of the telemedicine visit and may receive copies of available information for a reasonable fee.  I understand that some of the potential  risks of receiving the Services via telemedicine include:  Marland Kitchen Delay or interruption in medical evaluation due to technological equipment failure or disruption; . Information transmitted may not be sufficient (e.g. poor resolution of images) to allow for appropriate medical decision making by the Practitioner; and/or  . In rare instances, security protocols could fail, causing a breach of personal health information.  Furthermore, I acknowledge that it is my responsibility to provide information about my medical history, conditions and care that is complete and accurate to the best of my ability. I acknowledge that Practitioner's advice, recommendations, and/or decision may be based on factors not within their control, such as incomplete or inaccurate data provided by me or distortions of diagnostic images or specimens that may result from electronic transmissions. I understand that the practice of medicine is not an exact science and that Practitioner makes no warranties or guarantees regarding treatment outcomes. I acknowledge that a copy of this consent can be made available to me via my patient portal ([facility]), or I can request a printed copy by calling the office of [facility].    I understand that my insurance will be billed for this visit.   I have read or had this consent read to me. . I understand the contents of this consent, which adequately explains the benefits and risks of the Services being provided via telemedicine.  . I have been provided ample opportunity to ask questions regarding this consent and the Services and have had my questions answered to my satisfaction. . I give my informed consent for the services to be provided through the use of telemedicine in my medical care

## 2020-05-18 ENCOUNTER — Ambulatory Visit: Payer: Medicare HMO | Admitting: Cardiovascular Disease

## 2020-05-18 ENCOUNTER — Other Ambulatory Visit: Payer: Self-pay

## 2020-05-18 ENCOUNTER — Encounter: Payer: Self-pay | Admitting: Cardiovascular Disease

## 2020-05-18 VITALS — BP 130/56 | HR 71 | Ht 64.0 in | Wt 122.4 lb

## 2020-05-18 DIAGNOSIS — I350 Nonrheumatic aortic (valve) stenosis: Secondary | ICD-10-CM | POA: Diagnosis not present

## 2020-05-18 DIAGNOSIS — I5023 Acute on chronic systolic (congestive) heart failure: Secondary | ICD-10-CM | POA: Diagnosis not present

## 2020-05-18 DIAGNOSIS — J449 Chronic obstructive pulmonary disease, unspecified: Secondary | ICD-10-CM | POA: Diagnosis not present

## 2020-05-18 NOTE — Addendum Note (Signed)
Addended by: Harland German A on: 05/18/2020 04:32 PM   Modules accepted: Level of Service, SmartSet

## 2020-05-18 NOTE — Patient Instructions (Signed)
COVID SCREENING INFORMATION (12/2): You are scheduled for your drive-thru COVID screening on Thursday, 05/26/2020 between 8AM and 9AM. Pre-Procedural COVID-19 Testing Site [address] [place], [place] [address] You will need to go home after your screening and quarantine until your procedure.  TEE and HEART CATH INSTRUCTIONS (12/3): You will be scheduled for a heart catheterization and TEE on Friday, December 3.  1. Please arrive at the [facility] (Main Entrance A) at [facility]: [address] [place], [place] [address]. You will be called with your arrival time. You are allowed ONE visitor in the waiting room during your procedure. Both you and your guest must wear masks. Special note: Every effort is made to have your procedure done on time. Please understand that emergencies sometimes delay scheduled procedures.  2. Diet: Do not eat or drink anything after midnight the night before your procedures.  3. Medication instructions in preparation for your procedure:  1) HOLD AMARYL the AM of your procedures  2) HOLD TRADJENTA the AM of your procedures  3) HOLD LOSARTAN the day before and morning of your procedures  4) STOP ELIQUIS after your 11/30 PM dose.  5) MAKE SURE TO TAKE YOUR ASPIRIN the morning of your procedures  6) You may take your other medications with sips of water   4. Plan for one night stay--bring personal belongings. 5. Bring a current list of your medications and current insurance cards. 6. You MUST have a responsible person to drive you home. 7. Someone MUST be with you the first 24 hours after you arrive home or your discharge will be delayed. 8. Please wear clothes that are easy to get on and off and wear slip-on shoes.  Thank you for allowing [name] to care for you!   -- [facility]

## 2020-05-18 NOTE — H&P (View-Only) (Signed)
[facility]   MULTIDISCIPLINARY HEART VALVE TEAM  Date:  05/20/2020   ID:  Bryan Carney, DOB 1941/03/14, MRN [id number]  PCP:  Bryan Dandy, NP   Chief Complaint  Patient presents with  . Shortness of Breath     HISTORY OF PRESENT ILLNESS: Bryan Carney is a 79 y.o. male who presents for evaluation of valvular heart disease, referred by Dr Bryan Carney.   The patient is here with his daughter today. 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 The patient still goes to work 6 days/week.   He was recently hospitalized 11/18-11/20/21 with acute on chronic systolic heart failure. He reports a longstanding heart murmur and he has been followed over time with serial echo studies. He developed slowly progressive shortness of breath over a period of about 5 weeks.  He became extremely short of breath at rest and required hospitalization for IV diuresis.  He improved rapidly and was only hospitalized for about 48 hours.  The patient's echocardiogram demonstrated severe aortic stenosis, severe mitral regurgitation, and severe tricuspid regurgitation.  Since hospital discharge he has felt better with improved breathing, no orthopnea, no abdominal swelling, and no leg swelling.  He underwent PPM in 2018. He underwent CABG in 1999. The patient had a stroke in 1996 and underwent left carotid endarterectomy at that time.   The patient has had regular dental care in the past and reports full upper and lower dentures.  Past Medical History:  Diagnosis Date  . Anemia   . Anginal pain ([place])   . Arthritis   . CAD (coronary artery disease)   . CHF (congestive heart failure) ([place])   . Chronic kidney disease   . COPD (chronic obstructive pulmonary disease) ([place])   . Diabetes ([place])   . Dyspnea   . Dysrhythmia   . Fibromyalgia    25 years ago.  Marland Kitchen Heart murmur   . Hx pulmonary  embolism   . Hyperlipidemia   . Hypertension   . Myocardial infarction ([place])   . Pneumonia   . Presence of permanent cardiac pacemaker   . Prostate cancer ([place])   . PVOD (pulmonary veno-occlusive disease) ([place])   . Squamous cell carcinoma   . Stroke [facility])    22 years ago no deficits noted.    Current Outpatient Medications  Medication Sig Dispense Refill  . amiodarone (PACERONE) 200 MG tablet TAKE ONE TABLET BY MOUTH DAILY--please make overdue follow up appt for further refills [phone] 1st attempt 90 tablet 3  . apixaban (ELIQUIS) 2.5 MG TABS tablet Take 1 tablet (2.5 mg total) by mouth 2 (two) times daily. 60 tablet 0  . aspirin EC 81 MG tablet Take 81 mg by mouth daily.    . cetirizine (ZYRTEC) 10 MG tablet Take 10 mg by mouth daily.    Marland Kitchen ELDERBERRY PO Take 300 mg by mouth daily.    . ferrous sulfate 325 (65 FE) MG tablet Take 325 mg by mouth 2 (two) times daily with a meal.     . furosemide (LASIX) 20 MG tablet Take 1 tablet (20 mg total) by mouth daily. 30 tablet 0  . glimepiride (AMARYL) 4 MG tablet Take 1 tablet (4 mg total) by mouth daily with breakfast. 30 tablet 0  . linagliptin (TRADJENTA) 5 MG TABS tablet Take 1 tablet (5 mg total) by mouth daily. 30 tablet 0  .  losartan (COZAAR) 25 MG tablet Take 1 tablet (25 mg total) by mouth daily. 30 tablet 0  . pantoprazole (PROTONIX) 40 MG tablet Take 40 mg by mouth daily.    . rosuvastatin (CRESTOR) 10 MG tablet Take 1 tablet (10 mg total) by mouth daily. 30 tablet 0   No current facility-administered medications for this visit.    ALLERGIES:   Lopressor [metoprolol tartrate] and Lipitor [atorvastatin]   SOCIAL HISTORY:  The patient  reports that he has never smoked. He has never used smokeless tobacco. He reports that he does not drink alcohol and does not use drugs.   FAMILY HISTORY:  The patient's family history includes Cancer in his sister and sister; Diabetes in his brother, brother, brother, and brother; Heart attack  in his father.   REVIEW OF SYSTEMS:  Positive for fatigue.   All other systems are reviewed and negative.   PHYSICAL EXAM: VS:  BP (!) 130/56   Pulse 71   Ht 5\' 4"  (1.626 m)   Wt 122 lb 6.4 oz (55.5 kg)   SpO2 97%   BMI 21.01 kg/m  , BMI Body mass index is 21.01 kg/m. GEN: Elderly male, in no acute distress HEENT: normal Neck: No JVD. carotids 2+ without bruits or masses Cardiac: The heart is RRR with a 3/6 late peaking harsh systolic murmur at the RUSB, absent A2, murmur heard throughout. No edema. Pedal pulses 2+ = bilaterally  Respiratory:  Diminished in the bases bilaterally GI: soft, nontender, nondistended, + BS MS: no deformity or atrophy Skin: warm and dry, no rash Neuro:  Strength and sensation are intact Psych: euthymic mood, full affect  RECENT LABS: 01/04/2020: TSH 1.060 05/12/2020: B Natriuretic Peptide 1,805.1 05/14/2020: ALT 26; BUN 32; Creatinine, Ser 2.15; Hemoglobin 8.8; Platelets 289; Potassium 3.7; Sodium 136  No results found for requested labs within last 8760 hours.   Estimated Creatinine Clearance: 21.5 mL/min (A) (by C-G formula based on SCr of 2.15 mg/dL (H)).   Wt Readings from Last 3 Encounters:  05/18/20 122 lb 6.4 oz (55.5 kg)  05/14/20 120 lb 9.5 oz (54.7 kg)  05/02/20 137 lb (62.1 kg)     CARDIAC STUDIES:  Echo:  IMPRESSIONS    1. Left ventricular ejection fraction, by estimation, is 30 to 35%. The  left ventricle has moderately decreased function. The left ventricle  demonstrates global hypokinesis. Left ventricular diastolic parameters are  consistent with Grade III diastolic  dysfunction (restrictive). Elevated left atrial pressure.  2. Right ventricular systolic function is moderately reduced. The right  ventricular size is moderately enlarged. There is severely elevated  pulmonary artery systolic pressure. The estimated right ventricular  systolic pressure is 10.2 mmHg.  3. Left atrial size was severely dilated.  4. Right  atrial size was mildly dilated.  5. The mitral valve is abnormal. Severe mitral valve regurgitation.  Moderate mitral annular calcification.  6. The tricuspid valve is abnormal. Tricuspid valve regurgitation is  severe.  7. The aortic valve is calcified. There is severe calcifcation of the  aortic valve. Severe aortic valve stenosis.Vmax 3.9 m/s, MG 35 mmHg, AVA  0.8 cm^2, DI 0.20  8. The inferior vena cava is dilated in size with <50% respiratory  variability, suggesting right atrial pressure of 15 mmHg.   Conclusion(s)/Recommendation(s): Compared to prior, LV systolic function  has worsened and now with severe MR, TR, and AS.   LEFT VENTRICLE  PLAX 2D  LVIDd:     5.40 cm Diastology  LVIDs:  4.90 cm LV e' medial:  5.19 cm/s  LV PW:     0.80 cm LV E/e' medial: 25.6  LV IVS:    0.80 cm LV e' lateral:  7.46 cm/s  LVOT diam:   2.30 cm LV E/e' lateral: 17.8  LV SV:     71  LV SV Index:  44  LVOT Area:   4.15 cm               3D Volume EF:             3D EF:    37 %             LV EDV:    205 ml             LV ESV:    130 ml             LV SV:    75 ml   RIGHT VENTRICLE  RV S prime:   5.84 cm/s  TAPSE (M-mode): 1.1 cm   LEFT ATRIUM       Index    RIGHT ATRIUM      Index  LA diam:    5.30 cm 3.28 cm/m RA Area:   19.80 cm  LA Vol (A2C):  114.0 ml 70.58 ml/m RA Volume:  56.50 ml 34.98 ml/m  LA Vol (A4C):  60.3 ml 37.33 ml/m  LA Biplane Vol: 89.6 ml 55.47 ml/m  AORTIC VALVE  AV Area (Vmax):  0.94 cm  AV Area (Vmean):  0.94 cm  AV Area (VTI):   0.91 cm  AV Vmax:      346.00 cm/s  AV Vmean:     245.000 cm/s  AV VTI:      0.782 m  AV Peak Grad:   47.9 mmHg  AV Mean Grad:   28.0 mmHg  LVOT Vmax:     78.30 cm/s  LVOT Vmean:    55.600 cm/s  LVOT VTI:     0.171 m  LVOT/AV VTI  ratio: 0.22    AORTA  Ao Root diam: 3.60 cm   MITRAL VALVE         TRICUSPID VALVE  MV Area (PHT): 6.54 cm   TR Peak grad:  59.6 mmHg  MV Decel Time: 116 msec   TR Vmax:    386.00 cm/s  MR Peak grad:  132.9 mmHg  MR Mean grad:  84.0 mmHg  SHUNTS  MR Vmax:     576.50 cm/s Systemic VTI: 0.17 m  MR Vmean:    432.0 cm/s Systemic Diam: 2.30 cm  MR PISA:     1.01 cm  MR PISA Eff ROA: 7 mm  MR PISA Radius: 0.40 cm  MV E velocity: 133.00 cm/s  MV A velocity: 28.40 cm/s  MV E/A ratio: 4.68   STS RISK CALCULATOR: Isolated AVR: Risk of Mortality: 14.045% Renal Failure: 23.518% Permanent Stroke: 2.182% Prolonged Ventilation: 31.127% DSW Infection: 0.227% Reoperation: 7.615% Morbidity or Mortality: 50.138% Short Length of Stay: 7.206% Long Length of Stay: 40.483%   ASSESSMENT AND PLAN: 1.  79 yo male with multiple medical problems outlined above who has developed acute on chronic combined systolic heart failure in the setting of severe, stage D2 aortic stenosis, severe mitral regurgitation, and severe tricuspid regurgitation (NYHA 3b).   I have reviewed the natural history of aortic stenosis with the patient and their family members who are present today. We have discussed the limitations of medical therapy and the poor prognosis  associated with symptomatic aortic stenosis. We have reviewed potential treatment options, including palliative medical therapy, conventional surgical aortic valve replacement, and transcatheter aortic valve replacement. We discussed treatment options in the context of the patient's specific comorbid medical conditions.    I have personally reviewed the patient's echo images.  This demonstrates severe calcification of the aortic valve leaflets with a fixed right coronary cusp and restricted mobility of the left and noncoronary cusps.  The mean transvalvular gradient is 36 mmHg and the dimensionless index is 0.22 in  the context of an reduced LVEF of 35%.  The patient has severe central mitral and tricuspid regurgitation.  I think he has severe, stage D2 low-flow low gradient aortic stenosis with reduced LVEF as well as severe mitral regurgitation.  I suspect his mitral regurgitation is functional but it is not clear from the surface echocardiogram.  A transesophageal echo study would better define the functional anatomy of the patient's mitral and tricuspid valves and give additional information about how he might respond to TAVR.  I reviewed the risks, indications, and alternatives of transesophageal echo with the patient and his daughter who was present today.  They agree to proceed.  We will also perform right and left heart catheterization to assess for graft patency and intracardiac hemodynamics.  The patient will be brought in early for fluid hydration and all attempts will be made to minimize contrast.  They understand the increased risk of acute kidney injury considering his recent creatinine levels have been in the range of 2.0 mg/dL.  I have reviewed the risks, indications, and alternatives to cardiac catheterization, possible angioplasty, and stenting with the patient. Risks include but are not limited to bleeding, infection, vascular injury, stroke, myocardial infection, arrhythmia, kidney injury, radiation-related injury in the case of prolonged fluoroscopy use, emergency cardiac surgery, and death. The patient understands the risks of serious complication is 1-2 in 2130 with diagnostic cardiac cath and 1-2% or less with angioplasty/stenting. Again, they understand the risk of renal deterioration is considerably higher than that quoted above because of baseline Stage 3b chronic kidney disease.  Finally, after transesophageal echo and cardiac catheterization studies are completed, the patient will need to undergo CT angiography of the chest, abdomen, and pelvis as well as a gated CT scan of the heart.  This study  will be done with renal protocol to limit contrast and reduce the risk of contrast nephropathy.  We discussed the overall risks and benefits of moving forward with evaluation of treatment options related to his valvular heart disease today. We also had specific discussion about a palliative approach to his care in the context of his age and comorbid conditions. As the patient remains functionally independent (still working and living independently with his wife), he clearly favors moving forward with treatment. While his risk of AKI and other complications is increased in the setting of comorbidities outlined above, he clearly has a very poor prognosis without aortic valve intervention.  Deatra James 05/20/2020 10:21 AM     [facility] [address] [place] [address]  [phone] (office) [phone] (fax)

## 2020-05-18 NOTE — Telephone Encounter (Signed)
This encounter was created in error - please disregard.

## 2020-05-18 NOTE — Progress Notes (Signed)
[facility]   MULTIDISCIPLINARY HEART VALVE TEAM  Date:  05/20/2020   ID:  Bryan Carney, DOB 1941-03-18, MRN [id number]  PCP:  [name], NP   Chief Complaint  Patient presents with  . Shortness of Breath     HISTORY OF PRESENT ILLNESS: Bryan Carney is a 79 y.o. male who presents for evaluation of valvular heart disease, referred by Dr Johney Frame.   The patient is here with his daughter today. 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 The patient still goes to work 6 days/week.   He was recently hospitalized 11/18-11/20/21 with acute on chronic systolic heart failure. He reports a longstanding heart murmur and he has been followed over time with serial echo studies. He developed slowly progressive shortness of breath over a period of about 5 weeks.  He became extremely short of breath at rest and required hospitalization for IV diuresis.  He improved rapidly and was only hospitalized for about 48 hours.  The patient's echocardiogram demonstrated severe aortic stenosis, severe mitral regurgitation, and severe tricuspid regurgitation.  Since hospital discharge he has felt better with improved breathing, no orthopnea, no abdominal swelling, and no leg swelling.  He underwent PPM in 2018. He underwent CABG in 1999. The patient had a stroke in 1996 and underwent left carotid endarterectomy at that time.   The patient has had regular dental care in the past and reports full upper and lower dentures.  Past Medical History:  Diagnosis Date  . Anemia   . Anginal pain ([place])   . Arthritis   . CAD (coronary artery disease)   . CHF (congestive heart failure) ([place])   . Chronic kidney disease   . COPD (chronic obstructive pulmonary disease) ([place])   . Diabetes ([place])   . Dyspnea   . Dysrhythmia   . Fibromyalgia    25 years ago.  Marland Kitchen Heart murmur   . Hx pulmonary  embolism   . Hyperlipidemia   . Hypertension   . Myocardial infarction ([place])   . Pneumonia   . Presence of permanent cardiac pacemaker   . Prostate cancer ([place])   . PVOD (pulmonary veno-occlusive disease) ([place])   . Squamous cell carcinoma   . Stroke [facility])    22 years ago no deficits noted.    Current Outpatient Medications  Medication Sig Dispense Refill  . amiodarone (PACERONE) 200 MG tablet TAKE ONE TABLET BY MOUTH DAILY--please make overdue follow up appt for further refills [phone] 1st attempt 90 tablet 3  . apixaban (ELIQUIS) 2.5 MG TABS tablet Take 1 tablet (2.5 mg total) by mouth 2 (two) times daily. 60 tablet 0  . aspirin EC 81 MG tablet Take 81 mg by mouth daily.    . cetirizine (ZYRTEC) 10 MG tablet Take 10 mg by mouth daily.    Marland Kitchen ELDERBERRY PO Take 300 mg by mouth daily.    . ferrous sulfate 325 (65 FE) MG tablet Take 325 mg by mouth 2 (two) times daily with a meal.     . furosemide (LASIX) 20 MG tablet Take 1 tablet (20 mg total) by mouth daily. 30 tablet 0  . glimepiride (AMARYL) 4 MG tablet Take 1 tablet (4 mg total) by mouth daily with breakfast. 30 tablet 0  . linagliptin (TRADJENTA) 5 MG TABS tablet Take 1 tablet (5 mg total) by mouth daily. 30 tablet 0  .  losartan (COZAAR) 25 MG tablet Take 1 tablet (25 mg total) by mouth daily. 30 tablet 0  . pantoprazole (PROTONIX) 40 MG tablet Take 40 mg by mouth daily.    . rosuvastatin (CRESTOR) 10 MG tablet Take 1 tablet (10 mg total) by mouth daily. 30 tablet 0   No current facility-administered medications for this visit.    ALLERGIES:   Lopressor [metoprolol tartrate] and Lipitor [atorvastatin]   SOCIAL HISTORY:  The patient  reports that he has never smoked. He has never used smokeless tobacco. He reports that he does not drink alcohol and does not use drugs.   FAMILY HISTORY:  The patient's family history includes Cancer in his sister and sister; Diabetes in his brother, brother, brother, and brother; Heart attack  in his father.   REVIEW OF SYSTEMS:  Positive for fatigue.   All other systems are reviewed and negative.   PHYSICAL EXAM: VS:  BP (!) 130/56   Pulse 71   Ht 5\' 4"  (1.626 m)   Wt 122 lb 6.4 oz (55.5 kg)   SpO2 97%   BMI 21.01 kg/m  , BMI Body mass index is 21.01 kg/m. GEN: Elderly male, in no acute distress HEENT: normal Neck: No JVD. carotids 2+ without bruits or masses Cardiac: The heart is RRR with a 3/6 late peaking harsh systolic murmur at the RUSB, absent A2, murmur heard throughout. No edema. Pedal pulses 2+ = bilaterally  Respiratory:  Diminished in the bases bilaterally GI: soft, nontender, nondistended, + BS MS: no deformity or atrophy Skin: warm and dry, no rash Neuro:  Strength and sensation are intact Psych: euthymic mood, full affect  RECENT LABS: 01/04/2020: TSH 1.060 05/12/2020: B Natriuretic Peptide 1,805.1 05/14/2020: ALT 26; BUN 32; Creatinine, Ser 2.15; Hemoglobin 8.8; Platelets 289; Potassium 3.7; Sodium 136  No results found for requested labs within last 8760 hours.   Estimated Creatinine Clearance: 21.5 mL/min (A) (by C-G formula based on SCr of 2.15 mg/dL (H)).   Wt Readings from Last 3 Encounters:  05/18/20 122 lb 6.4 oz (55.5 kg)  05/14/20 120 lb 9.5 oz (54.7 kg)  05/02/20 137 lb (62.1 kg)     CARDIAC STUDIES:  Echo:  IMPRESSIONS    1. Left ventricular ejection fraction, by estimation, is 30 to 35%. The  left ventricle has moderately decreased function. The left ventricle  demonstrates global hypokinesis. Left ventricular diastolic parameters are  consistent with Grade III diastolic  dysfunction (restrictive). Elevated left atrial pressure.  2. Right ventricular systolic function is moderately reduced. The right  ventricular size is moderately enlarged. There is severely elevated  pulmonary artery systolic pressure. The estimated right ventricular  systolic pressure is 68.3 mmHg.  3. Left atrial size was severely dilated.  4. Right  atrial size was mildly dilated.  5. The mitral valve is abnormal. Severe mitral valve regurgitation.  Moderate mitral annular calcification.  6. The tricuspid valve is abnormal. Tricuspid valve regurgitation is  severe.  7. The aortic valve is calcified. There is severe calcifcation of the  aortic valve. Severe aortic valve stenosis.Vmax 3.9 m/s, MG 35 mmHg, AVA  0.8 cm^2, DI 0.20  8. The inferior vena cava is dilated in size with <50% respiratory  variability, suggesting right atrial pressure of 15 mmHg.   Conclusion(s)/Recommendation(s): Compared to prior, LV systolic function  has worsened and now with severe MR, TR, and AS.   LEFT VENTRICLE  PLAX 2D  LVIDd:     5.40 cm Diastology  LVIDs:  4.90 cm LV e' medial:  5.19 cm/s  LV PW:     0.80 cm LV E/e' medial: 25.6  LV IVS:    0.80 cm LV e' lateral:  7.46 cm/s  LVOT diam:   2.30 cm LV E/e' lateral: 17.8  LV SV:     71  LV SV Index:  44  LVOT Area:   4.15 cm               3D Volume EF:             3D EF:    37 %             LV EDV:    205 ml             LV ESV:    130 ml             LV SV:    75 ml   RIGHT VENTRICLE  RV S prime:   5.84 cm/s  TAPSE (M-mode): 1.1 cm   LEFT ATRIUM       Index    RIGHT ATRIUM      Index  LA diam:    5.30 cm 3.28 cm/m RA Area:   19.80 cm  LA Vol (A2C):  114.0 ml 70.58 ml/m RA Volume:  56.50 ml 34.98 ml/m  LA Vol (A4C):  60.3 ml 37.33 ml/m  LA Biplane Vol: 89.6 ml 55.47 ml/m  AORTIC VALVE  AV Area (Vmax):  0.94 cm  AV Area (Vmean):  0.94 cm  AV Area (VTI):   0.91 cm  AV Vmax:      346.00 cm/s  AV Vmean:     245.000 cm/s  AV VTI:      0.782 m  AV Peak Grad:   47.9 mmHg  AV Mean Grad:   28.0 mmHg  LVOT Vmax:     78.30 cm/s  LVOT Vmean:    55.600 cm/s  LVOT VTI:     0.171 m  LVOT/AV VTI  ratio: 0.22    AORTA  Ao Root diam: 3.60 cm   MITRAL VALVE         TRICUSPID VALVE  MV Area (PHT): 6.54 cm   TR Peak grad:  59.6 mmHg  MV Decel Time: 116 msec   TR Vmax:    386.00 cm/s  MR Peak grad:  132.9 mmHg  MR Mean grad:  84.0 mmHg  SHUNTS  MR Vmax:     576.50 cm/s Systemic VTI: 0.17 m  MR Vmean:    432.0 cm/s Systemic Diam: 2.30 cm  MR PISA:     1.01 cm  MR PISA Eff ROA: 7 mm  MR PISA Radius: 0.40 cm  MV E velocity: 133.00 cm/s  MV A velocity: 28.40 cm/s  MV E/A ratio: 4.68   STS RISK CALCULATOR: Isolated AVR: Risk of Mortality: 14.045% Renal Failure: 23.518% Permanent Stroke: 2.182% Prolonged Ventilation: 31.127% DSW Infection: 0.227% Reoperation: 7.615% Morbidity or Mortality: 50.138% Short Length of Stay: 7.206% Long Length of Stay: 40.483%   ASSESSMENT AND PLAN: 1.  79 yo male with multiple medical problems outlined above who has developed acute on chronic combined systolic heart failure in the setting of severe, stage D2 aortic stenosis, severe mitral regurgitation, and severe tricuspid regurgitation (NYHA 3b).   I have reviewed the natural history of aortic stenosis with the patient and their family members who are present today. We have discussed the limitations of medical therapy and the poor prognosis  associated with symptomatic aortic stenosis. We have reviewed potential treatment options, including palliative medical therapy, conventional surgical aortic valve replacement, and transcatheter aortic valve replacement. We discussed treatment options in the context of the patient's specific comorbid medical conditions.    I have personally reviewed the patient's echo images.  This demonstrates severe calcification of the aortic valve leaflets with a fixed right coronary cusp and restricted mobility of the left and noncoronary cusps.  The mean transvalvular gradient is 36 mmHg and the dimensionless index is 0.22 in  the context of an reduced LVEF of 35%.  The patient has severe central mitral and tricuspid regurgitation.  I think he has severe, stage D2 low-flow low gradient aortic stenosis with reduced LVEF as well as severe mitral regurgitation.  I suspect his mitral regurgitation is functional but it is not clear from the surface echocardiogram.  A transesophageal echo study would better define the functional anatomy of the patient's mitral and tricuspid valves and give additional information about how he might respond to TAVR.  I reviewed the risks, indications, and alternatives of transesophageal echo with the patient and his daughter who was present today.  They agree to proceed.  We will also perform right and left heart catheterization to assess for graft patency and intracardiac hemodynamics.  The patient will be brought in early for fluid hydration and all attempts will be made to minimize contrast.  They understand the increased risk of acute kidney injury considering his recent creatinine levels have been in the range of 2.0 mg/dL.  I have reviewed the risks, indications, and alternatives to cardiac catheterization, possible angioplasty, and stenting with the patient. Risks include but are not limited to bleeding, infection, vascular injury, stroke, myocardial infection, arrhythmia, kidney injury, radiation-related injury in the case of prolonged fluoroscopy use, emergency cardiac surgery, and death. The patient understands the risks of serious complication is 1-2 in 4034 with diagnostic cardiac cath and 1-2% or less with angioplasty/stenting. Again, they understand the risk of renal deterioration is considerably higher than that quoted above because of baseline Stage 3b chronic kidney disease.  Finally, after transesophageal echo and cardiac catheterization studies are completed, the patient will need to undergo CT angiography of the chest, abdomen, and pelvis as well as a gated CT scan of the heart.  This study  will be done with renal protocol to limit contrast and reduce the risk of contrast nephropathy.  We discussed the overall risks and benefits of moving forward with evaluation of treatment options related to his valvular heart disease today. We also had specific discussion about a palliative approach to his care in the context of his age and comorbid conditions. As the patient remains functionally independent (still working and living independently with his wife), he clearly favors moving forward with treatment. While his risk of AKI and other complications is increased in the setting of comorbidities outlined above, he clearly has a very poor prognosis without aortic valve intervention.  Deatra James 05/20/2020 10:21 AM     [facility] [address] [place] [address]  [phone] (office) [phone] (fax)

## 2020-05-23 ENCOUNTER — Encounter: Payer: Self-pay | Admitting: Thoracic Surgery (Cardiothoracic Vascular Surgery)

## 2020-05-23 ENCOUNTER — Telehealth: Payer: Self-pay

## 2020-05-23 NOTE — Telephone Encounter (Signed)
Arranged TEE with Dr. Sallyanne Kuster and same day [facility] with Dr. Burt Knack this Friday (12/3).  Dx: AS and MR The patient will need to arrive at 0830 to allow for hydration and labs drawn. Covid test will be early 12/2 AM due to work schedule and transportation limitations.  Instructions given at visit last week on AVS. He and his daughter (DPR), [name], agree with plan.

## 2020-05-25 ENCOUNTER — Telehealth: Payer: Self-pay | Admitting: *Deleted

## 2020-05-25 NOTE — Telephone Encounter (Addendum)
Pt contacted pre-catheterization scheduled at [facility] for: Friday May 27, 2020 1:30 PM Verified arrival time and place: [place] [facility]) at: 8:30 AM-pre-procedure hydration/TEE 10:30 AM   No solid food after midnight prior to cath, clear liquids until 5 AM day of procedure.  Hold: Lasix-day before and day of procedure-GFR 30 Losartan-day before and day of procedure-GFR 30 Eliquis-none 05/25/20 until post procedure Glimepiride-AM of procedure Tradjenta-AM of procedure  Except hold medications AM meds can be  taken pre-cath with sips of water including: ASA 81 mg   Confirmed patient has responsible adult to drive home post procedure and be with patient first 24 hours after arriving home:yes  You are allowed ONE visitor in the waiting room during the time you are at the hospital for your procedure. Both you and your visitor must wear a mask once you enter the hospital.                Reviewed procedure/mask/visitor instructions with patient's daughter (DPR), [name].  Per Dr Apolonio Schneiders flow rate for pre-procedure hydration-3 ml/kg/hr for 1 hour, then 1 ml/kg/hr continuous.                -BMP/CBC AM of procedure on arrival to Short Stay.

## 2020-05-26 ENCOUNTER — Other Ambulatory Visit (HOSPITAL_COMMUNITY)
Admission: RE | Admit: 2020-05-26 | Discharge: 2020-05-26 | Disposition: A | Discharge status: Home / Self Care | Payer: Medicare HMO | Source: Ambulatory Visit | Attending: Cardiovascular Disease | Admitting: Cardiovascular Disease

## 2020-05-26 DIAGNOSIS — Z01812 Encounter for preprocedural laboratory examination: Secondary | ICD-10-CM | POA: Diagnosis not present

## 2020-05-26 DIAGNOSIS — Z20822 Contact with and (suspected) exposure to covid-19: Secondary | ICD-10-CM | POA: Insufficient documentation

## 2020-05-26 LAB — SARS CORONAVIRUS 2 (TAT 6-24 HRS): SARS Coronavirus 2: NEGATIVE

## 2020-05-27 ENCOUNTER — Other Ambulatory Visit: Payer: Self-pay

## 2020-05-27 ENCOUNTER — Other Ambulatory Visit: Payer: Medicare HMO

## 2020-05-27 ENCOUNTER — Ambulatory Visit (HOSPITAL_COMMUNITY)
Admission: RE | Disposition: A | Discharge status: Home / Self Care | Payer: Medicare HMO | Source: Home / Self Care | Attending: Cardiovascular Disease

## 2020-05-27 ENCOUNTER — Ambulatory Visit (HOSPITAL_COMMUNITY)
Admission: RE | Admit: 2020-05-27 | Discharge: 2020-05-27 | Disposition: A | Discharge status: Home / Self Care | Payer: Medicare HMO | Attending: Cardiovascular Disease | Admitting: Cardiovascular Disease

## 2020-05-27 ENCOUNTER — Encounter (HOSPITAL_COMMUNITY)
Admission: RE | Disposition: A | Discharge status: Home / Self Care | Payer: Self-pay | Source: Home / Self Care | Attending: Cardiovascular Disease

## 2020-05-27 ENCOUNTER — Ambulatory Visit (HOSPITAL_BASED_OUTPATIENT_CLINIC_OR_DEPARTMENT_OTHER): Payer: Medicare HMO

## 2020-05-27 DIAGNOSIS — E1122 Type 2 diabetes mellitus with diabetic chronic kidney disease: Secondary | ICD-10-CM | POA: Insufficient documentation

## 2020-05-27 DIAGNOSIS — I5043 Acute on chronic combined systolic (congestive) and diastolic (congestive) heart failure: Secondary | ICD-10-CM | POA: Diagnosis not present

## 2020-05-27 DIAGNOSIS — I340 Nonrheumatic mitral (valve) insufficiency: Secondary | ICD-10-CM

## 2020-05-27 DIAGNOSIS — Z7982 Long term (current) use of aspirin: Secondary | ICD-10-CM | POA: Insufficient documentation

## 2020-05-27 DIAGNOSIS — I350 Nonrheumatic aortic (valve) stenosis: Secondary | ICD-10-CM

## 2020-05-27 DIAGNOSIS — N1832 Chronic kidney disease, stage 3b: Secondary | ICD-10-CM | POA: Diagnosis not present

## 2020-05-27 DIAGNOSIS — Z79899 Other long term (current) drug therapy: Secondary | ICD-10-CM | POA: Diagnosis not present

## 2020-05-27 DIAGNOSIS — I130 Hypertensive heart and chronic kidney disease with heart failure and stage 1 through stage 4 chronic kidney disease, or unspecified chronic kidney disease: Secondary | ICD-10-CM | POA: Insufficient documentation

## 2020-05-27 DIAGNOSIS — Z951 Presence of aortocoronary bypass graft: Secondary | ICD-10-CM | POA: Insufficient documentation

## 2020-05-27 DIAGNOSIS — I2510 Atherosclerotic heart disease of native coronary artery without angina pectoris: Secondary | ICD-10-CM | POA: Insufficient documentation

## 2020-05-27 DIAGNOSIS — Z7901 Long term (current) use of anticoagulants: Secondary | ICD-10-CM | POA: Insufficient documentation

## 2020-05-27 DIAGNOSIS — I083 Combined rheumatic disorders of mitral, aortic and tricuspid valves: Secondary | ICD-10-CM | POA: Diagnosis not present

## 2020-05-27 DIAGNOSIS — Z7984 Long term (current) use of oral hypoglycemic drugs: Secondary | ICD-10-CM | POA: Insufficient documentation

## 2020-05-27 HISTORY — PX: TEE WITHOUT CARDIOVERSION: SHX5443

## 2020-05-27 HISTORY — PX: RIGHT/LEFT HEART CATH AND CORONARY/GRAFT ANGIOGRAPHY: CATH118267

## 2020-05-27 LAB — ECHO TEE
AR max vel: 0.99 cm2
AV Area VTI: 0.89 cm2
AV Area mean vel: 0.89 cm2
AV Mean grad: 24.6 mmHg
AV Peak grad: 49.9 mmHg
Ao pk vel: 3.53 m/s
MV M vel: 6.12 m/s
MV Peak grad: 149.8 mmHg
MV Vena cont: 0.41 cm
Radius: 0.65 cm

## 2020-05-27 LAB — CBC
HCT: 30.7 % — ABNORMAL LOW (ref 39.0–52.0)
Hemoglobin: 9.6 g/dL — ABNORMAL LOW (ref 13.0–17.0)
MCH: 31.8 pg (ref 26.0–34.0)
MCHC: 31.3 g/dL (ref 30.0–36.0)
MCV: 101.7 fL — ABNORMAL HIGH (ref 80.0–100.0)
Platelets: 234 10*3/uL (ref 150–400)
RBC: 3.02 MIL/uL — ABNORMAL LOW (ref 4.22–5.81)
RDW: 14.9 % (ref 11.5–15.5)
WBC: 5.9 10*3/uL (ref 4.0–10.5)
nRBC: 0 % (ref 0.0–0.2)

## 2020-05-27 LAB — GLUCOSE, CAPILLARY
Glucose-Capillary: 72 mg/dL (ref 70–99)
Glucose-Capillary: 63 mg/dL — ABNORMAL LOW (ref 70–99)
Glucose-Capillary: 92 mg/dL (ref 70–99)

## 2020-05-27 LAB — POCT I-STAT EG7
Acid-Base Excess: 0 mmol/L (ref 0.0–2.0)
Bicarbonate: 25 mmol/L (ref 20.0–28.0)
Calcium, Ion: 1.21 mmol/L (ref 1.15–1.40)
HCT: 28 % — ABNORMAL LOW (ref 39.0–52.0)
Hemoglobin: 9.5 g/dL — ABNORMAL LOW (ref 13.0–17.0)
O2 Saturation: 68 %
Potassium: 4 mmol/L (ref 3.5–5.1)
Sodium: 140 mmol/L (ref 135–145)
TCO2: 26 mmol/L (ref 22–32)
pCO2, Ven: 42.2 mmHg — ABNORMAL LOW (ref 44.0–60.0)
pH, Ven: 7.382 (ref 7.250–7.430)
pO2, Ven: 36 mmHg (ref 32.0–45.0)

## 2020-05-27 LAB — BASIC METABOLIC PANEL
Anion gap: 10 (ref 5–15)
BUN: 29 mg/dL — ABNORMAL HIGH (ref 8–23)
CO2: 24 mmol/L (ref 22–32)
Calcium: 8.7 mg/dL — ABNORMAL LOW (ref 8.9–10.3)
Chloride: 104 mmol/L (ref 98–111)
Creatinine, Ser: 1.7 mg/dL — ABNORMAL HIGH (ref 0.61–1.24)
GFR, Estimated: 40 mL/min — ABNORMAL LOW (ref >60)
Glucose, Bld: 80 mg/dL (ref 70–99)
Potassium: 4.4 mmol/L (ref 3.5–5.1)
Sodium: 138 mmol/L (ref 135–145)

## 2020-05-27 LAB — POCT ACTIVATED CLOTTING TIME: Activated Clotting Time: 136 seconds

## 2020-05-27 SURGERY — ECHOCARDIOGRAM, TRANSESOPHAGEAL
Anesthesia: Moderate Sedation

## 2020-05-27 SURGERY — RIGHT/LEFT HEART CATH AND CORONARY/GRAFT ANGIOGRAPHY
Anesthesia: LOCAL

## 2020-05-27 MED ORDER — MIDAZOLAM HCL 2 MG/2ML IJ SOLN
INTRAMUSCULAR | Status: DC | PRN
  Administered 2020-05-27: 1 mg via INTRAVENOUS

## 2020-05-27 MED ORDER — VERAPAMIL HCL 2.5 MG/ML IV SOLN
INTRAVENOUS | Status: AC
  Filled 2020-05-27: qty 2

## 2020-05-27 MED ORDER — HEPARIN SODIUM (PORCINE) 1000 UNIT/ML IJ SOLN
INTRAMUSCULAR | Status: AC
  Filled 2020-05-27: qty 1

## 2020-05-27 MED ORDER — SODIUM CHLORIDE 0.9% FLUSH: 3.0000 mL | INTRAVENOUS | Status: DC | PRN

## 2020-05-27 MED ORDER — LIDOCAINE HCL (PF) 1 % IJ SOLN
INTRAMUSCULAR | Status: DC | PRN
  Administered 2020-05-27: 2 mL
  Administered 2020-05-27: 1 mL
  Administered 2020-05-27: 8 mL

## 2020-05-27 MED ORDER — FENTANYL CITRATE (PF) 100 MCG/2ML IJ SOLN: INTRAMUSCULAR | Status: DC | PRN

## 2020-05-27 MED ORDER — SODIUM CHLORIDE 0.9 % WEIGHT BASED INFUSION: 1.0000 mL/kg/h | INTRAVENOUS | Status: DC

## 2020-05-27 MED ORDER — ACETAMINOPHEN 325 MG PO TABS: 650.0000 mg | ORAL_TABLET | ORAL | Status: DC | PRN

## 2020-05-27 MED ORDER — HEPARIN (PORCINE) IN NACL 1000-0.9 UT/500ML-% IV SOLN
INTRAVENOUS | Status: DC | PRN
  Administered 2020-05-27 (×2): 500 mL

## 2020-05-27 MED ORDER — ONDANSETRON HCL 4 MG/2ML IJ SOLN: 4.0000 mg | Freq: Four times a day (QID) | INTRAMUSCULAR | Status: DC | PRN

## 2020-05-27 MED ORDER — SODIUM CHLORIDE 0.9 % IV SOLN: 250.0000 mL | INTRAVENOUS | Status: DC | PRN

## 2020-05-27 MED ORDER — SODIUM CHLORIDE 0.9% FLUSH: 3.0000 mL | Freq: Two times a day (BID) | INTRAVENOUS | Status: DC

## 2020-05-27 MED ORDER — FENTANYL CITRATE (PF) 100 MCG/2ML IJ SOLN
INTRAMUSCULAR | Status: AC
  Filled 2020-05-27: qty 2

## 2020-05-27 MED ORDER — IOHEXOL 350 MG/ML SOLN
INTRAVENOUS | Status: DC | PRN
  Administered 2020-05-27: 65 mL

## 2020-05-27 MED ORDER — DIPHENHYDRAMINE HCL 50 MG/ML IJ SOLN
INTRAMUSCULAR | Status: AC
  Filled 2020-05-27: qty 1

## 2020-05-27 MED ORDER — SODIUM CHLORIDE 0.9 % IV SOLN: INTRAVENOUS | Status: DC

## 2020-05-27 MED ORDER — LIDOCAINE HCL (PF) 1 % IJ SOLN
INTRAMUSCULAR | Status: AC
  Filled 2020-05-27: qty 30

## 2020-05-27 MED ORDER — MIDAZOLAM HCL 2 MG/2ML IJ SOLN
INTRAMUSCULAR | Status: AC
  Filled 2020-05-27: qty 2

## 2020-05-27 MED ORDER — ADENOSINE 12 MG/4ML IV SOLN
INTRAVENOUS | Status: AC
  Filled 2020-05-27: qty 4

## 2020-05-27 MED ORDER — ASPIRIN 81 MG PO CHEW: 81.0000 mg | CHEWABLE_TABLET | ORAL | Status: DC

## 2020-05-27 MED ORDER — MIDAZOLAM HCL (PF) 10 MG/2ML IJ SOLN
INTRAMUSCULAR | Status: DC | PRN
  Administered 2020-05-27: 2 mg via INTRAVENOUS

## 2020-05-27 MED ORDER — FENTANYL CITRATE (PF) 100 MCG/2ML IJ SOLN
INTRAMUSCULAR | Status: DC | PRN
  Administered 2020-05-27: 25 ug via INTRAVENOUS

## 2020-05-27 MED ORDER — SODIUM CHLORIDE 0.9 % IV SOLN
INTRAVENOUS | Status: AC | PRN
  Administered 2020-05-27: 500 mL via INTRAMUSCULAR

## 2020-05-27 MED ORDER — MIDAZOLAM HCL (PF) 5 MG/ML IJ SOLN
INTRAMUSCULAR | Status: AC
  Filled 2020-05-27: qty 2

## 2020-05-27 MED ORDER — HEPARIN (PORCINE) IN NACL 1000-0.9 UT/500ML-% IV SOLN
INTRAVENOUS | Status: AC
  Filled 2020-05-27: qty 1000

## 2020-05-27 MED ORDER — SODIUM CHLORIDE 0.9 % WEIGHT BASED INFUSION
3.0000 mL/kg/h | INTRAVENOUS | Status: AC
  Administered 2020-05-27: 3 mL/kg/h via INTRAVENOUS

## 2020-05-27 MED ORDER — SODIUM CHLORIDE 0.9 % WEIGHT BASED INFUSION
1.0000 mL/kg/h | INTRAVENOUS | Status: DC
  Administered 2020-05-27: 1 mL/kg/h via INTRAVENOUS

## 2020-05-27 SURGICAL SUPPLY — 14 items
CATH INFINITI 5 FR RCB (CATHETERS) ×1 IMPLANT
CATH INFINITI 5FR MULTPACK ANG (CATHETERS) ×1 IMPLANT
CATH SWAN GANZ 7F STRAIGHT (CATHETERS) ×1 IMPLANT
CLOSURE MYNX CONTROL 5F (Vascular Products) ×1 IMPLANT
GLIDESHEATH SLEND SS 6F .021 (SHEATH) ×1 IMPLANT
GLIDESHEATH SLENDER 7FR .021G (SHEATH) ×1 IMPLANT
GUIDEWIRE .025 260CM (WIRE) ×1 IMPLANT
KIT HEART LEFT (KITS) ×2 IMPLANT
PACK CARDIAC CATHETERIZATION (CUSTOM PROCEDURE TRAY) ×2 IMPLANT
SHEATH PINNACLE 5F 10CM (SHEATH) ×1 IMPLANT
SHEATH PROBE COVER 6X72 (BAG) ×1 IMPLANT
TRANSDUCER W/STOPCOCK (MISCELLANEOUS) ×2 IMPLANT
TUBING CIL FLEX 10 FLL-RA (TUBING) ×2 IMPLANT
WIRE EMERALD 3MM-J .035X150CM (WIRE) ×1 IMPLANT

## 2020-05-27 NOTE — Progress Notes (Signed)
  Echocardiogram Echocardiogram Transesophageal has been performed.  Bryan Carney 05/27/2020, 11:06 AM

## 2020-05-27 NOTE — Op Note (Signed)
INDICATIONS: aortic and mitral valve disease  PROCEDURE:   Informed consent was obtained prior to the procedure. The risks, benefits and alternatives for the procedure were discussed and the patient comprehended these risks.  Risks include, but are not limited to, cough, sore throat, vomiting, nausea, somnolence, esophageal and stomach trauma or perforation, bleeding, low blood pressure, aspiration, pneumonia, infection, trauma to the teeth and death.    After a procedural time-out, the oropharynx was anesthetized with 20% benzocaine spray.   During this procedure the patient was administered a total of Versed 2 mg and Fentanyl 25 mcg IV to achieve and maintain moderate conscious sedation.  The patient's heart rate, blood pressure, and oxygen saturationweare monitored continuously during the procedure. The period of conscious sedation was 20 minutes, of which I was present face-to-face 100% of this time.  The transesophageal probe was inserted in the esophagus and stomach without difficulty and multiple views were obtained.  The patient was kept under observation until the patient left the procedure room.  The patient left the procedure room in stable condition.   Agitated microbubble saline contrast was not administered.  COMPLICATIONS:    There were no immediate complications.  FINDINGS:  Dilated, severely depressed LV, EF 20%. Trileaflet aortic valve with severe calcific AS and trivial AI. Moderate to severe MR with central jet. Consistent with secondary mechanism (cardiomyopathy). Moderate TR. Severe PAH. Biatrial dilation. ICD lead. Mild aortic atherosclerosis.  RECOMMENDATIONS:     Proceed with cardiac cath and workup for AVR.  Time Spent Directly with the Patient:  30 minutes   Bryan Carney 05/27/2020, 10:51 AM

## 2020-05-27 NOTE — Discharge Instructions (Signed)
Femoral Site Care This sheet gives you information about how to care for yourself after your procedure. Your health care provider may also give you more specific instructions. If you have problems or questions, contact your health care provider. What can I expect after the procedure? After the procedure, it is common to have:  Bruising that usually fades within 1-2 weeks.  Tenderness at the site. Follow these instructions at home: Wound care  Follow instructions from your health care provider about how to take care of your insertion site. Make sure you: ? Wash your hands with soap and water before you change your bandage (dressing). If soap and water are not available, use hand sanitizer. ? Change your dressing as told by your health care provider. ? Leave stitches (sutures), skin glue, or adhesive strips in place. These skin closures may need to stay in place for 2 weeks or longer. If adhesive strip edges start to loosen and curl up, you may trim the loose edges. Do not remove adhesive strips completely unless your health care provider tells you to do that.  Do not take baths, swim, or use a hot tub until your health care provider approves.  You may shower 24-48 hours after the procedure or as told by your health care provider. ? Gently wash the site with plain soap and water. ? Pat the area dry with a clean towel. ? Do not rub the site. This may cause bleeding.  Do not apply powder or lotion to the site. Keep the site clean and dry.  Check your femoral site every day for signs of infection. Check for: ? Redness, swelling, or pain. ? Fluid or blood. ? Warmth. ? Pus or a bad smell. Activity  For the first 2-3 days after your procedure, or as long as directed: ? Avoid climbing stairs as much as possible. ? Do not squat.  Do not lift anything that is heavier than 10 lb (4.5 kg), or the limit that you are told, until your health care provider says that it is safe.  Rest as  directed. ? Avoid sitting for a long time without moving. Get up to take short walks every 1-2 hours.  Do not drive for 24 hours if you were given a medicine to help you relax (sedative). General instructions  Take over-the-counter and prescription medicines only as told by your health care provider.  Keep all follow-up visits as told by your health care provider. This is important. Contact a health care provider if you have:  A fever or chills.  You have redness, swelling, or pain around your insertion site. Get help right away if:  The catheter insertion area swells very fast.  You pass out.  You suddenly start to sweat or your skin gets clammy.  The catheter insertion area is bleeding, and the bleeding does not stop when you hold steady pressure on the area.  The area near or just beyond the catheter insertion site becomes pale, cool, tingly, or numb. These symptoms may represent a serious problem that is an emergency. Do not wait to see if the symptoms will go away. Get medical help right away. Call your local emergency services (911 in the [place] Do not drive yourself to the hospital. Summary  After the procedure, it is common to have bruising that usually fades within 1-2 weeks.  Check your femoral site every day for signs of infection.  Do not lift anything that is heavier than 10 lb (4.5 kg), or the   limit that you are told, until your health care provider says that it is safe. This information is not intended to replace advice given to you by your health care provider. Make sure you discuss any questions you have with your health care provider. Document Revised: 06/24/2017 Document Reviewed: 06/24/2017 Elsevier Patient Education  2020 [employer]  

## 2020-05-27 NOTE — Progress Notes (Signed)
Patient taken to cath lab from endo, report given to cath lab RN, patient's daughter informed. All belongings transported with patient.

## 2020-05-27 NOTE — Interval H&P Note (Signed)
History and Physical Interval Note:  05/27/2020 8:42 AM  Bryan Carney  has presented today for surgery, with the diagnosis of aortic stenosis/ mitral regurgitation.  The various methods of treatment have been discussed with the patient and family. After consideration of risks, benefits and other options for treatment, the patient has consented to  Procedure(s): TRANSESOPHAGEAL ECHOCARDIOGRAM (TEE) (N/A) as a surgical intervention.  The patient's history has been reviewed, patient examined, no change in status, stable for surgery.  I have reviewed the patient's chart and labs.  Questions were answered to the patient's satisfaction.     Bryan Carney

## 2020-05-29 ENCOUNTER — Encounter (HOSPITAL_COMMUNITY): Payer: Self-pay | Admitting: Cardiovascular Disease

## 2020-05-30 ENCOUNTER — Encounter (HOSPITAL_COMMUNITY): Payer: Self-pay | Admitting: Cardiovascular Disease

## 2020-05-31 ENCOUNTER — Ambulatory Visit (INDEPENDENT_AMBULATORY_CARE_PROVIDER_SITE_OTHER): Payer: Medicare HMO

## 2020-05-31 DIAGNOSIS — I495 Sick sinus syndrome: Secondary | ICD-10-CM | POA: Diagnosis not present

## 2020-05-31 LAB — CUP PACEART REMOTE DEVICE CHECK
Battery Remaining Longevity: 88 mo
Battery Voltage: 3 V
Brady Statistic AP VP Percent: 0.09 %
Brady Statistic AP VS Percent: 99.83 %
Brady Statistic AS VP Percent: 0.01 %
Brady Statistic AS VS Percent: 0.08 %
Brady Statistic RA Percent Paced: 99.96 %
Brady Statistic RV Percent Paced: 0.09 %
Date Time Interrogation Session: 20211207064450
Implantable Lead Implant Date: 20190905
Implantable Lead Implant Date: 20190905
Implantable Lead Location: 753859
Implantable Lead Location: 753860
Implantable Lead Model: 3830
Implantable Lead Model: 5076
Implantable Pulse Generator Implant Date: 20190905
Lead Channel Impedance Value: 247 Ohm
Lead Channel Impedance Value: 266 Ohm
Lead Channel Impedance Value: 342 Ohm
Lead Channel Impedance Value: 361 Ohm
Lead Channel Pacing Threshold Amplitude: 1 V
Lead Channel Pacing Threshold Amplitude: 1.25 V
Lead Channel Pacing Threshold Pulse Width: 0.4 ms
Lead Channel Pacing Threshold Pulse Width: 0.4 ms
Lead Channel Sensing Intrinsic Amplitude: 1.875 mV
Lead Channel Sensing Intrinsic Amplitude: 1.875 mV
Lead Channel Sensing Intrinsic Amplitude: 6.75 mV
Lead Channel Sensing Intrinsic Amplitude: 6.75 mV
Lead Channel Setting Pacing Amplitude: 2 V
Lead Channel Setting Pacing Amplitude: 2.5 V
Lead Channel Setting Pacing Pulse Width: 0.8 ms
Lead Channel Setting Sensing Sensitivity: 1.2 mV

## 2020-05-31 MED FILL — Verapamil HCl IV Soln 2.5 MG/ML: INTRAVENOUS | Qty: 2 | Status: AC

## 2020-05-31 MED FILL — Adenosine IV Soln 12 MG/4ML: INTRAVENOUS | Qty: 4 | Status: AC

## 2020-05-31 MED FILL — Heparin Sodium (Porcine) Inj 1000 Unit/ML: INTRAMUSCULAR | Qty: 10 | Status: AC

## 2020-06-02 ENCOUNTER — Other Ambulatory Visit: Payer: Self-pay

## 2020-06-02 DIAGNOSIS — R0602 Shortness of breath: Secondary | ICD-10-CM

## 2020-06-02 DIAGNOSIS — I350 Nonrheumatic aortic (valve) stenosis: Secondary | ICD-10-CM

## 2020-06-03 DIAGNOSIS — I38 Endocarditis, valve unspecified: Secondary | ICD-10-CM | POA: Diagnosis not present

## 2020-06-03 DIAGNOSIS — E785 Hyperlipidemia, unspecified: Secondary | ICD-10-CM | POA: Diagnosis not present

## 2020-06-03 DIAGNOSIS — D539 Nutritional anemia, unspecified: Secondary | ICD-10-CM | POA: Diagnosis not present

## 2020-06-03 DIAGNOSIS — I509 Heart failure, unspecified: Secondary | ICD-10-CM | POA: Diagnosis not present

## 2020-06-06 ENCOUNTER — Other Ambulatory Visit: Payer: Self-pay

## 2020-06-06 DIAGNOSIS — I350 Nonrheumatic aortic (valve) stenosis: Secondary | ICD-10-CM

## 2020-06-10 ENCOUNTER — Encounter (HOSPITAL_COMMUNITY)
Admission: RE | Admit: 2020-06-10 | Discharge: 2020-06-10 | Disposition: A | Discharge status: Home / Self Care | Payer: Medicare HMO | Source: Ambulatory Visit | Attending: Cardiovascular Disease | Admitting: Cardiovascular Disease

## 2020-06-10 ENCOUNTER — Ambulatory Visit (HOSPITAL_COMMUNITY)
Admission: RE | Admit: 2020-06-10 | Discharge: 2020-06-10 | Disposition: A | Discharge status: Home / Self Care | Payer: Medicare HMO | Source: Ambulatory Visit | Attending: Cardiovascular Disease | Admitting: Cardiovascular Disease

## 2020-06-10 ENCOUNTER — Other Ambulatory Visit: Payer: Self-pay

## 2020-06-10 ENCOUNTER — Ambulatory Visit (HOSPITAL_COMMUNITY): Payer: Medicare HMO

## 2020-06-10 DIAGNOSIS — R0602 Shortness of breath: Secondary | ICD-10-CM | POA: Insufficient documentation

## 2020-06-10 DIAGNOSIS — N2889 Other specified disorders of kidney and ureter: Secondary | ICD-10-CM | POA: Diagnosis not present

## 2020-06-10 DIAGNOSIS — I350 Nonrheumatic aortic (valve) stenosis: Secondary | ICD-10-CM | POA: Insufficient documentation

## 2020-06-10 DIAGNOSIS — I2510 Atherosclerotic heart disease of native coronary artery without angina pectoris: Secondary | ICD-10-CM | POA: Diagnosis not present

## 2020-06-10 DIAGNOSIS — I700 Atherosclerosis of aorta: Secondary | ICD-10-CM | POA: Diagnosis not present

## 2020-06-10 DIAGNOSIS — K5790 Diverticulosis of intestine, part unspecified, without perforation or abscess without bleeding: Secondary | ICD-10-CM | POA: Diagnosis not present

## 2020-06-10 DIAGNOSIS — I701 Atherosclerosis of renal artery: Secondary | ICD-10-CM | POA: Diagnosis not present

## 2020-06-10 MED ORDER — SODIUM CHLORIDE 0.9 % WEIGHT BASED INFUSION: 1.0000 mL/kg/h | INTRAVENOUS | Status: DC

## 2020-06-10 MED ORDER — SODIUM CHLORIDE 0.9 % WEIGHT BASED INFUSION
3.0000 mL/kg/h | INTRAVENOUS | Status: AC
  Administered 2020-06-10: 09:00:00 3 mL/kg/h via INTRAVENOUS

## 2020-06-10 MED ORDER — IOHEXOL 350 MG/ML SOLN
100.0000 mL | Freq: Once | INTRAVENOUS | Status: AC | PRN
  Administered 2020-06-10: 11:00:00 100 mL via INTRAVENOUS

## 2020-06-10 NOTE — Progress Notes (Signed)
Carotid duplex has been completed.   Preliminary results in CV Proc.   Abram Sander 06/10/2020 8:32 AM

## 2020-06-10 NOTE — Progress Notes (Signed)
Remote pacemaker transmission.   

## 2020-06-15 ENCOUNTER — Encounter: Payer: Self-pay | Admitting: Internal Medicine

## 2020-06-21 ENCOUNTER — Encounter: Payer: Self-pay | Admitting: Physician Assistant

## 2020-06-21 ENCOUNTER — Institutional Professional Consult (permissible substitution): Payer: Medicare HMO | Admitting: Thoracic Surgery (Cardiothoracic Vascular Surgery)

## 2020-06-21 ENCOUNTER — Encounter: Payer: Self-pay | Admitting: Thoracic Surgery (Cardiothoracic Vascular Surgery)

## 2020-06-21 ENCOUNTER — Other Ambulatory Visit: Payer: Self-pay

## 2020-06-21 VITALS — BP 113/78 | HR 72 | Resp 20 | Ht 64.0 in | Wt 122.0 lb

## 2020-06-21 DIAGNOSIS — Z951 Presence of aortocoronary bypass graft: Secondary | ICD-10-CM

## 2020-06-21 DIAGNOSIS — I2510 Atherosclerotic heart disease of native coronary artery without angina pectoris: Secondary | ICD-10-CM

## 2020-06-21 DIAGNOSIS — I071 Rheumatic tricuspid insufficiency: Secondary | ICD-10-CM | POA: Insufficient documentation

## 2020-06-21 DIAGNOSIS — I361 Nonrheumatic tricuspid (valve) insufficiency: Secondary | ICD-10-CM | POA: Diagnosis not present

## 2020-06-21 DIAGNOSIS — I340 Nonrheumatic mitral (valve) insufficiency: Secondary | ICD-10-CM

## 2020-06-21 DIAGNOSIS — I350 Nonrheumatic aortic (valve) stenosis: Secondary | ICD-10-CM | POA: Diagnosis not present

## 2020-06-21 NOTE — Progress Notes (Addendum)
HEART AND [place] SURGERY CONSULTATION REPORT  Primary Cardiologist is [name], MD PCP is [name], NP  Chief Complaint  Patient presents with  . Aortic Stenosis    Initial surgical consult for TAVR/SAVR     HPI:  Patient is an 79 year old male with history of coronary artery disease status post coronary artery bypass grafting in the remote past, hypertension, hyperlipidemia, cerebrovascular disease status post stroke 1996 followed by carotid endarterectomy, pulmonary embolism, paroxysmal atrial fibrillation on long-term anticoagulation using Eliquis, sick sinus syndrome status post permanent pacemaker placement, stage III chronic kidney disease, COPD, and type 2 diabetes mellitus who was recently hospitalized with acute combined systolic and diastolic congestive heart failure and is now been referred for surgical consultation to discuss treatment options for management of severe aortic stenosis with mitral regurgitation and tricuspid regurgitation.  Patient's cardiac history dates back to 1996 when he initially presented with a stroke.  He subsequently underwent left carotid endarterectomy and was left with no residual.  He developed coronary artery disease and underwent coronary artery bypass grafting x5 in 1999 by Dr. [name].  He has been followed carefully ever since by Dr. [name].  In 2019 he was found to have episodes of paroxysmal atrial fibrillation with sick sinus syndrome and sinus pauses on routine monitor and subsequently underwent permanent pacemaker placement by Dr. [name].  He has remote history of pulmonary embolism and had been on Coumadin anticoagulation for many years but was later converted to Eliquis in 2019 following a fall with a hematoma.  He has been maintaining paced rhythm with relatively low burden of atrial fibrillation on subsequent device monitoring.  Echocardiogram performed September 2020  revealed mildly reduced left ventricular systolic function with ejection fraction estimated 45 to 50%.  There was evidence of moderate aortic stenosis with moderate mitral regurgitation.  The patient states that approximately 2 months ago he began to decline fairly quickly.  He developed rapidly progressive symptoms of exertional shortness of breath and fatigue ultimately resulting in hospitalization in November with resting shortness of breath and pulmonary edema.  He ruled out for acute myocardial infarction based on serial cardiac enzymes.  Echocardiogram performed May 12, 2020 revealed moderate to severe left ventricular systolic dysfunction with ejection fraction estimated only 30 to 35%.  There was severe aortic stenosis with peak velocity across aortic valve measured 3.9 m/s corresponding to mean transvalvular gradient 35 mmHg and aortic valve area calculated only 0.8 cm with DVI reported 0.20.  There was severe mitral regurgitation with moderate mitral annular calcification and severe tricuspid regurgitation.  Symptoms improved rapidly with IV diuresis and he was discharged home within only 2 to 3 days.  He was referred to the multidisciplinary heart valve clinic and has been evaluated previously by Dr. Burt Knack.  TEE and diagnostic cardiac catheterization were performed May 27, 2020.  TEE confirmed the presence of moderately decreased left ventricular systolic function with ejection fraction estimated only 30 to 35%.  There was moderate right ventricular chamber enlargement with moderately reduced right ventricular function and severe pulmonary hypertension.  There was severe aortic stenosis with trivial aortic insufficiency.  There was moderate mitral regurgitation and moderate tricuspid regurgitation.  Catheterization revealed severe native coronary artery disease with chronic occlusion of the mid left anterior descending coronary artery, the right coronary artery, and multiple branches of the  left circumflex system.  All 5 previous coronary artery bypass graft remained widely patent without any significant disease.  Mean transvalvular gradient across the aortic valve was measured 25 mmHg consistent with low flow low gradient severe aortic stenosis.  Pulmonary artery pressures were moderately elevated.  CT angiography has been performed and the patient was referred for surgical consultation.  Patient is married and lives locally in [place] with his wife.  He is accompanied by his daughter for his office consultation visit today.  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  He has remained functionally independent and reasonably active physically for his age.  He works 6 days a week.  He has no mobility problems and reports no significant physical limitations.  At the time of his recent hospitalization with acutely decompensated heart failure he had resting shortness of breath and orthopnea.  He states that since hospital discharge his breathing has been much better but he admits that he is not pushing himself physically.  He has never had any chest pain or chest tightness either with activity or at rest.  He has not had any dizzy spells nor syncope.  He denies any history of PND, orthopnea, or lower extremity edema.  Past Medical History:  Diagnosis Date  . Anemia   . Anginal pain ([place])   . Arthritis   . CAD (coronary artery disease)   . CHF (congestive heart failure) ([place])   . Chronic kidney disease   . COPD (chronic obstructive pulmonary disease) ([place])   . Diabetes ([place])   . Dyspnea   . Dysrhythmia   . Fibromyalgia    25 years ago.  Marland Kitchen Heart murmur   . Hx pulmonary embolism   . Hyperlipidemia   . Hypertension   . Myocardial infarction ([place])   . Pneumonia   . Presence of permanent cardiac pacemaker   . Prostate cancer ([place])   . PVOD (pulmonary veno-occlusive disease) ([place])   . S/P CABG x 5 10/19/1997   LIMA to LAD, SVG to ramus, SVG to OM2-OM3, SVG to PDA   . Squamous cell carcinoma   . Stroke [facility])    22 years ago no deficits noted.    Past Surgical History:  Procedure Laterality Date  . BACK SURGERY    . CARDIAC CATHETERIZATION  10/15/1997   Recommend complete revascularization by CABG  . CARDIOVASCULAR STRESS TEST  06/12/2012   No evidence of ischemia  . CAROTID DOPPLER  08/27/2012   Rt bulb/proximal ICA demonstrated a mild-moderate amount of fibrous plaque without evidence of a significant diameter reduction or any other vascular abnormality.  . CAROTID ENDARTERECTOMY Left 02/23/1995  . CORONARY ANGIOPLASTY    . CORONARY ARTERY BYPASS GRAFT  10/19/1997   x5. LIMA-LAD, SVG-PDA, SVG-OM1, seq SVG-fist and second branches of ramus intermedius.  Marland Kitchen HERNIA REPAIR     groin area.  Randolm Idol / REPLACE / REMOVE PACEMAKER    . NO PAST SURGERIES    . PACEMAKER IMPLANT N/A 02/27/2018   Procedure: PACEMAKER IMPLANT;  Surgeon: Constance Haw, MD;  Location: [place] [facility] LAB;  Service: Cardiovascular;  Laterality: N/A;  . RIGHT/LEFT HEART CATH AND CORONARY/GRAFT ANGIOGRAPHY N/A 05/27/2020   Procedure: RIGHT/LEFT HEART CATH AND CORONARY/GRAFT ANGIOGRAPHY;  Surgeon: Sherren Mocha, MD;  Location: [place] [facility];  Service: Cardiovascular;  Laterality: N/A;  . TEE WITHOUT CARDIOVERSION N/A 05/27/2020   Procedure: TRANSESOPHAGEAL ECHOCARDIOGRAM (TEE);  Surgeon: Sanda Klein, MD;  Location: [facility];  Service: Cardiovascular;  Laterality: N/A;    Family History  Problem Relation Age of Onset  . Heart attack Father   .  Cancer Sister   . Diabetes Brother   . Cancer Sister   . Diabetes Brother   . Diabetes Brother   . Diabetes Brother     Social History   Socioeconomic History  . Marital status: Married    Spouse name: Terryl Tubb  . Number of children: 1  . Years of education: High school 12 years  . Highest education level: 12th grade  Occupational History  . Occupation: Immunologist    Comment: owns car business  Tobacco Use   . Smoking status: Never Smoker  . Smokeless tobacco: Never Used  Vaping Use  . Vaping Use: Never used  Substance and Sexual Activity  . Alcohol use: No  . Drug use: Never  . Sexual activity: Not Currently  Other Topics Concern  . Not on file  Social History Narrative  . Not on file   Social Determinants of Health   Financial Resource Strain: Not on file  Food Insecurity: Not on file  Transportation Needs: Not on file  Physical Activity: Not on file  Stress: Not on file  Social Connections: Not on file  Intimate Partner Violence: Not on file    Current Outpatient Medications  Medication Sig Dispense Refill  . amiodarone (PACERONE) 200 MG tablet TAKE ONE TABLET BY MOUTH DAILY--please make overdue follow up appt for further refills [phone] 1st attempt (Patient taking differently: Take 200 mg by mouth daily. TAKE ONE TABLET BY MOUTH DAILY--please make overdue follow up appt for further refills [phone] 1st attempt) 90 tablet 3  . apixaban (ELIQUIS) 2.5 MG TABS tablet Take 1 tablet (2.5 mg total) by mouth 2 (two) times daily. 60 tablet 0  . aspirin EC 81 MG tablet Take 81 mg by mouth daily.    . cetirizine (ZYRTEC) 10 MG tablet Take 10 mg by mouth daily.    Marland Kitchen ELDERBERRY PO Take 300 mg by mouth daily.    . ferrous sulfate 325 (65 FE) MG tablet Take 325 mg by mouth in the morning and at bedtime.     . furosemide (LASIX) 20 MG tablet Take 1 tablet (20 mg total) by mouth daily. 30 tablet 0  . glimepiride (AMARYL) 4 MG tablet Take 1 tablet (4 mg total) by mouth daily with breakfast. 30 tablet 0  . linagliptin (TRADJENTA) 5 MG TABS tablet Take 1 tablet (5 mg total) by mouth daily. 30 tablet 0  . losartan (COZAAR) 25 MG tablet Take 1 tablet (25 mg total) by mouth daily. 30 tablet 0  . rosuvastatin (CRESTOR) 10 MG tablet Take 1 tablet (10 mg total) by mouth daily. (Patient taking differently: Take 10 mg by mouth every evening.) 30 tablet 0   No current facility-administered  medications for this visit.    Allergies  Allergen Reactions  . Lopressor [Metoprolol Tartrate] Other (See Comments)    Severe BP drop  . Lipitor [Atorvastatin]     Muscle aches      Review of Systems:   General:  normal appetite, normal energy, no weight gain, no weight loss, no fever  Cardiac:  no chest pain with exertion, no chest pain at rest, +SOB with exertion, + resting SOB, no PND, no orthopnea, no palpitations, no arrhythmia, + atrial fibrillation, + LE edema, no dizzy spells, no syncope  Respiratory:  + shortness of breath, no home oxygen, no productive cough, + chronic dry cough, no bronchitis, no wheezing, no hemoptysis, no asthma, no pain with inspiration or cough, no sleep apnea, no CPAP at  night  GI:   no difficulty swallowing, no reflux, no frequent heartburn, no hiatal hernia, no abdominal pain, no constipation, no diarrhea, no hematochezia, no hematemesis, no melena  GU:   no dysuria,  no frequency, no urinary tract infection, no hematuria, no enlarged prostate, no kidney stones, + kidney disease  Vascular:  no pain suggestive of claudication, no pain in feet, no leg cramps, no varicose veins, no DVT, no non-healing foot ulcer  Neuro:   + remote h/o stroke, no TIA's, no seizures, no headaches, no temporary blindness one eye,  no slurred speech, no peripheral neuropathy, no chronic pain, no instability of gait, no memory/cognitive dysfunction  Musculoskeletal: mild arthritis, no joint swelling, no myalgias, no difficulty walking, normal mobility   Skin:   no rash, no itching, no skin infections, no pressure sores or ulcerations  Psych:   no anxiety, no depression, no nervousness, no unusual recent stress  Eyes:   no blurry vision, no floaters, no recent vision changes, + wears glasses or contacts  ENT:   + hearing loss, no loose or painful teeth, edentulous with full denture  Hematologic:  no easy bruising, no abnormal bleeding, no clotting disorder, no frequent  epistaxis  Endocrine:  + diabetes, does check CBG's at home           Physical Exam:   BP 113/78 (BP Location: Left Arm, Patient Position: Sitting)   Pulse 72   Resp 20   Ht 5\' 4"  (1.626 m)   Wt 122 lb (55.3 kg)   SpO2 96% Comment: RA with mask on  BMI 20.94 kg/m   General:  Elderly male,  well-appearing  HEENT:  Unremarkable   Neck:   no JVD, no bruits, no adenopathy   Chest:   Bibasilar inspiratory crackles/rales 1/3 way up, symmetrical breath sounds, no wheezes, no rhonchi   CV:   RRR, grade III/VI crescendo/decrescendo murmur heard best at LLSB,  no diastolic murmur  Abdomen:  soft, non-tender, no masses   Extremities:  warm, well-perfused, pulses palpable, no LE edema  Rectal/GU  Deferred  Neuro:   Grossly non-focal and symmetrical throughout  Skin:   Clean and dry, no rashes, no breakdown   Diagnostic Tests:  ECHOCARDIOGRAM REPORT       Patient Name:  TOWNES STEFFENSON Date of Exam: 05/12/2020  Medical Rec #: [id number]   Height:    66.0 in  Accession #:  [id number]  Weight:    121.0 lb  Date of Birth: [birth year]   BSA:     1.615 m  Patient Age:  88 years   BP:      130/57 mmHg  Patient Gender: M       HR:      68 bpm.  Exam Location: Inpatient   Procedure: 2D Echo, Cardiac Doppler and Color Doppler   Indications:  CHF-Acute Systolic [id number] / [id number]    History:    Patient has prior history of Echocardiogram examinations,  most         recent 03/04/2019. CAD; Risk Factors:Hypertension, Diabetes,         Dyslipidemia and Non-Smoker. Pulmonary embolism.    Sonographer:  Vickie Epley RDCS  Referring Phys: [place]    1. Left ventricular ejection fraction, by estimation, is 30 to 35%. The  left ventricle has moderately decreased function. The left ventricle  demonstrates global hypokinesis. Left ventricular diastolic parameters are  consistent with Grade III  diastolic  dysfunction (  restrictive). Elevated left atrial pressure.  2. Right ventricular systolic function is moderately reduced. The right  ventricular size is moderately enlarged. There is severely elevated  pulmonary artery systolic pressure. The estimated right ventricular  systolic pressure is [id number] mmHg.  3. Left atrial size was severely dilated.  4. Right atrial size was mildly dilated.  5. The mitral valve is abnormal. Severe mitral valve regurgitation.  Moderate mitral annular calcification.  6. The tricuspid valve is abnormal. Tricuspid valve regurgitation is  severe.  7. The aortic valve is calcified. There is severe calcifcation of the  aortic valve. Severe aortic valve stenosis.Vmax 3.9 m/s, MG 35 mmHg, AVA  0.8 cm^2, DI 0.20  8. The inferior vena cava is dilated in size with <50% respiratory  variability, suggesting right atrial pressure of 15 mmHg.   Conclusion(s)/Recommendation(s): Compared to prior, LV systolic function  has worsened and now with severe MR, TR, and AS.   FINDINGS  Left Ventricle: Left ventricular ejection fraction, by estimation, is 30  to 35%. The left ventricle has moderately decreased function. The left  ventricle demonstrates global hypokinesis. The left ventricular internal  cavity size was normal in size.  There is no left ventricular hypertrophy. Left ventricular diastolic  parameters are consistent with Grade III diastolic dysfunction  (restrictive). Elevated left atrial pressure.   Right Ventricle: The right ventricular size is moderately enlarged. Right  vetricular wall thickness was not assessed. Right ventricular systolic  function is moderately reduced. There is severely elevated pulmonary  artery systolic pressure. The tricuspid  regurgitant velocity is 3.86 m/s, and with an assumed right atrial  pressure of 15 mmHg, the estimated right ventricular systolic pressure is  [id number] mmHg.   Left Atrium: Left atrial size was  severely dilated.   Right Atrium: Right atrial size was mildly dilated.   Pericardium: There is no evidence of pericardial effusion.   Mitral Valve: The mitral valve is abnormal. Moderate mitral annular  calcification. Severe mitral valve regurgitation.   Tricuspid Valve: The tricuspid valve is abnormal. Tricuspid valve  regurgitation is severe.   Aortic Valve: The aortic valve is calcified. There is severe calcifcation  of the aortic valve. Aortic valve regurgitation is trivial. Severe aortic  stenosis is present. Aortic valve mean gradient measures 28.0 mmHg. Aortic  valve peak gradient measures  47.9 mmHg. Aortic valve area, by VTI measures 0.91 cm.   Pulmonic Valve: The pulmonic valve was not well visualized. Pulmonic valve  regurgitation is not visualized.   Aorta: The aortic root is normal in size and structure.   Venous: The inferior vena cava is dilated in size with less than 50%  respiratory variability, suggesting right atrial pressure of 15 mmHg.   IAS/Shunts: The interatrial septum was not well visualized.     LEFT VENTRICLE  PLAX 2D  LVIDd:     5.40 cm Diastology  LVIDs:     4.90 cm LV e' medial:  5.19 cm/s  LV PW:     0.80 cm LV E/e' medial: 25.6  LV IVS:    0.80 cm LV e' lateral:  7.46 cm/s  LVOT diam:   2.30 cm LV E/e' lateral: 17.8  LV SV:     71  LV SV Index:  44  LVOT Area:   4.15 cm               3D Volume EF:             3D EF:    37 %  LV EDV:    205 ml             LV ESV:    130 ml             LV SV:    75 ml   RIGHT VENTRICLE  RV S prime:   5.84 cm/s  TAPSE (M-mode): 1.1 cm   LEFT ATRIUM       Index    RIGHT ATRIUM      Index  LA diam:    5.30 cm 3.28 cm/m RA Area:   19.80 cm  LA Vol (A2C):  114.0 ml 70.58 ml/m RA Volume:  56.50 ml 34.98 ml/m  LA Vol (A4C):  60.3 ml 37.33 ml/m   LA Biplane Vol: 89.6 ml 55.47 ml/m  AORTIC VALVE  AV Area (Vmax):  0.94 cm  AV Area (Vmean):  0.94 cm  AV Area (VTI):   0.91 cm  AV Vmax:      346.00 cm/s  AV Vmean:     245.000 cm/s  AV VTI:      0.782 m  AV Peak Grad:   47.9 mmHg  AV Mean Grad:   28.0 mmHg  LVOT Vmax:     78.30 cm/s  LVOT Vmean:    55.600 cm/s  LVOT VTI:     0.171 m  LVOT/AV VTI ratio: 0.22    AORTA  Ao Root diam: 3.60 cm   MITRAL VALVE         TRICUSPID VALVE  MV Area (PHT): 6.54 cm   TR Peak grad:  59.6 mmHg  MV Decel Time: 116 msec   TR Vmax:    386.00 cm/s  MR Peak grad:  132.9 mmHg  MR Mean grad:  84.0 mmHg  SHUNTS  MR Vmax:     576.50 cm/s Systemic VTI: 0.17 m  MR Vmean:    432.0 cm/s Systemic Diam: 2.30 cm  MR PISA:     1.01 cm  MR PISA Eff ROA: 7 mm  MR PISA Radius: 0.40 cm  MV E velocity: 133.00 cm/s  MV A velocity: 28.40 cm/s  MV E/A ratio: 4.68   Oswaldo Milian MD  Electronically signed by Oswaldo Milian MD  Signature Date/Time: 05/12/2020/10:43:00 PM       TRANSESOPHOGEAL ECHO REPORT       Patient Name:  [name] Date of Exam: 05/27/2020  Medical Rec #: [id number]   Height:    64.0 in  Accession #:  [id number]  Weight:    113.0 lb  Date of Birth: [birth year]   BSA:     1.535 m  Patient Age:  31 years   BP:      130/56 mmHg  Patient Gender: M       HR:      67 bpm.  Exam Location: Inpatient   Procedure: Transesophageal Echo, Cardiac Doppler, Color Doppler and 3D  Echo   Indications:   Aortic stenosis    History:     Patient has prior history of Echocardiogram examinations,  most          recent 05/12/2020. CHF, Previous Myocardial Infarction,  Stroke          and COPD; Risk Factors:Hypertension, Diabetes,  Dyslipidemia and          Non-Smoker.    Sonographer:   Vickie Epley RDCS   Referring Phys: [id number] [facility] CROITORU  Diagnosing Phys: Sanda Klein MD   PROCEDURE: The transesophogeal probe was passed without difficulty  through  the esophogus of the patient. Local oropharyngeal anesthetic was provided  with Cetacaine. Sedation performed by performing physician. Patients was  under conscious sedation during  this procedure. Anesthetic administered: of Fentanyl, 2.0mg  of  Versed. The patient developed no complications during the procedure.   IMPRESSIONS    1. Left ventricular ejection fraction, by estimation, is 30 to 35%. The  left ventricle has moderately decreased function. The left ventricle  demonstrates global hypokinesis. The left ventricular internal cavity size  was moderately dilated. There is mild  concentric left ventricular hypertrophy.  2. Right ventricular systolic function is moderately reduced. The right  ventricular size is moderately enlarged. There is severely elevated  pulmonary artery systolic pressure.  3. Left atrial size was severely dilated. No left atrial/left atrial  appendage thrombus was detected. The LAA emptying velocity was 20 cm/s.  4. Right atrial size was mild to moderately dilated.  5. There is mitral annulus dilation and leaflet malcoaptation, with  secondary mitral insufficiency. There is systolic reversal of flow only in  the right upper pulmonary vein, with systolic forward flow/diastolic  dominant flow in the left upper pulmonary  vein. Vena contracta 4 mm. Effective regurgitant orifice area 0.15 cm,  regurgitant volume 23 ml, regurgitant fraction 25%.. The mitral valve is  normal in structure. Moderate mitral valve regurgitation.  6. Tricuspid valve regurgitation is moderate.  7. Estimated systolic PA pressure is severely elevated at 70 mm Hg, based  on estimated right atrial pressure of 10 mm Hg.  8. The aortic valve is tricuspid. There is severe calcifcation of the  aortic valve. There is severe  thickening of the aortic valve. Aortic valve  regurgitation is trivial. Severe aortic valve stenosis. Aortic valve mean  gradient measures 24.6 mmHg.  Aortic valve Vmax measures 3.53 m/s.  9. There is mild (Grade II) atheroma plaque involving the ascending,  transverse and descending aorta.   FINDINGS  Left Ventricle: Left ventricular ejection fraction, by estimation, is 30  to 35%. The left ventricle has moderately decreased function. The left  ventricle demonstrates global hypokinesis. The left ventricular internal  cavity size was moderately dilated.  There is mild concentric left ventricular hypertrophy.   Right Ventricle: The right ventricular size is moderately enlarged. No  increase in right ventricular wall thickness. Right ventricular systolic  function is moderately reduced. There is severely elevated pulmonary  artery systolic pressure.   Left Atrium: Left atrial size was severely dilated. No left atrial/left  atrial appendage thrombus was detected. The LAA emptying velocity was 20  cm/s.   Right Atrium: Right atrial size was mild to moderately dilated.   Pericardium: There is no evidence of pericardial effusion.   Mitral Valve: There is mitral annulus dilation and leaflet malcoaptation,  with secondary mitral insufficiency. There is systolic reversal of flow  only in the right upper pulmonary vein, with systolic forward  flow/diastolic dominant flow in the left upper  pulmonary vein. Vena contracta 4 mm. Effective regurgitant orifice area  0.15 cm, regurgitant volume 23 ml, regurgitant fraction 25%. The mitral  valve is normal in structure. Moderate mitral valve regurgitation, with  centrally-directed jet.   Tricuspid Valve: The tricuspid valve is normal in structure. Tricuspid  valve regurgitation is moderate. Estimated systolic PA pressure is 70 mm  Hg, based on estimated right atrial pressure of 10 mm Hg.   Aortic Valve: Severely restricted aortic cusp  motion. Valve area by  planimetry is 1.0 cm. The aortic valve is tricuspid.  There is severe  calcifcation of the aortic valve. There is severe thickening of the aortic  valve. Aortic valve regurgitation is  trivial. Severe aortic stenosis is present. Aortic valve mean gradient  measures 24.6 mmHg. Aortic valve peak gradient measures 49.9 mmHg. Aortic  valve area, by VTI measures 0.89 cm.   Pulmonic Valve: The pulmonic valve was grossly normal. Pulmonic valve  regurgitation is mild.   Aorta: The aortic root, ascending aorta, aortic arch and descending aorta  are all structurally normal, with no evidence of dilitation or  obstruction. There is mild (Grade II) atheroma plaque involving the  ascending, transverse and descending aorta.   IAS/Shunts: No atrial level shunt detected by color flow Doppler. There is  no evidence of an atrial septal defect.   Additional Comments: A pacer wire is visualized.     LEFT VENTRICLE  PLAX 2D  LVOT diam:   2.41 cm  LV SV:     68  LV SV Index:  44  LVOT Area:   4.55 cm     AORTIC VALVE  AV Area (Vmax):  0.99 cm  AV Area (Vmean):  0.89 cm  AV Area (VTI):   0.89 cm  AV Vmax:      353.19 cm/s  AV Vmean:     228.012 cm/s  AV VTI:      0.765 m  AV Peak Grad:   49.9 mmHg  AV Mean Grad:   24.6 mmHg  LVOT Vmax:     76.86 cm/s  LVOT Vmean:    44.385 cm/s  LVOT VTI:     0.150 m  LVOT/AV VTI ratio: 0.20    AORTA  Ao Root diam: 3.67 cm   MR Peak grad:   149.8 mmHg TRICUSPID VALVE  MR Vmax:      612.05 cm/s TR Vmax:    368.15 cm/s  MR Vena Contracta: 0.41 cm  MR PISA:      2.69 cm  SHUNTS  MR PISA Radius:  0.65 cm   Systemic VTI: 0.15 m                 Systemic Diam: 2.41 cm   Dani Gobble Croitoru MD  Electronically signed by Sanda Klein MD  Signature Date/Time: 05/27/2020/11:31:27 AM       RIGHT/LEFT HEART CATH AND CORONARY/GRAFT ANGIOGRAPHY     Conclusion  1.  Severe native vessel coronary artery disease as detailed with multivessel disease involving an occluded right coronary artery, and occluded mid LAD, and multiple occluded obtuse marginal branches of the circumflex 2.  Status post aortocoronary bypass surgery with continued patency of the LIMA to LAD graft, saphenous vein graft to OM1, sequential saphenous vein graft to OM 2 and OM 3, and saphenous vein graft to PDA 3.  Moderately severe calcific aortic stenosis, possibly low-flow low gradient aortic stenosis with mean transvalvular gradient 25 mmHg  Continue multidisciplinary evaluation for consideration of TAVR.  Patient also underwent transesophageal echo today which suggested moderate mitral regurgitation and severe aortic stenosis.  He should undergo CTA studies followed by cardiac surgical consultation as part of a multidisciplinary approach to his care.  Surgeon Notes    05/27/2020 11:00 AM Op Note signed by Sanda Klein, MD    Indications  Severe aortic stenosis [I35.0 (ICD-10-CM)]   Procedural Details  Technical Details INDICATION: Severe aortic stenosis, CHF. Preop R/L heart cath study  PROCEDURAL DETAILS: There was an indwelling IV in a right antecubital vein. Using normal sterile technique, the IV  was changed out for a 6/7 Fr slender brachial sheath over a 0.018 inch wire.  A Swan-Ganz catheter was used for the right heart catheterization. Standard protocol was followed for recording of right heart pressures and sampling of oxygen saturations. Fick cardiac output was calculated.  Attention was then turned to left radial access.  The left radial artery was heavily calcified and diffusely diseased.  I made a attempt to access the vessel under ultrasound guidance but this did not work.  I never cannulated the artery.  Attention was then turned to the right groin.  Direct ultrasound guidance is used with ultrasound images digitally captured and stored in the  patient's chart.  A 5 French sheath is inserted.  Standard Judkins catheters were used for selective coronary angiography and bypass graft angiography. LV pressure is recorded and an aortic valve pullback is performed.  The aortic valve was crossed with a J-wire.  There were no immediate procedural complications. The patient was transferred to the post catheterization recovery area for further monitoring.    Estimated blood loss <50 mL.   During this procedure medications were administered to achieve and maintain moderate conscious sedation while the patient's heart rate, blood pressure, and oxygen saturation were continuously monitored and I was present face-to-face 100% of this time.   Medications (Filter: Administrations occurring from 1316 to 1442 on 05/27/20) (important) Continuous medications are totaled by the amount administered until 05/27/20 1442.    Heparin (Porcine) in NaCl 1000-0.9 UT/500ML-% SOLN (mL) Total volume:  1,000 mL  Date/Time Rate/Dose/Volume Action   05/27/20 1329 500 mL Given   1329 500 mL Given    lidocaine (PF) (XYLOCAINE) 1 % injection (mL) Total volume:  11 mL  Date/Time Rate/Dose/Volume Action   05/27/20 1349 1 mL Given   1354 2 mL Given   1407 8 mL Given    fentaNYL (SUBLIMAZE) injection (mcg) Total dose:  0 mcg  Date/Time Rate/Dose/Volume Action   05/27/20 1351 25 mcg Canceled Entry   1351 25 mcg Canceled Entry    midazolam (VERSED) injection (mg) Total dose:  1 mg  Date/Time Rate/Dose/Volume Action   05/27/20 1351 1 mg Given    iohexol (OMNIPAQUE) 350 MG/ML injection (mL) Total volume:  65 mL  Date/Time Rate/Dose/Volume Action   05/27/20 1428 65 mL Given    Sedation Time  Sedation Time Physician-1: 44 minutes 6 seconds   Contrast  Medication Name Total Dose  iohexol (OMNIPAQUE) 350 MG/ML injection 65 mL    Radiation/Fluoro  Fluoro time: 6.1 (min) DAP: 10650 (mGycm2) Cumulative Air Kerma: 175 (mGy)   Coronary  Findings   Diagnostic Dominance: Right  Left Anterior Descending  Prox LAD lesion is 95% stenosed.  Mid LAD lesion is 100% stenosed.  Left Circumflex  First Obtuse Marginal Branch  1st Mrg lesion is 100% stenosed.  Second Obtuse Marginal Branch  2nd Mrg lesion is 100% stenosed.  Right Coronary Artery  Prox RCA lesion is 100% stenosed. The lesion is calcified.  Saphenous Graft To RPDA  SVG. SVG-PDA patent, 2 prominent valves are present in the mid-body of the graft  LIMA LIMA Graft To Mid LAD  LIMA. LIMA-LAD widely patent  Saphenous Graft To 1st Mrg  SVG. SVG-OM 1 patent  Sequential Jump Graft Graft To 2nd Mrg, 3rd Mrg  Seq SVG- OM2 and OM3. graft widely patent   Intervention   No interventions have been documented.  Left Heart  Aortic Valve There is moderate aortic valve stenosis. The aortic valve is  calcified. There is restricted aortic valve motion. Mean gradient 25 mmHg, peak instantaneous gradient 44 mmHg, peak to peak gradient 28 mmHg, calculated aortic valve area 1.2 cm   Coronary Diagrams   Diagnostic Dominance: Right    Intervention    Implants    Vascular Products   Closure Mynx Control 2f [id number] - Implanted  Inventory item: CLOSURE [facility] CONTROL 85F Model/Cat number: [id number]  Manufacturer: [place] Lot number: [id number]  Device identifier: [id number] Device identifier type: GS1  Area Of Implantation: Femoral Artery     GUDID Information  Request status Successful    Brand name: MYNX CONTROL Version/Model: [id number]  Company name: [place] safety info as of 05/27/20: MR Safe  Contains dry or latex rubber: No    GMDN P.T. name: Wound hydrogel dressing, non-antimicrobial     As of 05/27/2020  Status: Implanted        Syngo Images  Show images for CARDIAC CATHETERIZATION  Images on Long Term Storage  Show images for [place], [place] to Procedure Log  Procedure Log     Hemo Data  Flowsheet  Row Most Recent Value  Fick Cardiac Output 4.87 L/min  Fick Cardiac Output Index 3.18 (L/min)/BSA  Aortic Mean Gradient 25.22 mmHg  Aortic Peak Gradient 28 mmHg  Aortic Valve Area 1.23  Aortic Value Area Index 0.8 cm2/BSA  RA A Wave 6 mmHg  RA V Wave 7 mmHg  RA Mean 5 mmHg  RV Systolic Pressure 40 mmHg  RV Diastolic Pressure 1 mmHg  RV EDP 6 mmHg  PA Systolic Pressure 43 mmHg  PA Diastolic Pressure 11 mmHg  PA Mean 21 mmHg  PW A Wave 14 mmHg  PW V Wave 16 mmHg  PW Mean 12 mmHg  AO Systolic Pressure 123456 mmHg  AO Diastolic Pressure 45 mmHg  AO Mean 70 mmHg  LV Systolic Pressure 123XX123 mmHg  LV Diastolic Pressure 2 mmHg  LV EDP 13 mmHg  AOp Systolic Pressure [id number] mmHg  AOp Diastolic Pressure 45 mmHg  AOp Mean Pressure 73 mmHg  LVp Systolic Pressure 123XX123 mmHg  LVp Diastolic Pressure 1 mmHg  LVp EDP Pressure 10 mmHg  QP/QS 1  TPVR Index 6.6 HRUI  TSVR Index 22.01 HRUI  PVR SVR Ratio 0.14  TPVR/TSVR Ratio 0.3    Cardiac TAVR CT  TECHNIQUE: The patient was scanned on a [employer]. A 120 kV retrospective scan was triggered in the descending thoracic aorta at 111 HU's. Gantry rotation speed was 250 msecs and collimation was .6 mm. No beta blockade or nitro were given. The 3D data set was reconstructed in 5% intervals of the R-R cycle. Systolic and diastolic phases were analyzed on a dedicated work station using MPR, MIP and VRT modes. The patient received 80 cc of contrast.  FINDINGS: Image quality: Excellent. The FOV was limited and did not include the entire left ventricle. TAVR analysis not affected.  Noise artifact is: Limited.  Valve Morphology: The aortic valve is tricuspid. The leaflets are severely calcified with diffuse calcifications. There is restricted movement in systole.  Aortic Valve Calcium score: 2718  Aortic annular dimension:  Phase assessed: 20%  Annular area: 573 mm2  Annular perimeter: 86.5 mm  Max diameter: 31.5  mm  Min diameter: 24.8 mm  Annular and subannular calcification: There is a trace calcification under the [place].  Optimal coplanar projection: LAO 17 CAU 11  Coronary Artery Height above Annulus:  Left Main:  18.7 mm  Right Coronary: 19.6 mm  Sinus of Valsalva Measurements:  Non-coronary: 36 mm  Right-coronary: 37 mm  Left-coronary: 37 mm  Sinus of Valsalva Height:  Non-coronary: 28.1 mm  Right-coronary: 23.0 mm  Left-coronary: 24.8 mm  Sinotubular Junction: 32 mm  Ascending Thoracic Aorta: 36 mm  Coronary Arteries: Normal coronary origin. Right dominance. The study was performed without use of NTG and is insufficient for plaque evaluation. Please refer to recent cardiac catheterization for coronary assessment. There are severe native coronary calcifications present. A patent LIMA to LAD is seen. A patent SVG to the RCA is seen. A patent SVG to an OM branch is seen.  Cardiac Morphology:  Right Atrium: Right atrial size is dilated. Pacemaker leads are seen in the RA and RV.  Right Ventricle: The right ventricular cavity is mildly dilated.  Left Atrium: Left atrial size is dilated in size with no left atrial appendage filling defect.  Left Ventricle: The ventricular cavity size is within normal limits. There are no stigmata of prior infarction. There is no abnormal filling defect. The posterior LV was not included in the FOV.  Pulmonary arteries: Normal in size without proximal filling defect.  Pulmonary veins: Normal pulmonary venous drainage.  Pericardium: Normal thickness with no significant effusion or calcium present.  Mitral Valve: The mitral valve is normal structure with moderate to severe calcification.  Extra-cardiac findings: See attached radiology report for non-cardiac structures.  IMPRESSION: 1. The FOV on this study did not include the entire LV cavity. TAVR analysis not affected.  2. Annular measurements  appropriate for 29 mm Edwards S3 TAVR (573 mm2).  3. Trace annular calcification.  4. Sufficient coronary to annulus distance.  5. Optimal Fluoroscopic Angle for Delivery: LAO 17 CAU [place] T. Audie Box, MD   Electronically Signed   By: Eleonore Chiquito   On: 06/11/2020 16:19    CT ANGIOGRAPHY CHEST, ABDOMEN AND PELVIS  TECHNIQUE: Multidetector CT imaging through the chest, abdomen and pelvis was performed using the standard protocol during bolus administration of intravenous contrast. Multiplanar reconstructed images and MIPs were obtained and reviewed to evaluate the vascular anatomy.  CONTRAST:  189mL OMNIPAQUE IOHEXOL 350 MG/ML SOLN  COMPARISON:  Chest CT 03/19/2017. CT the abdomen and pelvis 01/15/2017.  FINDINGS: CTA CHEST FINDINGS  Cardiovascular: Heart size is mildly enlarged. There is no significant pericardial fluid, thickening or pericardial calcification. There is aortic atherosclerosis, as well as atherosclerosis of the great vessels of the mediastinum and the coronary arteries, including calcified atherosclerotic plaque in the left main, left anterior descending, left circumflex and right coronary arteries. Status post median sternotomy for CABG including LIMA to the LAD. Severe thickening and calcification of the aortic valve. Calcifications of the mitral annulus. Left-sided pacemaker device in place with lead tips terminating in the right atrium and proximal aspect of the right ventricle.  Mediastinum/Lymph Nodes: Multiple prominent borderline enlarged and mildly enlarged mediastinal lymph nodes measuring up to 1.8 cm in short axis in the low right paratracheal nodal station, nonspecific. Esophagus is unremarkable in appearance. No axillary lymphadenopathy.  Lungs/Pleura: Patchy areas of ground-glass attenuation and interlobular septal thickening, suspicious for mild interstitial pulmonary edema. No acute consolidative airspace  disease. No pleural effusions. No definite suspicious appearing pulmonary nodules or masses are noted.  Musculoskeletal/Soft Tissues: There are no aggressive appearing lytic or blastic lesions noted in the visualized portions of the skeleton. Median sternotomy wires.  CTA ABDOMEN AND PELVIS FINDINGS  Hepatobiliary: No suspicious cystic or solid  hepatic lesions. No intra or extrahepatic biliary ductal dilatation. Gallbladder is normal in appearance.  Pancreas: No pancreatic mass. No pancreatic ductal dilatation. No pancreatic or peripancreatic fluid collections or inflammatory changes.  Spleen: Unremarkable.  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 1.1 cm low-attenuation lesion in the interpolar region of the left kidney, compatible with a simple cyst. Bilateral adrenal glands are normal in appearance. No hydroureteronephrosis. Urinary bladder is normal in appearance.  Stomach/Bowel: Normal appearance of the stomach. No pathologic dilatation of small bowel or colon. Numerous colonic diverticulae are noted, without surrounding inflammatory changes to suggest an acute diverticulitis at this time. Normal appendix.  Vascular/Lymphatic: Aortic atherosclerosis, without evidence of aneurysm or dissection in the abdominal or pelvic vasculature. Vascular findings and measurements pertinent to potential TAVR procedure, as detailed below. 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 No lymphadenopathy noted in the abdomen or pelvis.  Reproductive: Prostate gland and seminal vesicles are unremarkable in appearance.  Other: No significant volume of ascites.  No pneumoperitoneum.  Musculoskeletal: There are no aggressive appearing lytic or blastic lesions noted in the visualized portions of the skeleton.  VASCULAR MEASUREMENTS PERTINENT TO  TAVR:  AORTA:  Minimal Aortic Diameter-15 x 13 mm  Severity of Aortic Calcification-moderate to severe  RIGHT PELVIS:  Right Common Iliac Artery -  Minimal Diameter-8.4 x 7.7 mm  Tortuosity-mild  Calcification-moderate to severe  Right External Iliac Artery -  Minimal Diameter-6.6 x 6.9 mm  Tortuosity-moderate  Calcification-mild  Right Common Femoral Artery -  Minimal Diameter-7.3 x 5.9 mm  Tortuosity-mild  Calcification-moderate  LEFT PELVIS:  Left Common Iliac Artery -  Minimal Diameter-7.8 x 6.5 mm  Tortuosity-mild  Calcification-severe  Left External Iliac Artery -  Minimal Diameter-7.8 x 7.6 mm  Tortuosity-moderate  Calcification-mild  Left Common Femoral Artery -  Minimal Diameter-6.0 x 6.6 mm  Tortuosity-mild  Calcification-moderate  Review of the MIP images confirms the above findings.  IMPRESSION: 1. Vascular findings and measurements pertinent to potential TAVR procedure, as detailed above. 2. Severe thickening calcification of the aortic valve, compatible with reported clinical history of severe aortic stenosis. 3. Aortic atherosclerosis, in addition to left main and 3 vessel coronary artery disease. Status post median sternotomy for CABG including LIMA to the LAD. 4. 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 Clinical correlation for signs and symptoms of renovascular hypertension is recommended. 5. Cardiomegaly. Evidence of mild interstitial pulmonary edema in the lungs. Findings are concerning for mild congestive heart failure. 6. Colonic diverticulosis without evidence of acute diverticulitis at this time. 7. Additional incidental findings, as above.   Electronically Signed   By: Vinnie Langton M.D.   On: 06/11/2020 06:41    Impression:  Patient has stage D2 severe symptomatic aortic stenosis.  He was recently  hospitalized with acute exacerbation of chronic combined systolic and diastolic congestive heart failure, [place] Heart Association functional class IIIb-IV.  Symptoms of shortness of breath and pulmonary edema rapidly improved on medical therapy and the patient remains clinically stable at this time.  At present he reports no SOB at rest nor with low level activity, but he does have bibasilar rales on physical exam.  I have personally reviewed the patient's recent transthoracic and transesophageal echocardiograms, diagnostic cardiac catheterization, and CT angiograms.  Echocardiograms demonstrate the presence of moderately severe global left ventricular systolic dysfunction with ejection fraction ranging from  30 to 35%.  There is severe aortic stenosis.  The aortic valve is trileaflet with severe thickening, calcification, and restricted leaflet mobility involving all 3 leaflets of the aortic valve.  Peak velocity across the aortic valve on transthoracic echocardiogram measured as high as 3.9 m/s corresponding to mean transvalvular gradient estimated 35 mmHg corresponding to aortic valve area calculated 0.80 cm.  The DVI was remarkably low at 0.20.  At the time of transthoracic echocardiogram the patient also had severe mitral regurgitation and tricuspid regurgitation.  Follow-up transesophageal echocardiogram revealed similar findings although the severity of both mitral regurgitation and tricuspid regurgitation appeared somewhat improved, and functional anatomy revealed findings consistent with secondary (functional) mitral regurgitation.  Diagnostic cardiac catheterization revealed severe native coronary artery disease but continued patency of all 5 previously placed coronary artery bypass grafts performed by Dr. [name] at the time of the patient's bypass surgery in 1999.  Pulmonary artery pressures were moderately elevated.  I agree the patient needs aortic valve replacement.  Given the patient's  clinical history with recent sudden acute decompensation I remain hopeful that the patient's left ventricular systolic function, mitral regurgitation, and tricuspid regurgitation may all continue to gradually improve following successful aortic valve replacement.  However, I would not consider this elderly gentleman with moderate to severe global left ventricular systolic dysfunction and numerous other comorbid medical problems a candidate for conventional surgery.  Cardiac-gated CTA of the heart reveals anatomical characteristics consistent with aortic stenosis suitable for treatment by transcatheter aortic valve replacement without any significant complicating features and CTA of the aorta and iliac vessels demonstrate what appears to be adequate pelvic vascular access to facilitate a transfemoral approach.  The patient has previously undergone implantation of permanent pacemaker for sick sinus syndrome.    Plan:  The patient and his daughter were counseled at length regarding treatment alternatives for management of severe symptomatic aortic stenosis. Alternative approaches such as conventional aortic valve replacement, transcatheter aortic valve replacement, and continued medical therapy without intervention were compared and contrasted at length.  The risks associated with conventional surgical aortic valve replacement were discussed in detail, as were expectations for post-operative convalescence, and why I would be reluctant to consider this patient a candidate for conventional surgery.  Issues specific to transcatheter aortic valve replacement were discussed including questions about long term valve durability, the potential for paravalvular leak, possible increased risk of need for permanent pacemaker placement, and other technical complications related to the procedure itself.  Long-term prognosis with medical therapy was discussed. This discussion was placed in the context of the patient's own  specific clinical presentation and past medical history.  All of their questions have been addressed.  The patient desires to proceed with transcatheter aortic valve replacement as soon as practical.  We plan for surgery on June 28, 2020.  Following the decision to proceed with transcatheter aortic valve replacement, a discussion has been held regarding what types of management strategies would be attempted intraoperatively in the event of life-threatening complications, including whether or not the patient would be considered a candidate for the use of cardiopulmonary bypass and/or conversion to open sternotomy for attempted surgical intervention.  The patient specifically requests that should a potentially life-threatening complication develop we would not attempt emergency median sternotomy and/or other aggressive surgical procedures.  The patient has been advised of a variety of complications that might develop including but not limited to risks of death, stroke, paravalvular leak, aortic dissection or other major vascular complications, aortic annulus rupture, device  embolization, cardiac rupture or perforation, mitral regurgitation, acute myocardial infarction, arrhythmia, heart block or bradycardia requiring permanent pacemaker placement, congestive heart failure, respiratory failure, renal failure, pneumonia, infection, other late complications related to structural valve deterioration or migration, or other complications that might ultimately cause a temporary or permanent loss of functional independence or other long term morbidity.  The patient provides full informed consent for the procedure as described and all questions were answered.    I spent in excess of 90 minutes during the conduct of this office consultation and >50% of this time involved direct face-to-face encounter with the patient for counseling and/or coordination of their care.      Valentina Gu. Roxy Manns, MD 06/21/2020 4:14  PM

## 2020-06-21 NOTE — H&P (View-Only) (Signed)
[facility] SURGERY CONSULTATION REPORT  Primary Cardiologist is [name], MD PCP is [name], NP  Chief Complaint  Patient presents with  . Aortic Stenosis    Initial surgical consult for TAVR/SAVR     HPI:  Patient is an 79 year old male with history of coronary artery disease status post coronary artery bypass grafting in the remote past, hypertension, hyperlipidemia, cerebrovascular disease status post stroke 1996 followed by carotid endarterectomy, pulmonary embolism, paroxysmal atrial fibrillation on long-term anticoagulation using Eliquis, sick sinus syndrome status post permanent pacemaker placement, stage III chronic kidney disease, COPD, and type 2 diabetes mellitus who was recently hospitalized with acute combined systolic and diastolic congestive heart failure and is now been referred for surgical consultation to discuss treatment options for management of severe aortic stenosis with mitral regurgitation and tricuspid regurgitation.  Patient's cardiac history dates back to 1996 when he initially presented with a stroke.  He subsequently underwent left carotid endarterectomy and was left with no residual.  He developed coronary artery disease and underwent coronary artery bypass grafting x5 in 1999 by Dr. [name].  He has been followed carefully ever since by Dr. [name].  In 2019 he was found to have episodes of paroxysmal atrial fibrillation with sick sinus syndrome and sinus pauses on routine monitor and subsequently underwent permanent pacemaker placement by Dr. [name].  He has remote history of pulmonary embolism and had been on Coumadin anticoagulation for many years but was later converted to Eliquis in 2019 following a fall with a hematoma.  He has been maintaining paced rhythm with relatively low burden of atrial fibrillation on subsequent device monitoring.  Echocardiogram performed September 2020  revealed mildly reduced left ventricular systolic function with ejection fraction estimated 45 to 50%.  There was evidence of moderate aortic stenosis with moderate mitral regurgitation.  The patient states that approximately 2 months ago he began to decline fairly quickly.  He developed rapidly progressive symptoms of exertional shortness of breath and fatigue ultimately resulting in hospitalization in November with resting shortness of breath and pulmonary edema.  He ruled out for acute myocardial infarction based on serial cardiac enzymes.  Echocardiogram performed May 12, 2020 revealed moderate to severe left ventricular systolic dysfunction with ejection fraction estimated only 30 to 35%.  There was severe aortic stenosis with peak velocity across aortic valve measured 3.9 m/s corresponding to mean transvalvular gradient 35 mmHg and aortic valve area calculated only 0.8 cm with DVI reported 0.20.  There was severe mitral regurgitation with moderate mitral annular calcification and severe tricuspid regurgitation.  Symptoms improved rapidly with IV diuresis and he was discharged home within only 2 to 3 days.  He was referred to the multidisciplinary heart valve clinic and has been evaluated previously by Dr. Burt Knack.  TEE and diagnostic cardiac catheterization were performed May 27, 2020.  TEE confirmed the presence of moderately decreased left ventricular systolic function with ejection fraction estimated only 30 to 35%.  There was moderate right ventricular chamber enlargement with moderately reduced right ventricular function and severe pulmonary hypertension.  There was severe aortic stenosis with trivial aortic insufficiency.  There was moderate mitral regurgitation and moderate tricuspid regurgitation.  Catheterization revealed severe native coronary artery disease with chronic occlusion of the mid left anterior descending coronary artery, the right coronary artery, and multiple branches of the  left circumflex system.  All 5 previous coronary artery bypass graft remained widely patent without any significant disease.  Mean transvalvular gradient across the aortic valve was measured 25 mmHg consistent with low flow low gradient severe aortic stenosis.  Pulmonary artery pressures were moderately elevated.  CT angiography has been performed and the patient was referred for surgical consultation.  Patient is married and lives locally in [place] with his wife.  He is accompanied by his daughter for his office consultation visit today.  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  He has remained functionally independent and reasonably active physically for his age.  He works 6 days a week.  He has no mobility problems and reports no significant physical limitations.  At the time of his recent hospitalization with acutely decompensated heart failure he had resting shortness of breath and orthopnea.  He states that since hospital discharge his breathing has been much better but he admits that he is not pushing himself physically.  He has never had any chest pain or chest tightness either with activity or at rest.  He has not had any dizzy spells nor syncope.  He denies any history of PND, orthopnea, or lower extremity edema.  Past Medical History:  Diagnosis Date  . Anemia   . Anginal pain ([place])   . Arthritis   . CAD (coronary artery disease)   . CHF (congestive heart failure) ([place])   . Chronic kidney disease   . COPD (chronic obstructive pulmonary disease) ([place])   . Diabetes ([place])   . Dyspnea   . Dysrhythmia   . Fibromyalgia    25 years ago.  Marland Kitchen Heart murmur   . Hx pulmonary embolism   . Hyperlipidemia   . Hypertension   . Myocardial infarction ([place])   . Pneumonia   . Presence of permanent cardiac pacemaker   . Prostate cancer ([place])   . PVOD (pulmonary veno-occlusive disease) ([place])   . S/P CABG x 5 10/19/1997   LIMA to LAD, SVG to ramus, SVG to OM2-OM3, SVG to PDA   . Squamous cell carcinoma   . Stroke [facility])    22 years ago no deficits noted.    Past Surgical History:  Procedure Laterality Date  . BACK SURGERY    . CARDIAC CATHETERIZATION  10/15/1997   Recommend complete revascularization by CABG  . CARDIOVASCULAR STRESS TEST  06/12/2012   No evidence of ischemia  . CAROTID DOPPLER  08/27/2012   Rt bulb/proximal ICA demonstrated a mild-moderate amount of fibrous plaque without evidence of a significant diameter reduction or any other vascular abnormality.  . CAROTID ENDARTERECTOMY Left 02/23/1995  . CORONARY ANGIOPLASTY    . CORONARY ARTERY BYPASS GRAFT  10/19/1997   x5. LIMA-LAD, SVG-PDA, SVG-OM1, seq SVG-fist and second branches of ramus intermedius.  Marland Kitchen HERNIA REPAIR     groin area.  Randolm Idol / REPLACE / REMOVE PACEMAKER    . NO PAST SURGERIES    . PACEMAKER IMPLANT N/A 02/27/2018   Procedure: PACEMAKER IMPLANT;  Surgeon: Constance Haw, MD;  Location: [place] [facility];  Service: Cardiovascular;  Laterality: N/A;  . RIGHT/LEFT HEART CATH AND CORONARY/GRAFT ANGIOGRAPHY N/A 05/27/2020   Procedure: RIGHT/LEFT HEART CATH AND CORONARY/GRAFT ANGIOGRAPHY;  Surgeon: Sherren Mocha, MD;  Location: [place] [facility];  Service: Cardiovascular;  Laterality: N/A;  . TEE WITHOUT CARDIOVERSION N/A 05/27/2020   Procedure: TRANSESOPHAGEAL ECHOCARDIOGRAM (TEE);  Surgeon: Sanda Klein, MD;  Location: [facility];  Service: Cardiovascular;  Laterality: N/A;    Family History  Problem Relation Age of Onset  . Heart attack Father   .  Cancer Sister   . Diabetes Brother   . Cancer Sister   . Diabetes Brother   . Diabetes Brother   . Diabetes Brother     Social History   Socioeconomic History  . Marital status: Married    Spouse name: Isair Dekker  . Number of children: 1  . Years of education: High school 12 years  . Highest education level: 12th grade  Occupational History  . Occupation: Immunologist    Comment: owns car business  Tobacco Use   . Smoking status: Never Smoker  . Smokeless tobacco: Never Used  Vaping Use  . Vaping Use: Never used  Substance and Sexual Activity  . Alcohol use: No  . Drug use: Never  . Sexual activity: Not Currently  Other Topics Concern  . Not on file  Social History Narrative  . Not on file   Social Determinants of Health   Financial Resource Strain: Not on file  Food Insecurity: Not on file  Transportation Needs: Not on file  Physical Activity: Not on file  Stress: Not on file  Social Connections: Not on file  Intimate Partner Violence: Not on file    Current Outpatient Medications  Medication Sig Dispense Refill  . amiodarone (PACERONE) 200 MG tablet TAKE ONE TABLET BY MOUTH DAILY--please make overdue follow up appt for further refills [phone] 1st attempt (Patient taking differently: Take 200 mg by mouth daily. TAKE ONE TABLET BY MOUTH DAILY--please make overdue follow up appt for further refills [phone] 1st attempt) 90 tablet 3  . apixaban (ELIQUIS) 2.5 MG TABS tablet Take 1 tablet (2.5 mg total) by mouth 2 (two) times daily. 60 tablet 0  . aspirin EC 81 MG tablet Take 81 mg by mouth daily.    . cetirizine (ZYRTEC) 10 MG tablet Take 10 mg by mouth daily.    Marland Kitchen ELDERBERRY PO Take 300 mg by mouth daily.    . ferrous sulfate 325 (65 FE) MG tablet Take 325 mg by mouth in the morning and at bedtime.     . furosemide (LASIX) 20 MG tablet Take 1 tablet (20 mg total) by mouth daily. 30 tablet 0  . glimepiride (AMARYL) 4 MG tablet Take 1 tablet (4 mg total) by mouth daily with breakfast. 30 tablet 0  . linagliptin (TRADJENTA) 5 MG TABS tablet Take 1 tablet (5 mg total) by mouth daily. 30 tablet 0  . losartan (COZAAR) 25 MG tablet Take 1 tablet (25 mg total) by mouth daily. 30 tablet 0  . rosuvastatin (CRESTOR) 10 MG tablet Take 1 tablet (10 mg total) by mouth daily. (Patient taking differently: Take 10 mg by mouth every evening.) 30 tablet 0   No current facility-administered  medications for this visit.    Allergies  Allergen Reactions  . Lopressor [Metoprolol Tartrate] Other (See Comments)    Severe BP drop  . Lipitor [Atorvastatin]     Muscle aches      Review of Systems:   General:  normal appetite, normal energy, no weight gain, no weight loss, no fever  Cardiac:  no chest pain with exertion, no chest pain at rest, +SOB with exertion, + resting SOB, no PND, no orthopnea, no palpitations, no arrhythmia, + atrial fibrillation, + LE edema, no dizzy spells, no syncope  Respiratory:  + shortness of breath, no home oxygen, no productive cough, + chronic dry cough, no bronchitis, no wheezing, no hemoptysis, no asthma, no pain with inspiration or cough, no sleep apnea, no CPAP at  night  GI:   no difficulty swallowing, no reflux, no frequent heartburn, no hiatal hernia, no abdominal pain, no constipation, no diarrhea, no hematochezia, no hematemesis, no melena  GU:   no dysuria,  no frequency, no urinary tract infection, no hematuria, no enlarged prostate, no kidney stones, + kidney disease  Vascular:  no pain suggestive of claudication, no pain in feet, no leg cramps, no varicose veins, no DVT, no non-healing foot ulcer  Neuro:   + remote h/o stroke, no TIA's, no seizures, no headaches, no temporary blindness one eye,  no slurred speech, no peripheral neuropathy, no chronic pain, no instability of gait, no memory/cognitive dysfunction  Musculoskeletal: mild arthritis, no joint swelling, no myalgias, no difficulty walking, normal mobility   Skin:   no rash, no itching, no skin infections, no pressure sores or ulcerations  Psych:   no anxiety, no depression, no nervousness, no unusual recent stress  Eyes:   no blurry vision, no floaters, no recent vision changes, + wears glasses or contacts  ENT:   + hearing loss, no loose or painful teeth, edentulous with full denture  Hematologic:  no easy bruising, no abnormal bleeding, no clotting disorder, no frequent  epistaxis  Endocrine:  + diabetes, does check CBG's at home           Physical Exam:   BP 113/78 (BP Location: Left Arm, Patient Position: Sitting)   Pulse 72   Resp 20   Ht 5\' 4"  (1.626 m)   Wt 122 lb (55.3 kg)   SpO2 96% Comment: RA with mask on  BMI 20.94 kg/m   General:  Elderly male,  well-appearing  HEENT:  Unremarkable   Neck:   no JVD, no bruits, no adenopathy   Chest:   Bibasilar inspiratory crackles/rales 1/3 way up, symmetrical breath sounds, no wheezes, no rhonchi   CV:   RRR, grade III/VI crescendo/decrescendo murmur heard best at LLSB,  no diastolic murmur  Abdomen:  soft, non-tender, no masses   Extremities:  warm, well-perfused, pulses palpable, no LE edema  Rectal/GU  Deferred  Neuro:   Grossly non-focal and symmetrical throughout  Skin:   Clean and dry, no rashes, no breakdown   Diagnostic Tests:  ECHOCARDIOGRAM REPORT       Patient Name:  Bryan Carney Date of Exam: 05/12/2020  Medical Rec #: [id number]   Height:    66.0 in  Accession #:  [id number]  Weight:    121.0 lb  Date of Birth: 10/01/1940   BSA:     1.615 m  Patient Age:  31 years   BP:      130/57 mmHg  Patient Gender: M       HR:      68 bpm.  Exam Location: Inpatient   Procedure: 2D Echo, Cardiac Doppler and Color Doppler   Indications:  CHF-Acute Systolic [id number] / [id number]    History:    Patient has prior history of Echocardiogram examinations,  most         recent 03/04/2019. CAD; Risk Factors:Hypertension, Diabetes,         Dyslipidemia and Non-Smoker. Pulmonary embolism.    Sonographer:  Vickie Epley RDCS  Referring Phys: [place]    1. Left ventricular ejection fraction, by estimation, is 30 to 35%. The  left ventricle has moderately decreased function. The left ventricle  demonstrates global hypokinesis. Left ventricular diastolic parameters are  consistent with Grade III  diastolic  dysfunction (  restrictive). Elevated left atrial pressure.  2. Right ventricular systolic function is moderately reduced. The right  ventricular size is moderately enlarged. There is severely elevated  pulmonary artery systolic pressure. The estimated right ventricular  systolic pressure is [id number] mmHg.  3. Left atrial size was severely dilated.  4. Right atrial size was mildly dilated.  5. The mitral valve is abnormal. Severe mitral valve regurgitation.  Moderate mitral annular calcification.  6. The tricuspid valve is abnormal. Tricuspid valve regurgitation is  severe.  7. The aortic valve is calcified. There is severe calcifcation of the  aortic valve. Severe aortic valve stenosis.Vmax 3.9 m/s, MG 35 mmHg, AVA  0.8 cm^2, DI 0.20  8. The inferior vena cava is dilated in size with <50% respiratory  variability, suggesting right atrial pressure of 15 mmHg.   Conclusion(s)/Recommendation(s): Compared to prior, LV systolic function  has worsened and now with severe MR, TR, and AS.   FINDINGS  Left Ventricle: Left ventricular ejection fraction, by estimation, is 30  to 35%. The left ventricle has moderately decreased function. The left  ventricle demonstrates global hypokinesis. The left ventricular internal  cavity size was normal in size.  There is no left ventricular hypertrophy. Left ventricular diastolic  parameters are consistent with Grade III diastolic dysfunction  (restrictive). Elevated left atrial pressure.   Right Ventricle: The right ventricular size is moderately enlarged. Right  vetricular wall thickness was not assessed. Right ventricular systolic  function is moderately reduced. There is severely elevated pulmonary  artery systolic pressure. The tricuspid  regurgitant velocity is 3.86 m/s, and with an assumed right atrial  pressure of 15 mmHg, the estimated right ventricular systolic pressure is  [id number] mmHg.   Left Atrium: Left atrial size was  severely dilated.   Right Atrium: Right atrial size was mildly dilated.   Pericardium: There is no evidence of pericardial effusion.   Mitral Valve: The mitral valve is abnormal. Moderate mitral annular  calcification. Severe mitral valve regurgitation.   Tricuspid Valve: The tricuspid valve is abnormal. Tricuspid valve  regurgitation is severe.   Aortic Valve: The aortic valve is calcified. There is severe calcifcation  of the aortic valve. Aortic valve regurgitation is trivial. Severe aortic  stenosis is present. Aortic valve mean gradient measures 28.0 mmHg. Aortic  valve peak gradient measures  47.9 mmHg. Aortic valve area, by VTI measures 0.91 cm.   Pulmonic Valve: The pulmonic valve was not well visualized. Pulmonic valve  regurgitation is not visualized.   Aorta: The aortic root is normal in size and structure.   Venous: The inferior vena cava is dilated in size with less than 50%  respiratory variability, suggesting right atrial pressure of 15 mmHg.   IAS/Shunts: The interatrial septum was not well visualized.     LEFT VENTRICLE  PLAX 2D  LVIDd:     5.40 cm Diastology  LVIDs:     4.90 cm LV e' medial:  5.19 cm/s  LV PW:     0.80 cm LV E/e' medial: 25.6  LV IVS:    0.80 cm LV e' lateral:  7.46 cm/s  LVOT diam:   2.30 cm LV E/e' lateral: 17.8  LV SV:     71  LV SV Index:  44  LVOT Area:   4.15 cm               3D Volume EF:             3D EF:    37 %  LV EDV:    205 ml             LV ESV:    130 ml             LV SV:    75 ml   RIGHT VENTRICLE  RV S prime:   5.84 cm/s  TAPSE (M-mode): 1.1 cm   LEFT ATRIUM       Index    RIGHT ATRIUM      Index  LA diam:    5.30 cm 3.28 cm/m RA Area:   19.80 cm  LA Vol (A2C):  114.0 ml 70.58 ml/m RA Volume:  56.50 ml 34.98 ml/m  LA Vol (A4C):  60.3 ml 37.33 ml/m   LA Biplane Vol: 89.6 ml 55.47 ml/m  AORTIC VALVE  AV Area (Vmax):  0.94 cm  AV Area (Vmean):  0.94 cm  AV Area (VTI):   0.91 cm  AV Vmax:      346.00 cm/s  AV Vmean:     245.000 cm/s  AV VTI:      0.782 m  AV Peak Grad:   47.9 mmHg  AV Mean Grad:   28.0 mmHg  LVOT Vmax:     78.30 cm/s  LVOT Vmean:    55.600 cm/s  LVOT VTI:     0.171 m  LVOT/AV VTI ratio: 0.22    AORTA  Ao Root diam: 3.60 cm   MITRAL VALVE         TRICUSPID VALVE  MV Area (PHT): 6.54 cm   TR Peak grad:  59.6 mmHg  MV Decel Time: 116 msec   TR Vmax:    386.00 cm/s  MR Peak grad:  132.9 mmHg  MR Mean grad:  84.0 mmHg  SHUNTS  MR Vmax:     576.50 cm/s Systemic VTI: 0.17 m  MR Vmean:    432.0 cm/s Systemic Diam: 2.30 cm  MR PISA:     1.01 cm  MR PISA Eff ROA: 7 mm  MR PISA Radius: 0.40 cm  MV E velocity: 133.00 cm/s  MV A velocity: 28.40 cm/s  MV E/A ratio: 4.68   Oswaldo Milian MD  Electronically signed by Oswaldo Milian MD  Signature Date/Time: 05/12/2020/10:43:00 PM       TRANSESOPHOGEAL ECHO REPORT       Patient Name:  [name] Date of Exam: 05/27/2020  Medical Rec #: [id number]   Height:    64.0 in  Accession #:  [id number]  Weight:    113.0 lb  Date of Birth: December 21, 1940   BSA:     1.535 m  Patient Age:  9 years   BP:      130/56 mmHg  Patient Gender: M       HR:      67 bpm.  Exam Location: Inpatient   Procedure: Transesophageal Echo, Cardiac Doppler, Color Doppler and 3D  Echo   Indications:   Aortic stenosis    History:     Patient has prior history of Echocardiogram examinations,  most          recent 05/12/2020. CHF, Previous Myocardial Infarction,  Stroke          and COPD; Risk Factors:Hypertension, Diabetes,  Dyslipidemia and          Non-Smoker.    Sonographer:   Vickie Epley RDCS   Referring Phys: [id number] [facility]  Diagnosing Phys: Sanda Klein MD   PROCEDURE: The transesophogeal probe was passed without difficulty  through  the esophogus of the patient. Local oropharyngeal anesthetic was provided  with Cetacaine. Sedation performed by performing physician. Patients was  under conscious sedation during  this procedure. Anesthetic administered: of Fentanyl, 2.0mg  of  Versed. The patient developed no complications during the procedure.   IMPRESSIONS    1. Left ventricular ejection fraction, by estimation, is 30 to 35%. The  left ventricle has moderately decreased function. The left ventricle  demonstrates global hypokinesis. The left ventricular internal cavity size  was moderately dilated. There is mild  concentric left ventricular hypertrophy.  2. Right ventricular systolic function is moderately reduced. The right  ventricular size is moderately enlarged. There is severely elevated  pulmonary artery systolic pressure.  3. Left atrial size was severely dilated. No left atrial/left atrial  appendage thrombus was detected. The LAA emptying velocity was 20 cm/s.  4. Right atrial size was mild to moderately dilated.  5. There is mitral annulus dilation and leaflet malcoaptation, with  secondary mitral insufficiency. There is systolic reversal of flow only in  the right upper pulmonary vein, with systolic forward flow/diastolic  dominant flow in the left upper pulmonary  vein. Vena contracta 4 mm. Effective regurgitant orifice area 0.15 cm,  regurgitant volume 23 ml, regurgitant fraction 25%.. The mitral valve is  normal in structure. Moderate mitral valve regurgitation.  6. Tricuspid valve regurgitation is moderate.  7. Estimated systolic PA pressure is severely elevated at 70 mm Hg, based  on estimated right atrial pressure of 10 mm Hg.  8. The aortic valve is tricuspid. There is severe calcifcation of the  aortic valve. There is severe  thickening of the aortic valve. Aortic valve  regurgitation is trivial. Severe aortic valve stenosis. Aortic valve mean  gradient measures 24.6 mmHg.  Aortic valve Vmax measures 3.53 m/s.  9. There is mild (Grade II) atheroma plaque involving the ascending,  transverse and descending aorta.   FINDINGS  Left Ventricle: Left ventricular ejection fraction, by estimation, is 30  to 35%. The left ventricle has moderately decreased function. The left  ventricle demonstrates global hypokinesis. The left ventricular internal  cavity size was moderately dilated.  There is mild concentric left ventricular hypertrophy.   Right Ventricle: The right ventricular size is moderately enlarged. No  increase in right ventricular wall thickness. Right ventricular systolic  function is moderately reduced. There is severely elevated pulmonary  artery systolic pressure.   Left Atrium: Left atrial size was severely dilated. No left atrial/left  atrial appendage thrombus was detected. The LAA emptying velocity was 20  cm/s.   Right Atrium: Right atrial size was mild to moderately dilated.   Pericardium: There is no evidence of pericardial effusion.   Mitral Valve: There is mitral annulus dilation and leaflet malcoaptation,  with secondary mitral insufficiency. There is systolic reversal of flow  only in the right upper pulmonary vein, with systolic forward  flow/diastolic dominant flow in the left upper  pulmonary vein. Vena contracta 4 mm. Effective regurgitant orifice area  0.15 cm, regurgitant volume 23 ml, regurgitant fraction 25%. The mitral  valve is normal in structure. Moderate mitral valve regurgitation, with  centrally-directed jet.   Tricuspid Valve: The tricuspid valve is normal in structure. Tricuspid  valve regurgitation is moderate. Estimated systolic PA pressure is 70 mm  Hg, based on estimated right atrial pressure of 10 mm Hg.   Aortic Valve: Severely restricted aortic cusp  motion. Valve area by  planimetry is 1.0 cm. The aortic valve is tricuspid.  There is severe  calcifcation of the aortic valve. There is severe thickening of the aortic  valve. Aortic valve regurgitation is  trivial. Severe aortic stenosis is present. Aortic valve mean gradient  measures 24.6 mmHg. Aortic valve peak gradient measures 49.9 mmHg. Aortic  valve area, by VTI measures 0.89 cm.   Pulmonic Valve: The pulmonic valve was grossly normal. Pulmonic valve  regurgitation is mild.   Aorta: The aortic root, ascending aorta, aortic arch and descending aorta  are all structurally normal, with no evidence of dilitation or  obstruction. There is mild (Grade II) atheroma plaque involving the  ascending, transverse and descending aorta.   IAS/Shunts: No atrial level shunt detected by color flow Doppler. There is  no evidence of an atrial septal defect.   Additional Comments: A pacer wire is visualized.     LEFT VENTRICLE  PLAX 2D  LVOT diam:   2.41 cm  LV SV:     68  LV SV Index:  44  LVOT Area:   4.55 cm     AORTIC VALVE  AV Area (Vmax):  0.99 cm  AV Area (Vmean):  0.89 cm  AV Area (VTI):   0.89 cm  AV Vmax:      353.19 cm/s  AV Vmean:     228.012 cm/s  AV VTI:      0.765 m  AV Peak Grad:   49.9 mmHg  AV Mean Grad:   24.6 mmHg  LVOT Vmax:     76.86 cm/s  LVOT Vmean:    44.385 cm/s  LVOT VTI:     0.150 m  LVOT/AV VTI ratio: 0.20    AORTA  Ao Root diam: 3.67 cm   MR Peak grad:   149.8 mmHg TRICUSPID VALVE  MR Vmax:      612.05 cm/s TR Vmax:    368.15 cm/s  MR Vena Contracta: 0.41 cm  MR PISA:      2.69 cm  SHUNTS  MR PISA Radius:  0.65 cm   Systemic VTI: 0.15 m                 Systemic Diam: 2.41 cm   Dani Gobble Croitoru MD  Electronically signed by Sanda Klein MD  Signature Date/Time: 05/27/2020/11:31:27 AM       RIGHT/LEFT HEART CATH AND CORONARY/GRAFT ANGIOGRAPHY     Conclusion  1.  Severe native vessel coronary artery disease as detailed with multivessel disease involving an occluded right coronary artery, and occluded mid LAD, and multiple occluded obtuse marginal branches of the circumflex 2.  Status post aortocoronary bypass surgery with continued patency of the LIMA to LAD graft, saphenous vein graft to OM1, sequential saphenous vein graft to OM 2 and OM 3, and saphenous vein graft to PDA 3.  Moderately severe calcific aortic stenosis, possibly low-flow low gradient aortic stenosis with mean transvalvular gradient 25 mmHg  Continue multidisciplinary evaluation for consideration of TAVR.  Patient also underwent transesophageal echo today which suggested moderate mitral regurgitation and severe aortic stenosis.  He should undergo CTA studies followed by cardiac surgical consultation as part of a multidisciplinary approach to his care.  Surgeon Notes    05/27/2020 11:00 AM Op Note signed by Sanda Klein, MD    Indications  Severe aortic stenosis [I35.0 (ICD-10-CM)]   Procedural Details  Technical Details INDICATION: Severe aortic stenosis, CHF. Preop R/L heart cath study  PROCEDURAL DETAILS: There was an indwelling IV in a right antecubital vein. Using normal sterile technique, the IV  was changed out for a 6/7 Fr slender brachial sheath over a 0.018 inch wire.  A Swan-Ganz catheter was used for the right heart catheterization. Standard protocol was followed for recording of right heart pressures and sampling of oxygen saturations. Fick cardiac output was calculated.  Attention was then turned to left radial access.  The left radial artery was heavily calcified and diffusely diseased.  I made a attempt to access the vessel under ultrasound guidance but this did not work.  I never cannulated the artery.  Attention was then turned to the right groin.  Direct ultrasound guidance is used with ultrasound images digitally captured and stored in the  patient's chart.  A 5 French sheath is inserted.  Standard Judkins catheters were used for selective coronary angiography and bypass graft angiography. LV pressure is recorded and an aortic valve pullback is performed.  The aortic valve was crossed with a J-wire.  There were no immediate procedural complications. The patient was transferred to the post catheterization recovery area for further monitoring.    Estimated blood loss <50 mL.   During this procedure medications were administered to achieve and maintain moderate conscious sedation while the patient's heart rate, blood pressure, and oxygen saturation were continuously monitored and I was present face-to-face 100% of this time.   Medications (Filter: Administrations occurring from 1316 to 1442 on 05/27/20) (important) Continuous medications are totaled by the amount administered until 05/27/20 1442.    Heparin (Porcine) in NaCl 1000-0.9 UT/500ML-% SOLN (mL) Total volume:  1,000 mL  Date/Time Rate/Dose/Volume Action   05/27/20 1329 500 mL Given   1329 500 mL Given    lidocaine (PF) (XYLOCAINE) 1 % injection (mL) Total volume:  11 mL  Date/Time Rate/Dose/Volume Action   05/27/20 1349 1 mL Given   1354 2 mL Given   1407 8 mL Given    fentaNYL (SUBLIMAZE) injection (mcg) Total dose:  0 mcg  Date/Time Rate/Dose/Volume Action   05/27/20 1351 25 mcg Canceled Entry   1351 25 mcg Canceled Entry    midazolam (VERSED) injection (mg) Total dose:  1 mg  Date/Time Rate/Dose/Volume Action   05/27/20 1351 1 mg Given    iohexol (OMNIPAQUE) 350 MG/ML injection (mL) Total volume:  65 mL  Date/Time Rate/Dose/Volume Action   05/27/20 1428 65 mL Given    Sedation Time  Sedation Time Physician-1: 44 minutes 6 seconds   Contrast  Medication Name Total Dose  iohexol (OMNIPAQUE) 350 MG/ML injection 65 mL    Radiation/Fluoro  Fluoro time: 6.1 (min) DAP: 10650 (mGycm2) Cumulative Air Kerma: 175 (mGy)   Coronary  Findings   Diagnostic Dominance: Right  Left Anterior Descending  Prox LAD lesion is 95% stenosed.  Mid LAD lesion is 100% stenosed.  Left Circumflex  First Obtuse Marginal Branch  1st Mrg lesion is 100% stenosed.  Second Obtuse Marginal Branch  2nd Mrg lesion is 100% stenosed.  Right Coronary Artery  Prox RCA lesion is 100% stenosed. The lesion is calcified.  Saphenous Graft To RPDA  SVG. SVG-PDA patent, 2 prominent valves are present in the mid-body of the graft  LIMA LIMA Graft To Mid LAD  LIMA. LIMA-LAD widely patent  Saphenous Graft To 1st Mrg  SVG. SVG-OM 1 patent  Sequential Jump Graft Graft To 2nd Mrg, 3rd Mrg  Seq SVG- OM2 and OM3. graft widely patent   Intervention   No interventions have been documented.  Left Heart  Aortic Valve There is moderate aortic valve stenosis. The aortic valve is  calcified. There is restricted aortic valve motion. Mean gradient 25 mmHg, peak instantaneous gradient 44 mmHg, peak to peak gradient 28 mmHg, calculated aortic valve area 1.2 cm   Coronary Diagrams   Diagnostic Dominance: Right    Intervention    Implants    Vascular Products   Closure Mynx Control 2f [id number] - Implanted  Inventory item: CLOSURE [facility] CONTROL 51F Model/Cat number: [id number]  Manufacturer: [place] Lot number: [id number]  Device identifier: [id number] Device identifier type: GS1  Area Of Implantation: Femoral Artery     GUDID Information  Request status Successful    Brand name: MYNX CONTROL Version/Model: [id number]  Company name: [employer] safety info as of 05/27/20: MR Safe  Contains dry or latex rubber: No    GMDN P.T. name: Wound hydrogel dressing, non-antimicrobial     As of 05/27/2020  Status: Implanted        Syngo Images  Show images for CARDIAC CATHETERIZATION  Images on Long Term Storage  Show images for [place], Tre[place]hel to Procedure Log  Procedure Log     Hemo Data  Flowsheet  Row Most Recent Value  Fick Cardiac Output 4.87 L/min  Fick Cardiac Output Index 3.18 (L/min)/BSA  Aortic Mean Gradient 25.22 mmHg  Aortic Peak Gradient 28 mmHg  Aortic Valve Area 1.23  Aortic Value Area Index 0.8 cm2/BSA  RA A Wave 6 mmHg  RA V Wave 7 mmHg  RA Mean 5 mmHg  RV Systolic Pressure 40 mmHg  RV Diastolic Pressure 1 mmHg  RV EDP 6 mmHg  PA Systolic Pressure 43 mmHg  PA Diastolic Pressure 11 mmHg  PA Mean 21 mmHg  PW A Wave 14 mmHg  PW V Wave 16 mmHg  PW Mean 12 mmHg  AO Systolic Pressure 123456 mmHg  AO Diastolic Pressure 45 mmHg  AO Mean 70 mmHg  LV Systolic Pressure 123XX123 mmHg  LV Diastolic Pressure 2 mmHg  LV EDP 13 mmHg  AOp Systolic Pressure [id number] mmHg  AOp Diastolic Pressure 45 mmHg  AOp Mean Pressure 73 mmHg  LVp Systolic Pressure 123XX123 mmHg  LVp Diastolic Pressure 1 mmHg  LVp EDP Pressure 10 mmHg  QP/QS 1  TPVR Index 6.6 HRUI  TSVR Index 22.01 HRUI  PVR SVR Ratio 0.14  TPVR/TSVR Ratio 0.3    Cardiac TAVR CT  TECHNIQUE: The patient was scanned on a [employer]. A 120 kV retrospective scan was triggered in the descending thoracic aorta at 111 HU's. Gantry rotation speed was 250 msecs and collimation was .6 mm. No beta blockade or nitro were given. The 3D data set was reconstructed in 5% intervals of the R-R cycle. Systolic and diastolic phases were analyzed on a dedicated work station using MPR, MIP and VRT modes. The patient received 80 cc of contrast.  FINDINGS: Image quality: Excellent. The FOV was limited and did not include the entire left ventricle. TAVR analysis not affected.  Noise artifact is: Limited.  Valve Morphology: The aortic valve is tricuspid. The leaflets are severely calcified with diffuse calcifications. There is restricted movement in systole.  Aortic Valve Calcium score: 2718  Aortic annular dimension:  Phase assessed: 20%  Annular area: 573 mm2  Annular perimeter: 86.5 mm  Max diameter: 31.5  mm  Min diameter: 24.8 mm  Annular and subannular calcification: There is a trace calcification under the [place].  Optimal coplanar projection: LAO 17 CAU 11  Coronary Artery Height above Annulus:  Left Main:  18.7 mm  Right Coronary: 19.6 mm  Sinus of Valsalva Measurements:  Non-coronary: 36 mm  Right-coronary: 37 mm  Left-coronary: 37 mm  Sinus of Valsalva Height:  Non-coronary: 28.1 mm  Right-coronary: 23.0 mm  Left-coronary: 24.8 mm  Sinotubular Junction: 32 mm  Ascending Thoracic Aorta: 36 mm  Coronary Arteries: Normal coronary origin. Right dominance. The study was performed without use of NTG and is insufficient for plaque evaluation. Please refer to recent cardiac catheterization for coronary assessment. There are severe native coronary calcifications present. A patent LIMA to LAD is seen. A patent SVG to the RCA is seen. A patent SVG to an OM branch is seen.  Cardiac Morphology:  Right Atrium: Right atrial size is dilated. Pacemaker leads are seen in the RA and RV.  Right Ventricle: The right ventricular cavity is mildly dilated.  Left Atrium: Left atrial size is dilated in size with no left atrial appendage filling defect.  Left Ventricle: The ventricular cavity size is within normal limits. There are no stigmata of prior infarction. There is no abnormal filling defect. The posterior LV was not included in the FOV.  Pulmonary arteries: Normal in size without proximal filling defect.  Pulmonary veins: Normal pulmonary venous drainage.  Pericardium: Normal thickness with no significant effusion or calcium present.  Mitral Valve: The mitral valve is normal structure with moderate to severe calcification.  Extra-cardiac findings: See attached radiology report for non-cardiac structures.  IMPRESSION: 1. The FOV on this study did not include the entire LV cavity. TAVR analysis not affected.  2. Annular measurements  appropriate for 29 mm Edwards S3 TAVR (573 mm2).  3. Trace annular calcification.  4. Sufficient coronary to annulus distance.  5. Optimal Fluoroscopic Angle for Delivery: LAO 17 CAU [place] T. Audie Box, MD   Electronically Signed   By: Eleonore Chiquito   On: 06/11/2020 16:19    CT ANGIOGRAPHY CHEST, ABDOMEN AND PELVIS  TECHNIQUE: Multidetector CT imaging through the chest, abdomen and pelvis was performed using the standard protocol during bolus administration of intravenous contrast. Multiplanar reconstructed images and MIPs were obtained and reviewed to evaluate the vascular anatomy.  CONTRAST:  152mL OMNIPAQUE IOHEXOL 350 MG/ML SOLN  COMPARISON:  Chest CT 03/19/2017. CT the abdomen and pelvis 01/15/2017.  FINDINGS: CTA CHEST FINDINGS  Cardiovascular: Heart size is mildly enlarged. There is no significant pericardial fluid, thickening or pericardial calcification. There is aortic atherosclerosis, as well as atherosclerosis of the great vessels of the mediastinum and the coronary arteries, including calcified atherosclerotic plaque in the left main, left anterior descending, left circumflex and right coronary arteries. Status post median sternotomy for CABG including LIMA to the LAD. Severe thickening and calcification of the aortic valve. Calcifications of the mitral annulus. Left-sided pacemaker device in place with lead tips terminating in the right atrium and proximal aspect of the right ventricle.  Mediastinum/Lymph Nodes: Multiple prominent borderline enlarged and mildly enlarged mediastinal lymph nodes measuring up to 1.8 cm in short axis in the low right paratracheal nodal station, nonspecific. Esophagus is unremarkable in appearance. No axillary lymphadenopathy.  Lungs/Pleura: Patchy areas of ground-glass attenuation and interlobular septal thickening, suspicious for mild interstitial pulmonary edema. No acute consolidative airspace  disease. No pleural effusions. No definite suspicious appearing pulmonary nodules or masses are noted.  Musculoskeletal/Soft Tissues: There are no aggressive appearing lytic or blastic lesions noted in the visualized portions of the skeleton. Median sternotomy wires.  CTA ABDOMEN AND PELVIS FINDINGS  Hepatobiliary: No suspicious cystic or solid  hepatic lesions. No intra or extrahepatic biliary ductal dilatation. Gallbladder is normal in appearance.  Pancreas: No pancreatic mass. No pancreatic ductal dilatation. No pancreatic or peripancreatic fluid collections or inflammatory changes.  Spleen: Unremarkable.  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 1.1 cm low-attenuation lesion in the interpolar region of the left kidney, compatible with a simple cyst. Bilateral adrenal glands are normal in appearance. No hydroureteronephrosis. Urinary bladder is normal in appearance.  Stomach/Bowel: Normal appearance of the stomach. No pathologic dilatation of small bowel or colon. Numerous colonic diverticulae are noted, without surrounding inflammatory changes to suggest an acute diverticulitis at this time. Normal appendix.  Vascular/Lymphatic: Aortic atherosclerosis, without evidence of aneurysm or dissection in the abdominal or pelvic vasculature. Vascular findings and measurements pertinent to potential TAVR procedure, as detailed below. 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 No lymphadenopathy noted in the abdomen or pelvis.  Reproductive: Prostate gland and seminal vesicles are unremarkable in appearance.  Other: No significant volume of ascites.  No pneumoperitoneum.  Musculoskeletal: There are no aggressive appearing lytic or blastic lesions noted in the visualized portions of the skeleton.  VASCULAR MEASUREMENTS PERTINENT TO  TAVR:  AORTA:  Minimal Aortic Diameter-15 x 13 mm  Severity of Aortic Calcification-moderate to severe  RIGHT PELVIS:  Right Common Iliac Artery -  Minimal Diameter-8.4 x 7.7 mm  Tortuosity-mild  Calcification-moderate to severe  Right External Iliac Artery -  Minimal Diameter-6.6 x 6.9 mm  Tortuosity-moderate  Calcification-mild  Right Common Femoral Artery -  Minimal Diameter-7.3 x 5.9 mm  Tortuosity-mild  Calcification-moderate  LEFT PELVIS:  Left Common Iliac Artery -  Minimal Diameter-7.8 x 6.5 mm  Tortuosity-mild  Calcification-severe  Left External Iliac Artery -  Minimal Diameter-7.8 x 7.6 mm  Tortuosity-moderate  Calcification-mild  Left Common Femoral Artery -  Minimal Diameter-6.0 x 6.6 mm  Tortuosity-mild  Calcification-moderate  Review of the MIP images confirms the above findings.  IMPRESSION: 1. Vascular findings and measurements pertinent to potential TAVR procedure, as detailed above. 2. Severe thickening calcification of the aortic valve, compatible with reported clinical history of severe aortic stenosis. 3. Aortic atherosclerosis, in addition to left main and 3 vessel coronary artery disease. Status post median sternotomy for CABG including LIMA to the LAD. 4. 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 Clinical correlation for signs and symptoms of renovascular hypertension is recommended. 5. Cardiomegaly. Evidence of mild interstitial pulmonary edema in the lungs. Findings are concerning for mild congestive heart failure. 6. Colonic diverticulosis without evidence of acute diverticulitis at this time. 7. Additional incidental findings, as above.   Electronically Signed   By: Daniel  Entrikin M.D.   On: 06/11/2020 06:41    Impression:  Patient has stage D2 severe symptomatic aortic stenosis.  He was recently  hospitalized with acute exacerbation of chronic combined systolic and diastolic congestive heart failure, [place] Heart Association functional class IIIb-IV.  Symptoms of shortness of breath and pulmonary edema rapidly improved on medical therapy and the patient remains clinically stable at this time.  At present he reports no SOB at rest nor with low level activity, but he does have bibasilar rales on physical exam.  I have personally reviewed the patient's recent transthoracic and transesophageal echocardiograms, diagnostic cardiac catheterization, and CT angiograms.  Echocardiograms demonstrate the presence of moderately severe global left ventricular systolic dysfunction with ejection fraction ranging from   30 to 35%.  There is severe aortic stenosis.  The aortic valve is trileaflet with severe thickening, calcification, and restricted leaflet mobility involving all 3 leaflets of the aortic valve.  Peak velocity across the aortic valve on transthoracic echocardiogram measured as high as 3.9 m/s corresponding to mean transvalvular gradient estimated 35 mmHg corresponding to aortic valve area calculated 0.80 cm.  The DVI was remarkably low at 0.20.  At the time of transthoracic echocardiogram the patient also had severe mitral regurgitation and tricuspid regurgitation.  Follow-up transesophageal echocardiogram revealed similar findings although the severity of both mitral regurgitation and tricuspid regurgitation appeared somewhat improved, and functional anatomy revealed findings consistent with secondary (functional) mitral regurgitation.  Diagnostic cardiac catheterization revealed severe native coronary artery disease but continued patency of all 5 previously placed coronary artery bypass grafts performed by Dr. [name] at the time of the patient's bypass surgery in 1999.  Pulmonary artery pressures were moderately elevated.  I agree the patient needs aortic valve replacement.  Given the patient's  clinical history with recent sudden acute decompensation I remain hopeful that the patient's left ventricular systolic function, mitral regurgitation, and tricuspid regurgitation may all continue to gradually improve following successful aortic valve replacement.  However, I would not consider this elderly gentleman with moderate to severe global left ventricular systolic dysfunction and numerous other comorbid medical problems a candidate for conventional surgery.  Cardiac-gated CTA of the heart reveals anatomical characteristics consistent with aortic stenosis suitable for treatment by transcatheter aortic valve replacement without any significant complicating features and CTA of the aorta and iliac vessels demonstrate what appears to be adequate pelvic vascular access to facilitate a transfemoral approach.  The patient has previously undergone implantation of permanent pacemaker for sick sinus syndrome.    Plan:  The patient and his daughter were counseled at length regarding treatment alternatives for management of severe symptomatic aortic stenosis. Alternative approaches such as conventional aortic valve replacement, transcatheter aortic valve replacement, and continued medical therapy without intervention were compared and contrasted at length.  The risks associated with conventional surgical aortic valve replacement were discussed in detail, as were expectations for post-operative convalescence, and why I would be reluctant to consider this patient a candidate for conventional surgery.  Issues specific to transcatheter aortic valve replacement were discussed including questions about long term valve durability, the potential for paravalvular leak, possible increased risk of need for permanent pacemaker placement, and other technical complications related to the procedure itself.  Long-term prognosis with medical therapy was discussed. This discussion was placed in the context of the patient's own  specific clinical presentation and past medical history.  All of their questions have been addressed.  The patient desires to proceed with transcatheter aortic valve replacement as soon as practical.  We plan for surgery on June 28, 2020.  Following the decision to proceed with transcatheter aortic valve replacement, a discussion has been held regarding what types of management strategies would be attempted intraoperatively in the event of life-threatening complications, including whether or not the patient would be considered a candidate for the use of cardiopulmonary bypass and/or conversion to open sternotomy for attempted surgical intervention.  The patient specifically requests that should a potentially life-threatening complication develop we would not attempt emergency median sternotomy and/or other aggressive surgical procedures.  The patient has been advised of a variety of complications that might develop including but not limited to risks of death, stroke, paravalvular leak, aortic dissection or other major vascular complications, aortic annulus rupture, device  embolization, cardiac rupture or perforation, mitral regurgitation, acute myocardial infarction, arrhythmia, heart block or bradycardia requiring permanent pacemaker placement, congestive heart failure, respiratory failure, renal failure, pneumonia, infection, other late complications related to structural valve deterioration or migration, or other complications that might ultimately cause a temporary or permanent loss of functional independence or other long term morbidity.  The patient provides full informed consent for the procedure as described and all questions were answered.    I spent in excess of 90 minutes during the conduct of this office consultation and >50% of this time involved direct face-to-face encounter with the patient for counseling and/or coordination of their care.      Valentina Gu. Roxy Manns, MD 06/21/2020 4:14  PM

## 2020-06-21 NOTE — Patient Instructions (Signed)
Stop taking Eliquis on Thursday (06/23/2020)  Continue taking all other medications without change through the day before surgery.  Make sure to bring all of your medications with you when you come for your Pre-Admission Testing appointment at [facility] Short-Stay Department.  Have nothing to eat or drink after midnight the night before surgery.  On the morning of surgery do not take any medications.  At your appointment for Pre-Admission Testing at the [facility] Short-Stay Department you will be asked to sign permission forms for your upcoming surgery.  By definition your signature on these forms implies that you and/or your designee provide full informed consent for your planned surgical procedure(s), that alternative treatment options have been discussed, that you understand and accept any and all potential risks, and that you have some understanding of what to expect for your post-operative convalescence.  For any major cardiac surgical procedure potential operative risks include but are not limited to at least some risk of death, stroke or other neurologic complication, myocardial infarction, congestive heart failure, respiratory failure, renal failure, bleeding requiring blood transfusion and/or reexploration, irregular heart rhythm, heart block or bradycardia requiring permanent pacemaker, pneumonia, pericardial effusion, pleural effusion, wound infection, pulmonary embolus or other thromboembolic complication, chronic pain, or other complications related to the specific procedure(s) performed.  For transcatheter aortic valve replacement additional risks include but are not limited to risk of paravalvular leak, valve embolization, valve thrombosis, aortic dissection, aortic rupture, ventricular septal defect or perforation, pericardial tamponade, injury of the abdominal aorta or its branches, and/or injury or occlusion of the arteries going to your arms or  legs.  Please call to schedule a follow-up appointment in our office prior to surgery if you have any unresolved questions about your planned surgical procedure, the associated risks, alternative treatment options, and/or expectations for your post-operative recovery.

## 2020-06-22 ENCOUNTER — Other Ambulatory Visit: Payer: Self-pay | Admitting: Physician Assistant

## 2020-06-22 ENCOUNTER — Encounter (HOSPITAL_COMMUNITY): Payer: Self-pay

## 2020-06-22 ENCOUNTER — Encounter: Payer: Self-pay | Admitting: Thoracic Surgery (Cardiothoracic Vascular Surgery)

## 2020-06-22 ENCOUNTER — Encounter: Payer: Self-pay | Admitting: Cardiology

## 2020-06-22 DIAGNOSIS — I350 Nonrheumatic aortic (valve) stenosis: Secondary | ICD-10-CM

## 2020-06-22 NOTE — Progress Notes (Signed)
[facility] - [place], [place] - [address] [place] [address] Phone: [phone] Fax: [phone]  [facility][id number] [place], [place] - [address] AT [facility] [address] [place] [address] Phone: [phone] Fax: [phone]      Your procedure is scheduled on Tuesday, January 4th.  Report to [facility] Main Entrance "A" at 7:00 A.M., and check in at the Admitting office.  Call this number if you have problems the morning of surgery:  [phone]  Call [phone] if you have any questions prior to your surgery date Monday-Friday 8am-4pm    Remember:  Do not eat or drink after midnight the night before your surgery     Take these medicines the morning of surgery with A SIP OF WATER   Amiodarone (Pacerone)  Protonix   Follow your surgeon's instructions on when to stop Eliquis & Aspirin.  If no instructions were given by your surgeon then you will need to call the office to get those instructions.    As of today, STOP taking any Aspirin (unless otherwise instructed by your surgeon) Aleve, Naproxen, Ibuprofen, Motrin, Advil, Goody's, BC's, all herbal medications, fish oil, and all vitamins.   WHAT DO I DO ABOUT MY DIABETES MEDICATION?   Marland Kitchen Do not take oral diabetes medicines (pills) the morning of surgery. - Glimepiride (Amaryl) & Linagliptin (Tradjenta)   HOW TO MANAGE YOUR DIABETES BEFORE AND AFTER SURGERY  Why is it important to control my blood sugar before and after surgery? . Improving blood sugar levels before and after surgery helps healing and can limit problems. . A way of improving blood sugar control is eating a healthy diet by: o  Eating less sugar and carbohydrates o  Increasing activity/exercise o  Talking with your doctor about reaching your blood sugar goals . High blood sugars (greater than 180 mg/dL) can raise your risk of infections and slow your recovery, so you will  need to focus on controlling your diabetes during the weeks before surgery. . Make sure that the doctor who takes care of your diabetes knows about your planned surgery including the date and location.  How do I manage my blood sugar before surgery? . Check your blood sugar at least 4 times a day, starting 2 days before surgery, to make sure that the level is not too high or low. . Check your blood sugar the morning of your surgery when you wake up and every 2 hours until you get to the Short Stay unit. o If your blood sugar is less than 70 mg/dL, you will need to treat for low blood sugar: - Do not take insulin. - Treat a low blood sugar (less than 70 mg/dL) with  cup of clear juice (cranberry or apple), 4 glucose tablets, OR glucose gel. - Recheck blood sugar in 15 minutes after treatment (to make sure it is greater than 70 mg/dL). If your blood sugar is not greater than 70 mg/dL on recheck, call [phone] for further instructions. . Report your blood sugar to the short stay nurse when you get to Short Stay.  . If you are admitted to the hospital after surgery: o Your blood sugar will be checked by the staff and you will probably be given insulin after surgery (instead of oral diabetes medicines) to make sure you have good blood sugar levels. o The goal for blood sugar control after surgery is 80-180 mg/dL.  DAY OF SURGERY:                   Do not wear jewelry            Do not wear lotions, powders, colognes, or deodorant.            Men may shave face and neck.            Do not bring valuables to the hospital.            [facility] is not responsible for any belongings or valuables.  Do NOT Smoke (Tobacco/Vaping) or drink Alcohol 24 hours prior to your procedure If you use a CPAP at night, you may bring all equipment for your overnight stay.   Contacts, glasses, dentures or bridgework may not be worn into surgery.      For patients admitted to the hospital,  discharge time will be determined by your treatment team.   Patients discharged the day of surgery will not be allowed to drive home, and someone needs to stay with them for 24 hours.    Special instructions:   [place]- Preparing For Surgery  Before surgery, you can play an important role. Because skin is not sterile, your skin needs to be as free of germs as possible. You can reduce the number of germs on your skin by washing with CHG (chlorahexidine gluconate) Soap before surgery.  CHG is an antiseptic cleaner which kills germs and bonds with the skin to continue killing germs even after washing.    Oral Hygiene is also important to reduce your risk of infection.  Remember - BRUSH YOUR TEETH THE MORNING OF SURGERY WITH YOUR REGULAR TOOTHPASTE  Please do not use if you have an allergy to CHG or antibacterial soaps. If your skin becomes reddened/irritated stop using the CHG.  Do not shave (including legs and underarms) for at least 48 hours prior to first CHG shower. It is OK to shave your face.  Please follow these instructions carefully.   1. Shower the NIGHT BEFORE SURGERY and the MORNING OF SURGERY with CHG Soap.   2. If you chose to wash your hair, wash your hair first as usual with your normal shampoo.  3. After you shampoo, rinse your hair and body thoroughly to remove the shampoo.  4. Use CHG as you would any other liquid soap. You can apply CHG directly to the skin and wash gently with a scrungie or a clean washcloth.   5. Apply the CHG Soap to your body ONLY FROM THE NECK DOWN.  Do not use on open wounds or open sores. Avoid contact with your eyes, ears, mouth and genitals (private parts). Wash Face and genitals (private parts)  with your normal soap.   6. Wash thoroughly, paying special attention to the area where your surgery will be performed.  7. Thoroughly rinse your body with warm water from the neck down.  8. DO NOT shower/wash with your normal soap after using  and rinsing off the CHG Soap.  9. Pat yourself dry with a CLEAN TOWEL.  10. Wear CLEAN PAJAMAS to bed the night before surgery  11. Place CLEAN SHEETS on your bed the night of your first shower and DO NOT SLEEP WITH PETS.   Day of Surgery: Wear Clean/Comfortable clothing the morning of surgery Do not apply any deodorants/lotions.   Remember to brush your teeth WITH YOUR REGULAR TOOTHPASTE.   Please read over the following fact  sheets that you were given.

## 2020-06-22 NOTE — Progress Notes (Signed)
PERIOPERATIVE PRESCRIPTION FOR IMPLANTED CARDIAC DEVICE PROGRAMMING   Patient Information: Name: Bryan Carney, Bryan Carney  DOB: [birth year]  MRN: [id number]   Planned Procedure: TAVR & TEE  Surgeon: Excell Seltzer  Date of Procedure: 06/29/19  Cautery will be used.  Position during surgery: unknown   Please send documentation back to:  [facility] (Fax # [phone])   Derry Skill, RN  04/07/2020 2:39 PM   Device Information:   Clinic EP Physician:   Loman Brooklyn, MD Device Type:  Pacemaker Manufacturer and Phone #:  Medtronic: [phone] Pacemaker Dependent?:  No Date of Last Device Check:  05/31/20        Normal Device Function?:  Yes     Electrophysiologist's Recommendations:   [employer] industry Representative to be present for case.  Per Device Clinic Standing Orders, Uvaldo Rising  06/22/2020 10:45 AM

## 2020-06-23 ENCOUNTER — Ambulatory Visit: Payer: Medicare HMO | Admitting: Podiatry

## 2020-06-23 ENCOUNTER — Encounter (HOSPITAL_COMMUNITY): Payer: Self-pay

## 2020-06-23 ENCOUNTER — Encounter: Payer: Self-pay | Admitting: Physical Therapy

## 2020-06-23 ENCOUNTER — Ambulatory Visit: Payer: Medicare HMO | Attending: Cardiovascular Disease | Admitting: Physical Therapy

## 2020-06-23 ENCOUNTER — Ambulatory Visit (HOSPITAL_COMMUNITY)
Admission: RE | Admit: 2020-06-23 | Discharge: 2020-06-23 | Disposition: A | Discharge status: Home / Self Care | Payer: Medicare HMO | Source: Ambulatory Visit | Attending: Physician Assistant | Admitting: Physician Assistant

## 2020-06-23 ENCOUNTER — Encounter (HOSPITAL_COMMUNITY)
Admission: RE | Admit: 2020-06-23 | Discharge: 2020-06-23 | Disposition: A | Discharge status: Home / Self Care | Payer: Medicare HMO | Source: Ambulatory Visit | Attending: Cardiovascular Disease | Admitting: Cardiovascular Disease

## 2020-06-23 ENCOUNTER — Other Ambulatory Visit: Payer: Self-pay

## 2020-06-23 DIAGNOSIS — I350 Nonrheumatic aortic (valve) stenosis: Secondary | ICD-10-CM

## 2020-06-23 DIAGNOSIS — R2689 Other abnormalities of gait and mobility: Secondary | ICD-10-CM | POA: Insufficient documentation

## 2020-06-23 DIAGNOSIS — Z01818 Encounter for other preprocedural examination: Secondary | ICD-10-CM | POA: Insufficient documentation

## 2020-06-23 DIAGNOSIS — I110 Hypertensive heart disease with heart failure: Secondary | ICD-10-CM | POA: Diagnosis not present

## 2020-06-23 DIAGNOSIS — Z951 Presence of aortocoronary bypass graft: Secondary | ICD-10-CM | POA: Diagnosis not present

## 2020-06-23 DIAGNOSIS — I480 Paroxysmal atrial fibrillation: Secondary | ICD-10-CM | POA: Insufficient documentation

## 2020-06-23 DIAGNOSIS — E785 Hyperlipidemia, unspecified: Secondary | ICD-10-CM | POA: Insufficient documentation

## 2020-06-23 DIAGNOSIS — Z79899 Other long term (current) drug therapy: Secondary | ICD-10-CM | POA: Diagnosis not present

## 2020-06-23 DIAGNOSIS — J479 Bronchiectasis, uncomplicated: Secondary | ICD-10-CM | POA: Insufficient documentation

## 2020-06-23 DIAGNOSIS — I2510 Atherosclerotic heart disease of native coronary artery without angina pectoris: Secondary | ICD-10-CM | POA: Diagnosis not present

## 2020-06-23 DIAGNOSIS — Z7901 Long term (current) use of anticoagulants: Secondary | ICD-10-CM | POA: Diagnosis not present

## 2020-06-23 DIAGNOSIS — Z01811 Encounter for preprocedural respiratory examination: Secondary | ICD-10-CM | POA: Diagnosis not present

## 2020-06-23 DIAGNOSIS — E119 Type 2 diabetes mellitus without complications: Secondary | ICD-10-CM | POA: Diagnosis not present

## 2020-06-23 DIAGNOSIS — I509 Heart failure, unspecified: Secondary | ICD-10-CM | POA: Diagnosis not present

## 2020-06-23 DIAGNOSIS — I700 Atherosclerosis of aorta: Secondary | ICD-10-CM | POA: Insufficient documentation

## 2020-06-23 LAB — BRAIN NATRIURETIC PEPTIDE: B Natriuretic Peptide: 675.4 pg/mL — ABNORMAL HIGH (ref 0.0–100.0)

## 2020-06-23 LAB — COMPREHENSIVE METABOLIC PANEL
ALT: 19 U/L (ref 0–44)
AST: 21 U/L (ref 15–41)
Albumin: 3.6 g/dL (ref 3.5–5.0)
Alkaline Phosphatase: 85 U/L (ref 38–126)
Anion gap: 11 (ref 5–15)
BUN: 33 mg/dL — ABNORMAL HIGH (ref 8–23)
CO2: 23 mmol/L (ref 22–32)
Calcium: 8.5 mg/dL — ABNORMAL LOW (ref 8.9–10.3)
Chloride: 102 mmol/L (ref 98–111)
Creatinine, Ser: 1.86 mg/dL — ABNORMAL HIGH (ref 0.61–1.24)
GFR, Estimated: 36 mL/min — ABNORMAL LOW (ref >60)
Glucose, Bld: 279 mg/dL — ABNORMAL HIGH (ref 70–99)
Potassium: 4.1 mmol/L (ref 3.5–5.1)
Sodium: 136 mmol/L (ref 135–145)
Total Bilirubin: 0.6 mg/dL (ref 0.3–1.2)
Total Protein: 6.3 g/dL — ABNORMAL LOW (ref 6.5–8.1)

## 2020-06-23 LAB — CBC
HCT: 33.4 % — ABNORMAL LOW (ref 39.0–52.0)
Hemoglobin: 10.7 g/dL — ABNORMAL LOW (ref 13.0–17.0)
MCH: 32.6 pg (ref 26.0–34.0)
MCHC: 32 g/dL (ref 30.0–36.0)
MCV: 101.8 fL — ABNORMAL HIGH (ref 80.0–100.0)
Platelets: 195 10*3/uL (ref 150–400)
RBC: 3.28 MIL/uL — ABNORMAL LOW (ref 4.22–5.81)
RDW: 15 % (ref 11.5–15.5)
WBC: 6.4 10*3/uL (ref 4.0–10.5)
nRBC: 0 % (ref 0.0–0.2)

## 2020-06-23 LAB — TYPE AND SCREEN
ABO/RH(D): O NEG
Antibody Screen: NEGATIVE

## 2020-06-23 LAB — BLOOD GAS, ARTERIAL
Acid-Base Excess: 0.6 mmol/L (ref 0.0–2.0)
Bicarbonate: 24.6 mmol/L (ref 20.0–28.0)
Drawn by: 602861
FIO2: 21
O2 Saturation: 98.4 %
Patient temperature: 37
pCO2 arterial: 39 mmHg (ref 32.0–48.0)
pH, Arterial: 7.417 (ref 7.350–7.450)
pO2, Arterial: 109 mmHg — ABNORMAL HIGH (ref 83.0–108.0)

## 2020-06-23 LAB — URINALYSIS, ROUTINE W REFLEX MICROSCOPIC
Bilirubin Urine: NEGATIVE
Glucose, UA: 150 mg/dL — AB
Hgb urine dipstick: NEGATIVE
Ketones, ur: NEGATIVE mg/dL
Leukocytes,Ua: NEGATIVE
Nitrite: NEGATIVE
Protein, ur: NEGATIVE mg/dL
Specific Gravity, Urine: 1.009 (ref 1.005–1.030)
pH: 5 (ref 5.0–8.0)

## 2020-06-23 LAB — PROTIME-INR
INR: 1.1 (ref 0.8–1.2)
Prothrombin Time: 13.5 seconds (ref 11.4–15.2)

## 2020-06-23 LAB — HEMOGLOBIN A1C
Hgb A1c MFr Bld: 6.1 % — ABNORMAL HIGH (ref 4.8–5.6)
Mean Plasma Glucose: 128.37 mg/dL

## 2020-06-23 LAB — SURGICAL PCR SCREEN
MRSA, PCR: NEGATIVE
Staphylococcus aureus: POSITIVE — AB

## 2020-06-23 LAB — APTT: aPTT: 32 seconds (ref 24–36)

## 2020-06-23 LAB — GLUCOSE, CAPILLARY: Glucose-Capillary: 305 mg/dL — ABNORMAL HIGH (ref 70–99)

## 2020-06-23 NOTE — Progress Notes (Signed)
PCP - Gaye Alken, NP Cardiologist - D. Salley Hews  PPM/ICD - Pacemaker (Medtronic) Device Orders - have been requested Rep Notified - yes, Guerry Bruin  Chest x-ray - today EKG - today ECHO - 05/03/20 Cardiac Cath - 05/27/20   Today's Blood Sugar - 305 (pt ate Cinnamon Toast Crunch Cereal this AM  Checks Blood Sugar ___1__ times a day (fasting blood sugar is usually around 78  Blood Thinner Instructions: Eliquis, to hold starting 12/29 per Dr. Earmon Phoenix instructions. Last dose was Wednesday, 06/22/20 pm dose Aspirin Instructions: to continue until day of surgery   COVID TEST- scheduled for 06/28/19   Anesthesia review: yes - TAVR  Patient denies shortness of breath, fever, cough and chest pain at PAT appointment   All instructions explained to the patient, with a verbal understanding of the material. Patient agrees to go over the instructions while at home for a better understanding. Patient also instructed to self quarantine after being tested for COVID-19. The opportunity to ask questions was provided.

## 2020-06-23 NOTE — Pre-Procedure Instructions (Signed)
CLAY SOLUM  06/23/2020    Your procedure is scheduled on Tuesday, June 29, 2019 at 9:00 AM.   Report to [facility] Entrance "A" Admitting Office at 7:00 AM.   Call this number if you have problems the morning of surgery: [phone]   Questions prior to day of surgery, please call [phone] between 8 & 4 PM.   Remember:  Do not eat or drink after midnight Monday, 06/28/19.  Take these medicines the morning of surgery with A SIP OF WATER: None  Stop Eliquis as instructed by Dr. Excell Seltzer. Stop Herbal medications (Elderberry) as of today prior to surgery. Do not use other Aspirin containing products, NSAIDS (Ibuprofen, Aleve, etc), Multivitamins or Fish Oil prior to surgery.   How to Manage Your Diabetes Before Surgery   Why is it important to control my blood sugar before and after surgery?   Improving blood sugar levels before and after surgery helps healing and can limit problems.  A way of improving blood sugar control is eating a healthy diet by:  - Eating less sugar and carbohydrates  - Increasing activity/exercise  - Talk with your doctor about reaching your blood sugar goals  High blood sugars (greater than 180 mg/dL) can raise your risk of infections and slow down your recovery so you will need to focus on controlling your diabetes during the weeks before surgery.  Make sure that the doctor who takes care of your diabetes knows about your planned surgery including the date and location.  How do I manage my blood sugars before surgery?   Check your blood sugar at least 4 times a day, 2 days before surgery to make sure that they are not too high or low.  Check your blood sugar the morning of your surgery when you wake up and every 2 hours until you get to the Short-Stay unit.  Treat a low blood sugar (less than 70 mg/dL) with 1/2 cup of clear juice (cranberry or apple), 4 glucose tablets, OR glucose gel.  Recheck blood sugar in 15 minutes after treatment  (to make sure it is greater than 70 mg/dL).  If blood sugar is not greater than 70 mg/dL on re-check, call [phone] for further instructions.   Report your blood sugar to the Short-Stay nurse when you get to Short-Stay.  References:  [facility], 2007 "How to Manage your Diabetes Before and After Surgery".  What do I do about my diabetes medications?   Do not take oral diabetes medicines (pills) the morning of surgery.   Do not wear jewelry.  Do not wear lotions, powders, cologne or deodorant.  Men may shave face and neck.  Do not bring valuables to the hospital.  [facility] is not responsible for any belongings or valuables.  Contacts, dentures or bridgework may not be worn into surgery.  Leave your suitcase in the car.  After surgery it may be brought to your room.  For patients admitted to the hospital, discharge time will be determined by your treatment team.  [facility] - Preparing for Surgery  Before surgery, you can play an important role.  Because skin is not sterile, your skin needs to be as free of germs as possible.  You can reduce the number of germs on you skin by washing with CHG (chlorahexidine gluconate) soap before surgery.  CHG is an antiseptic cleaner which kills germs and bonds with the skin to continue killing germs even after washing.  Oral Hygiene  is also important in reducing the risk of infection.  Remember to brush your teeth with your regular toothpaste the morning of surgery.  Please DO NOT use if you have an allergy to CHG or antibacterial soaps.  If your skin becomes reddened/irritated stop using the CHG and inform your nurse when you arrive at Short Stay.  Do not shave (including legs and underarms) for at least 48 hours prior to the first CHG shower.  You may shave your face.  Please follow these instructions carefully:   1.  Shower with CHG Soap the night before surgery and the morning of Surgery.  2.  If you choose to  wash your hair, wash your hair first as usual with your normal shampoo.  3.  After you shampoo, rinse your hair and body thoroughly to remove the shampoo. 4.  Use CHG as you would any other liquid soap.  You can apply chg directly to the skin and wash gently with a      scrungie or washcloth.           5.  Apply the CHG Soap to your body ONLY FROM THE NECK DOWN.   Do not use on open wounds or open sores. Avoid contact with your eyes, ears, mouth and genitals (private parts).  Wash genitals (private parts) with your normal soap - do this prior to using CHG soap.  6.  Wash thoroughly, paying special attention to the area where your surgery will be performed.  7.  Thoroughly rinse your body with warm water from the neck down.  8.  DO NOT shower/wash with your normal soap after using and rinsing off the CHG Soap.  9.  Pat yourself dry with a clean towel.            10.  Wear clean pajamas.            11.  Place clean sheets on your bed the night of your first shower and do not sleep with pets.  Day of Surgery  Shower as above. Do not apply any lotions/deodorants the morning of surgery.   Please wear clean clothes to the hospital. Remember to brush your teeth with toothpaste.  Please read over the fact sheets that you were given.

## 2020-06-23 NOTE — Therapy (Signed)
[facility] [address] [place], [place], [address] Phone: [phone]   Fax:  [phone]  Physical Therapy Evaluation  Patient Details  Name: Bryan Carney MRN: [id number] Date of Birth: 08/22/1940 No data recorded  Encounter Date: 06/23/2020   PT End of Session - 06/23/20 1110    Visit Number 1    Number of Visits 12    Date for PT Re-Evaluation 08/04/20    PT Start Time 1100    PT Stop Time 1135    PT Time Calculation (min) 35 min    Activity Tolerance Patient tolerated treatment well    Behavior During Therapy [facility] for tasks assessed/performed           Past Medical History:  Diagnosis Date  . Anemia   . Anginal pain (HCC)   . Arthritis   . CAD (coronary artery disease)   . CHF (congestive heart failure) (HCC)   . Chronic kidney disease    stage 3 per daughter  . COPD (chronic obstructive pulmonary disease) (HCC)   . Diabetes (HCC)   . Dyspnea   . Dysrhythmia   . Fibromyalgia    25 years ago.  Marland Kitchen Heart murmur   . Hx pulmonary embolism   . Hyperlipidemia   . Hypertension   . Myocardial infarction (HCC)   . Pneumonia   . Presence of permanent cardiac pacemaker   . Prostate cancer (HCC)   . PVOD (pulmonary veno-occlusive disease) (HCC)   . S/P CABG x 5 10/19/1997   LIMA to LAD, SVG to ramus, SVG to OM2-OM3, SVG to PDA  . Squamous cell carcinoma   . Stroke [facility])    22 years ago no deficits noted.    Past Surgical History:  Procedure Laterality Date  . BACK SURGERY    . CARDIAC CATHETERIZATION  10/15/1997   Recommend complete revascularization by CABG  . CARDIOVASCULAR STRESS TEST  06/12/2012   No evidence of ischemia  . CAROTID DOPPLER  08/27/2012   Rt bulb/proximal ICA demonstrated a mild-moderate amount of fibrous plaque without evidence of a significant diameter reduction or any other vascular abnormality.  . CAROTID ENDARTERECTOMY Left 02/23/1995  . CORONARY ANGIOPLASTY    . CORONARY ARTERY BYPASS  GRAFT  10/19/1997   x5. LIMA-LAD, SVG-PDA, SVG-OM1, seq SVG-fist and second branches of ramus intermedius.  Marland Kitchen HERNIA REPAIR     groin area.  Jobie Quaker / REPLACE / REMOVE PACEMAKER    . NO PAST SURGERIES    . PACEMAKER IMPLANT N/A 02/27/2018   Procedure: PACEMAKER IMPLANT;  Surgeon: Regan Lemming, MD;  Location: [facility] INVASIVE CV LAB;  Service: Cardiovascular;  Laterality: N/A;  . RIGHT/LEFT HEART CATH AND CORONARY/GRAFT ANGIOGRAPHY N/A 05/27/2020   Procedure: RIGHT/LEFT HEART CATH AND CORONARY/GRAFT ANGIOGRAPHY;  Surgeon: Tonny Bollman, MD;  Location: [facility] INVASIVE CV LAB;  Service: Cardiovascular;  Laterality: N/A;  . TEE WITHOUT CARDIOVERSION N/A 05/27/2020   Procedure: TRANSESOPHAGEAL ECHOCARDIOGRAM (TEE);  Surgeon: Thurmon Fair, MD;  Location: [facility] ENDOSCOPY;  Service: Cardiovascular;  Laterality: N/A;    There were no vitals filed for this visit.    Subjective Assessment - 06/23/20 1105    Subjective Patient began having shortness of breath for several weeks now when ambulating.    Pertinent History COPD, CHF, CHF, Pulmonary Embolism    How long can you walk comfortably? becomes short of breath    Currently in Pain? No/denies  [facility] PT Assessment - 06/23/20 0001      ROM / Strength   AROM / PROM / Strength AROM;PROM;Strength      Strength   Overall Strength Comments 5/5 gross    Strength Assessment Site Hand    Right/Left hand Right;Left            [facility] Pre-Surgical Assessment - 06/23/20 0001    5 Meter Walk Test- trial 1 5 sec    5 Meter Walk Test- trial 2 5 sec.     5 Meter Walk Test- trial 3 6 sec.    5 meter walk test average 5.33 sec    4 Stage Balance Test tolerated for:  10 sec.    4 Stage Balance Test Position 3    comment could not maintain balance on 1 foot    ADL/IADL Independent with: Bathing;Dressing;Meal prep;Finances;Yard work    6 Minute Walk- Baseline yes    BP (mmHg) 130/66    HR (bpm) 66    02 Sat (%RA) 98 %    Modified Borg  Scale for Dyspnea 0- Nothing at all    Perceived Rate of Exertion (Borg) 6-    6 Minute Walk Post Test yes    BP (mmHg) 148/74    HR (bpm) 110    02 Sat (%RA) 98 %    Modified Borg Scale for Dyspnea 1- Very mild shortness of breath    Perceived Rate of Exertion (Borg) 13- Somewhat hard    Aerobic Endurance Distance Walked 985    Endurance additional comments 1 seated rest break at 36min for 1:30 HR 110 Patient rpeorted his ankle felt weak and this was normal.  28% disability                    Objective measurements completed on examination: See above findings.                            Plan - 06/23/20 1519    Clinical Impression Statement See below    Stability/Clinical Decision Making Stable/Uncomplicated    Clinical Decision Making Low    Rehab Potential Excellent    PT Frequency One time visit    PT Treatment/Interventions Patient/family education    Consulted and Agree with Plan of Care Patient           Patient will benefit from skilled therapeutic intervention in order to improve the following deficits and impairments:     Visit Diagnosis: Other abnormalities of gait and mobility   Clinical Impression Statement: Pt is a 79 yo male presenting to OP PT for evaluation prior to possible TAVR surgery due to severe aortic stenosis. Pt reports onset of dypnea approximately 5 weeks months ago. Symptoms are limiting ability to ambualte. Pt presents with normal ROM and strength, decreased balance and is not at high fall risk 4 stage balance test, decrease walking speed and decreased  aerobic endurance per 6 minute walk test. Pt ambulated 925 feet in 44min  before requesting a seated rest beak lasting 1:30. At time of rest, patient's HR was 110 bpm and O2 was 97 on room air. Pt reported 1/10 shortness of breath on modified scale for dyspnea. Pt able to resume after rest and ambulate an additional 60 feet. Pt ambulated a total of 985 feet in 6 minute  walk. B/P increased significantly with 6 minute walk test. Based on the Short Physical Performance Battery, patient has  a frailty rating of 12/12 with </= 5/12 considered frail.    Problem List Patient Active Problem List   Diagnosis Date Noted  . Tricuspid regurgitation   . Non-rheumatic mitral regurgitation   . CHF exacerbation ([place]) 05/12/2020  . Tachy-brady syndrome ([place]) 02/27/2018  . Degeneration of lumbar intervertebral disc 12/31/2017  . Aortic valve stenosis, nonrheumatic 12/17/2017  . Low back pain 12/06/2017  . Aspiration pneumonia of left lower lobe due to gastric secretions ([place]) 03/29/2017  . Debility 03/27/2017  . Chest wall hematoma, right, sequela   . AKI (acute kidney injury) ([place])   . Leukocytosis   . Delirium   . Clot   . Hypoxia   . Lethargic   . Acute delirium   . Coronary artery disease involving native coronary artery of native heart without angina pectoris   . Paroxysmal atrial fibrillation (HCC)   . Coagulopathy ([place])   . Acute blood loss anemia 03/20/2017  . Hematoma 03/20/2017  . Hyponatremia 03/20/2017  . Supratherapeutic INR 03/20/2017  . Prostate cancer ([place]) 08/17/2014  . Essential hypertension 07/07/2014  . Hyperlipidemia 07/07/2014  . Coronary artery disease due to lipid rich plaque 07/07/2014  . Carotid artery disease ([place]) 07/07/2014  . Type 2 diabetes mellitus without complication, without long-term current use of insulin ([place]) 07/07/2014  . Long term (current) use of anticoagulants 10/14/2012  . Acute pulmonary embolism ([place]) 10/14/2012  . S/P CABG x 5 10/19/1997    Carney Living 06/23/2020, 3:38 PM  [facility] [address] [place], [place], [address] Phone: [phone]   Fax:  [phone]  Name: Bryan Carney MRN: [id number] Date of Birth: Nov 30, 1940

## 2020-06-27 ENCOUNTER — Encounter (HOSPITAL_COMMUNITY): Payer: Self-pay | Admitting: Certified Registered Nurse Anesthetist

## 2020-06-27 ENCOUNTER — Encounter (HOSPITAL_COMMUNITY): Payer: Self-pay | Admitting: Physician Assistant

## 2020-06-27 ENCOUNTER — Other Ambulatory Visit (HOSPITAL_COMMUNITY)
Admission: RE | Admit: 2020-06-27 | Discharge: 2020-06-27 | Disposition: A | Discharge status: Home / Self Care | Payer: Medicare HMO | Source: Ambulatory Visit | Attending: Cardiovascular Disease | Admitting: Cardiovascular Disease

## 2020-06-27 DIAGNOSIS — U071 COVID-19: Secondary | ICD-10-CM | POA: Insufficient documentation

## 2020-06-27 DIAGNOSIS — Z01818 Encounter for other preprocedural examination: Secondary | ICD-10-CM | POA: Diagnosis not present

## 2020-06-27 LAB — SARS CORONAVIRUS 2 (TAT 6-24 HRS): SARS Coronavirus 2: POSITIVE — AB

## 2020-06-27 MED ORDER — SODIUM CHLORIDE 0.9 % IV SOLN
INTRAVENOUS | Status: DC
  Filled 2020-06-27 (×2): qty 30

## 2020-06-27 MED ORDER — NOREPINEPHRINE 4 MG/250ML-% IV SOLN
0.0000 ug/min | INTRAVENOUS | Status: DC
  Filled 2020-06-27: qty 250

## 2020-06-27 MED ORDER — MAGNESIUM SULFATE 50 % IJ SOLN
40.0000 meq | INTRAMUSCULAR | Status: DC
  Filled 2020-06-27 (×2): qty 9.85

## 2020-06-27 MED ORDER — SODIUM CHLORIDE 0.9 % IV SOLN
1.5000 g | INTRAVENOUS | Status: DC
  Filled 2020-06-27 (×2): qty 1.5

## 2020-06-27 MED ORDER — VANCOMYCIN HCL 1250 MG/250ML IV SOLN
1250.0000 mg | INTRAVENOUS | Status: DC
  Filled 2020-06-27 (×2): qty 250

## 2020-06-27 MED ORDER — DEXMEDETOMIDINE HCL IN NACL 400 MCG/100ML IV SOLN
0.1000 ug/kg/h | INTRAVENOUS | Status: DC
  Filled 2020-06-27 (×2): qty 100

## 2020-06-27 MED ORDER — POTASSIUM CHLORIDE 2 MEQ/ML IV SOLN
80.0000 meq | INTRAVENOUS | Status: DC
  Filled 2020-06-27 (×2): qty 40

## 2020-06-28 ENCOUNTER — Inpatient Hospital Stay (HOSPITAL_COMMUNITY): Admission: RE | Admit: 2020-06-28 | Payer: Medicare HMO | Source: Home / Self Care | Admitting: Cardiovascular Disease

## 2020-06-28 ENCOUNTER — Telehealth: Payer: Self-pay | Admitting: Physician Assistant

## 2020-06-28 ENCOUNTER — Encounter (HOSPITAL_COMMUNITY): Admission: RE | Payer: Medicare HMO | Source: Home / Self Care

## 2020-06-28 DIAGNOSIS — I350 Nonrheumatic aortic (valve) stenosis: Secondary | ICD-10-CM

## 2020-06-28 SURGERY — IMPLANTATION, AORTIC VALVE, TRANSCATHETER, FEMORAL APPROACH
Anesthesia: General

## 2020-06-28 NOTE — Telephone Encounter (Signed)
Called to discuss with Reche Dixon about Covid symptoms and the use of sotrovimab, remdisivir or oral therapies for those with mild to moderate Covid symptoms and at a high risk of hospitalization.     Pt does not qualify as he has asymptomatic infection at this time. He had a screening covid test yesterday (06/28/19) in anticipation of a cardiac surgery today, which has been cancelled. The pt is fully vaccinated including a booster. If he goes on to develop symptoms, he has my phone number and I will get him set up for indicated therapy at that time.    Patient Active Problem List   Diagnosis Date Noted  . Tricuspid regurgitation   . Non-rheumatic mitral regurgitation   . CHF exacerbation (HCC) 05/12/2020  . Tachy-brady syndrome (HCC) 02/27/2018  . Degeneration of lumbar intervertebral disc 12/31/2017  . Aortic valve stenosis, nonrheumatic 12/17/2017  . Low back pain 12/06/2017  . Aspiration pneumonia of left lower lobe due to gastric secretions (HCC) 03/29/2017  . Debility 03/27/2017  . Chest wall hematoma, right, sequela   . AKI (acute kidney injury) (HCC)   . Leukocytosis   . Delirium   . Clot   . Hypoxia   . Lethargic   . Acute delirium   . Coronary artery disease involving native coronary artery of native heart without angina pectoris   . Paroxysmal atrial fibrillation (HCC)   . Coagulopathy (HCC)   . Acute blood loss anemia 03/20/2017  . Hematoma 03/20/2017  . Hyponatremia 03/20/2017  . Supratherapeutic INR 03/20/2017  . Prostate cancer (HCC) 08/17/2014  . Essential hypertension 07/07/2014  . Hyperlipidemia 07/07/2014  . Coronary artery disease due to lipid rich plaque 07/07/2014  . Carotid artery disease (HCC) 07/07/2014  . Type 2 diabetes mellitus without complication, without long-term current use of insulin (HCC) 07/07/2014  . Long term (current) use of anticoagulants 10/14/2012  . Acute pulmonary embolism (HCC) 10/14/2012  . S/P CABG x 5 10/19/1997    Cline Crock PA-C

## 2020-06-28 NOTE — Progress Notes (Signed)
Notified Dr Excell Seltzer that patient's Covid test is + from 1/3.  Attempted to call patient regarding + Covid test from 1/3, no answer.  Called the daughter Velora Mediate,  Let her know that patients Covid test was + from yesterday.  She states her daughter tested + Saturday and that she tested + yesterday.  She said as far as she know her dad is not having any symptoms.  The TAVR team will follow up with him later this morning.  The TAVR for this AM is canceled per Dr Excell Seltzer.

## 2020-06-29 ENCOUNTER — Ambulatory Visit: Payer: Medicare HMO | Admitting: Gastroenterology

## 2020-06-30 ENCOUNTER — Ambulatory Visit: Payer: Medicare HMO | Admitting: Gastroenterology

## 2020-07-01 ENCOUNTER — Other Ambulatory Visit: Payer: Self-pay

## 2020-07-01 DIAGNOSIS — I350 Nonrheumatic aortic (valve) stenosis: Secondary | ICD-10-CM

## 2020-07-08 ENCOUNTER — Encounter: Payer: Self-pay | Admitting: Cardiology

## 2020-07-08 ENCOUNTER — Other Ambulatory Visit: Payer: Self-pay

## 2020-07-08 ENCOUNTER — Encounter (HOSPITAL_COMMUNITY)
Admission: RE | Admit: 2020-07-08 | Discharge: 2020-07-08 | Disposition: A | Discharge status: Home / Self Care | Payer: Medicare HMO | Source: Ambulatory Visit | Attending: Cardiovascular Disease | Admitting: Cardiovascular Disease

## 2020-07-08 ENCOUNTER — Encounter (HOSPITAL_COMMUNITY): Payer: Self-pay

## 2020-07-08 DIAGNOSIS — I083 Combined rheumatic disorders of mitral, aortic and tricuspid valves: Secondary | ICD-10-CM | POA: Insufficient documentation

## 2020-07-08 DIAGNOSIS — N183 Chronic kidney disease, stage 3 unspecified: Secondary | ICD-10-CM | POA: Diagnosis not present

## 2020-07-08 DIAGNOSIS — Z951 Presence of aortocoronary bypass graft: Secondary | ICD-10-CM | POA: Insufficient documentation

## 2020-07-08 DIAGNOSIS — Z01812 Encounter for preprocedural laboratory examination: Secondary | ICD-10-CM | POA: Insufficient documentation

## 2020-07-08 DIAGNOSIS — Z01818 Encounter for other preprocedural examination: Secondary | ICD-10-CM

## 2020-07-08 DIAGNOSIS — I130 Hypertensive heart and chronic kidney disease with heart failure and stage 1 through stage 4 chronic kidney disease, or unspecified chronic kidney disease: Secondary | ICD-10-CM | POA: Diagnosis not present

## 2020-07-08 DIAGNOSIS — E1122 Type 2 diabetes mellitus with diabetic chronic kidney disease: Secondary | ICD-10-CM | POA: Diagnosis not present

## 2020-07-08 DIAGNOSIS — I252 Old myocardial infarction: Secondary | ICD-10-CM | POA: Diagnosis not present

## 2020-07-08 DIAGNOSIS — Z86711 Personal history of pulmonary embolism: Secondary | ICD-10-CM | POA: Insufficient documentation

## 2020-07-08 DIAGNOSIS — Z7901 Long term (current) use of anticoagulants: Secondary | ICD-10-CM | POA: Insufficient documentation

## 2020-07-08 DIAGNOSIS — I350 Nonrheumatic aortic (valve) stenosis: Secondary | ICD-10-CM | POA: Diagnosis not present

## 2020-07-08 DIAGNOSIS — Z950 Presence of cardiac pacemaker: Secondary | ICD-10-CM | POA: Diagnosis not present

## 2020-07-08 DIAGNOSIS — Z79899 Other long term (current) drug therapy: Secondary | ICD-10-CM | POA: Diagnosis not present

## 2020-07-08 DIAGNOSIS — I2510 Atherosclerotic heart disease of native coronary artery without angina pectoris: Secondary | ICD-10-CM | POA: Insufficient documentation

## 2020-07-08 DIAGNOSIS — J449 Chronic obstructive pulmonary disease, unspecified: Secondary | ICD-10-CM | POA: Insufficient documentation

## 2020-07-08 DIAGNOSIS — I517 Cardiomegaly: Secondary | ICD-10-CM | POA: Diagnosis not present

## 2020-07-08 DIAGNOSIS — Z7984 Long term (current) use of oral hypoglycemic drugs: Secondary | ICD-10-CM | POA: Insufficient documentation

## 2020-07-08 DIAGNOSIS — Z7982 Long term (current) use of aspirin: Secondary | ICD-10-CM | POA: Insufficient documentation

## 2020-07-08 DIAGNOSIS — E785 Hyperlipidemia, unspecified: Secondary | ICD-10-CM | POA: Diagnosis not present

## 2020-07-08 LAB — CBC
HCT: 36.6 % — ABNORMAL LOW (ref 39.0–52.0)
Hemoglobin: 11.6 g/dL — ABNORMAL LOW (ref 13.0–17.0)
MCH: 32.3 pg (ref 26.0–34.0)
MCHC: 31.7 g/dL (ref 30.0–36.0)
MCV: 101.9 fL — ABNORMAL HIGH (ref 80.0–100.0)
Platelets: 170 10*3/uL (ref 150–400)
RBC: 3.59 MIL/uL — ABNORMAL LOW (ref 4.22–5.81)
RDW: 14.5 % (ref 11.5–15.5)
WBC: 5.6 10*3/uL (ref 4.0–10.5)
nRBC: 0 % (ref 0.0–0.2)

## 2020-07-08 LAB — COMPREHENSIVE METABOLIC PANEL
ALT: 23 U/L (ref 0–44)
AST: 24 U/L (ref 15–41)
Albumin: 3.9 g/dL (ref 3.5–5.0)
Alkaline Phosphatase: 78 U/L (ref 38–126)
Anion gap: 11 (ref 5–15)
BUN: 34 mg/dL — ABNORMAL HIGH (ref 8–23)
CO2: 24 mmol/L (ref 22–32)
Calcium: 8.9 mg/dL (ref 8.9–10.3)
Chloride: 103 mmol/L (ref 98–111)
Creatinine, Ser: 1.94 mg/dL — ABNORMAL HIGH (ref 0.61–1.24)
GFR, Estimated: 34 mL/min — ABNORMAL LOW (ref >60)
Glucose, Bld: 125 mg/dL — ABNORMAL HIGH (ref 70–99)
Potassium: 4.3 mmol/L (ref 3.5–5.1)
Sodium: 138 mmol/L (ref 135–145)
Total Bilirubin: 1 mg/dL (ref 0.3–1.2)
Total Protein: 6.4 g/dL — ABNORMAL LOW (ref 6.5–8.1)

## 2020-07-08 LAB — URINALYSIS, ROUTINE W REFLEX MICROSCOPIC
Bilirubin Urine: NEGATIVE
Glucose, UA: NEGATIVE mg/dL
Hgb urine dipstick: NEGATIVE
Ketones, ur: NEGATIVE mg/dL
Leukocytes,Ua: NEGATIVE
Nitrite: NEGATIVE
Protein, ur: NEGATIVE mg/dL
Specific Gravity, Urine: 1.012 (ref 1.005–1.030)
pH: 6 (ref 5.0–8.0)

## 2020-07-08 LAB — BLOOD GAS, ARTERIAL
Acid-Base Excess: 2.7 mmol/L — ABNORMAL HIGH (ref 0.0–2.0)
Bicarbonate: 26.7 mmol/L (ref 20.0–28.0)
Drawn by: 58793
FIO2: 21
O2 Saturation: 97.6 %
Patient temperature: 37
pCO2 arterial: 40.4 mmHg (ref 32.0–48.0)
pH, Arterial: 7.434 (ref 7.350–7.450)
pO2, Arterial: 96.8 mmHg (ref 83.0–108.0)

## 2020-07-08 LAB — PROTIME-INR
INR: 1 (ref 0.8–1.2)
Prothrombin Time: 13 seconds (ref 11.4–15.2)

## 2020-07-08 LAB — GLUCOSE, CAPILLARY: Glucose-Capillary: 134 mg/dL — ABNORMAL HIGH (ref 70–99)

## 2020-07-08 LAB — BRAIN NATRIURETIC PEPTIDE: B Natriuretic Peptide: 673.4 pg/mL — ABNORMAL HIGH (ref 0.0–100.0)

## 2020-07-08 NOTE — Progress Notes (Signed)
PERIOPERATIVE PRESCRIPTION FOR IMPLANTED CARDIAC DEVICE PROGRAMMING   Patient Information: Name: Bryan Carney, Bryan Carney  DOB: 1940-10-31  MRN: [id number] Planned Procedure: Transcatheter aortic valve replacement, transfemoral approach  Surgeon: Dr. Lauree Chandler  Date of Procedure: 07/12/20 @ 0900  Cautery will be used.  Position during surgery: Supine   Please send documentation back to:  [facility] (Fax # [phone])   Odelia Gage, RN  07/08/2020 1:49 PM       Device Information:   Clinic EP Physician:   Allegra Lai, MD Device Type:  Pacemaker Manufacturer and Phone #:  Medtronic: [phone] Pacemaker Dependent?:  No Date of Last Device Check:  05/02/2020        Normal Device Function?:  Yes     Electrophysiologist's Recommendations:    Have Medtronic Representative present for procedure.  Per Device Clinic Standing Orders, Drake Leach  07/08/2020 2:19 PM

## 2020-07-08 NOTE — Progress Notes (Signed)
Your procedure is scheduled on Tuesday, July 12, 2020.  Report to [facility] Main Entrance "A" at 7:00 A.M., and check in at the Admitting office.  Call this number if you have problems the morning of surgery:  [phone]  Call [phone] if you have any questions prior to your surgery date Monday-Friday 8am-4pm    Remember:  Do not eat or drink after midnight the night before your surgery  Stop taking Eliquis on January 13. You will take your last dose on January 12 (Wednesday).  Continue taking all other medications without change through the day before surgery.  On the morning of surgery do not take any medications.  As of today, STOP taking any Aleve, Naproxen, Ibuprofen, Motrin, Advil, Goody's, BC's, all herbal medications, fish oil, and all vitamins.   WHAT DO I DO ABOUT MY DIABETES MEDICATION?   Marland Kitchen Do not take your glimepiride (AMARYL) or linagliptin (TRADJENTA) the morning of surgery.   HOW TO MANAGE YOUR DIABETES BEFORE AND AFTER SURGERY  Why is it important to control my blood sugar before and after surgery? . Improving blood sugar levels before and after surgery helps healing and can limit problems. . A way of improving blood sugar control is eating a healthy diet by: o  Eating less sugar and carbohydrates o  Increasing activity/exercise o  Talking with your doctor about reaching your blood sugar goals . High blood sugars (greater than 180 mg/dL) can raise your risk of infections and slow your recovery, so you will need to focus on controlling your diabetes during the weeks before surgery. . Make sure that the doctor who takes care of your diabetes knows about your planned surgery including the date and location.  How do I manage my blood sugar before surgery? . Check your blood sugar at least 4 times a day, starting 2 days before surgery, to make sure that the level is not too high or low. . Check your blood sugar the morning of your surgery when you wake up  and every 2 hours until you get to the Short Stay unit. o If your blood sugar is less than 70 mg/dL, you will need to treat for low blood sugar: - Do not take insulin. - Treat a low blood sugar (less than 70 mg/dL) with  cup of clear juice (cranberry or apple), 4 glucose tablets, OR glucose gel. - Recheck blood sugar in 15 minutes after treatment (to make sure it is greater than 70 mg/dL). If your blood sugar is not greater than 70 mg/dL on recheck, call [phone] for further instructions. . Report your blood sugar to the short stay nurse when you get to Short Stay.  . If you are admitted to the hospital after surgery: o Your blood sugar will be checked by the staff and you will probably be given insulin after surgery (instead of oral diabetes medicines) to make sure you have good blood sugar levels. o The goal for blood sugar control after surgery is 80-180 mg/dL.                      Do not wear jewelry.            Do not wear lotions, powders, colognes, or deodorant.            Do not shave 48 hours prior to surgery.  Men may shave face and neck.            Do not bring valuables  to the hospital.            [facility] is not responsible for any belongings or valuables.  Do NOT Smoke (Tobacco/Vaping) or drink Alcohol 24 hours prior to your procedure If you use a CPAP at night, you may bring all equipment for your overnight stay.   Contacts, glasses, dentures or bridgework may not be worn into surgery.      For patients admitted to the hospital, discharge time will be determined by your treatment team.   Patients discharged the day of surgery will not be allowed to drive home, and someone needs to stay with them for 24 hours.    Special instructions:   [place]- Preparing For Surgery  Before surgery, you can play an important role. Because skin is not sterile, your skin needs to be as free of germs as possible. You can reduce the number of germs on your skin by washing with  CHG (chlorahexidine gluconate) Soap before surgery.  CHG is an antiseptic cleaner which kills germs and bonds with the skin to continue killing germs even after washing.    Oral Hygiene is also important to reduce your risk of infection.  Remember - BRUSH YOUR TEETH THE MORNING OF SURGERY WITH YOUR REGULAR TOOTHPASTE  Please do not use if you have an allergy to CHG or antibacterial soaps. If your skin becomes reddened/irritated stop using the CHG.  Do not shave (including legs and underarms) for at least 48 hours prior to first CHG shower. It is OK to shave your face.  Please follow these instructions carefully.   1. Shower the NIGHT BEFORE SURGERY and the MORNING OF SURGERY with CHG Soap.   2. If you chose to wash your hair, wash your hair first as usual with your normal shampoo.  3. After you shampoo, rinse your hair and body thoroughly to remove the shampoo.  4. Use CHG as you would any other liquid soap. You can apply CHG directly to the skin and wash gently with a scrungie or a clean washcloth.   5. Apply the CHG Soap to your body ONLY FROM THE NECK DOWN.  Do not use on open wounds or open sores. Avoid contact with your eyes, ears, mouth and genitals (private parts). Wash Face and genitals (private parts)  with your normal soap.   6. Wash thoroughly, paying special attention to the area where your surgery will be performed.  7. Thoroughly rinse your body with warm water from the neck down.  8. DO NOT shower/wash with your normal soap after using and rinsing off the CHG Soap.  9. Pat yourself dry with a CLEAN TOWEL.  10. Wear CLEAN PAJAMAS to bed the night before surgery  11. Place CLEAN SHEETS on your bed the night of your first shower and DO NOT SLEEP WITH PETS.   Day of Surgery: Wear Clean/Comfortable clothing the morning of surgery Do not apply any deodorants/lotions.   Remember to brush your teeth WITH YOUR REGULAR TOOTHPASTE.   Please read over the following fact sheets  that you were given.

## 2020-07-08 NOTE — Progress Notes (Signed)
PCP - Laverna Peace, NP Cardiologist - Dr. Allegra Lai  PPM/ICD - MedtronicPacemaker Device Orders - Orders requested Rep Notified - Gae Dry Notified  Chest x-ray - 07/08/20 EKG - 06/23/20 Stress Test - 06/12/12 ECHO - 05/27/20 Cardiac Cath - 05/27/20  Sleep Study - Denies  Fasting Blood Sugar - 70's Checks Blood Sugar __1___ time a day  Blood Thinner Instructions: LD: Eliquis 07/06/20 Aspirin Instructions: N/A  ERAS Protcol - No  COVID TEST- Positive result 06/27/20   Anesthesia review: Yes, cardiac hx  Patient denies shortness of breath, fever, cough and chest pain at PAT appointment   All instructions explained to the patient, with a verbal understanding of the material. Patient agrees to go over the instructions while at home for a better understanding. Patient also instructed to self quarantine after being tested for COVID-19. The opportunity to ask questions was provided.

## 2020-07-09 LAB — TYPE AND SCREEN
ABO/RH(D): O NEG
Antibody Screen: NEGATIVE

## 2020-07-11 MED ORDER — VANCOMYCIN HCL 1250 MG/250ML IV SOLN
1250.0000 mg | INTRAVENOUS | Status: AC
  Administered 2020-07-12: 1250 mg via INTRAVENOUS
  Filled 2020-07-11 (×2): qty 250

## 2020-07-11 MED ORDER — SODIUM CHLORIDE 0.9 % IV SOLN
INTRAVENOUS | Status: DC
  Filled 2020-07-11 (×2): qty 30

## 2020-07-11 MED ORDER — DEXMEDETOMIDINE HCL IN NACL 400 MCG/100ML IV SOLN
0.1000 ug/kg/h | INTRAVENOUS | Status: AC
  Administered 2020-07-12: 1 ug/kg/h via INTRAVENOUS
  Administered 2020-07-12: 80.3 ug via INTRAVENOUS
  Filled 2020-07-11 (×2): qty 100

## 2020-07-11 MED ORDER — MAGNESIUM SULFATE 50 % IJ SOLN
40.0000 meq | INTRAMUSCULAR | Status: DC
  Filled 2020-07-11 (×2): qty 9.85

## 2020-07-11 MED ORDER — POTASSIUM CHLORIDE 2 MEQ/ML IV SOLN
80.0000 meq | INTRAVENOUS | Status: DC
  Filled 2020-07-11 (×2): qty 40

## 2020-07-11 MED ORDER — NOREPINEPHRINE 4 MG/250ML-% IV SOLN
0.0000 ug/min | INTRAVENOUS | Status: AC
  Administered 2020-07-12: 2 ug/min via INTRAVENOUS
  Filled 2020-07-11: qty 250

## 2020-07-11 MED ORDER — SODIUM CHLORIDE 0.9 % IV SOLN
1.5000 g | INTRAVENOUS | Status: AC
  Administered 2020-07-12: 1.5 g via INTRAVENOUS
  Filled 2020-07-11 (×3): qty 1.5

## 2020-07-11 NOTE — Progress Notes (Signed)
Anesthesia Chart Review:  Case: [id number] Date/Time: 07/12/20 0900   Procedures:      TRANSCATHETER AORTIC VALVE REPLACEMENT, TRANSFEMORAL (N/A Chest)     TRANSESOPHAGEAL ECHOCARDIOGRAM (TEE) (N/A )   Anesthesia type: General   Pre-op diagnosis: Severe Aortic Stenosis   Location: [facility] CATH LAB 6 / [place] INVASIVE CV LAB   Providers: Burnell Blanks, MD    CT Surgeon: Darylene Price, MD   DISCUSSION: Patient is an 80 year old male scheduled for the above procedure. TAVR was initially scheduled for 06/28/20, but his 06/27/20 COVID-19 test was positive (asymptomatic, history of booster) and case was postponed until 07/12/20.   History includes never smoker, CAD (MI, s/p CABG 10/19/97: "LIMA to LAD, SVG to ramus, SVG to OM2-OM3, SVG to PDA"), HTN, HLD, afib (2018), tachy-brady syndrome (s/p Medtronic Azure XT DR MRI SureScan dual-chamber pacemaker 02/27/18), murmur (severe AS, moderate MR/TR 05/2020), chronic combined systolic and diastolic CHF, PE ("[id number] in the setting of CABG"), pulmonary veno-occlusive disease, DM2, dyspnea, COPD, fibromyalgia, CKD (CKD stage III; subtotal occlusion of the right renal artery with right renal atrophy 05/2020 CTA), CVA (1996, no deficits), carotid artery diease (s/p left carotid endarterectomy  02/23/95), skin cancer ([facility])  PAT RN notified Medtronic rep of surgery plans.   Last Eliquis reported as 07/06/20.   Anesthesia team team to evaluate on the day of surgery.   VS: BP (!) 124/59   Pulse 64   Temp (!) 36.4 C (Oral)   Resp 18   Ht 5\' 4"  (1.626 m)   Wt 56.7 kg   SpO2 98%   BMI 21.44 kg/m     PROVIDERS: [name], NP is PCP  [name], MD is cardiologist Allegra Lai, MD is EP cardiologist Lauree Chandler, MD is structural heart cardiologist   LABS: Labs reviewed: Acceptable for surgery. Creatinine 1.94, consistent with previous results (Cr 1.86 06/23/20. Cr 1.62-2.15 since 12/2019 in [facility]). A1c 6.1% on 06/23/20.  (all labs ordered are  listed, but only abnormal results are displayed)  Labs Reviewed  GLUCOSE, CAPILLARY - Abnormal; Notable for the following components:      Result Value   Glucose-Capillary 134 (*)    All other components within normal limits  CBC - Abnormal; Notable for the following components:   RBC 3.59 (*)    Hemoglobin 11.6 (*)    HCT 36.6 (*)    MCV 101.9 (*)    All other components within normal limits  COMPREHENSIVE METABOLIC PANEL - Abnormal; Notable for the following components:   Glucose, Bld 125 (*)    BUN 34 (*)    Creatinine, Ser 1.94 (*)    Total Protein 6.4 (*)    GFR, Estimated 34 (*)    All other components within normal limits  BLOOD GAS, ARTERIAL - Abnormal; Notable for the following components:   Acid-Base Excess 2.7 (*)    All other components within normal limits  BRAIN NATRIURETIC PEPTIDE - Abnormal; Notable for the following components:   B Natriuretic Peptide 673.4 (*)    All other components within normal limits  PROTIME-INR  URINALYSIS, ROUTINE W REFLEX MICROSCOPIC  TYPE AND SCREEN     IMAGES: CXR 07/08/20: FINDINGS: Stable configuration of 2 lead left subclavian pacemaker. CABG clips overlie the mediastinum. Intact sternotomy wires. Stable cardiomediastinal silhouette with borderline mild cardiomegaly. No pneumothorax. No pleural effusion. Lungs appear clear, with no acute consolidative airspace disease and no pulmonary edema. IMPRESSION: Borderline mild cardiomegaly without pulmonary edema. No active pulmonary  disease.  CTA Chest/abd/pelvis 06/10/20: IMPRESSION: 1. Vascular findings and measurements pertinent to potential TAVR procedure, as detailed above. 2. Severe thickening calcification of the aortic valve, compatible with reported clinical history of severe aortic stenosis. 3. Aortic atherosclerosis, in addition to left main and 3 vessel coronary artery disease. Status post median sternotomy for CABG including LIMA to the LAD. 4. Subtotal  occlusion of the right renal artery with right renal atrophy and severe delayed nephrogram. Clinical correlation for signs and symptoms of renovascular hypertension is recommended. 5. Cardiomegaly. Evidence of mild interstitial pulmonary edema in the lungs. Findings are concerning for mild congestive heart failure. 6. Colonic diverticulosis without evidence of acute diverticulitis at this time. 7. Additional incidental findings, as above. [See full report]   EKG: 06/23/20: Atrial-paced rhythm with prolonged AV conduction Septal infarct , age undetermined ST & T wave abnormality, consider lateral ischemia No significant change since last tracing Confirmed by Croitoru, Mihai [phone]) on 06/23/2020 4:41:38 PM   CV: Carotid US 06/10/20: Summary:  Right Carotid: Velocities in the right ICA are consistent with a 40-59%         stenosis.  Left Carotid: Velocities in the left ICA are consistent with a 1-39%  stenosis.  Vertebrals: Bilateral vertebral arteries demonstrate antegrade flow.   CT Coronary 06/10/20: IMPRESSION: 1. The FOV on this study did not include the entire LV cavity. TAVR analysis not affected. 2. Annular measurements appropriate for 29 mm Edwards S3 TAVR (573 mm2). 3. Trace annular calcification. 4. Sufficient coronary to annulus distance. 5. Optimal Fluoroscopic Angle for Delivery: LAO 17 CAU 11  TEE 05/27/20: IMPRESSIONS  1. Left ventricular ejection fraction, by estimation, is 30 to 35%. The  left ventricle has moderately decreased function. The left ventricle  demonstrates global hypokinesis. The left ventricular internal cavity size  was moderately dilated. There is mild  concentric left ventricular hypertrophy.  2. Right ventricular systolic function is moderately reduced. The right  ventricular size is moderately enlarged. There is severely elevated  pulmonary artery systolic pressure.  3. Left atrial size was severely dilated. No left  atrial/left atrial  appendage thrombus was detected. The LAA emptying velocity was 20 cm/s.  4. Right atrial size was mild to moderately dilated.  5. There is mitral annulus dilation and leaflet malcoaptation, with  secondary mitral insufficiency. There is systolic reversal of flow only in  the right upper pulmonary vein, with systolic forward flow/diastolic  dominant flow in the left upper pulmonary  vein. Vena contracta 4 mm. Effective regurgitant orifice area 0.15 cm,  regurgitant volume 23 ml, regurgitant fraction 25%.. The mitral valve is  normal in structure. Moderate mitral valve regurgitation.  6. Tricuspid valve regurgitation is moderate.  7. Estimated systolic PA pressure is severely elevated at 70 mm Hg, based  on estimated right atrial pressure of 10 mm Hg.  8. The aortic valve is tricuspid. There is severe calcifcation of the  aortic valve. There is severe thickening of the aortic valve. Aortic valve  regurgitation is trivial. Severe aortic valve stenosis. Aortic valve mean  gradient measures 24.6 mmHg.  Aortic valve Vmax measures 3.53 m/s.  9. There is mild (Grade II) atheroma plaque involving the ascending,  transverse and descending aorta.   RHC/LHC 05/27/20: 1.  Severe native vessel coronary artery disease as detailed with multivessel disease involving an occluded right coronary artery, and occluded mid LAD, and multiple occluded obtuse marginal branches of the circumflex 2.  Status post aortocoronary bypass surgery with continued patency of  the LIMA to LAD graft, saphenous vein graft to OM1, sequential saphenous vein graft to OM 2 and OM 3, and saphenous vein graft to PDA 3.  Moderately severe calcific aortic stenosis, possibly low-flow low gradient aortic stenosis with mean transvalvular gradient 25 mmHg - Continue multidisciplinary evaluation for consideration of TAVR.  Patient also underwent transesophageal echo today which suggested moderate mitral regurgitation  and severe aortic stenosis.  He should undergo CTA studies followed by cardiac surgical consultation as part of a multidisciplinary approach to his care.   Past Medical History:  Diagnosis Date  . Anemia   . Anginal pain ([place])   . Arthritis   . CAD (coronary artery disease)   . CHF (congestive heart failure) ([place])   . Chronic kidney disease    stage 3 per daughter  . COPD (chronic obstructive pulmonary disease) ([place])   . Diabetes ([place])   . Dyspnea   . Dysrhythmia   . Fibromyalgia    25 years ago.  Marland Kitchen Heart murmur   . Hx pulmonary embolism   . Hyperlipidemia   . Hypertension   . Myocardial infarction ([place])   . Pneumonia   . Presence of permanent cardiac pacemaker   . Prostate cancer ([place])   . PVOD (pulmonary veno-occlusive disease) ([place])   . S/P CABG x 5 10/19/1997   LIMA to LAD, SVG to ramus, SVG to OM2-OM3, SVG to PDA  . Squamous cell carcinoma   . Stroke  [facility])    22 years ago no deficits noted.    Past Surgical History:  Procedure Laterality Date  . BACK SURGERY    . CARDIAC CATHETERIZATION  10/15/1997   Recommend complete revascularization by CABG  . CARDIOVASCULAR STRESS TEST  06/12/2012   No evidence of ischemia  . CAROTID DOPPLER  08/27/2012   Rt bulb/proximal ICA demonstrated a mild-moderate amount of fibrous plaque without evidence of a significant diameter reduction or any other vascular abnormality.  . CAROTID ENDARTERECTOMY Left 02/23/1995  . CORONARY ANGIOPLASTY    . CORONARY ARTERY BYPASS GRAFT  10/19/1997   x5. LIMA-LAD, SVG-PDA, SVG-OM1, seq SVG-fist and second branches of ramus intermedius.  Marland Kitchen HERNIA REPAIR     groin area.  Randolm Idol / REPLACE / REMOVE PACEMAKER    . NO PAST SURGERIES    . PACEMAKER IMPLANT N/A 02/27/2018   Procedure: PACEMAKER IMPLANT;  Surgeon: Constance Haw, MD;  Location: [place] CV LAB;  Service: Cardiovascular;  Laterality: N/A;  . RIGHT/LEFT HEART CATH AND CORONARY/GRAFT ANGIOGRAPHY N/A 05/27/2020   Procedure: RIGHT/LEFT  HEART CATH AND CORONARY/GRAFT ANGIOGRAPHY;  Surgeon: Sherren Mocha, MD;  Location: [place] CV LAB;  Service: Cardiovascular;  Laterality: N/A;  . TEE WITHOUT CARDIOVERSION N/A 05/27/2020   Procedure: TRANSESOPHAGEAL ECHOCARDIOGRAM (TEE);  Surgeon: Sanda Klein, MD;  Location: [facility] ENDOSCOPY;  Service: Cardiovascular;  Laterality: N/A;    MEDICATIONS: . amiodarone (PACERONE) 200 MG tablet  . apixaban (ELIQUIS) 2.5 MG TABS tablet  . aspirin EC 81 MG tablet  . cetirizine (ZYRTEC) 10 MG tablet  . ELDERBERRY PO  . ferrous sulfate 325 (65 FE) MG tablet  . furosemide (LASIX) 20 MG tablet  . glimepiride (AMARYL) 4 MG tablet  . linagliptin (TRADJENTA) 5 MG TABS tablet  . losartan (COZAAR) 25 MG tablet  . pantoprazole (PROTONIX) 40 MG tablet  . rosuvastatin (CRESTOR) 10 MG tablet   No current facility-administered medications for this encounter.    Myra Gianotti, PA-C Surgical Short Stay/Anesthesiology [facility] Phone [phone] [facility] Phone [phone]  [phone] 07/11/2020 10:40 AM

## 2020-07-11 NOTE — Anesthesia Preprocedure Evaluation (Addendum)
Anesthesia Evaluation  Patient identified by MRN, date of birth, ID band Patient awake    Reviewed: Allergy & Precautions, NPO status , Patient's Chart, lab work & pertinent test results  History of Anesthesia Complications Negative for: history of anesthetic complications  Airway Mallampati: II  TM Distance: >3 FB Neck ROM: Full    Dental  (+) Dental Advisory Given, Lower Dentures, Upper Dentures   Pulmonary COPD,    Pulmonary exam normal breath sounds clear to auscultation       Cardiovascular hypertension, Pt. on medications + angina + CAD, + Past MI, + Cardiac Stents, + CABG and +CHF  + dysrhythmias Atrial Fibrillation + pacemaker + Valvular Problems/Murmurs AS  Rhythm:Irregular Rate:Abnormal     Neuro/Psych CVA, No Residual Symptoms    GI/Hepatic GERD  Medicated,  Endo/Other  diabetes  Renal/GU Renal InsufficiencyRenal disease     Musculoskeletal  (+) Arthritis , Fibromyalgia -  Abdominal   Peds  Hematology   Anesthesia Other Findings   Reproductive/Obstetrics                            Anesthesia Physical Anesthesia Plan  ASA: IV  Anesthesia Plan: MAC   Post-op Pain Management:    Induction: Intravenous  PONV Risk Score and Plan: 1 and Propofol infusion and Treatment may vary due to age or medical condition  Airway Management Planned: Nasal Cannula and Natural Airway  Additional Equipment: Arterial line  Intra-op Plan:   Post-operative Plan:   Informed Consent: I have reviewed the patients History and Physical, chart, labs and discussed the procedure including the risks, benefits and alternatives for the proposed anesthesia with the patient or authorized representative who has indicated his/her understanding and acceptance.     Dental advisory given  Plan Discussed with: CRNA and Anesthesiologist  Anesthesia Plan Comments: (PAT note written 07/11/2020 by Myra Gianotti, PA-C. )       Anesthesia Quick Evaluation

## 2020-07-12 ENCOUNTER — Inpatient Hospital Stay (HOSPITAL_COMMUNITY): Payer: Medicare HMO | Admitting: Physician Assistant

## 2020-07-12 ENCOUNTER — Inpatient Hospital Stay (HOSPITAL_COMMUNITY): Payer: Medicare HMO

## 2020-07-12 ENCOUNTER — Encounter (HOSPITAL_COMMUNITY)
Admission: RE | Disposition: A | Discharge status: Home / Self Care | Payer: Self-pay | Source: Home / Self Care | Attending: Thoracic Surgery (Cardiothoracic Vascular Surgery)

## 2020-07-12 ENCOUNTER — Other Ambulatory Visit: Payer: Self-pay

## 2020-07-12 ENCOUNTER — Other Ambulatory Visit: Payer: Self-pay | Admitting: Physician Assistant

## 2020-07-12 ENCOUNTER — Inpatient Hospital Stay (HOSPITAL_COMMUNITY)
Admission: RE | Admit: 2020-07-12 | Discharge: 2020-07-13 | DRG: 267 | Disposition: A | Discharge status: Home / Self Care | Payer: Medicare HMO | Attending: Thoracic Surgery (Cardiothoracic Vascular Surgery) | Admitting: Thoracic Surgery (Cardiothoracic Vascular Surgery)

## 2020-07-12 ENCOUNTER — Encounter (HOSPITAL_COMMUNITY): Payer: Self-pay | Admitting: Cardiovascular Disease

## 2020-07-12 DIAGNOSIS — I2510 Atherosclerotic heart disease of native coronary artery without angina pectoris: Secondary | ICD-10-CM | POA: Diagnosis present

## 2020-07-12 DIAGNOSIS — Z006 Encounter for examination for normal comparison and control in clinical research program: Secondary | ICD-10-CM | POA: Diagnosis not present

## 2020-07-12 DIAGNOSIS — Z8546 Personal history of malignant neoplasm of prostate: Secondary | ICD-10-CM

## 2020-07-12 DIAGNOSIS — I480 Paroxysmal atrial fibrillation: Secondary | ICD-10-CM | POA: Diagnosis not present

## 2020-07-12 DIAGNOSIS — I5042 Chronic combined systolic (congestive) and diastolic (congestive) heart failure: Secondary | ICD-10-CM | POA: Diagnosis present

## 2020-07-12 DIAGNOSIS — I252 Old myocardial infarction: Secondary | ICD-10-CM

## 2020-07-12 DIAGNOSIS — Z7901 Long term (current) use of anticoagulants: Secondary | ICD-10-CM

## 2020-07-12 DIAGNOSIS — I110 Hypertensive heart disease with heart failure: Secondary | ICD-10-CM | POA: Diagnosis not present

## 2020-07-12 DIAGNOSIS — Z952 Presence of prosthetic heart valve: Secondary | ICD-10-CM

## 2020-07-12 DIAGNOSIS — I10 Essential (primary) hypertension: Secondary | ICD-10-CM | POA: Diagnosis present

## 2020-07-12 DIAGNOSIS — I272 Pulmonary hypertension, unspecified: Secondary | ICD-10-CM | POA: Diagnosis present

## 2020-07-12 DIAGNOSIS — Z8673 Personal history of transient ischemic attack (TIA), and cerebral infarction without residual deficits: Secondary | ICD-10-CM | POA: Diagnosis not present

## 2020-07-12 DIAGNOSIS — I130 Hypertensive heart and chronic kidney disease with heart failure and stage 1 through stage 4 chronic kidney disease, or unspecified chronic kidney disease: Secondary | ICD-10-CM | POA: Diagnosis present

## 2020-07-12 DIAGNOSIS — M797 Fibromyalgia: Secondary | ICD-10-CM | POA: Diagnosis present

## 2020-07-12 DIAGNOSIS — C61 Malignant neoplasm of prostate: Secondary | ICD-10-CM | POA: Diagnosis present

## 2020-07-12 DIAGNOSIS — I5020 Unspecified systolic (congestive) heart failure: Secondary | ICD-10-CM | POA: Diagnosis not present

## 2020-07-12 DIAGNOSIS — E1122 Type 2 diabetes mellitus with diabetic chronic kidney disease: Secondary | ICD-10-CM | POA: Diagnosis present

## 2020-07-12 DIAGNOSIS — N183 Chronic kidney disease, stage 3 unspecified: Secondary | ICD-10-CM | POA: Diagnosis present

## 2020-07-12 DIAGNOSIS — J449 Chronic obstructive pulmonary disease, unspecified: Secondary | ICD-10-CM | POA: Diagnosis present

## 2020-07-12 DIAGNOSIS — I495 Sick sinus syndrome: Secondary | ICD-10-CM | POA: Diagnosis present

## 2020-07-12 DIAGNOSIS — Z950 Presence of cardiac pacemaker: Secondary | ICD-10-CM | POA: Diagnosis present

## 2020-07-12 DIAGNOSIS — K219 Gastro-esophageal reflux disease without esophagitis: Secondary | ICD-10-CM | POA: Diagnosis present

## 2020-07-12 DIAGNOSIS — I350 Nonrheumatic aortic (valve) stenosis: Secondary | ICD-10-CM

## 2020-07-12 DIAGNOSIS — I351 Nonrheumatic aortic (valve) insufficiency: Secondary | ICD-10-CM | POA: Diagnosis not present

## 2020-07-12 DIAGNOSIS — Z9861 Coronary angioplasty status: Secondary | ICD-10-CM

## 2020-07-12 DIAGNOSIS — E119 Type 2 diabetes mellitus without complications: Secondary | ICD-10-CM

## 2020-07-12 DIAGNOSIS — Z951 Presence of aortocoronary bypass graft: Secondary | ICD-10-CM

## 2020-07-12 DIAGNOSIS — E785 Hyperlipidemia, unspecified: Secondary | ICD-10-CM | POA: Diagnosis present

## 2020-07-12 DIAGNOSIS — Z8616 Personal history of COVID-19: Secondary | ICD-10-CM | POA: Diagnosis not present

## 2020-07-12 DIAGNOSIS — Z86711 Personal history of pulmonary embolism: Secondary | ICD-10-CM

## 2020-07-12 DIAGNOSIS — I779 Disorder of arteries and arterioles, unspecified: Secondary | ICD-10-CM | POA: Diagnosis present

## 2020-07-12 DIAGNOSIS — Z8249 Family history of ischemic heart disease and other diseases of the circulatory system: Secondary | ICD-10-CM

## 2020-07-12 HISTORY — PX: TEE WITHOUT CARDIOVERSION: SHX5443

## 2020-07-12 HISTORY — DX: Personal history of transient ischemic attack (TIA), and cerebral infarction without residual deficits: Z86.73

## 2020-07-12 HISTORY — DX: Presence of prosthetic heart valve: Z95.2

## 2020-07-12 HISTORY — PX: TRANSCATHETER AORTIC VALVE REPLACEMENT, TRANSFEMORAL: SHX6400

## 2020-07-12 LAB — POCT I-STAT, CHEM 8
BUN: 37 mg/dL — ABNORMAL HIGH (ref 8–23)
BUN: 34 mg/dL — ABNORMAL HIGH (ref 8–23)
Calcium, Ion: 1.21 mmol/L (ref 1.15–1.40)
Calcium, Ion: 1.21 mmol/L (ref 1.15–1.40)
Chloride: 106 mmol/L (ref 98–111)
Chloride: 104 mmol/L (ref 98–111)
Creatinine, Ser: 1.7 mg/dL — ABNORMAL HIGH (ref 0.61–1.24)
Creatinine, Ser: 1.5 mg/dL — ABNORMAL HIGH (ref 0.61–1.24)
Glucose, Bld: 110 mg/dL — ABNORMAL HIGH (ref 70–99)
Glucose, Bld: 124 mg/dL — ABNORMAL HIGH (ref 70–99)
HCT: 32 % — ABNORMAL LOW (ref 39.0–52.0)
HCT: 30 % — ABNORMAL LOW (ref 39.0–52.0)
Hemoglobin: 10.9 g/dL — ABNORMAL LOW (ref 13.0–17.0)
Hemoglobin: 10.2 g/dL — ABNORMAL LOW (ref 13.0–17.0)
Potassium: 4 mmol/L (ref 3.5–5.1)
Potassium: 4.4 mmol/L (ref 3.5–5.1)
Sodium: 140 mmol/L (ref 135–145)
Sodium: 139 mmol/L (ref 135–145)
TCO2: 27 mmol/L (ref 22–32)
TCO2: 25 mmol/L (ref 22–32)

## 2020-07-12 LAB — GLUCOSE, CAPILLARY
Glucose-Capillary: 69 mg/dL — ABNORMAL LOW (ref 70–99)
Glucose-Capillary: 143 mg/dL — ABNORMAL HIGH (ref 70–99)
Glucose-Capillary: 101 mg/dL — ABNORMAL HIGH (ref 70–99)
Glucose-Capillary: 144 mg/dL — ABNORMAL HIGH (ref 70–99)
Glucose-Capillary: 128 mg/dL — ABNORMAL HIGH (ref 70–99)

## 2020-07-12 LAB — ECHOCARDIOGRAM LIMITED
AR max vel: 2.55 cm2
AV Area VTI: 2.64 cm2
AV Area mean vel: 2.44 cm2
AV Mean grad: 2 mmHg
AV Peak grad: 3.4 mmHg
Ao pk vel: 0.92 m/s

## 2020-07-12 LAB — POCT ACTIVATED CLOTTING TIME
Activated Clotting Time: 106 seconds
Activated Clotting Time: 114 seconds
Activated Clotting Time: 258 seconds

## 2020-07-12 SURGERY — IMPLANTATION, AORTIC VALVE, TRANSCATHETER, FEMORAL APPROACH
Anesthesia: Monitor Anesthesia Care | Site: Chest

## 2020-07-12 MED ORDER — CHLORHEXIDINE GLUCONATE 4 % EX LIQD: 60.0000 mL | Freq: Once | CUTANEOUS | Status: DC

## 2020-07-12 MED ORDER — AMIODARONE HCL 200 MG PO TABS
200.0000 mg | ORAL_TABLET | Freq: Every day | ORAL | Status: DC
  Administered 2020-07-12 – 2020-07-13 (×2): 200 mg via ORAL
  Filled 2020-07-12 (×2): qty 1

## 2020-07-12 MED ORDER — ROSUVASTATIN CALCIUM 5 MG PO TABS
10.0000 mg | ORAL_TABLET | Freq: Every evening | ORAL | Status: DC
  Administered 2020-07-12: 10 mg via ORAL
  Filled 2020-07-12: qty 2

## 2020-07-12 MED ORDER — LIDOCAINE HCL (PF) 1 % IJ SOLN
INTRAMUSCULAR | Status: DC | PRN
  Administered 2020-07-12 (×2): 5 mL via INTRADERMAL

## 2020-07-12 MED ORDER — IOHEXOL 350 MG/ML SOLN
INTRAVENOUS | Status: DC | PRN
  Administered 2020-07-12: 28 mL

## 2020-07-12 MED ORDER — TRAMADOL HCL 50 MG PO TABS: 50.0000 mg | ORAL_TABLET | ORAL | Status: DC | PRN

## 2020-07-12 MED ORDER — PANTOPRAZOLE SODIUM 40 MG PO TBEC
40.0000 mg | DELAYED_RELEASE_TABLET | Freq: Every day | ORAL | Status: DC
  Administered 2020-07-12 – 2020-07-13 (×2): 40 mg via ORAL
  Filled 2020-07-12 (×2): qty 1

## 2020-07-12 MED ORDER — ONDANSETRON HCL 4 MG/2ML IJ SOLN
INTRAMUSCULAR | Status: DC | PRN
  Administered 2020-07-12: 4 mg via INTRAVENOUS

## 2020-07-12 MED ORDER — SODIUM CHLORIDE 0.9% FLUSH
3.0000 mL | Freq: Two times a day (BID) | INTRAVENOUS | Status: DC
  Administered 2020-07-12 – 2020-07-13 (×2): 3 mL via INTRAVENOUS

## 2020-07-12 MED ORDER — PROPOFOL 500 MG/50ML IV EMUL
INTRAVENOUS | Status: DC | PRN
  Administered 2020-07-12: 50 ug/kg/min via INTRAVENOUS

## 2020-07-12 MED ORDER — CHLORHEXIDINE GLUCONATE 4 % EX LIQD: 30.0000 mL | CUTANEOUS | Status: DC

## 2020-07-12 MED ORDER — OXYCODONE HCL 5 MG PO TABS: 5.0000 mg | ORAL_TABLET | ORAL | Status: DC | PRN

## 2020-07-12 MED ORDER — IOHEXOL 350 MG/ML SOLN
INTRAVENOUS | Status: AC
  Filled 2020-07-12: qty 1

## 2020-07-12 MED ORDER — SODIUM CHLORIDE 0.9% FLUSH: 3.0000 mL | INTRAVENOUS | Status: DC | PRN

## 2020-07-12 MED ORDER — HEPARIN (PORCINE) IN NACL 1000-0.9 UT/500ML-% IV SOLN
INTRAVENOUS | Status: AC
  Filled 2020-07-12: qty 1500

## 2020-07-12 MED ORDER — INSULIN ASPART 100 UNIT/ML ~~LOC~~ SOLN
0.0000 [IU] | Freq: Three times a day (TID) | SUBCUTANEOUS | Status: DC
  Administered 2020-07-12 (×2): 2 [IU] via SUBCUTANEOUS
  Administered 2020-07-13: 4 [IU] via SUBCUTANEOUS

## 2020-07-12 MED ORDER — CHLORHEXIDINE GLUCONATE 0.12 % MT SOLN
15.0000 mL | Freq: Once | OROMUCOSAL | Status: AC
  Administered 2020-07-12: 15 mL via OROMUCOSAL
  Filled 2020-07-12: qty 15

## 2020-07-12 MED ORDER — LACTATED RINGERS IV SOLN: INTRAVENOUS | Status: DC | PRN

## 2020-07-12 MED ORDER — DEXTROSE 50 % IV SOLN: 25.0000 mL | Freq: Once | INTRAVENOUS | Status: AC

## 2020-07-12 MED ORDER — LIDOCAINE HCL (PF) 1 % IJ SOLN
INTRAMUSCULAR | Status: AC
  Filled 2020-07-12: qty 30

## 2020-07-12 MED ORDER — ACETAMINOPHEN 650 MG RE SUPP: 650.0000 mg | Freq: Four times a day (QID) | RECTAL | Status: DC | PRN

## 2020-07-12 MED ORDER — PHENYLEPHRINE HCL-NACL 20-0.9 MG/250ML-% IV SOLN
0.0000 ug/min | INTRAVENOUS | Status: DC
  Filled 2020-07-12: qty 250

## 2020-07-12 MED ORDER — PROTAMINE SULFATE 10 MG/ML IV SOLN
INTRAVENOUS | Status: DC | PRN
  Administered 2020-07-12: 10 mg via INTRAVENOUS
  Administered 2020-07-12: 80 mg via INTRAVENOUS

## 2020-07-12 MED ORDER — ASPIRIN EC 81 MG PO TBEC
81.0000 mg | DELAYED_RELEASE_TABLET | Freq: Every day | ORAL | Status: DC
  Administered 2020-07-12 – 2020-07-13 (×2): 81 mg via ORAL
  Filled 2020-07-12 (×2): qty 1

## 2020-07-12 MED ORDER — NITROGLYCERIN IN D5W 200-5 MCG/ML-% IV SOLN: 0.0000 ug/min | INTRAVENOUS | Status: DC

## 2020-07-12 MED ORDER — HEPARIN (PORCINE) IN NACL 1000-0.9 UT/500ML-% IV SOLN
INTRAVENOUS | Status: DC | PRN
  Administered 2020-07-12 (×3): 500 mL

## 2020-07-12 MED ORDER — ACETAMINOPHEN 500 MG PO TABS
1000.0000 mg | ORAL_TABLET | Freq: Once | ORAL | Status: AC
  Administered 2020-07-12: 1000 mg via ORAL
  Filled 2020-07-12: qty 2

## 2020-07-12 MED ORDER — VANCOMYCIN HCL IN DEXTROSE 1-5 GM/200ML-% IV SOLN
1000.0000 mg | Freq: Once | INTRAVENOUS | Status: AC
Start: 2020-07-12 — End: 2020-07-13
  Administered 2020-07-12: 1000 mg via INTRAVENOUS
  Filled 2020-07-12: qty 200

## 2020-07-12 MED ORDER — MORPHINE SULFATE (PF) 2 MG/ML IV SOLN: 1.0000 mg | INTRAVENOUS | Status: DC | PRN

## 2020-07-12 MED ORDER — AMLODIPINE BESYLATE 5 MG PO TABS
5.0000 mg | ORAL_TABLET | Freq: Once | ORAL | Status: AC
  Administered 2020-07-12: 5 mg via ORAL
  Filled 2020-07-12: qty 1

## 2020-07-12 MED ORDER — DEXTROSE 50 % IV SOLN
INTRAVENOUS | Status: AC
  Administered 2020-07-12: 25 mL via INTRAVENOUS
  Filled 2020-07-12: qty 50

## 2020-07-12 MED ORDER — FERROUS SULFATE 325 (65 FE) MG PO TABS
325.0000 mg | ORAL_TABLET | Freq: Every day | ORAL | Status: DC
  Administered 2020-07-13: 325 mg via ORAL
  Filled 2020-07-12: qty 1

## 2020-07-12 MED ORDER — ACETAMINOPHEN 325 MG PO TABS: 650.0000 mg | ORAL_TABLET | Freq: Four times a day (QID) | ORAL | Status: DC | PRN

## 2020-07-12 MED ORDER — SODIUM CHLORIDE 0.9 % IV SOLN
1.5000 g | Freq: Two times a day (BID) | INTRAVENOUS | Status: DC
  Administered 2020-07-12 – 2020-07-13 (×2): 1.5 g via INTRAVENOUS
  Filled 2020-07-12 (×3): qty 1.5

## 2020-07-12 MED ORDER — SODIUM CHLORIDE 0.9 % IV SOLN: INTRAVENOUS | Status: DC

## 2020-07-12 MED ORDER — SODIUM CHLORIDE 0.9 % IV SOLN: 250.0000 mL | INTRAVENOUS | Status: DC | PRN

## 2020-07-12 MED ORDER — SODIUM CHLORIDE 0.9 % IV SOLN: INTRAVENOUS | Status: AC

## 2020-07-12 MED ORDER — HEPARIN SODIUM (PORCINE) 1000 UNIT/ML IJ SOLN
INTRAMUSCULAR | Status: DC | PRN
  Administered 2020-07-12: 9000 [IU] via INTRAVENOUS

## 2020-07-12 MED ORDER — ONDANSETRON HCL 4 MG/2ML IJ SOLN: 4.0000 mg | Freq: Four times a day (QID) | INTRAMUSCULAR | Status: DC | PRN

## 2020-07-12 SURGICAL SUPPLY — 32 items
BAG SNAP BAND KOVER 36X36 (MISCELLANEOUS) ×10 IMPLANT
BLANKET WARM UNDERBOD FULL ACC (MISCELLANEOUS) ×4 IMPLANT
CABLE ADAPT PACING TEMP 12FT (ADAPTER) ×2 IMPLANT
CATH 29 EDWARDS DELIVERY SYS (CATHETERS) ×2 IMPLANT
CATH DIAG 6FR PIGTAIL ANGLED (CATHETERS) ×4 IMPLANT
CATH INFINITI 6F AL2 (CATHETERS) ×2 IMPLANT
CATH S G BIP PACING (CATHETERS) ×2 IMPLANT
CLOSURE MYNX CONTROL 6F/7F (Vascular Products) ×2 IMPLANT
CLOSURE PERCLOSE PROSTYLE (VASCULAR PRODUCTS) ×4 IMPLANT
COVER DOME SNAP 22 D (MISCELLANEOUS) ×2 IMPLANT
CRIMPER (MISCELLANEOUS) ×2 IMPLANT
DEVICE INFLATION ATRION QL38 (MISCELLANEOUS) ×2 IMPLANT
GUIDEWIRE SAFE TJ AMPLATZ EXST (WIRE) ×2 IMPLANT
KIT HEART LEFT (KITS) ×4 IMPLANT
KIT MICROINTRODUCER 5F 7206 (SHEATH) ×2 IMPLANT
KIT MICROPUNCTURE NIT STIFF (SHEATH) ×2 IMPLANT
PACK CARDIAC CATHETERIZATION (CUSTOM PROCEDURE TRAY) ×4 IMPLANT
SHEATH 16X36 EDWARDS (SHEATH) ×2 IMPLANT
SHEATH BRITE TIP 7FR 35CM (SHEATH) ×2 IMPLANT
SHEATH PINNACLE 6F 10CM (SHEATH) ×2 IMPLANT
SHEATH PINNACLE 8F 10CM (SHEATH) ×2 IMPLANT
SHEATH PROBE COVER 6X72 (BAG) ×2 IMPLANT
SLEEVE REPOSITIONING LENGTH 30 (MISCELLANEOUS) ×2 IMPLANT
STOPCOCK MORSE 400PSI 3WAY (MISCELLANEOUS) ×8 IMPLANT
TRANSDUCER W/STOPCOCK (MISCELLANEOUS) ×8 IMPLANT
TUBING CIL FLEX 10 FLL-RA (TUBING) ×2 IMPLANT
TUBING CONTRAST HIGH PRESS 48 (TUBING) ×2 IMPLANT
VALVE HEART TRANSCATH SZ3 29MM (Valve) ×2 IMPLANT
WIRE AMPLATZ SS-J .035X180CM (WIRE) ×2 IMPLANT
WIRE EMERALD 3MM-J .035X150CM (WIRE) ×2 IMPLANT
WIRE EMERALD 3MM-J .035X260CM (WIRE) ×2 IMPLANT
WIRE EMERALD ST .035X260CM (WIRE) ×2 IMPLANT

## 2020-07-12 NOTE — Anesthesia Postprocedure Evaluation (Signed)
Anesthesia Post Note  Patient: BARNES FLOREK  Procedure(s) Performed: TRANSCATHETER AORTIC VALVE REPLACEMENT, TRANSFEMORAL (N/A Chest) TRANSESOPHAGEAL ECHOCARDIOGRAM (TEE) (N/A )     Patient location during evaluation: ICU Anesthesia Type: MAC Level of consciousness: awake Pain management: pain level controlled Vital Signs Assessment: post-procedure vital signs reviewed and stable Respiratory status: spontaneous breathing Cardiovascular status: stable Postop Assessment: no apparent nausea or vomiting Anesthetic complications: no   No complications documented.  Last Vitals:  Vitals:   07/12/20 1216 07/12/20 1230  BP:  122/63  Pulse:  (!) 59  Resp:  12  Temp: (!) 36.4 C   SpO2:  100%    Last Pain:  Vitals:   07/12/20 1216  TempSrc: Temporal  PainSc:                  Welborn Keena

## 2020-07-12 NOTE — Discharge Instructions (Signed)
ACTIVITY AND EXERCISE °• Daily activity and exercise are an important part of your recovery. People recover at different rates depending on their general health and type of valve procedure. °• Most people recovering from TAVR feel better relatively quickly  °• No lifting, pushing, pulling more than 10 pounds (examples to avoid: groceries, vacuuming, gardening, golfing): °            - For one week with a procedure through the groin. °            - For six weeks for procedures through the chest wall or neck. °NOTE: You will typically see one of our providers 7-14 days after your procedure to discuss WHEN TO RESUME the above activities.  °  °  °DRIVING °• Do not drive until you are seen for follow up and cleared by a provider. Generally, we ask patient to not drive for 1 week after their procedure. °• If you have been told by your doctor in the past that you may not drive, you must talk with him/her before you begin driving again. °  °DRESSING °• Groin site: you may leave the clear dressing over the site for up to one week or until it falls off. °  °HYGIENE °• If you had a femoral (leg) procedure, you may take a shower when you return home. After the shower, pat the site dry. Do NOT use powder, oils or lotions in your groin area until the site has completely healed. °• If you had a chest procedure, you may shower when you return home unless specifically instructed not to by your discharging practitioner. °            - DO NOT scrub incision; pat dry with a towel. °            - DO NOT apply any lotions, oils, powders to the incision. °            - No tub baths / swimming for at least 2 weeks. °• If you notice any fevers, chills, increased pain, swelling, bleeding or pus, please contact your doctor. °  °ADDITIONAL INFORMATION °• If you are going to have an upcoming dental procedure, please contact our office as you will require antibiotics ahead of time to prevent infection on your heart valve.  ° ° °If you have any  questions or concerns you can call the structural heart phone during normal business hours 8am-4pm. If you have an urgent need after hours or weekends please call [phone] to talk to the on call provider for general cardiology. If you have an emergency that requires immediate attention, please call 911.  ° ° °After TAVR Checklist ° °Check  Test Description  ° Follow up appointment in 1-2 weeks  You will see our structural heart physician assistant, Bryan Carney. Your incision sites will be checked and you will be cleared to drive and resume all normal activities if you are doing well.    ° 1 month echo and follow up  You will have an echo to check on your new heart valve and be seen back in the office by Bryan Carney. Many times the echo is not read by your appointment time, but Bryan will call you later that day or the following day to report your results.  ° Follow up with your primary cardiologist You will need to be seen by your primary cardiologist in the following 3-6 months after your 1 month appointment in the valve   clinic. Often times your Plavix or Aspirin will be discontinued during this time, but this is decided on a case by case basis.   ° 1 year echo and follow up You will have another echo to check on your heart valve after 1 year and be seen back in the office by Bryan Carney. This your last structural heart visit.  ° Bacterial endocarditis prophylaxis  You will have to take antibiotics for the rest of your life before all dental procedures (even teeth cleanings) to protect your heart valve. Antibiotics are also required before some surgeries. Please check with your cardiologist before scheduling any surgeries. Also, please make sure to tell us if you have a penicillin allergy as you will require an alternative antibiotic.   ° ° °

## 2020-07-12 NOTE — CV Procedure (Signed)
[facility]  TAVR OPERATIVE NOTE   Date of Procedure:  07/12/2020  Preoperative Diagnosis: Severe Aortic Stenosis   Postoperative Diagnosis: Same   Procedure:    Transcatheter Aortic Valve Replacement - Transfemoral Approach  Edwards Sapien 3 THV (size 29 mm, model # [id number], serial # [id number])   Co-Surgeons:  Lauree Chandler, MD and Valentina Gu. Roxy Manns, MD   Anesthesiologist:  Nyoka Cowden  Echocardiographer:  Johnsie Cancel  Pre-operative Echo Findings:  Severe aortic stenosis  Moderatel left ventricular systolic dysfunction  Post-operative Echo Findings:  No paravalvular leak  Moderate left ventricular systolic function  BRIEF CLINICAL NOTE AND INDICATIONS FOR SURGERY  80 y.o. male here today for TAVR. He was recently hospitalized 11/18-11/20/21 with acute on chronic systolic heart failure. He reports a longstanding heart murmur and he has been followed over time with serial echo studies. He developed slowly progressive shortness of breath over a period of about 5 weeks.  He became extremely short of breath at rest and required hospitalization for IV diuresis.  He improved rapidly and was only hospitalized for about 48 hours.  The patient's echocardiogram demonstrated severe aortic stenosis, severe mitral regurgitation, and severe tricuspid regurgitation.  Since hospital discharge he has felt better with improved breathing, no orthopnea, no abdominal swelling, and no leg swelling.  He underwent PPM in 2018. He underwent CABG in 1999. The patient had a stroke in 1996 and underwent left carotid endarterectomy at that time. Cardiac cath with stable CAD post CABG with patent grafts.   During the course of the patient's preoperative work up they have been evaluated comprehensively by a multidisciplinary team of specialists coordinated through the [facility] in the [facility].  They have been demonstrated to suffer from  symptomatic severe aortic stenosis as noted above. The patient has been counseled extensively as to the relative risks and benefits of all options for the treatment of severe aortic stenosis including long term medical therapy, conventional surgery for aortic valve replacement, and transcatheter aortic valve replacement.  The patient has been independently evaluated by Dr. Roxy Manns with CT surgery and they are felt to be at high risk for conventional surgical aortic valve replacement. The surgeon indicated the patient would be a poor candidate for conventional surgery. Based upon review of all of the patient's preoperative diagnostic tests they are felt to be candidate for transcatheter aortic valve replacement using the transfemoral approach as an alternative to high risk conventional surgery.    Following the decision to proceed with transcatheter aortic valve replacement, a discussion has been held regarding what types of management strategies would be attempted intraoperatively in the event of life-threatening complications, including whether or not the patient would be considered a candidate for the use of cardiopulmonary bypass and/or conversion to open sternotomy for attempted surgical intervention.  The patient has been advised of a variety of complications that might develop peculiar to this approach including but not limited to risks of death, stroke, paravalvular leak, aortic dissection or other major vascular complications, aortic annulus rupture, device embolization, cardiac rupture or perforation, acute myocardial infarction, arrhythmia, heart block or bradycardia requiring permanent pacemaker placement, congestive heart failure, respiratory failure, renal failure, pneumonia, infection, other late complications related to structural valve deterioration or migration, or other complications that might ultimately cause a temporary or permanent loss of functional independence or other long term morbidity.   The patient provides full informed consent for the procedure as described and all questions were answered  preoperatively.    DETAILS OF THE OPERATIVE PROCEDURE  PREPARATION:   The patient is brought to the operating room on the above mentioned date and central monitoring was established by the anesthesia team including placement of a radial arterial line. The patient is placed in the supine position on the operating table.  Intravenous antibiotics are administered. Conscious sedation is used.   Baseline transthoracic echocardiogram was performed. The patient's chest, abdomen, both groins, and both lower extremities are prepared and draped in a sterile manner. A time out procedure is performed.   PERIPHERAL ACCESS:   Using the modified Seldinger technique, femoral arterial and venous access were obtained with placement of a 6 Fr sheath in the artery and a 7 Fr sheath in the vein on the left side using u/s guidance.  A pigtail diagnostic catheter was passed through the femoral arterial sheath under fluoroscopic guidance into the aortic root.  A temporary transvenous pacemaker catheter was passed through the femoral venous sheath under fluoroscopic guidance into the right ventricle.  The pacemaker was tested to ensure stable lead placement and pacemaker capture. Aortic root angiography was performed in order to determine the optimal angiographic angle for valve deployment.  TRANSFEMORAL ACCESS:  A micropuncture kit was used to gain access to the right femoral artery using u/s guidance. Position confirmed with angiography. Pre-closure with double ProGlide closure devices. The patient was heparinized systemically and ACT verified > 250 seconds.    A 16 Fr transfemoral E-sheath was introduced into the right femoral artery after progressively dilating over an Amplatz superstiff wire. An AL-2 catheter was used to direct a straight-tip exchange length wire across the native aortic valve into the left  ventricle. This was exchanged out for a pigtail catheter and position was confirmed in the LV apex. Simultaneous LV and Ao pressures were recorded.  The pigtail catheter was then exchanged for an Amplatz Extra-stiff wire in the LV apex.   TRANSCATHETER HEART VALVE DEPLOYMENT:  An Edwards Sapien 3 THV (size 29 mm) was prepared and crimped per manufacturer's guidelines, and the proper orientation of the valve is confirmed on the [employer] delivery system. The valve was advanced through the introducer sheath using normal technique until in an appropriate position in the abdominal aorta beyond the sheath tip. The balloon was then retracted and using the fine-tuning wheel was centered on the valve. The valve was then advanced across the aortic arch using appropriate flexion of the catheter. The valve was carefully positioned across the aortic valve annulus. The Commander catheter was retracted using normal technique. Once final position of the valve has been confirmed by angiographic assessment, the valve is deployed while temporarily holding ventilation and during rapid ventricular pacing to maintain systolic blood pressure < 50 mmHg and pulse pressure < 10 mmHg. The balloon inflation is held for >3 seconds after reaching full deployment volume. Once the balloon has fully deflated the balloon is retracted into the ascending aorta and valve function is assessed using TTE. There is felt to be no paravalvular leak and no central aortic insufficiency.  The patient's hemodynamic recovery following valve deployment is good.  The deployment balloon and guidewire are both removed. Echo demostrated acceptable post-procedural gradients, stable mitral valve function, and no AI. Ventricular fibrillation post deployment requiring one shock for defibrillation.   PROCEDURE COMPLETION:  The sheath was then removed and closure devices were completed. Protamine was administered once femoral arterial repair was complete. The  temporary pacemaker, pigtail catheters and femoral sheaths were removed  with a Mynx closure device placed in the artery and manual pressure used for venous hemostasis.    The patient tolerated the procedure well and is transported to the surgical intensive care in stable condition. There were no immediate intraoperative complications. All sponge instrument and needle counts are verified correct at completion of the operation.   No blood products were administered during the operation.  The patient received a total of 28 mL of intravenous contrast during the procedure.  Lauree Chandler MD 07/12/2020 11:16 AM

## 2020-07-12 NOTE — Progress Notes (Signed)
Rt radial arterial line d/c'ed. Manual pressure held x 10 minutes. Palpable rt radial; level 0; gauze and tegaderm dressing

## 2020-07-12 NOTE — Anesthesia Procedure Notes (Signed)
Procedure Name: MAC Date/Time: 07/12/2020 9:55 AM Performed by: Griffin Dakin, CRNA Pre-anesthesia Checklist: Patient identified, Emergency Drugs available, Suction available, Patient being monitored and Timeout performed Patient Re-evaluated:Patient Re-evaluated prior to induction Oxygen Delivery Method: Simple face mask Induction Type: IV induction Airway Equipment and Method: Oral airway Placement Confirmation: positive ETCO2 and breath sounds checked- equal and bilateral Dental Injury: Teeth and Oropharynx as per pre-operative assessment

## 2020-07-12 NOTE — Progress Notes (Signed)
  [facility] VALVE TEAM  Patient doing well s/p TAVR. He is hemodynamically stable. Groin sites stable. ECG with paced rhythm. Arterial line discontinued and transferred to 4E. Plan for early ambulation after bedrest completed and hopeful discharge over the next 24-48 hours.   Angelena Form PA-C  [facility]  Pager [phone]

## 2020-07-12 NOTE — Interval H&P Note (Signed)
History and Physical Interval Note:  07/12/2020 8:19 AM  [name]  has presented today for surgery, with the diagnosis of Severe Aortic Stenosis.  The various methods of treatment have been discussed with the patient and family. After consideration of risks, benefits and other options for treatment, the patient has consented to  Procedure(s): TRANSCATHETER AORTIC VALVE REPLACEMENT, TRANSFEMORAL (N/A) TRANSESOPHAGEAL ECHOCARDIOGRAM (TEE) (N/A) as a surgical intervention.  The patient's history has been reviewed, patient examined, no change in status, stable for surgery.  I have reviewed the patient's chart and labs.  Questions were answered to the patient's satisfaction.     Bryan Carney

## 2020-07-12 NOTE — Transfer of Care (Signed)
Immediate Anesthesia Transfer of Care Note  Patient: Bryan Carney  Procedure(s) Performed: TRANSCATHETER AORTIC VALVE REPLACEMENT, TRANSFEMORAL (N/A Chest) TRANSESOPHAGEAL ECHOCARDIOGRAM (TEE) (N/A )  Patient Location: Cath Lab  Anesthesia Type:MAC  Level of Consciousness: drowsy and patient cooperative  Airway & Oxygen Therapy: Patient Spontanous Breathing and Patient connected to face mask oxygen  Post-op Assessment: Report given to RN and Post -op Vital signs reviewed and stable  Post vital signs: Reviewed and stable  Last Vitals:  Vitals Value Taken Time  BP 102/49 07/12/20 1144  Temp 36.4 C 07/12/20 1143  Pulse 60 07/12/20 1148  Resp 11 07/12/20 1148  SpO2 95 % 07/12/20 1148  Vitals shown include unvalidated device data.  Last Pain:  Vitals:   07/12/20 1143  TempSrc: Temporal  PainSc: Asleep      Patients Stated Pain Goal: 2 (02/63/78 5885)  Complications: No complications documented.

## 2020-07-12 NOTE — Progress Notes (Signed)
  Echocardiogram 2D Echocardiogram has been performed.  Bryan Carney 07/12/2020, 11:48 AM

## 2020-07-12 NOTE — Progress Notes (Signed)
Mobility Specialist: Progress Note   07/12/20 1823  Mobility  Activity Ambulated in hall  Level of Assistance Modified independent, requires aide device or extra time  Assistive Device Front wheel walker  Distance Ambulated (ft) 354 ft  Mobility Response Tolerated well  Mobility performed by Mobility specialist  $Mobility charge 1 Mobility   Pre-Mobility: 60 HR, 152/61 BP, 98% SpO2 Post-Mobility: 88 HR, 175/61 BP, 98% SpO2  Pt ambulated with no c/o. Pt said he doesn't use a AD at home and would like to walk without it tomorrow. Pt back to bed for RN to check groin sites.   [facility] Zarina Pe Mobility Specialist

## 2020-07-12 NOTE — Op Note (Signed)
[facility]   MULTIDISCIPLINARY HEART VALVE TEAM   TAVR OPERATIVE NOTE   Date of Procedure:  07/12/2020  Preoperative Diagnosis: Severe Aortic Stenosis   Postoperative Diagnosis: Same   Procedure:    Transcatheter Aortic Valve Replacement - Percutaneous Right Transfemoral Approach  Edwards Sapien 3 THV (size 29 mm, model # 9600TFX, serial # [id number])   Co-Surgeons:  Valentina Gu. Roxy Manns, MD and Lauree Chandler, MD  Anesthesiologist:  Hoy Morn, MD and Belinda Block, MD  Echocardiographer:  Jenkins Rouge, MD  Pre-operative Echo Findings:  Severe aortic stenosis  Mild-moderate left ventricular systolic dysfunction  Post-operative Echo Findings:  No paravalvular leak  Unchanged left ventricular systolic function   BRIEF CLINICAL NOTE AND INDICATIONS FOR SURGERY  Patient is an 80 year old male with history of coronary artery disease status post coronary artery bypass grafting in the remote past, hypertension, hyperlipidemia, cerebrovascular disease status post stroke 1996 followed by carotid endarterectomy, pulmonary embolism, paroxysmal atrial fibrillation on long-term anticoagulation using Eliquis, sick sinus syndrome status post permanent pacemaker placement, stage III chronic kidney disease, COPD, and type 2 diabetes mellitus who was recently hospitalized with acute combined systolic and diastolic congestive heart failure and is now been referred for surgical consultation to discuss treatment options for management of severe aortic stenosis with mitral regurgitation and tricuspid regurgitation.  Patient's cardiac history dates back to 1996 when he initially presented with a stroke.  He subsequently underwent left carotid endarterectomy and was left with no residual.  He developed coronary artery disease and underwent coronary artery bypass grafting x5 in 1999 by Dr. [name].  He has been followed carefully ever since by Dr. [name].  In 2019 he was found  to have episodes of paroxysmal atrial fibrillation with sick sinus syndrome and sinus pauses on routine monitor and subsequently underwent permanent pacemaker placement by Dr. [name].  He has remote history of pulmonary embolism and had been on Coumadin anticoagulation for many years but was later converted to Eliquis in 2019 following a fall with a hematoma.  He has been maintaining paced rhythm with relatively low burden of atrial fibrillation on subsequent device monitoring.  Echocardiogram performed September 2020 revealed mildly reduced left ventricular systolic function with ejection fraction estimated 45 to 50%.  There was evidence of moderate aortic stenosis with moderate mitral regurgitation.  The patient states that approximately 2 months ago he began to decline fairly quickly.  He developed rapidly progressive symptoms of exertional shortness of breath and fatigue ultimately resulting in hospitalization in November with resting shortness of breath and pulmonary edema.  He ruled out for acute myocardial infarction based on serial cardiac enzymes.  Echocardiogram performed May 12, 2020 revealed moderate to severe left ventricular systolic dysfunction with ejection fraction estimated only 30 to 35%.  There was severe aortic stenosis with peak velocity across aortic valve measured 3.9 m/s corresponding to mean transvalvular gradient 35 mmHg and aortic valve area calculated only 0.8 cm with DVI reported 0.20.  There was severe mitral regurgitation with moderate mitral annular calcification and severe tricuspid regurgitation.  Symptoms improved rapidly with IV diuresis and he was discharged home within only 2 to 3 days.  He was referred to the multidisciplinary heart valve clinic and has been evaluated previously by Dr. Burt Knack.  TEE and diagnostic cardiac catheterization were performed May 27, 2020.  TEE confirmed the presence of moderately decreased left ventricular systolic function with  ejection fraction estimated only 30 to 35%.  There was moderate right ventricular chamber enlargement with  moderately reduced right ventricular function and severe pulmonary hypertension.  There was severe aortic stenosis with trivial aortic insufficiency.  There was moderate mitral regurgitation and moderate tricuspid regurgitation.  Catheterization revealed severe native coronary artery disease with chronic occlusion of the mid left anterior descending coronary artery, the right coronary artery, and multiple branches of the left circumflex system.  All 5 previous coronary artery bypass graft remained widely patent without any significant disease.  Mean transvalvular gradient across the aortic valve was measured 25 mmHg consistent with low flow low gradient severe aortic stenosis.  Pulmonary artery pressures were moderately elevated.  CT angiography has been performed and the patient was referred for surgical consultation.  During the course of the patient's preoperative work up they have been evaluated comprehensively by a multidisciplinary team of specialists coordinated through the [facility] in the [facility].  They have been demonstrated to suffer from symptomatic severe aortic stenosis as noted above. The patient has been counseled extensively as to the relative risks and benefits of all options for the treatment of severe aortic stenosis including long term medical therapy, conventional surgery for aortic valve replacement, and transcatheter aortic valve replacement.  All questions have been answered, and the patient provides full informed consent for the operation as described.   DETAILS OF THE OPERATIVE PROCEDURE  PREPARATION:    The patient is brought to the operating room on the above mentioned date and appropriate monitoring was established by the anesthesia team. The patient is placed in the supine position on the operating table.   Intravenous antibiotics are administered. The patient is monitored closely throughout the procedure under conscious sedation.  Baseline transthoracic echocardiogram was performed. The patient's chest, abdomen, both groins, and both lower extremities are prepared and draped in a sterile manner. A time out procedure is performed.   PERIPHERAL ACCESS:    Using the modified Seldinger technique, femoral arterial and venous access was obtained with placement of 6 Fr sheaths on the left side.  A pigtail diagnostic catheter was passed through the left arterial sheath under fluoroscopic guidance into the aortic root.  A temporary transvenous pacemaker catheter was passed through the left femoral venous sheath under fluoroscopic guidance into the right ventricle.  The pacemaker was tested to ensure stable lead placement and pacemaker capture. Aortic root angiography was performed in order to determine the optimal angiographic angle for valve deployment.   TRANSFEMORAL ACCESS:   Percutaneous transfemoral access and sheath placement was performed using ultrasound guidance.  The right common femoral artery was cannulated using a micropuncture needle and appropriate location was verified using hand injection angiogram.  A pair of Abbott Perclose percutaneous closure devices were placed and a 6 French sheath replaced into the femoral artery.  The patient was heparinized systemically and ACT verified > 250 seconds.    A 16 Fr transfemoral E-sheath was introduced into the right common femoral artery after progressively dilating over an Amplatz superstiff wire. An AL-2 catheter was used to direct a straight-tip exchange length wire across the native aortic valve into the left ventricle. This was exchanged out for a pigtail catheter and position was confirmed in the LV apex. Simultaneous LV and Ao pressures were recorded.  The pigtail catheter was exchanged for an Amplatz Extra-stiff wire in the LV apex.   Echocardiography was utilized to confirm appropriate wire position and no sign of entanglement in the mitral subvalvular apparatus.   TRANSCATHETER HEART VALVE DEPLOYMENT:   An Oletta Lamas  Sapien 3 transcatheter heart valve (size 29 mm, model #[id number], serial [id number]) was prepared and crimped per manufacturer's guidelines, and the proper orientation of the valve is confirmed on the [employer] delivery system. The valve was advanced through the introducer sheath using normal technique until in an appropriate position in the abdominal aorta beyond the sheath tip. The balloon was then retracted and using the fine-tuning wheel was centered on the valve. The valve was then advanced across the aortic arch using appropriate flexion of the catheter. The valve was carefully positioned across the aortic valve annulus. The Commander catheter was retracted using normal technique. Once final position of the valve has been confirmed by angiographic assessment, the valve is deployed while temporarily holding ventilation and during rapid ventricular pacing to maintain systolic blood pressure < 50 mmHg and pulse pressure < 10 mmHg. The balloon inflation is held for >3 seconds after reaching full deployment volume. Once the balloon has fully deflated the balloon is retracted into the ascending aorta.  At completion of rapid pacing and valve deployment the patient was in ventricular fibrillation.  The patient was immediately cardioverted and returned to stable paced rhythm.  Valve function is assessed using echocardiography. There is felt to be no paravalvular leak and no central aortic insufficiency.  The patient's hemodynamic recovery following valve deployment is good.  The deployment balloon and guidewire are both removed.    PROCEDURE COMPLETION:   The sheath was removed and femoral artery closure performed.  Protamine was administered once femoral arterial repair was complete. The temporary pacemaker, pigtail  catheters and femoral sheaths were removed with manual pressure used for hemostasis.  A Mynx femoral closure device was utilized following removal of the diagnostic sheath in the left femoral artery.  The patient tolerated the procedure well and is transported to the surgical intensive care in stable condition. There were no immediate intraoperative complications. All sponge instrument and needle counts are verified correct at completion of the operation.   No blood products were administered during the operation.  The patient received a total of 30 mL of intravenous contrast during the procedure.   Rexene Alberts, MD 07/12/2020 11:29 AM

## 2020-07-13 ENCOUNTER — Inpatient Hospital Stay (HOSPITAL_COMMUNITY): Payer: Medicare HMO

## 2020-07-13 DIAGNOSIS — I350 Nonrheumatic aortic (valve) stenosis: Secondary | ICD-10-CM | POA: Diagnosis not present

## 2020-07-13 DIAGNOSIS — Z952 Presence of prosthetic heart valve: Secondary | ICD-10-CM

## 2020-07-13 DIAGNOSIS — I5042 Chronic combined systolic (congestive) and diastolic (congestive) heart failure: Secondary | ICD-10-CM | POA: Diagnosis not present

## 2020-07-13 DIAGNOSIS — Z8616 Personal history of COVID-19: Secondary | ICD-10-CM | POA: Diagnosis not present

## 2020-07-13 DIAGNOSIS — Z006 Encounter for examination for normal comparison and control in clinical research program: Secondary | ICD-10-CM | POA: Diagnosis not present

## 2020-07-13 LAB — BASIC METABOLIC PANEL
Anion gap: 9 (ref 5–15)
BUN: 31 mg/dL — ABNORMAL HIGH (ref 8–23)
CO2: 23 mmol/L (ref 22–32)
Calcium: 8.4 mg/dL — ABNORMAL LOW (ref 8.9–10.3)
Chloride: 105 mmol/L (ref 98–111)
Creatinine, Ser: 1.63 mg/dL — ABNORMAL HIGH (ref 0.61–1.24)
GFR, Estimated: 42 mL/min — ABNORMAL LOW (ref >60)
Glucose, Bld: 56 mg/dL — ABNORMAL LOW (ref 70–99)
Potassium: 4 mmol/L (ref 3.5–5.1)
Sodium: 137 mmol/L (ref 135–145)

## 2020-07-13 LAB — ECHOCARDIOGRAM COMPLETE
AR max vel: 1.62 cm2
AV Area VTI: 1.68 cm2
AV Area mean vel: 1.7 cm2
AV Mean grad: 9 mmHg
AV Peak grad: 16.7 mmHg
Ao pk vel: 2.05 m/s
Area-P 1/2: 2.45 cm2
Height: 64 in
MV M vel: 5.37 m/s
MV Peak grad: 115.3 mmHg
Radius: 0.5 cm
S' Lateral: 4.1 cm
Weight: 1829.64 oz

## 2020-07-13 LAB — CBC
HCT: 33.4 % — ABNORMAL LOW (ref 39.0–52.0)
Hemoglobin: 10.8 g/dL — ABNORMAL LOW (ref 13.0–17.0)
MCH: 32.9 pg (ref 26.0–34.0)
MCHC: 32.3 g/dL (ref 30.0–36.0)
MCV: 101.8 fL — ABNORMAL HIGH (ref 80.0–100.0)
Platelets: 139 10*3/uL — ABNORMAL LOW (ref 150–400)
RBC: 3.28 MIL/uL — ABNORMAL LOW (ref 4.22–5.81)
RDW: 14.1 % (ref 11.5–15.5)
WBC: 9.2 10*3/uL (ref 4.0–10.5)
nRBC: 0 % (ref 0.0–0.2)

## 2020-07-13 LAB — GLUCOSE, CAPILLARY
Glucose-Capillary: 62 mg/dL — ABNORMAL LOW (ref 70–99)
Glucose-Capillary: 161 mg/dL — ABNORMAL HIGH (ref 70–99)

## 2020-07-13 LAB — MAGNESIUM: Magnesium: 2 mg/dL (ref 1.7–2.4)

## 2020-07-13 NOTE — Discharge Summary (Addendum)
[facility] VALVE TEAM  Discharge Summary    Patient ID: Bryan Carney MRN: [id number]; DOB: 03-16-1941  Admit date: 07/12/2020 Discharge date: 07/13/2020  Primary Care Provider: [name], NP  Primary Cardiologist: [name], MD / Dr. Angelena Form and Dr. Roxy Manns (TAVR)  Discharge Diagnoses    Principal Problem:   S/P TAVR (transcatheter aortic valve replacement) Active Problems:   Essential hypertension   Hyperlipidemia   Carotid artery disease ([place])   Type 2 diabetes mellitus without complication, without long-term current use of insulin (HCC)   Paroxysmal atrial fibrillation (HCC)   Prostate cancer ([place])   S/P CABG x 5   Presence of permanent cardiac pacemaker   History of CVA (cerebrovascular accident)   Allergies Allergies  Allergen Reactions  . Lopressor [Metoprolol Tartrate] Other (See Comments)    Severe BP drop  . Lipitor [Atorvastatin]     Muscle aches    Diagnostic Studies/Procedures    TAVR OPERATIVE NOTE   Date of Procedure:                07/12/2020  Preoperative Diagnosis:      Severe Aortic Stenosis   Postoperative Diagnosis:    Same   Procedure:        Transcatheter Aortic Valve Replacement - Percutaneous Right Transfemoral Approach             Edwards Sapien 3 THV (size 29 mm, model # 9600TFX, serial # [id number])              Co-Surgeons:                        Valentina Gu. Roxy Manns, MD and Lauree Chandler, MD  Anesthesiologist:                  Hoy Morn, MD and Belinda Block, MD  Echocardiographer:              Jenkins Rouge, MD  Pre-operative Echo Findings: ? Severe aortic stenosis ? Mild-moderate left ventricular systolic dysfunction  Post-operative Echo Findings: ? No paravalvular leak ? Unchanged left ventricular systolic function  _____________    Echo 07/13/2020: complete but pending formal read at the time of discharge    History of Present Illness     Bryan Carney  is a 80 y.o. male with a history of CAD s/p CABG, CVA in setting of carotid artery stenosis s/p CEA, DMT2, PAF on chronic anticoagulation, PE, anemia, CKD stage III, COPD, SSS s/p PPM, HTN, HLD, recent admission for acute on chronic combined systolic HF and moderate MR/TR as well as severe AS who presented to [facility] on 07/12/20 for planned TAVR.   Patient's cardiac history dates back to 1996 when he initially presented with a stroke.  He subsequently underwent left carotid endarterectomy and was left with no residual.  He developed coronary artery disease and underwent coronary artery bypass grafting x5 in 1999 by Dr. [name].  He has been followed carefully ever since by Dr. [name].  In 2019 he was found to have episodes of paroxysmal atrial fibrillation with sick sinus syndrome and sinus pauses on routine monitor and subsequently underwent permanent pacemaker placement by Dr. [name].  He has remote history of pulmonary embolism and had been on Coumadin anticoagulation for many years but was later converted to Eliquis in 2019 following a fall with a hematoma.  He has been maintaining paced rhythm with relatively low burden  of atrial fibrillation on subsequent device monitoring.  Echocardiogram performed September 2020 revealed mildly reduced left ventricular systolic function with ejection fraction estimated 45 to 50%.  There was evidence of moderate aortic stenosis with moderate mitral regurgitation.  The patient states that approximately 2 months ago he began to decline fairly quickly.  He developed rapidly progressive symptoms of exertional shortness of breath and fatigue ultimately resulting in hospitalization in November with resting shortness of breath and pulmonary edema.  He ruled out for acute myocardial infarction based on serial cardiac enzymes.  Echocardiogram performed May 12, 2020 revealed moderate to severe left ventricular systolic dysfunction with ejection fraction estimated only 30 to  35%.  There was severe aortic stenosis with peak velocity across aortic valve measured 3.9 m/s corresponding to mean transvalvular gradient 35 mmHg and aortic valve area calculated only 0.8 cm with DVI reported 0.20.  There was severe mitral regurgitation with moderate mitral annular calcification and severe tricuspid regurgitation.  Symptoms improved rapidly with IV diuresis and he was discharged home within only 2 to 3 days.  He was referred to the multidisciplinary heart valve clinic and has been evaluated previously by Dr. Burt Knack.  TEE and diagnostic cardiac catheterization were performed May 27, 2020.  TEE confirmed the presence of moderately decreased left ventricular systolic function with ejection fraction estimated only 30 to 35%.  There was moderate right ventricular chamber enlargement with moderately reduced right ventricular function and severe pulmonary hypertension.  There was severe aortic stenosis with trivial aortic insufficiency.  There was moderate mitral regurgitation and moderate tricuspid regurgitation.  Catheterization revealed severe native coronary artery disease with chronic occlusion of the mid left anterior descending coronary artery, the right coronary artery, and multiple branches of the left circumflex system.  All 5 previous coronary artery bypass graft remained widely patent without any significant disease.  Mean transvalvular gradient across the aortic valve was measured 25 mmHg consistent with low flow low gradient severe aortic stenosis. Pulmonary artery pressures were moderately elevated.  The patient has been evaluated by the multidisciplinary valve team and felt to have severe, symptomatic aortic stenosis and to be a suitable candidate for TAVR, which was set up for 06/28/20. This was ultimately delayed due to positive covid 19 test and case was r/s to 07/12/20.   Hospital Course     Consultants: none   Severe AS: s/p successful TAVR with a 29 mm Edwards Sapien 3  THV via the TF approach on 07/12/20. Post operative echo completed but pending formal read. Groin sites are stable. ECG with a paced rhythm. He will be resumed on his home Asprin 81 mg daily and renally dosed Eliquis 2.5mg  BID. Plan discharge home today with close follow up in the office next week.    PAF: resume eliquis tonight.   DMT2: treated with SSI while admitted. Resume home meds at discharge.   CAD s/p CABG: pre TAVR cath showed 5/5 patent bypass grafts. Continue medical therapy.   SSS s/p PPM: followed by Dr. [name] _____________  Discharge Vitals Blood pressure (!) 127/58, pulse 63, temperature 99.4 F (37.4 C), temperature source Oral, resp. rate 18, height 5\' 4"  (1.626 m), weight 51.9 kg, SpO2 97 %.  Filed Weights   07/12/20 0654 07/13/20 0300  Weight: 53.5 kg 51.9 kg    Labs & Radiologic Studies    CBC Recent Labs    07/12/20 1203 07/13/20 0221  WBC  --  9.2  HGB 10.2* 10.8*  HCT 30.0* 33.4*  MCV  --  101.8*  PLT  --  536*   Basic Metabolic Panel Recent Labs    07/12/20 1203 07/13/20 0221  NA 139 137  K 4.4 4.0  CL 104 105  CO2  --  23  GLUCOSE 124* 56*  BUN 34* 31*  CREATININE 1.50* 1.63*  CALCIUM  --  8.4*  MG  --  2.0   Liver Function Tests No results for input(s): AST, ALT, ALKPHOS, BILITOT, PROT, ALBUMIN in the last 72 hours. No results for input(s): LIPASE, AMYLASE in the last 72 hours. Cardiac Enzymes No results for input(s): CKTOTAL, CKMB, CKMBINDEX, TROPONINI in the last 72 hours. BNP Invalid input(s): POCBNP D-Dimer No results for input(s): DDIMER in the last 72 hours. Hemoglobin A1C No results for input(s): HGBA1C in the last 72 hours. Fasting Lipid Panel No results for input(s): CHOL, HDL, LDLCALC, TRIG, CHOLHDL, LDLDIRECT in the last 72 hours. Thyroid Function Tests No results for input(s): TSH, T4TOTAL, T3FREE, THYROIDAB in the last 72 hours.  Invalid input(s): FREET3 _____________  DG Chest 2 View  Result Date:  07/08/2020 CLINICAL DATA:  Pre-admission for TAVR EXAM: CHEST - 2 VIEW COMPARISON:  06/23/2020 chest radiograph. FINDINGS: Stable configuration of 2 lead left subclavian pacemaker. CABG clips overlie the mediastinum. Intact sternotomy wires. Stable cardiomediastinal silhouette with borderline mild cardiomegaly. No pneumothorax. No pleural effusion. Lungs appear clear, with no acute consolidative airspace disease and no pulmonary edema. IMPRESSION: Borderline mild cardiomegaly without pulmonary edema. No active pulmonary disease. Electronically Signed   By: Ilona Sorrel M.D.   On: 07/08/2020 17:08   DG Chest 2 View  Result Date: 06/23/2020 CLINICAL DATA:  Preoperative aortic valve replacement EXAM: CHEST - 2 VIEW COMPARISON:  May 12, 2020 FINDINGS: There is lower lobe bronchiectatic change. No edema or airspace opacity. Heart is upper normal in size with pulmonary vascularity normal. Pacemaker leads attached to right atrium and right ventricle. Patient is status post coronary artery bypass grafting. There is aortic atherosclerosis. No adenopathy. No bone lesions. IMPRESSION: Lower lobe bronchiectatic change. Currently no edema or airspace opacity. Heart upper normal in size with postoperative changes. Pacemaker leads attached to right atrium and right ventricle. Aortic Atherosclerosis (ICD10-I70.0). Electronically Signed   By: Lowella Grip III M.D.   On: 06/23/2020 10:26   DG Chest Port 1 View  Result Date: 07/12/2020 CLINICAL DATA:  Status post TAVR EXAM: PORTABLE CHEST 1 VIEW COMPARISON:  Radiograph 07/08/2020 FINDINGS: Interval valve replacement. Midline sternotomy related to prior CABG. LEFT-sided pacemaker. No effusion, infiltrate, pneumothorax.  No significant atelectasis. IMPRESSION: No complication following TAVR procedure. Electronically Signed   By: Suzy Bouchard M.D.   On: 07/12/2020 14:20   ECHOCARDIOGRAM LIMITED  Result Date: 07/12/2020    ECHOCARDIOGRAM LIMITED REPORT   Patient  Name:   Bryan Carney Date of Exam: 07/12/2020 Medical Rec #:  [id number]     Height:       64.0 in Accession #:    [id number]    Weight:       118.0 lb Date of Birth:  [birth year]     BSA:          1.563 m Patient Age:    6 years      BP:           196/60 mmHg Patient Gender: M             HR:           60 bpm. Exam Location:  Inpatient Procedure: Limited Echo,  Cardiac Doppler and Color Doppler                            MODIFIED REPORT: This report was modified by Jenkins Rouge MD on 07/12/2020 due to update.  Indications:     Aortic stenosis  History:         Patient has prior history of Echocardiogram examinations, most                  recent 05/27/2020. CHF, CAD, Stroke, Aortic Valve Disease; Risk                  Factors:Hypertension and Dyslipidemia.  Sonographer:     Dustin Flock Referring Phys:  [place] Phys: Jenkins Rouge MD IMPRESSIONS  1. Septal and inferior basal hypokinesis . Left ventricular ejection fraction, by estimation, is 40 to 45%. The left ventricle has mildly decreased function. The left ventricle demonstrates regional wall motion abnormalities (see scoring diagram/findings for description). The left ventricular internal cavity size was moderately dilated. There is moderate left ventricular hypertrophy.  2. The mitral valve is degenerative. Moderate mitral valve regurgitation. No evidence of mitral stenosis. Moderate mitral annular calcification.  3. Pre TAVR: Tri cupsid with near fusion of left/right cusps Trivial AR Severe AS with mean gradient 29 mm peak 48 mmHg supine in cath lab AVA 0.73 cm2         Post TAVR well positioned 29 mm Sapien 3 valve No PVL Mean gradient 2 peak 3 mmHg AVR , AVA 2.64 cm2 . The aortic valve is bicuspid. There is severe calcifcation of the aortic valve. There is severe thickening of the aortic valve. Aortic valve regurgitation is trivial. Severe aortic valve stenosis. Procedure Date: 07/12/2020. FINDINGS  Left Ventricle: Septal and  inferior basal hypokinesis. Left ventricular ejection fraction, by estimation, is 40 to 45%. The left ventricle has mildly decreased function. The left ventricle demonstrates regional wall motion abnormalities. The left ventricular internal cavity size was moderately dilated. There is moderate left ventricular hypertrophy. Pericardium: There is no evidence of pericardial effusion. Mitral Valve: The mitral valve is degenerative in appearance. There is moderate thickening of the mitral valve leaflet(s). There is moderate calcification of the mitral valve leaflet(s). Moderate mitral annular calcification. Moderate mitral valve regurgitation. No evidence of mitral valve stenosis. Aortic Valve: Pre TAVR: Tri cupsid with near fusion of left/right cusps Trivial AR Severe AS with mean gradient 29 mm peak 48 mmHg supine in cath lab AVA 0.73 cm2 Post TAVR well positioned 29 mm Sapien 3 valve No PVL Mean gradient 2 peak 3 mmHg AVR , AVA 2.64 cm2. The aortic valve is bicuspid. There is severe calcifcation of the aortic valve. There is severe thickening of the aortic valve. There is severe aortic valve annular calcification. Aortic valve regurgitation is trivial. Severe aortic stenosis is present. Aortic valve mean gradient measures 2.0 mmHg. Aortic valve peak gradient measures 3.4 mmHg. Aortic valve area, by VTI measures 2.64 cm. There is a Sapien prosthetic, stented (TAVR) valve present in the aortic position. LEFT VENTRICLE PLAX 2D LVOT diam:     2.30 cm LV SV:         56 LV SV Index:   36 LVOT Area:     4.15 cm  AORTIC VALVE AV Area (Vmax):    2.55 cm AV Area (Vmean):   2.44 cm AV Area (VTI):     2.64 cm AV Vmax:  91.55 cm/s AV Vmean:          62.600 cm/s AV VTI:            0.211 m AV Peak Grad:      3.4 mmHg AV Mean Grad:      2.0 mmHg LVOT Vmax:         56.10 cm/s LVOT Vmean:        36.800 cm/s LVOT VTI:          0.134 m LVOT/AV VTI ratio: 0.64  SHUNTS Systemic VTI:  0.13 m Systemic Diam: 2.30 cm Jenkins Rouge  MD Electronically signed by Jenkins Rouge MD Signature Date/Time: 07/12/2020/12:07:26 PM    Final (Updated)    Structural Heart Procedure  Result Date: 07/12/2020 See surgical note for result.  Disposition   Pt is being discharged home today in good condition.  Follow-up Plans & Appointments     Follow-up Information    Eileen Stanford, PA-C. Go on 07/21/2020.   Specialties: Cardiology, Radiology Why: @ 2:30pm, please arrive at least 10 minutes early Contact information: [place] [address] [phone]              Discharge Instructions    Amb Referral to Cardiac Rehabilitation   Complete by: As directed    To [facility]   Diagnosis: Valve Replacement   Valve: Aortic Comment - TAVR   After initial evaluation and assessments completed: Virtual Based Care may be provided alone or in conjunction with Phase 2 Cardiac Rehab based on patient barriers.: Yes      Discharge Medications   Allergies as of 07/13/2020      Reactions   Lopressor [metoprolol Tartrate] Other (See Comments)   Severe BP drop   Lipitor [atorvastatin]    Muscle aches      Medication List    TAKE these medications   amiodarone 200 MG tablet Commonly known as: PACERONE TAKE ONE TABLET BY MOUTH DAILY--please make overdue follow up appt for further refills [phone] 1st attempt What changed: See the new instructions.   apixaban 2.5 MG Tabs tablet Commonly known as: ELIQUIS Take 1 tablet (2.5 mg total) by mouth 2 (two) times daily.   aspirin EC 81 MG tablet Take 81 mg by mouth daily.   cetirizine 10 MG tablet Commonly known as: ZYRTEC Take 10 mg by mouth daily.   ELDERBERRY PO Take 300 mg by mouth daily.   ferrous sulfate 325 (65 FE) MG tablet Take 325 mg by mouth in the morning and at bedtime.   furosemide 20 MG tablet Commonly known as: LASIX Take 1 tablet (20 mg total) by mouth daily.   glimepiride 4 MG tablet Commonly known as: AMARYL Take 1  tablet (4 mg total) by mouth daily with breakfast.   linagliptin 5 MG Tabs tablet Commonly known as: TRADJENTA Take 1 tablet (5 mg total) by mouth daily.   losartan 25 MG tablet Commonly known as: COZAAR Take 1 tablet (25 mg total) by mouth daily.   pantoprazole 40 MG tablet Commonly known as: PROTONIX Take 40 mg by mouth daily.   rosuvastatin 10 MG tablet Commonly known as: CRESTOR Take 1 tablet (10 mg total) by mouth daily. What changed: when to take this       Outstanding Labs/Studies   none  Duration of Discharge Encounter   Greater than 30 minutes including physician time.  SignedAngelena Form, PA-C 07/13/2020, 10:50 AM [phone]

## 2020-07-13 NOTE — Progress Notes (Signed)
  Echocardiogram 2D Echocardiogram has been performed.  Parthenia Tellefsen G Ruhee Enck 07/13/2020, 1:31 PM

## 2020-07-13 NOTE — Progress Notes (Signed)
CARDIAC REHAB PHASE I   PRE:  Rate/Rhythm: 70 pacing    BP: sitting 127/58    SaO2: 97 RA  MODE:  Ambulation: 470 ft   POST:  Rate/Rhythm: 91 pacing    BP: sitting 138/89     SaO2: 97 RA  Pts legs initially weak but steadier with distance. Used gait belt, contact guard. No AD. No c/o. To recliner. VSS. Discussed walking at home, restrictions. Pt asking about driving. Will refer to [facility].  [id number]   [place], ACSM 07/13/2020 9:09 AM

## 2020-07-13 NOTE — Progress Notes (Signed)
D/C instructions given to patient and his daughter. Medications and wound care reviewed. All questions answered. IV clean and intact. Daughter to escort pt home.  Clyde Canterbury, RN

## 2020-07-13 NOTE — Progress Notes (Signed)
Mobility Specialist: Progress Note   07/13/20 1219  Mobility  Activity Ambulated in hall  Level of Assistance Contact guard assist, steadying assist  Assistive Device None  Distance Ambulated (ft) 470 ft  Mobility Response Tolerated well  Mobility performed by Mobility specialist  $Mobility charge 1 Mobility   Pre-Mobility: 61 HR, 123/49 BP, 100% SpO2 Post-Mobility: 81 HR, 144/56 BP, 99% SpO2  Pt asx during ambulation. Pt very eager for discharge.   [facility] Cale Bethard Mobility Specialist

## 2020-07-13 NOTE — Progress Notes (Signed)
Inpatient Diabetes Program Recommendations  AACE/ADA: New Consensus Statement on Inpatient Glycemic Control (2015)  Target Ranges:  Prepandial:   less than 140 mg/dL      Peak postprandial:   less than 180 mg/dL (1-2 hours)      Critically ill patients:  140 - 180 mg/dL   Results for ROD, MAJERUS (MRN [id number]) as of 07/13/2020 07:05  Ref. Range 07/12/2020 07:03 07/12/2020 07:58 07/12/2020 14:05 07/12/2020 16:29 07/12/2020 21:03  Glucose-Capillary Latest Ref Range: 70 - 99 mg/dL 69 (L) 143 (H) 101 (H) 144 (H)  2 units NOVOLOG  128 (H)  2 units NOVOLOG    Results for AUDI, WETTSTEIN (MRN [id number]) as of 07/13/2020 07:05  Ref. Range 07/13/2020 06:06  Glucose-Capillary Latest Ref Range: 70 - 99 mg/dL 62 (L)    Admit with: Severe Aortic Stenosis/ Underwent TAVR  History: DM  Home DM Meds: Amaryl 4 mg Daily       Tradjenta 5 mg Daily  Current Orders: Novolog 0-24 units TID AC/HS     MD- Note patient with Hypoglycemia yesterday AM and Hypoglycemia again this AM.  Pt did receive Novolog insulin last PM which may have contributed to early AM HYPO today.  1. Please consider reducing the Novolog SSI to the Sensitive Scale 0-9 units TID Per the Glycemic Control Order set  2. Stop Bedtime SSI coverage    --Will follow patient during hospitalization--  Wyn Quaker RN, MSN, CDE Diabetes Coordinator Inpatient Glycemic Control Team Team Pager: [phone] (8a-5p)

## 2020-07-14 ENCOUNTER — Encounter (HOSPITAL_COMMUNITY): Payer: Self-pay | Admitting: Cardiovascular Disease

## 2020-07-14 ENCOUNTER — Telehealth: Payer: Self-pay | Admitting: Physician Assistant

## 2020-07-14 NOTE — Telephone Encounter (Signed)
  [place] VALVE TEAM   Patient contacted regarding discharge from [facility] on 1/19  Patient understands to follow up with provider Nell Range on 1/27 at [place].  Patient understands discharge instructions? yes Patient understands medications and regimen? yes Patient understands to bring all medications to this visit? Yes  He is doing great. Already has been to breakfast today and gone into work with his daughter [name].   Angelena Form PA-C  [facility]

## 2020-07-15 ENCOUNTER — Telehealth (HOSPITAL_COMMUNITY): Payer: Self-pay

## 2020-07-15 NOTE — Telephone Encounter (Signed)
Per phase I, faxed cardiac rehab referral to [facility]. °

## 2020-07-21 ENCOUNTER — Encounter: Payer: Self-pay | Admitting: Physician Assistant

## 2020-07-21 ENCOUNTER — Other Ambulatory Visit: Payer: Self-pay

## 2020-07-21 ENCOUNTER — Ambulatory Visit: Payer: Medicare HMO | Admitting: Physician Assistant

## 2020-07-21 VITALS — BP 130/60 | HR 84 | Ht 64.0 in | Wt 124.0 lb

## 2020-07-21 DIAGNOSIS — I480 Paroxysmal atrial fibrillation: Secondary | ICD-10-CM

## 2020-07-21 DIAGNOSIS — Z951 Presence of aortocoronary bypass graft: Secondary | ICD-10-CM

## 2020-07-21 DIAGNOSIS — Z950 Presence of cardiac pacemaker: Secondary | ICD-10-CM | POA: Diagnosis not present

## 2020-07-21 DIAGNOSIS — Z952 Presence of prosthetic heart valve: Secondary | ICD-10-CM | POA: Diagnosis not present

## 2020-07-21 NOTE — Progress Notes (Signed)
[name]                                     Cardiology Office Note:    Date:  07/21/2020   ID:  Bryan Carney, DOB [birth year], MRN [id number]  PCP:  Bryan Dandy, NP  [facility] Cardiologist:  [name], MD / Dr. Angelena Carney and Dr. Roxy Carney (TAVR) [facility] Electrophysiologist:  Will Meredith Leeds, MD   Referring MD: Bryan Dandy, NP   CC: [facility] s/p TAVR  History of Present Illness:    Bryan Carney is a 80 y.o. male with a hx of CAD s/p CABG, CVA in setting of carotid artery stenosis s/p CEA, DMT2, PAF on chronic anticoagulation, PE, anemia, CKD stage III, COPD, SSS s/p PPM, HTN, HLD, recent admission for acute on chronic combined systolic HF and moderate MR/TR as well as severe AS s/p TAVR (07/12/20) who presents to clinic for follow up.   Patient's cardiac history dates back to 1996 when he initially presented with a stroke. He subsequently underwent left carotid endarterectomy and was left with no residual. He developed coronary artery disease and underwent coronary artery bypass grafting x5 in 1999 by Dr. [name]. He has been followed carefully ever since by Dr. Gwenlyn Carney. In 2019 he was Carney to have episodes of paroxysmal atrial fibrillation with sick sinus syndrome and sinus pauses on routine monitor and subsequently underwent permanent pacemaker placement by Dr. Curt Carney. He has remote history of pulmonary embolism and had been on Coumadin anticoagulation for many years but was later converted to Eliquis in 2019 following a fall with a hematoma. He has been maintaining paced rhythm with relatively low burden of atrial fibrillation on subsequent device monitoring. Echocardiogram performed September 2020 revealed mildly reduced left ventricular systolic function with ejection fraction estimated 45 to 50%. There was evidence of moderate aortic stenosis with moderate mitral regurgitation.  The patient states that  approximately 2 months ago he began to decline fairly quickly. He developed rapidly progressive symptoms of exertional shortness of breath and fatigue ultimately resulting in hospitalization in November with resting shortness of breath and pulmonary edema. He ruled out for acute myocardial infarction based on serial cardiac enzymes. Echocardiogram performed May 12, 2020 revealed moderate to severe left ventricular systolic dysfunction with ejection fraction estimated only 30 to 35%. There was severe aortic stenosis with peak velocity across aortic valve measured 3.9 m/s corresponding to mean transvalvular gradient 35 mmHg and aortic valve area calculated only 0.8 cm with DVI reported 0.20. There was severe mitral regurgitation with moderate mitral annular calcification and severe tricuspid regurgitation. Symptoms improved rapidly with IV diuresis and he was discharged home within only 2 to 3 days. He was referred to the multidisciplinary heart valve clinic and has been evaluated previously by Dr. Burt Knack. TEE and diagnostic cardiac catheterization were performed May 27, 2020. TEE confirmed the presence of moderately decreased left ventricular systolic function with ejection fraction estimated only 30 to 35%. There was moderate right ventricular chamber enlargement with moderately reduced right ventricular function and severe pulmonary hypertension. There was severe aortic stenosis with trivial aortic insufficiency. There was moderate mitral regurgitation and moderate tricuspid regurgitation. Catheterization revealed severe native coronary artery disease with chronic occlusion of the mid left anterior descending coronary artery, the right coronary artery, and multiple branches of the left circumflex system. All 5 previous  coronary artery bypass graft remained widely patent without any significant disease.   He was evaluated by the multidisciplinary valve team and underwent successful TAVR  with a 29 mm Edwards Sapien 3 THV via the TF approach on 07/12/20. Post operative echo showed EF 35-40%, normally functioning TAVR with a mean gradient of 9 mmHg and no PVL. Moderate MR/TR. He was resumed on his home Asprin 81 mg daily and renally dosed Eliquis 2.5mg  BID.   Today he presents to clinic for follow up. Here with his daughter. No CP or SOB. No LE edema, orthopnea or PND. No dizziness or syncope. No blood in stool or urine. No palpitations. Already back at work. Eager to drive.   Past Medical History:  Diagnosis Date  . Anemia   . Arthritis   . CAD (coronary artery disease)   . Chronic kidney disease    stage 3 per daughter  . COPD (chronic obstructive pulmonary disease) (Bryan Carney)   . Diabetes ([place])   . Fibromyalgia    25 years ago.  Marland Kitchen History of CVA (cerebrovascular accident)   . Hx pulmonary embolism   . Hyperlipidemia   . Hypertension   . Myocardial infarction (Bryan Carney)   . Pneumonia   . Presence of permanent cardiac pacemaker   . Prostate cancer (Bryan Carney)   . PVOD (pulmonary veno-occlusive disease) (Bryan Carney)   . S/P CABG x 5 10/19/1997   LIMA to LAD, SVG to ramus, SVG to OM2-OM3, SVG to PDA  . S/P TAVR (transcatheter aortic valve replacement) 07/12/2020   s/p TAVR with a 29 mm Edwards Sapien 3 via the TF approach by Dr. Angelena Carney and Dr. Roxy Carney  . Squamous cell carcinoma     Past Surgical History:  Procedure Laterality Date  . BACK SURGERY    . CARDIAC CATHETERIZATION  10/15/1997   Recommend complete revascularization by CABG  . CARDIOVASCULAR STRESS TEST  06/12/2012   No evidence of ischemia  . CAROTID DOPPLER  08/27/2012   Rt bulb/proximal ICA demonstrated a mild-moderate amount of fibrous plaque without evidence of a significant diameter reduction or any other vascular abnormality.  . CAROTID ENDARTERECTOMY Left 02/23/1995  . CORONARY ANGIOPLASTY    . CORONARY ARTERY BYPASS GRAFT  10/19/1997   x5. LIMA-LAD, SVG-PDA, SVG-OM1, seq SVG-fist and second branches of ramus intermedius.   Marland Kitchen HERNIA REPAIR     groin area.  Randolm Idol / REPLACE / REMOVE PACEMAKER    . NO PAST SURGERIES    . PACEMAKER IMPLANT N/A 02/27/2018   Procedure: PACEMAKER IMPLANT;  Surgeon: Constance Haw, MD;  Location: [place] [facility];  Service: Cardiovascular;  Laterality: N/A;  . RIGHT/LEFT HEART CATH AND CORONARY/GRAFT ANGIOGRAPHY N/A 05/27/2020   Procedure: RIGHT/LEFT HEART CATH AND CORONARY/GRAFT ANGIOGRAPHY;  Surgeon: Sherren Mocha, MD;  Location: [place] [facility];  Service: Cardiovascular;  Laterality: N/A;  . TEE WITHOUT CARDIOVERSION N/A 05/27/2020   Procedure: TRANSESOPHAGEAL ECHOCARDIOGRAM (TEE);  Surgeon: Sanda Klein, MD;  Location: [place];  Service: Cardiovascular;  Laterality: N/A;  . TEE WITHOUT CARDIOVERSION N/A 07/12/2020   Procedure: TRANSESOPHAGEAL ECHOCARDIOGRAM (TEE);  Surgeon: Burnell Blanks, MD;  Location: [place] [facility];  Service: Open Heart Surgery;  Laterality: N/A;  . TRANSCATHETER AORTIC VALVE REPLACEMENT, TRANSFEMORAL N/A 07/12/2020   Procedure: TRANSCATHETER AORTIC VALVE REPLACEMENT, TRANSFEMORAL;  Surgeon: Burnell Blanks, MD;  Location: [place] [facility];  Service: Open Heart Surgery;  Laterality: N/A;    Current Medications: Current Meds  Medication Sig  . amiodarone (PACERONE)  200 MG tablet TAKE ONE TABLET BY MOUTH DAILY--please make overdue follow up appt for further refills [phone] 1st attempt  . apixaban (ELIQUIS) 2.5 MG TABS tablet Take 1 tablet (2.5 mg total) by mouth 2 (two) times daily.  Marland Kitchen aspirin EC 81 MG tablet Take 81 mg by mouth daily.  . cetirizine (ZYRTEC) 10 MG tablet Take 10 mg by mouth daily.  Marland Kitchen ELDERBERRY PO Take 300 mg by mouth daily.  . ferrous sulfate 325 (65 FE) MG tablet Take 325 mg by mouth in the morning and at bedtime.   . furosemide (LASIX) 20 MG tablet Take 1 tablet (20 mg total) by mouth daily.  Marland Kitchen glimepiride (AMARYL) 4 MG tablet Take 1 tablet (4 mg total) by mouth daily with breakfast.  .  linagliptin (TRADJENTA) 5 MG TABS tablet Take 1 tablet (5 mg total) by mouth daily.  Marland Kitchen losartan (COZAAR) 25 MG tablet Take 1 tablet (25 mg total) by mouth daily.  . rosuvastatin (CRESTOR) 10 MG tablet Take 1 tablet (10 mg total) by mouth daily.     Allergies:   Lopressor [metoprolol tartrate] and Lipitor [atorvastatin]   Social History   Socioeconomic History  . Marital status: Married    Spouse name: Doris Mcgilvery  . Number of children: 1  . Years of education: High school 12 years  . Highest education level: 12th grade  Occupational History  . Occupation: Immunologist    Comment: owns car business  Tobacco Use  . Smoking status: Never Smoker  . Smokeless tobacco: Never Used  Vaping Use  . Vaping Use: Never used  Substance and Sexual Activity  . Alcohol use: No  . Drug use: Never  . Sexual activity: Not Currently  Other Topics Concern  . Not on file  Social History Narrative  . Not on file   Social Determinants of Health   Financial Resource Strain: Not on file  Food Insecurity: Not on file  Transportation Needs: Not on file  Physical Activity: Not on file  Stress: Not on file  Social Connections: Not on file     Family History: The patient's family history includes Cancer in his sister and sister; Diabetes in his brother, brother, brother, and brother; Heart attack in his father.  ROS:   Please see the history of present illness.    All other systems reviewed and are negative.  EKGs/Labs/Other Studies Reviewed:    The following studies were reviewed today:  TAVR OPERATIVE NOTE   Date of Procedure:07/12/2020  Preoperative Diagnosis:Severe Aortic Stenosis   Postoperative Diagnosis:Same   Procedure:   Transcatheter Aortic Valve Replacement - PercutaneousRightTransfemoral Approach Edwards Sapien 3 THV (size 21mm, model # 9600TFX, serial [id number])  Co-Surgeons:Clarence  H. Bryan Manns, MD and Lauree Chandler, MD  Anesthesiologist:Stephen Gifford Shave, MD and Belinda Block, MD  Echocardiographer:Peter Johnsie Cancel, MD  Pre-operative Echo Findings: ? Severe aortic stenosis ? Mild-moderateleft ventricular systolic dysfunction  Post-operative Echo Findings: ? Noparavalvular leak ? Unchangedleft ventricular systolic function  _______________________  Echo 07/13/20 IMPRESSIONS  1. Diffuse hypokinesis worse in the septum and inferior base . Left  ventricular ejection fraction, by estimation, is 35 to 40%. The left  ventricle has moderately decreased function. The left ventricle has no  regional wall motion abnormalities. The left  ventricular internal cavity size was mildly dilated. There is moderate  left ventricular hypertrophy. Left ventricular diastolic parameters are  indeterminate.  2. Right ventricular systolic function is normal. The right ventricular  size is normal. There is mildly elevated pulmonary  artery systolic  pressure.  3. Left atrial size was moderately dilated.  4. Degree of MR seen best on image 79 3 chamber view . The mitral valve  is degenerative. Moderate mitral valve regurgitation. No evidence of  mitral stenosis. Moderate mitral annular calcification.  5. Tricuspid valve regurgitation is moderate.  6. Post TAVR with 29 mm Sapien 3 valve no significant PVL mean gradient 9  peak 17 mmHg with AVA 1.7 cm2 and DVI 0.41 . The aortic valve has been  repaired/replaced. Aortic valve regurgitation is not visualized. No aortic  stenosis is present. There is a  29 mm Sapien prosthetic (TAVR) valve present in the aortic position.  Procedure Date: 07/12/2020.  7. Aortic dilatation noted. There is mild dilatation of the ascending  aorta, measuring 41 mm.  8. The inferior vena cava is normal in size with greater than 50%  respiratory variability, suggesting right atrial pressure of 3 mmHg.    EKG:  EKG  is NOT ordered today.   Recent Labs: 01/04/2020: TSH 1.060 07/08/2020: ALT 23; B Natriuretic Peptide 673.4 07/13/2020: BUN 31; Creatinine, Ser 1.63; Hemoglobin 10.8; Magnesium 2.0; Platelets 139; Potassium 4.0; Sodium 137  Recent Lipid Panel No results Carney for: CHOL, TRIG, HDL, CHOLHDL, VLDL, LDLCALC, LDLDIRECT   Risk Assessment/Calculations:    CHA2DS2-VASc Score = 6  This indicates a 9.7% annual risk of stroke. The patient's score is based upon: CHF History: Yes HTN History: Yes Diabetes History: Yes Stroke History: No Vascular Disease History: Yes Age Score: 2 Gender Score: 0    Physical Exam:    VS:  BP 130/60   Pulse 84   Ht 5\' 4"  (1.626 m)   Wt 124 lb (56.2 kg)   SpO2 97%   BMI 21.28 kg/m     Wt Readings from Last 3 Encounters:  07/21/20 124 lb (56.2 kg)  07/13/20 114 lb 5.6 oz (51.9 kg)  07/08/20 124 lb 14.4 oz (56.7 kg)     GEN: Well nourished, well developed in no acute distress HEENT: Normal NECK: No JVD; No carotid bruits LYMPHATICS: No lymphadenopathy CARDIAC: RRR, no murmurs, rubs, gallops RESPIRATORY:  Clear to auscultation without rales, wheezing or rhonchi  ABDOMEN: Soft, non-tender, non-distended MUSCULOSKELETAL:  No edema; No deformity  SKIN: Warm and dry NEUROLOGIC:  Alert and oriented x 3 PSYCHIATRIC:  Normal affect   ASSESSMENT:    1. S/P TAVR (transcatheter aortic valve replacement)   2. Paroxysmal atrial fibrillation (HCC)   3. S/P CABG x 5   4. Cardiac pacemaker in situ    PLAN:    In order of problems listed above:  Severe [id number] great. Groin sites healing well. SBE prophylaxis discussed; the patient is edentulous and does not go to the dentist. Continue on his chronic home aspirin and low dose Eliquis. I will see him back next month for follow up and echo  PAF: paced. Continue eliquis.   CAD s/p CABG: pre TAVR cath showed 5/5 patent bypass grafts. Continue medical therapy.   SSS s/p PPM: followed by Dr.  Curt Carney   Medication Adjustments/Labs and Tests Ordered: Current medicines are reviewed at length with the patient today.  Concerns regarding medicines are outlined above.  No orders of the defined types were placed in this encounter.  No orders of the defined types were placed in this encounter.   Patient Instructions  Medication Instructions:  Your provider recommends that you continue on your current medications as directed. Please refer to the Current Medication list given  to you today.   *If you need a refill on your cardiac medications before your next appointment, please call your pharmacy*  Follow-Up: Please keep your appointments as scheduled!    Signed, Bryan Form, PA-C  07/21/2020 2:52 PM    [facility]

## 2020-07-21 NOTE — Patient Instructions (Signed)
Medication Instructions:  Your provider recommends that you continue on your current medications as directed. Please refer to the Current Medication list given to you today.   *If you need a refill on your cardiac medications before your next appointment, please call your pharmacy*  Follow-Up: Please keep your appointments as scheduled!

## 2020-08-04 ENCOUNTER — Ambulatory Visit (INDEPENDENT_AMBULATORY_CARE_PROVIDER_SITE_OTHER): Payer: Medicare HMO | Admitting: Podiatry

## 2020-08-04 ENCOUNTER — Other Ambulatory Visit: Payer: Self-pay

## 2020-08-04 DIAGNOSIS — D689 Coagulation defect, unspecified: Secondary | ICD-10-CM

## 2020-08-04 DIAGNOSIS — B351 Tinea unguium: Secondary | ICD-10-CM

## 2020-08-04 DIAGNOSIS — E1169 Type 2 diabetes mellitus with other specified complication: Secondary | ICD-10-CM | POA: Diagnosis not present

## 2020-08-04 DIAGNOSIS — E1151 Type 2 diabetes mellitus with diabetic peripheral angiopathy without gangrene: Secondary | ICD-10-CM | POA: Diagnosis not present

## 2020-08-04 NOTE — Progress Notes (Signed)
  Subjective:  Patient ID: Bryan Carney, male    DOB: Jan 01, 1941,  MRN: [id number]  No chief complaint on file.  80 y.o. male presents with the above complaint. History confirmed with patient. Here for complaint of painful nails that curl into the skin.  Objective:  Physical Exam: warm, good capillary refill, nail exam onychomycosis of the toenails and pincer nails, no trophic changes or ulcerative lesions, decreased DP and PT pulses, and normal sensory exam. HPK submet 3/4 bilat Left Foot: normal exam, no swelling, tenderness, instability; ligaments intact, full range of motion of all ankle/foot joints  Right Foot: normal exam, no swelling, tenderness, instability; ligaments intact, full range of motion of all ankle/foot joints   No images are attached to the encounter.  Assessment:   1. Onychomycosis of multiple toenails with type 2 diabetes mellitus and peripheral angiopathy ([place])   2. Coagulation defect ([place])     Plan:  Patient was evaluated and treated and all questions answered.  Diabetes, PAD and Coagulation Defect -Patient is diabetic with a qualifying condition for at risk foot care.   Procedure: Nail Debridement Type of Debridement: manual, sharp debridement. Instrumentation: Nail nipper, rotary burr. Number of Nails: 10

## 2020-08-09 NOTE — Progress Notes (Addendum)
[facility] [place]                                     Cardiology Office Note:    Date:  08/10/2020   ID:  Bryan Carney, DOB 08/16/1940, MRN [id number]  PCP:  [name], NP  [facility] Cardiologist:  [name], MD / Dr. Angelena Form and Dr. Roxy Manns (TAVR) [facility] Electrophysiologist:  Will Meredith Leeds, MD   Referring MD: [name], NP   CC: 1 month s/p TAVR  History of Present Illness:    Bryan Carney is a 80 y.o. male with a hx of CAD s/p CABG, CVA in setting of carotid artery stenosis s/p CEA, DMT2, PAF on chronic anticoagulation, PE, anemia, CKD stage III, COPD, SSS s/p PPM, HTN, HLD, recent admission for acute on chronic combined systolic HF and moderate MR/TR as well as severe AS s/p TAVR (07/12/20) who presents to clinic for follow up.   Patient's cardiac history dates back to 1996 when he initially presented with a stroke. He subsequently underwent left carotid endarterectomy and was left with no residual. He developed coronary artery disease and underwent coronary artery bypass grafting x5 in 1999 by Dr. [name]. He has been followed carefully ever since by Dr. [name]. In 2019 he was found to have episodes of paroxysmal atrial fibrillation with sick sinus syndrome and sinus pauses on routine monitor and subsequently underwent permanent pacemaker placement by Dr. [name]. He has remote history of pulmonary embolism and had been on Coumadin anticoagulation for many years but was later converted to Eliquis in 2019 following a fall with a hematoma. He has been maintaining paced rhythm with relatively low burden of atrial fibrillation on subsequent device monitoring. Echocardiogram performed September 2020 revealed mildly reduced left ventricular systolic function with ejection fraction estimated 45 to 50%. There was evidence of moderate aortic stenosis with moderate mitral regurgitation.  The patient states that  approximately 3 months ago he began to decline fairly quickly. He developed rapidly progressive symptoms of exertional shortness of breath and fatigue ultimately resulting in hospitalization in November with resting shortness of breath and pulmonary edema. He ruled out for acute myocardial infarction based on serial cardiac enzymes. Echocardiogram performed May 12, 2020 revealed moderate to severe left ventricular systolic dysfunction with ejection fraction estimated only 30 to 35%. There was severe aortic stenosis with peak velocity across aortic valve measured 3.9 m/s corresponding to mean transvalvular gradient 35 mmHg and aortic valve area calculated only 0.8 cm with DVI reported 0.20. There was severe mitral regurgitation with moderate mitral annular calcification and severe tricuspid regurgitation. Symptoms improved rapidly with IV diuresis and he was discharged home within only 2 to 3 days. He was referred to the multidisciplinary heart valve clinic and has been evaluated previously by Dr. Burt Knack. TEE and diagnostic cardiac catheterization were performed May 27, 2020. TEE confirmed the presence of moderately decreased left ventricular systolic function with ejection fraction estimated only 30 to 35%. There was moderate right ventricular chamber enlargement with moderately reduced right ventricular function and severe pulmonary hypertension. There was severe aortic stenosis with trivial aortic insufficiency. There was moderate mitral regurgitation and moderate tricuspid regurgitation. Catheterization revealed severe native coronary artery disease with chronic occlusion of the mid left anterior descending coronary artery, the right coronary artery, and multiple branches of the left circumflex system. All 5  previous coronary artery bypass graft remained widely patent without any significant disease.   He was evaluated by the multidisciplinary valve team and underwent successful TAVR  with a 29 mm Edwards Sapien 3 THV via the TF approach on 07/12/20. Post operative echo showed EF 35-40%, normally functioning TAVR with a mean gradient of 9 mmHg and no PVL. Moderate MR/TR. He was resumed on his home Asprin 81 mg daily and renally dosed Eliquis 2.5mg  BID. He has done excellent in follow up and has had a big clinical benefit.    Today he presents to clinic for follow up.   Past Medical History:  Diagnosis Date  . Anemia   . Arthritis   . CAD (coronary artery disease)   . Chronic kidney disease    stage 3 per daughter  . COPD (chronic obstructive pulmonary disease) ([place])   . Diabetes ([place])   . Fibromyalgia    25 years ago.  Marland Kitchen History of CVA (cerebrovascular accident)   . Hx pulmonary embolism   . Hyperlipidemia   . Hypertension   . Myocardial infarction ([place])   . Pneumonia   . Presence of permanent cardiac pacemaker   . Prostate cancer ([place])   . PVOD (pulmonary veno-occlusive disease) ([place])   . S/P CABG x 5 10/19/1997   LIMA to LAD, SVG to ramus, SVG to OM2-OM3, SVG to PDA  . S/P TAVR (transcatheter aortic valve replacement) 07/12/2020   s/p TAVR with a 29 mm Edwards Sapien 3 via the TF approach by Dr. Angelena Form and Dr. Roxy Manns  . Squamous cell carcinoma     Past Surgical History:  Procedure Laterality Date  . BACK SURGERY    . CARDIAC CATHETERIZATION  10/15/1997   Recommend complete revascularization by CABG  . CARDIOVASCULAR STRESS TEST  06/12/2012   No evidence of ischemia  . CAROTID DOPPLER  08/27/2012   Rt bulb/proximal ICA demonstrated a mild-moderate amount of fibrous plaque without evidence of a significant diameter reduction or any other vascular abnormality.  . CAROTID ENDARTERECTOMY Left 02/23/1995  . CORONARY ANGIOPLASTY    . CORONARY ARTERY BYPASS GRAFT  10/19/1997   x5. LIMA-LAD, SVG-PDA, SVG-OM1, seq SVG-fist and second branches of ramus intermedius.  Marland Kitchen HERNIA REPAIR     groin area.  Randolm Idol / REPLACE / REMOVE PACEMAKER    . NO PAST SURGERIES    .  PACEMAKER IMPLANT N/A 02/27/2018   Procedure: PACEMAKER IMPLANT;  Surgeon: Constance Haw, MD;  Location: [facility];  Service: Cardiovascular;  Laterality: N/A;  . RIGHT/LEFT HEART CATH AND CORONARY/GRAFT ANGIOGRAPHY N/A 05/27/2020   Procedure: RIGHT/LEFT HEART CATH AND CORONARY/GRAFT ANGIOGRAPHY;  Surgeon: Sherren Mocha, MD;  Location: [place] [facility];  Service: Cardiovascular;  Laterality: N/A;  . TEE WITHOUT CARDIOVERSION N/A 05/27/2020   Procedure: TRANSESOPHAGEAL ECHOCARDIOGRAM (TEE);  Surgeon: Sanda Klein, MD;  Location: [facility];  Service: Cardiovascular;  Laterality: N/A;  . TEE WITHOUT CARDIOVERSION N/A 07/12/2020   Procedure: TRANSESOPHAGEAL ECHOCARDIOGRAM (TEE);  Surgeon: Burnell Blanks, MD;  Location: [facility] [facility];  Service: Open Heart Surgery;  Laterality: N/A;  . TRANSCATHETER AORTIC VALVE REPLACEMENT, TRANSFEMORAL N/A 07/12/2020   Procedure: TRANSCATHETER AORTIC VALVE REPLACEMENT, TRANSFEMORAL;  Surgeon: Burnell Blanks, MD;  Location: [facility];  Service: Open Heart Surgery;  Laterality: N/A;    Current Medications: Current Meds  Medication Sig  . amiodarone (PACERONE) 200 MG tablet TAKE ONE TABLET BY MOUTH DAILY--please make overdue follow up appt for further refills  [phone] 1st attempt  . apixaban (ELIQUIS) 2.5 MG TABS tablet Take 1 tablet (2.5 mg total) by mouth 2 (two) times daily.  Marland Kitchen aspirin EC 81 MG tablet Take 81 mg by mouth daily.  . cetirizine (ZYRTEC) 10 MG tablet Take 10 mg by mouth daily.  Marland Kitchen ELDERBERRY PO Take 300 mg by mouth daily.  . ferrous sulfate 325 (65 FE) MG tablet Take 325 mg by mouth in the morning and at bedtime.   . furosemide (LASIX) 20 MG tablet Take 1 tablet (20 mg total) by mouth daily.  Marland Kitchen glimepiride (AMARYL) 4 MG tablet Take 1 tablet (4 mg total) by mouth daily with breakfast.  . linagliptin (TRADJENTA) 5 MG TABS tablet Take 1 tablet (5 mg total) by mouth daily.  Marland Kitchen losartan (COZAAR) 25 MG  tablet Take 1 tablet (25 mg total) by mouth daily.  . rosuvastatin (CRESTOR) 10 MG tablet Take 1 tablet (10 mg total) by mouth daily.     Allergies:   Lopressor [metoprolol tartrate] and Lipitor [atorvastatin]   Social History   Socioeconomic History  . Marital status: Married    Spouse name: Jamorris Ndiaye  . Number of children: 1  . Years of education: High school 12 years  . Highest education level: 12th grade  Occupational History  . Occupation: Immunologist    Comment: owns car business  Tobacco Use  . Smoking status: Never Smoker  . Smokeless tobacco: Never Used  Vaping Use  . Vaping Use: Never used  Substance and Sexual Activity  . Alcohol use: No  . Drug use: Never  . Sexual activity: Not Currently  Other Topics Concern  . Not on file  Social History Narrative  . Not on file   Social Determinants of Health   Financial Resource Strain: Not on file  Food Insecurity: Not on file  Transportation Needs: Not on file  Physical Activity: Not on file  Stress: Not on file  Social Connections: Not on file     Family History: The patient's family history includes Cancer in his sister and sister; Diabetes in his brother, brother, brother, and brother; Heart attack in his father.  ROS:   Please see the history of present illness.    All other systems reviewed and are negative.  EKGs/Labs/Other Studies Reviewed:    The following studies were reviewed today:  TAVR OPERATIVE NOTE   Date of Procedure:07/12/2020  Preoperative Diagnosis:Severe Aortic Stenosis   Postoperative Diagnosis:Same   Procedure:   Transcatheter Aortic Valve Replacement - PercutaneousRightTransfemoral Approach Edwards Sapien 3 THV (size 69mm, model # 9600TFX, serial [id number])  Co-Surgeons:Clarence H. Roxy Manns, MD and Lauree Chandler, MD  Anesthesiologist:Stephen Gifford Shave, MD and Belinda Block, MD  Echocardiographer:Peter Johnsie Cancel, MD  Pre-operative Echo Findings: ? Severe aortic stenosis ? Mild-moderateleft ventricular systolic dysfunction  Post-operative Echo Findings: ? Noparavalvular leak ? Unchangedleft ventricular systolic function  _______________________  Echo 07/13/20 IMPRESSIONS  1. Diffuse hypokinesis worse in the septum and inferior base . Left  ventricular ejection fraction, by estimation, is 35 to 40%. The left  ventricle has moderately decreased function. The left ventricle has no  regional wall motion abnormalities. The left  ventricular internal cavity size was mildly dilated. There is moderate  left ventricular hypertrophy. Left ventricular diastolic parameters are  indeterminate.  2. Right ventricular systolic function is normal. The right ventricular  size is normal. There is mildly elevated pulmonary artery systolic  pressure.  3. Left atrial size was moderately dilated.  4. Degree of MR  seen best on image 79 3 chamber view . The mitral valve  is degenerative. Moderate mitral valve regurgitation. No evidence of  mitral stenosis. Moderate mitral annular calcification.  5. Tricuspid valve regurgitation is moderate.  6. Post TAVR with 29 mm Sapien 3 valve no significant PVL mean gradient 9  peak 17 mmHg with AVA 1.7 cm2 and DVI 0.41 . The aortic valve has been  repaired/replaced. Aortic valve regurgitation is not visualized. No aortic  stenosis is present. There is a  29 mm Sapien prosthetic (TAVR) valve present in the aortic position.  Procedure Date: 07/12/2020.  7. Aortic dilatation noted. There is mild dilatation of the ascending  aorta, measuring 41 mm.  8. The inferior vena cava is normal in size with greater than 50%  respiratory variability, suggesting right atrial pressure of 3 mmHg.    _______________________  Echo 08/10/20 IMPRESSIONS  1. Left ventricular ejection fraction, by estimation, is 35 to  40%. The left ventricle has moderately decreased function. The left ventricle demonstrates regional wall motion abnormalities (see scoring diagram/findings for description). The left  ventricular internal cavity size was mildly dilated. There is moderate concentric left ventricular hypertrophy. Left ventricular diastolic parameters are consistent with Grade II diastolic dysfunction (pseudonormalization). There is hypokinesis of the left ventricular, entire septal wall. There is hypokinesis of the left ventricular, basal-mid inferior wall. The average left ventricular global longitudinal strain is -14.6 %. The global longitudinal strain is abnormal.  2. Right ventricular systolic function is normal. The right ventricular size is normal. There is normal pulmonary artery systolic pressure.  3. Left atrial size was moderately dilated.  4. The mitral valve is degenerative. Mild to moderate mitral valve regurgitation. Moderate mitral annular calcification.  5. Tricuspid valve regurgitation is moderate.  6. Trivial perivalvular leak at location of prior RCC. Prior mean gradient 9, AVA 1.7.. The aortic valve has been repaired/replaced. Aortic valve regurgitation is trivial. There is a 29 mm Sapien prosthetic (TAVR) valve present in the aortic position.  Procedure Date: 07/12/20. Aortic valve area, by VTI measures 2.07 cm. Aortic valve mean gradient measures 7.7 mmHg.  7. Aortic dilatation noted. There is mild dilatation of the ascending aorta, measuring 42 mm.  8. The inferior vena cava is normal in size with greater than 50% respiratory variability, suggesting right atrial pressure of 3 mmHg.  Comparison(s): No significant change from prior study. 07/13/20 EF 35-40%. AV 69mmHg mean PG, 53mmHg peak.  EKG:  EKG is NOT ordered today.   Recent Labs: 01/04/2020: TSH 1.060 07/08/2020: ALT 23; B Natriuretic Peptide 673.4 07/13/2020: BUN 31; Creatinine, Ser 1.63; Hemoglobin 10.8; Magnesium 2.0; Platelets 139;  Potassium 4.0; Sodium 137  Recent Lipid Panel No results found for: CHOL, TRIG, HDL, CHOLHDL, VLDL, LDLCALC, LDLDIRECT   Risk Assessment/Calculations:    CHA2DS2-VASc Score = 6  This indicates a 9.7% annual risk of stroke. The patient's score is based upon: CHF History: Yes HTN History: Yes Diabetes History: Yes Stroke History: No Vascular Disease History: Yes Age Score: 2 Gender Score: 0    Physical Exam:    VS:  BP 120/80   Pulse 86   Ht 5\' 4"  (1.626 m)   Wt 124 lb 12.8 oz (56.6 kg)   BMI 21.42 kg/m     Wt Readings from Last 3 Encounters:  08/10/20 124 lb 12.8 oz (56.6 kg)  07/21/20 124 lb (56.2 kg)  07/13/20 114 lb 5.6 oz (51.9 kg)     GEN: Well nourished, well developed in no  acute distress HEENT: Normal NECK: No JVD; No carotid bruits LYMPHATICS: No lymphadenopathy CARDIAC: RRR, no murmurs, rubs, gallops RESPIRATORY:  Clear to auscultation without rales, wheezing or rhonchi  ABDOMEN: Soft, non-tender, non-distended MUSCULOSKELETAL:  No edema; No deformity  SKIN: Warm and dry NEUROLOGIC:  Alert and oriented x 3 PSYCHIATRIC:  Normal affect   ASSESSMENT:    1. S/P TAVR (transcatheter aortic valve replacement)   2. Paroxysmal atrial fibrillation (HCC)   3. S/P CABG x 5   4. Cardiac pacemaker in situ    PLAN:    In order of problems listed above:  Severe ZT:IWPY today shows EF 35-40%, normally functioning TAVR with a mean gradient of 7.7 mm hg and trivial PVL. He has NYHA class I symptoms. Patient has been on chronic Eliquis and aspirin and has done well on this. SBE prophylaxis discussed; the patient is edentulous and does not go to the dentist. I will see him back in 1 year with an echo.   PAF: patient is paced. Continue on long term renally dosed Eliquis.   CAD s/p CABG: pre TAVR cath showed 5/5 patent bypass grafts. Continue medical therapy. No chest pain.   SSS s/p PPM: followed by Dr. [name]  Carotid artery stenosis: known RICA 40-59%  stenosis. Will need yearly follow up of this. Continue medical therapy.    Medication Adjustments/Labs and Tests Ordered: Current medicines are reviewed at length with the patient today.  Concerns regarding medicines are outlined above.  No orders of the defined types were placed in this encounter.  No orders of the defined types were placed in this encounter.   Patient Instructions  Medication Instructions:  Your physician recommends that you continue on your current medications as directed. Please refer to the Current Medication list given to you today.  *If you need a refill on your cardiac medications before your next appointment, please call your pharmacy*  Lab Work: If you have labs (blood work) drawn today and your tests are completely normal, you will receive your results only by: Marland Kitchen MyChart Message (if you have MyChart) OR . A paper copy in the mail If you have any lab test that is abnormal or we need to change your treatment, we will call you to review the results.   Follow-Up: At [facility], you and your health needs are our priority.  As part of our continuing mission to provide you with exceptional heart care, we have created designated Provider Care Teams.  These Care Teams include your primary Cardiologist (physician) and Advanced Practice Providers (APPs -  Physician Assistants and Nurse Practitioners) who all work together to provide you with the care you need, when you need it.  We recommend signing up for the patient portal called "MyChart".  Sign up information is provided on this After Visit Summary.  MyChart is used to connect with patients for Virtual Visits (Telemedicine).  Patients are able to view lab/test results, encounter notes, upcoming appointments, etc.  Non-urgent messages can be sent to your provider as well.   To learn more about what you can do with MyChart, go to [email or web address].    Your next appointment:   3 to 4  month(s)  The format  for your next appointment:   In Person  Provider:   You may see [name], MD or one of the following Advanced Practice Providers on your designated Care Team:    Kerin Ransom, PA-C  [place], [place]  Coletta Memos, [place]  Signed, Angelena Form, PA-C  08/10/2020 4:56 PM    [facility]

## 2020-08-10 ENCOUNTER — Other Ambulatory Visit: Payer: Self-pay

## 2020-08-10 ENCOUNTER — Other Ambulatory Visit (HOSPITAL_COMMUNITY): Payer: Medicare HMO

## 2020-08-10 ENCOUNTER — Ambulatory Visit (HOSPITAL_COMMUNITY): Payer: Medicare HMO | Attending: Cardiology

## 2020-08-10 ENCOUNTER — Ambulatory Visit: Payer: Medicare HMO | Admitting: Physician Assistant

## 2020-08-10 VITALS — BP 120/80 | HR 86 | Ht 64.0 in | Wt 124.8 lb

## 2020-08-10 DIAGNOSIS — Z952 Presence of prosthetic heart valve: Secondary | ICD-10-CM

## 2020-08-10 DIAGNOSIS — L570 Actinic keratosis: Secondary | ICD-10-CM | POA: Diagnosis not present

## 2020-08-10 DIAGNOSIS — I6529 Occlusion and stenosis of unspecified carotid artery: Secondary | ICD-10-CM | POA: Diagnosis not present

## 2020-08-10 DIAGNOSIS — Z951 Presence of aortocoronary bypass graft: Secondary | ICD-10-CM | POA: Diagnosis not present

## 2020-08-10 DIAGNOSIS — I480 Paroxysmal atrial fibrillation: Secondary | ICD-10-CM

## 2020-08-10 DIAGNOSIS — Z950 Presence of cardiac pacemaker: Secondary | ICD-10-CM | POA: Diagnosis not present

## 2020-08-10 DIAGNOSIS — Z85828 Personal history of other malignant neoplasm of skin: Secondary | ICD-10-CM | POA: Diagnosis not present

## 2020-08-10 LAB — ECHOCARDIOGRAM COMPLETE
AR max vel: 1.75 cm2
AV Area VTI: 2.07 cm2
AV Area mean vel: 1.65 cm2
AV Mean grad: 7.7 mmHg
AV Peak grad: 14.5 mmHg
Ao pk vel: 1.9 m/s
Area-P 1/2: 3.77 cm2
MV M vel: 5.17 m/s
MV Peak grad: 106.9 mmHg
Radius: 0.5 cm
S' Lateral: 4 cm

## 2020-08-10 NOTE — Patient Instructions (Signed)
Medication Instructions:  Your physician recommends that you continue on your current medications as directed. Please refer to the Current Medication list given to you today.  *If you need a refill on your cardiac medications before your next appointment, please call your pharmacy*  Lab Work: If you have labs (blood work) drawn today and your tests are completely normal, you will receive your results only by: Marland Kitchen MyChart Message (if you have MyChart) OR . A paper copy in the mail If you have any lab test that is abnormal or we need to change your treatment, we will call you to review the results.   Follow-Up: At [facility], you and your health needs are our priority.  As part of our continuing mission to provide you with exceptional heart care, we have created designated Provider Care Teams.  These Care Teams include your primary Cardiologist (physician) and Advanced Practice Providers (APPs -  Physician Assistants and Nurse Practitioners) who all work together to provide you with the care you need, when you need it.  We recommend signing up for the patient portal called "MyChart".  Sign up information is provided on this After Visit Summary.  MyChart is used to connect with patients for Virtual Visits (Telemedicine).  Patients are able to view lab/test results, encounter notes, upcoming appointments, etc.  Non-urgent messages can be sent to your provider as well.   To learn more about what you can do with MyChart, go to [email or web address].    Your next appointment:   3 to 4  month(s)  The format for your next appointment:   In Person  Provider:   You may see [name], MD or one of the following Advanced Practice Providers on your designated Care Team:    Kerin Ransom, PA-C  [place], [place]  Coletta Memos, [place]

## 2020-08-12 DIAGNOSIS — E1121 Type 2 diabetes mellitus with diabetic nephropathy: Secondary | ICD-10-CM | POA: Diagnosis not present

## 2020-08-12 DIAGNOSIS — N189 Chronic kidney disease, unspecified: Secondary | ICD-10-CM | POA: Diagnosis not present

## 2020-08-12 DIAGNOSIS — I480 Paroxysmal atrial fibrillation: Secondary | ICD-10-CM | POA: Diagnosis not present

## 2020-08-12 DIAGNOSIS — K219 Gastro-esophageal reflux disease without esophagitis: Secondary | ICD-10-CM | POA: Diagnosis not present

## 2020-08-12 DIAGNOSIS — I10 Essential (primary) hypertension: Secondary | ICD-10-CM | POA: Diagnosis not present

## 2020-08-12 DIAGNOSIS — I509 Heart failure, unspecified: Secondary | ICD-10-CM | POA: Diagnosis not present

## 2020-08-12 DIAGNOSIS — I38 Endocarditis, valve unspecified: Secondary | ICD-10-CM | POA: Diagnosis not present

## 2020-08-12 DIAGNOSIS — D539 Nutritional anemia, unspecified: Secondary | ICD-10-CM | POA: Diagnosis not present

## 2020-08-12 DIAGNOSIS — E785 Hyperlipidemia, unspecified: Secondary | ICD-10-CM | POA: Diagnosis not present

## 2020-08-30 ENCOUNTER — Ambulatory Visit (INDEPENDENT_AMBULATORY_CARE_PROVIDER_SITE_OTHER): Payer: Medicare HMO

## 2020-08-30 DIAGNOSIS — I495 Sick sinus syndrome: Secondary | ICD-10-CM | POA: Diagnosis not present

## 2020-08-30 LAB — CUP PACEART REMOTE DEVICE CHECK
Battery Remaining Longevity: 91 mo
Battery Voltage: 3 V
Brady Statistic AP VP Percent: 0.06 %
Brady Statistic AP VS Percent: 99.72 %
Brady Statistic AS VP Percent: 0 %
Brady Statistic AS VS Percent: 0.22 %
Brady Statistic RA Percent Paced: 99.95 %
Brady Statistic RV Percent Paced: 0.06 %
Date Time Interrogation Session: 20220308063940
Implantable Lead Implant Date: 20190905
Implantable Lead Implant Date: 20190905
Implantable Lead Location: 753859
Implantable Lead Location: 753860
Implantable Lead Model: 3830
Implantable Lead Model: 5076
Implantable Pulse Generator Implant Date: 20190905
Lead Channel Impedance Value: 266 Ohm
Lead Channel Impedance Value: 304 Ohm
Lead Channel Impedance Value: 361 Ohm
Lead Channel Impedance Value: 380 Ohm
Lead Channel Pacing Threshold Amplitude: 0.75 V
Lead Channel Pacing Threshold Amplitude: 1.25 V
Lead Channel Pacing Threshold Pulse Width: 0.4 ms
Lead Channel Pacing Threshold Pulse Width: 0.4 ms
Lead Channel Sensing Intrinsic Amplitude: 1.625 mV
Lead Channel Sensing Intrinsic Amplitude: 1.625 mV
Lead Channel Sensing Intrinsic Amplitude: 7.375 mV
Lead Channel Sensing Intrinsic Amplitude: 7.375 mV
Lead Channel Setting Pacing Amplitude: 1.75 V
Lead Channel Setting Pacing Amplitude: 2.5 V
Lead Channel Setting Pacing Pulse Width: 0.8 ms
Lead Channel Setting Sensing Sensitivity: 1.2 mV

## 2020-09-07 NOTE — Progress Notes (Signed)
Remote pacemaker transmission.   

## 2020-10-25 ENCOUNTER — Other Ambulatory Visit: Payer: Self-pay

## 2020-10-25 ENCOUNTER — Encounter: Payer: Self-pay | Admitting: Cardiovascular Disease

## 2020-10-25 ENCOUNTER — Ambulatory Visit: Payer: Medicare HMO | Admitting: Cardiovascular Disease

## 2020-10-25 VITALS — BP 140/62 | HR 67 | Ht 65.0 in | Wt 128.4 lb

## 2020-10-25 DIAGNOSIS — I480 Paroxysmal atrial fibrillation: Secondary | ICD-10-CM

## 2020-10-25 DIAGNOSIS — Z951 Presence of aortocoronary bypass graft: Secondary | ICD-10-CM

## 2020-10-25 DIAGNOSIS — I6521 Occlusion and stenosis of right carotid artery: Secondary | ICD-10-CM

## 2020-10-25 DIAGNOSIS — Z952 Presence of prosthetic heart valve: Secondary | ICD-10-CM

## 2020-10-25 DIAGNOSIS — I10 Essential (primary) hypertension: Secondary | ICD-10-CM

## 2020-10-25 NOTE — Assessment & Plan Note (Signed)
History of essential hypertension a blood pressure measured today at 140/62.  He is on losartan.

## 2020-10-25 NOTE — Assessment & Plan Note (Signed)
History of PAF maintaining sinus rhythm on amiodarone and Eliquis oral anticoagulation.  He has had a permanent transvenous pacemaker placed by Dr. [name].

## 2020-10-25 NOTE — Progress Notes (Signed)
10/25/2020 Bryan Carney   October 18, 1940  [id number]  Primary Physician Bryan Dandy, NP Primary Cardiologist: Bryan Harp MD Bryan Carney, [place]  HPI:  Bryan Carney is a 80 y.o.  thin-appearing married Caucasian male father of one daughter, grandfather of 2 grandchildren who is accompanied by hisdaughter [name].I last saw him in the office  04/15/18... he was admitted to the hospital in [place] chest wall hematoma and acute blood loss anemia. He did have aspiration pneumonia, PAF and altered mental status. He was in the hospital for 18 days but has since recuperated. His Coumadin was changed to Eliquis. He is currently asymptomatic although he has some back pain walks with a cane apparently needs a back injection by Dr. Herma Carney. 05/09/2016. He worked at a [employer] in [place]. He has a history of CAD and PAD. He had coronary artery bypass grafting April 1999 and left carotid endarterectomy by Dr. Kellie Carney 02/23/95. His problems include treated hypertension and hyperlipidemia. He did have a remote pulmonary embolus on Coumadin anticoagulation. His last Myoview stress test performed 06/12/12 was normal.  He was hospitalized October 2018 or chest wall hematoma, acute blood loss anemia complications that included aspiration pneumonia, altered mental status and PAF. His Coumadin was transitioned to Eliquis. Since that time he has been relatively asymptomatic. He does have back pain andrecently had a back injection by Dr. Nelva Carney.   He had  a permanent transvenous pacemaker placed by Dr. Curt Carney at my request for tachybradycardia syndrome which he was symptomatic from.  He is markedly clinically improved.  He remains on oral anticoagulation.    Since I saw him 2-1/2 years ago he did undergo right left heart cath by Dr. Burt Carney in preparation for TAVR 05/27/2020 revealing occluded native vessels with patent grafts.  No family underwent TAVR by Dr. Burt Carney 07/12/2020 and  was discharged the following day.  He was back at work 2 days later.  He still works 6 days a week in his car a lot.  He denies chest pain or shortness of breath.  His most recent 2D echo performed 08/10/2020 revealed an EF of 35 to 40% with a well-functioning aortic bioprosthesis.  Current Meds  Medication Sig  . amiodarone (PACERONE) 200 MG tablet TAKE ONE TABLET BY MOUTH DAILY--please make overdue follow up appt for further refills [id number] 1st attempt  . apixaban (ELIQUIS) 2.5 MG TABS tablet Take 1 tablet (2.5 mg total) by mouth 2 (two) times daily.  Marland Kitchen aspirin EC 81 MG tablet Take 81 mg by mouth daily.  . cetirizine (ZYRTEC) 10 MG tablet Take 10 mg by mouth daily.  Marland Kitchen ELDERBERRY PO Take 300 mg by mouth daily.  . ferrous sulfate 325 (65 FE) MG tablet Take 325 mg by mouth in the morning and at bedtime.   . furosemide (LASIX) 20 MG tablet Take 1 tablet (20 mg total) by mouth daily.  Marland Kitchen glimepiride (AMARYL) 4 MG tablet Take 1 tablet (4 mg total) by mouth daily with breakfast.  . linagliptin (TRADJENTA) 5 MG TABS tablet Take 1 tablet (5 mg total) by mouth daily.  Marland Kitchen losartan (COZAAR) 25 MG tablet Take 1 tablet (25 mg total) by mouth daily.  . rosuvastatin (CRESTOR) 10 MG tablet Take 1 tablet (10 mg total) by mouth daily.     Allergies  Allergen Reactions  . Lopressor [Metoprolol Tartrate] Other (See Comments)    Severe BP drop  . Lipitor [Atorvastatin]     Muscle aches  Social History   Socioeconomic History  . Marital status: Married    Spouse name: Bryan Carney  . Number of children: 1  . Years of education: High school 12 years  . Highest education level: 12th grade  Occupational History  . Occupation: Immunologist    Comment: owns car business  Tobacco Use  . Smoking status: Never Smoker  . Smokeless tobacco: Never Used  Vaping Use  . Vaping Use: Never used  Substance and Sexual Activity  . Alcohol use: No  . Drug use: Never  . Sexual activity: Not Currently  Other  Topics Concern  . Not on file  Social History Narrative  . Not on file   Social Determinants of Health   Financial Resource Strain: Not on file  Food Insecurity: Not on file  Transportation Needs: Not on file  Physical Activity: Not on file  Stress: Not on file  Social Connections: Not on file  Intimate Partner Violence: Not on file     Review of Systems: General: negative for chills, fever, night sweats or weight changes.  Cardiovascular: negative for chest pain, dyspnea on exertion, edema, orthopnea, palpitations, paroxysmal nocturnal dyspnea or shortness of breath Dermatological: negative for rash Respiratory: negative for cough or wheezing Urologic: negative for hematuria Abdominal: negative for nausea, vomiting, diarrhea, bright red blood per rectum, melena, or hematemesis Neurologic: negative for visual changes, syncope, or dizziness All other systems reviewed and are otherwise negative except as noted above.    Blood pressure 140/62, pulse 67, height 5\' 5"  (1.651 m), weight 128 lb 6.4 oz (58.2 kg).  General appearance: alert and no distress Neck: no adenopathy, no JVD, supple, symmetrical, trachea midline, thyroid not enlarged, symmetric, no tenderness/mass/nodules and Soft bilateral carotid bruits Lungs: clear to auscultation bilaterally Heart: regular rate and rhythm, S1, S2 normal, no murmur, click, rub or gallop Extremities: extremities normal, atraumatic, no cyanosis or edema Pulses: 2+ and symmetric Skin: Skin color, texture, turgor normal. No rashes or lesions Neurologic: Alert and oriented X 3, normal strength and tone. Normal symmetric reflexes. Normal coordination and gait  EKG atrially paced rhythm at 67 with nonspecific ST and T wave changes and nonspecific IVCD.  I personally reviewed this EKG.  ASSESSMENT AND PLAN:   Essential hypertension History of essential hypertension a blood pressure measured today at 140/62.  He is on  losartan.  Hyperlipidemia History of hyperlipidemia on statin therapy with lipid profile performed 05/06/2020 revealing a total cholesterol of 93, LDL 41 and HDL 39.  Carotid artery disease ([place]) History of moderate right ICA stenosis by duplex ultrasound 06/10/2020.  This will be repeated this December.  He is status post left carotid endarterectomy by Dr. Kellie Carney 02/23/1995.  Paroxysmal atrial fibrillation ([facility]) History of PAF maintaining sinus rhythm on amiodarone and Eliquis oral anticoagulation.  He has had a permanent transvenous pacemaker placed by Dr. Curt Carney.  S/P CABG x 5 History of CAD status post CABG in 1999.  He underwent right left heart cath by Dr. Burt Carney in anticipation of TAVR 05/27/2020 revealing occluded RCA, LAD and obtuse marginal branch vessels with patent grafts.  S/P TAVR (transcatheter aortic valve replacement) Status post TAVR performed Dr. Burt Carney 07/12/2020 for critical aortic stenosis.  He was discharged home the following day and 2 days later was back to work.  His most recent 2D echo performed 08/10/2020 revealed an EF of 35 to 40% with grade 2 diastolic dysfunction and a well-functioning aortic bioprosthesis.  This will be repeated on an annual  basis.  He did have mild to moderate MR as well.      Bryan Harp MD [facility] 10/25/2020 3:34 PM

## 2020-10-25 NOTE — Assessment & Plan Note (Signed)
Status post TAVR performed Dr. Burt Knack 07/12/2020 for critical aortic stenosis.  He was discharged home the following day and 2 days later was back to work.  His most recent 2D echo performed 08/10/2020 revealed an EF of 35 to 40% with grade 2 diastolic dysfunction and a well-functioning aortic bioprosthesis.  This will be repeated on an annual basis.  He did have mild to moderate MR as well.

## 2020-10-25 NOTE — Assessment & Plan Note (Signed)
History of hyperlipidemia on statin therapy with lipid profile performed 05/06/2020 revealing a total cholesterol of 93, LDL 41 and HDL 39.

## 2020-10-25 NOTE — Assessment & Plan Note (Signed)
History of CAD status post CABG in 1999.  He underwent right left heart cath by Dr. Burt Knack in anticipation of TAVR 05/27/2020 revealing occluded RCA, LAD and obtuse marginal branch vessels with patent grafts.

## 2020-10-25 NOTE — Assessment & Plan Note (Addendum)
History of moderate right ICA stenosis by duplex ultrasound 06/10/2020.  This will be repeated this December.  He is status post left carotid endarterectomy by Dr. Kellie Simmering 02/23/1995.

## 2020-10-25 NOTE — Patient Instructions (Signed)
Medication Instructions:  No Changes In Medications at this time.  *If you need a refill on your cardiac medications before your next appointment, please call your pharmacy*  Testing/Procedures: Your physician has requested that you have a carotid duplex. THIS NEEDS TO BE DONE IN December OF THIS YEAR. This test is an ultrasound of the carotid arteries in your neck. It looks at blood flow through these arteries that supply the brain with blood. Allow one hour for this exam. There are no restrictions or special instructions.  Your physician has requested that you have an echocardiogram. THIS NEEDS TO BE DONE IN FEBRUARY OF NEXT YEAR. Echocardiography is a painless test that uses sound waves to create images of your heart. It provides your doctor with information about the size and shape of your heart and how well your heart's chambers and valves are working. You may receive an ultrasound enhancing agent through an IV if needed to better visualize your heart during the echo.This procedure takes approximately one hour. There are no restrictions for this procedure. This will take place at the [address].   Follow-Up: At [facility], you and your health needs are our priority.  As part of our continuing mission to provide you with exceptional heart care, we have created designated Provider Care Teams.  These Care Teams include your primary Cardiologist (physician) and Advanced Practice Providers (APPs -  Physician Assistants and Nurse Practitioners) who all work together to provide you with the care you need, when you need it.  Your next appointment:   12 month(s)  The format for your next appointment:   In Person  Provider:   [name], MD

## 2020-11-10 ENCOUNTER — Other Ambulatory Visit: Payer: Self-pay | Admitting: Cardiology

## 2020-11-10 DIAGNOSIS — E1121 Type 2 diabetes mellitus with diabetic nephropathy: Secondary | ICD-10-CM | POA: Diagnosis not present

## 2020-11-10 DIAGNOSIS — E785 Hyperlipidemia, unspecified: Secondary | ICD-10-CM | POA: Diagnosis not present

## 2020-11-10 DIAGNOSIS — D539 Nutritional anemia, unspecified: Secondary | ICD-10-CM | POA: Diagnosis not present

## 2020-11-10 DIAGNOSIS — N189 Chronic kidney disease, unspecified: Secondary | ICD-10-CM | POA: Diagnosis not present

## 2020-11-10 DIAGNOSIS — I38 Endocarditis, valve unspecified: Secondary | ICD-10-CM | POA: Diagnosis not present

## 2020-11-10 DIAGNOSIS — I509 Heart failure, unspecified: Secondary | ICD-10-CM | POA: Diagnosis not present

## 2020-11-10 DIAGNOSIS — I480 Paroxysmal atrial fibrillation: Secondary | ICD-10-CM | POA: Diagnosis not present

## 2020-11-10 DIAGNOSIS — I10 Essential (primary) hypertension: Secondary | ICD-10-CM | POA: Diagnosis not present

## 2020-11-10 DIAGNOSIS — K219 Gastro-esophageal reflux disease without esophagitis: Secondary | ICD-10-CM | POA: Diagnosis not present

## 2020-11-29 LAB — CUP PACEART REMOTE DEVICE CHECK
Battery Remaining Longevity: 90 mo
Battery Voltage: 2.99 V
Brady Statistic AP VP Percent: 0.05 %
Brady Statistic AP VS Percent: 99.87 %
Brady Statistic AS VP Percent: 0 %
Brady Statistic AS VS Percent: 0.07 %
Brady Statistic RA Percent Paced: 99.95 %
Brady Statistic RV Percent Paced: 0.06 %
Date Time Interrogation Session: 20220607063207
Implantable Lead Implant Date: 20190905
Implantable Lead Implant Date: 20190905
Implantable Lead Location: 753859
Implantable Lead Location: 753860
Implantable Lead Model: 3830
Implantable Lead Model: 5076
Implantable Pulse Generator Implant Date: 20190905
Lead Channel Impedance Value: 266 Ohm
Lead Channel Impedance Value: 266 Ohm
Lead Channel Impedance Value: 361 Ohm
Lead Channel Impedance Value: 361 Ohm
Lead Channel Pacing Threshold Amplitude: 0.875 V
Lead Channel Pacing Threshold Amplitude: 1.375 V
Lead Channel Pacing Threshold Pulse Width: 0.4 ms
Lead Channel Pacing Threshold Pulse Width: 0.4 ms
Lead Channel Sensing Intrinsic Amplitude: 2 mV
Lead Channel Sensing Intrinsic Amplitude: 2 mV
Lead Channel Sensing Intrinsic Amplitude: 6.375 mV
Lead Channel Sensing Intrinsic Amplitude: 6.375 mV
Lead Channel Setting Pacing Amplitude: 1.75 V
Lead Channel Setting Pacing Amplitude: 2.5 V
Lead Channel Setting Pacing Pulse Width: 0.8 ms
Lead Channel Setting Sensing Sensitivity: 1.2 mV

## 2020-12-05 DIAGNOSIS — I10 Essential (primary) hypertension: Secondary | ICD-10-CM | POA: Diagnosis not present

## 2020-12-05 DIAGNOSIS — S63501A Unspecified sprain of right wrist, initial encounter: Secondary | ICD-10-CM | POA: Diagnosis not present

## 2020-12-05 DIAGNOSIS — M25539 Pain in unspecified wrist: Secondary | ICD-10-CM | POA: Diagnosis not present

## 2020-12-05 DIAGNOSIS — Z86711 Personal history of pulmonary embolism: Secondary | ICD-10-CM | POA: Diagnosis not present

## 2020-12-05 DIAGNOSIS — Z950 Presence of cardiac pacemaker: Secondary | ICD-10-CM | POA: Diagnosis not present

## 2020-12-05 DIAGNOSIS — Z951 Presence of aortocoronary bypass graft: Secondary | ICD-10-CM | POA: Diagnosis not present

## 2020-12-05 DIAGNOSIS — E7800 Pure hypercholesterolemia, unspecified: Secondary | ICD-10-CM | POA: Diagnosis not present

## 2020-12-05 DIAGNOSIS — M25531 Pain in right wrist: Secondary | ICD-10-CM | POA: Diagnosis not present

## 2020-12-05 DIAGNOSIS — I252 Old myocardial infarction: Secondary | ICD-10-CM | POA: Diagnosis not present

## 2020-12-05 DIAGNOSIS — M1990 Unspecified osteoarthritis, unspecified site: Secondary | ICD-10-CM | POA: Diagnosis not present

## 2020-12-05 DIAGNOSIS — Z8673 Personal history of transient ischemic attack (TIA), and cerebral infarction without residual deficits: Secondary | ICD-10-CM | POA: Diagnosis not present

## 2020-12-06 DIAGNOSIS — M25531 Pain in right wrist: Secondary | ICD-10-CM | POA: Diagnosis not present

## 2021-02-08 DIAGNOSIS — L570 Actinic keratosis: Secondary | ICD-10-CM | POA: Diagnosis not present

## 2021-02-08 DIAGNOSIS — D0439 Carcinoma in situ of skin of other parts of face: Secondary | ICD-10-CM | POA: Diagnosis not present

## 2021-02-08 DIAGNOSIS — L821 Other seborrheic keratosis: Secondary | ICD-10-CM | POA: Diagnosis not present

## 2021-02-08 DIAGNOSIS — C4442 Squamous cell carcinoma of skin of scalp and neck: Secondary | ICD-10-CM | POA: Diagnosis not present

## 2021-02-08 DIAGNOSIS — Z85828 Personal history of other malignant neoplasm of skin: Secondary | ICD-10-CM | POA: Diagnosis not present

## 2021-02-21 ENCOUNTER — Telehealth: Payer: Self-pay | Admitting: Cardiovascular Disease

## 2021-02-21 NOTE — Telephone Encounter (Signed)
Pharmacy, can you please comment on how long Eliquis can be held from upcoming Mohs procedure?  Thank you!

## 2021-02-21 NOTE — Telephone Encounter (Signed)
   [facility] Pre-operative Risk Assessment    Request for surgical clearance:  What type of surgery is being performed? Mohs Surgery removing a skin cancer from scalp  When is this surgery scheduled?  03/01/2021 @ 8:30 am  What type of clearance is required (medical clearance vs. Pharmacy clearance to hold med vs. Both)? Pharmacy  Are there any medications that need to be held prior to surgery and how long? Eliquis a few days before and after procedure  Practice name and name of physician performing surgery? [facility] Dr. Griselda Miner  What is your office phone number [phone]   7.   What is your office fax number [phone]  8.   Anesthesia type (None, local, MAC, general) ?  [place] 02/21/2021, 4:53 PM  _________________________________________________________________   (provider comments below)

## 2021-02-22 NOTE — Telephone Encounter (Addendum)
   Name: Bryan Carney  DOB: 1941-02-26  MRN: [id number]   Primary Cardiologist: [name], MD  Chart reviewed as part of pre-operative protocol coverage. We have been asked for guidance to hold eliquis.   Per our clinical pharmacist: Pt is at elevated CV risk off of anticoagulation given high CHADS2VASc score and hx of CVA. Recommend limiting time off anticoag as much as possible. Would prefer 1 day Eliquis hold pre-procedure given elevated risk, should be a lower bleed risk derm procedure. If longer hold is required, will need cardiologist clearance.  Per Dr. [name],  [place] to hold ASA and eliquis the day before and day of the procedure. Restart both as soon as possible after procedure.   I will route this recommendation to the requesting party via Epic fax function and remove from pre-op pool. Please call with questions.  Ledora Bottcher, PA 02/22/2021, 4:47 PM

## 2021-02-22 NOTE — Telephone Encounter (Signed)
Patient with diagnosis of afib on Eliquis for anticoagulation.    Procedure: Mohs surgery removing skin cancer from scalp Date of procedure: 03/01/21  CHA2DS2-VASc Score = 8  This indicates a 10.8% annual risk of stroke. The patient's score is based upon: CHF History: Yes HTN History: Yes Diabetes History: Yes Stroke History: Yes Vascular Disease History: Yes Age Score: 2 Gender Score: 0  Also has hx of remote PE on Coumadin  CrCl 46m/min Platelet count 139K  Pt is at elevated CV risk off of anticoagulation given high CHADS2VASc score and hx of CVA. Recommend limiting time off anticoag as much as possible. Would prefer 1 day Eliquis hold pre-procedure given elevated risk, should be a lower bleed risk derm procedure. If longer hold is required, will need cardiologist clearance.

## 2021-02-23 DIAGNOSIS — I480 Paroxysmal atrial fibrillation: Secondary | ICD-10-CM | POA: Diagnosis not present

## 2021-02-23 DIAGNOSIS — I10 Essential (primary) hypertension: Secondary | ICD-10-CM | POA: Diagnosis not present

## 2021-02-23 DIAGNOSIS — K219 Gastro-esophageal reflux disease without esophagitis: Secondary | ICD-10-CM | POA: Diagnosis not present

## 2021-02-23 DIAGNOSIS — I38 Endocarditis, valve unspecified: Secondary | ICD-10-CM | POA: Diagnosis not present

## 2021-02-23 DIAGNOSIS — E785 Hyperlipidemia, unspecified: Secondary | ICD-10-CM | POA: Diagnosis not present

## 2021-02-23 DIAGNOSIS — N189 Chronic kidney disease, unspecified: Secondary | ICD-10-CM | POA: Diagnosis not present

## 2021-02-23 DIAGNOSIS — I509 Heart failure, unspecified: Secondary | ICD-10-CM | POA: Diagnosis not present

## 2021-02-23 DIAGNOSIS — E1121 Type 2 diabetes mellitus with diabetic nephropathy: Secondary | ICD-10-CM | POA: Diagnosis not present

## 2021-02-23 DIAGNOSIS — D539 Nutritional anemia, unspecified: Secondary | ICD-10-CM | POA: Diagnosis not present

## 2021-02-23 NOTE — Telephone Encounter (Signed)
Hi Dr. [name], can you please look at the note from Dermatology office below? They are wanting to hold Eliquis for 3 days (day before, day of, and day after) procedure and Aspirin the day of and day after procedure given significant bleeding the last time he came in for a biopsy. Is this OK with you?  Please route response back to P CV DIV PREOP.  Thank you!

## 2021-02-23 NOTE — Telephone Encounter (Signed)
Crystal is calling from [facility] stating Dr. Sarajane Jews is requesting a longer hold than what was advised for Eliquis. She states Dr. Sarajane Jews would like to request a 3 day hold for both Eliquis and Aspirin, the day before, day of, and the day after. They are requesting this due to Dent having excessive bleeding when he came in for a biopsy for this procedure. She states the biopsy was performed on 02/08/21. He was stitched up after the biopsy was performed and had on and off bleeding at home to the point of bleeding through bandages when applying pressure to get the bleeding to stop. When Joe came in for suture removal on 02/16/21 they had to restitch him again due to bleeding starting. Due to all of this they do not believe holding Eliquis for one day will work. They would like a callback to discuss as well as a refax of the new clearance. Please advise.

## 2021-02-23 NOTE — Telephone Encounter (Signed)
Crystal from [facility] called back and stated to ONLY hold the aspirin the day of and the day after NOT the day before the patients procedure

## 2021-02-24 NOTE — Telephone Encounter (Signed)
Bryan Carney w/ [facility] is following up in regards to holding Eliquis and Asprin... please advise

## 2021-02-28 ENCOUNTER — Ambulatory Visit (INDEPENDENT_AMBULATORY_CARE_PROVIDER_SITE_OTHER): Payer: Medicare HMO

## 2021-02-28 DIAGNOSIS — I495 Sick sinus syndrome: Secondary | ICD-10-CM | POA: Diagnosis not present

## 2021-02-28 LAB — CUP PACEART REMOTE DEVICE CHECK
Battery Remaining Longevity: 94 mo
Battery Voltage: 2.99 V
Brady Statistic AP VP Percent: 0.07 %
Brady Statistic AP VS Percent: 99.79 %
Brady Statistic AS VP Percent: 0 %
Brady Statistic AS VS Percent: 0.13 %
Brady Statistic RA Percent Paced: 99.9 %
Brady Statistic RV Percent Paced: 0.07 %
Date Time Interrogation Session: 20220906062659
Implantable Lead Implant Date: 20190905
Implantable Lead Implant Date: 20190905
Implantable Lead Location: 753859
Implantable Lead Location: 753860
Implantable Lead Model: 3830
Implantable Lead Model: 5076
Implantable Pulse Generator Implant Date: 20190905
Lead Channel Impedance Value: 266 Ohm
Lead Channel Impedance Value: 285 Ohm
Lead Channel Impedance Value: 380 Ohm
Lead Channel Impedance Value: 380 Ohm
Lead Channel Pacing Threshold Amplitude: 0.75 V
Lead Channel Pacing Threshold Amplitude: 1.25 V
Lead Channel Pacing Threshold Pulse Width: 0.4 ms
Lead Channel Pacing Threshold Pulse Width: 0.4 ms
Lead Channel Sensing Intrinsic Amplitude: 1.5 mV
Lead Channel Sensing Intrinsic Amplitude: 1.5 mV
Lead Channel Sensing Intrinsic Amplitude: 6.625 mV
Lead Channel Sensing Intrinsic Amplitude: 6.625 mV
Lead Channel Setting Pacing Amplitude: 1.5 V
Lead Channel Setting Pacing Amplitude: 2.5 V
Lead Channel Setting Pacing Pulse Width: 0.8 ms
Lead Channel Setting Sensing Sensitivity: 1.2 mV

## 2021-03-01 DIAGNOSIS — C4442 Squamous cell carcinoma of skin of scalp and neck: Secondary | ICD-10-CM | POA: Diagnosis not present

## 2021-03-01 DIAGNOSIS — Z85828 Personal history of other malignant neoplasm of skin: Secondary | ICD-10-CM | POA: Diagnosis not present

## 2021-03-08 NOTE — Progress Notes (Signed)
Remote pacemaker transmission.   

## 2021-04-11 ENCOUNTER — Other Ambulatory Visit: Payer: Self-pay | Admitting: Physician Assistant

## 2021-05-30 ENCOUNTER — Ambulatory Visit (INDEPENDENT_AMBULATORY_CARE_PROVIDER_SITE_OTHER): Payer: Medicare HMO

## 2021-05-30 DIAGNOSIS — I495 Sick sinus syndrome: Secondary | ICD-10-CM | POA: Diagnosis not present

## 2021-05-30 LAB — CUP PACEART REMOTE DEVICE CHECK
Battery Remaining Longevity: 92 mo
Battery Voltage: 2.99 V
Brady Statistic AP VP Percent: 0.12 %
Brady Statistic AP VS Percent: 99.44 %
Brady Statistic AS VP Percent: 0 %
Brady Statistic AS VS Percent: 0.45 %
Brady Statistic RA Percent Paced: 99.95 %
Brady Statistic RV Percent Paced: 0.12 %
Date Time Interrogation Session: 20221206062804
Implantable Lead Implant Date: 20190905
Implantable Lead Implant Date: 20190905
Implantable Lead Location: 753859
Implantable Lead Location: 753860
Implantable Lead Model: 3830
Implantable Lead Model: 5076
Implantable Pulse Generator Implant Date: 20190905
Lead Channel Impedance Value: 266 Ohm
Lead Channel Impedance Value: 266 Ohm
Lead Channel Impedance Value: 361 Ohm
Lead Channel Impedance Value: 361 Ohm
Lead Channel Pacing Threshold Amplitude: 0.75 V
Lead Channel Pacing Threshold Amplitude: 1.5 V
Lead Channel Pacing Threshold Pulse Width: 0.4 ms
Lead Channel Pacing Threshold Pulse Width: 0.4 ms
Lead Channel Sensing Intrinsic Amplitude: 1.375 mV
Lead Channel Sensing Intrinsic Amplitude: 1.375 mV
Lead Channel Sensing Intrinsic Amplitude: 6.625 mV
Lead Channel Sensing Intrinsic Amplitude: 6.625 mV
Lead Channel Setting Pacing Amplitude: 1.5 V
Lead Channel Setting Pacing Amplitude: 2.5 V
Lead Channel Setting Pacing Pulse Width: 0.8 ms
Lead Channel Setting Sensing Sensitivity: 1.2 mV

## 2021-05-31 DIAGNOSIS — Z85828 Personal history of other malignant neoplasm of skin: Secondary | ICD-10-CM | POA: Diagnosis not present

## 2021-05-31 DIAGNOSIS — L570 Actinic keratosis: Secondary | ICD-10-CM | POA: Diagnosis not present

## 2021-05-31 DIAGNOSIS — C4441 Basal cell carcinoma of skin of scalp and neck: Secondary | ICD-10-CM | POA: Diagnosis not present

## 2021-05-31 DIAGNOSIS — D485 Neoplasm of uncertain behavior of skin: Secondary | ICD-10-CM | POA: Diagnosis not present

## 2021-06-07 ENCOUNTER — Other Ambulatory Visit: Payer: Self-pay

## 2021-06-07 ENCOUNTER — Ambulatory Visit (INDEPENDENT_AMBULATORY_CARE_PROVIDER_SITE_OTHER): Payer: Medicare HMO

## 2021-06-07 DIAGNOSIS — I6521 Occlusion and stenosis of right carotid artery: Secondary | ICD-10-CM

## 2021-06-07 DIAGNOSIS — Z952 Presence of prosthetic heart valve: Secondary | ICD-10-CM

## 2021-06-07 DIAGNOSIS — I10 Essential (primary) hypertension: Secondary | ICD-10-CM

## 2021-06-07 NOTE — Progress Notes (Signed)
Remote pacemaker transmission.   

## 2021-06-08 DIAGNOSIS — E785 Hyperlipidemia, unspecified: Secondary | ICD-10-CM | POA: Diagnosis not present

## 2021-06-08 DIAGNOSIS — D539 Nutritional anemia, unspecified: Secondary | ICD-10-CM | POA: Diagnosis not present

## 2021-06-08 DIAGNOSIS — E1121 Type 2 diabetes mellitus with diabetic nephropathy: Secondary | ICD-10-CM | POA: Diagnosis not present

## 2021-06-08 DIAGNOSIS — I10 Essential (primary) hypertension: Secondary | ICD-10-CM | POA: Diagnosis not present

## 2021-06-12 DIAGNOSIS — E785 Hyperlipidemia, unspecified: Secondary | ICD-10-CM | POA: Diagnosis not present

## 2021-06-12 DIAGNOSIS — N189 Chronic kidney disease, unspecified: Secondary | ICD-10-CM | POA: Diagnosis not present

## 2021-06-12 DIAGNOSIS — D539 Nutritional anemia, unspecified: Secondary | ICD-10-CM | POA: Diagnosis not present

## 2021-06-12 DIAGNOSIS — K219 Gastro-esophageal reflux disease without esophagitis: Secondary | ICD-10-CM | POA: Diagnosis not present

## 2021-06-12 DIAGNOSIS — I480 Paroxysmal atrial fibrillation: Secondary | ICD-10-CM | POA: Diagnosis not present

## 2021-06-12 DIAGNOSIS — I509 Heart failure, unspecified: Secondary | ICD-10-CM | POA: Diagnosis not present

## 2021-06-12 DIAGNOSIS — I10 Essential (primary) hypertension: Secondary | ICD-10-CM | POA: Diagnosis not present

## 2021-06-12 DIAGNOSIS — E1121 Type 2 diabetes mellitus with diabetic nephropathy: Secondary | ICD-10-CM | POA: Diagnosis not present

## 2021-06-12 DIAGNOSIS — I38 Endocarditis, valve unspecified: Secondary | ICD-10-CM | POA: Diagnosis not present

## 2021-07-05 ENCOUNTER — Other Ambulatory Visit: Payer: Self-pay

## 2021-07-05 ENCOUNTER — Ambulatory Visit (INDEPENDENT_AMBULATORY_CARE_PROVIDER_SITE_OTHER): Payer: Medicare HMO | Admitting: Physician Assistant

## 2021-07-05 ENCOUNTER — Encounter: Payer: Self-pay | Admitting: Physician Assistant

## 2021-07-05 ENCOUNTER — Ambulatory Visit (HOSPITAL_COMMUNITY): Payer: Medicare HMO | Attending: Cardiology

## 2021-07-05 VITALS — BP 130/62 | HR 77 | Ht 65.0 in | Wt 127.0 lb

## 2021-07-05 DIAGNOSIS — Z951 Presence of aortocoronary bypass graft: Secondary | ICD-10-CM | POA: Diagnosis not present

## 2021-07-05 DIAGNOSIS — Z950 Presence of cardiac pacemaker: Secondary | ICD-10-CM

## 2021-07-05 DIAGNOSIS — Z952 Presence of prosthetic heart valve: Secondary | ICD-10-CM | POA: Diagnosis not present

## 2021-07-05 DIAGNOSIS — I480 Paroxysmal atrial fibrillation: Secondary | ICD-10-CM | POA: Diagnosis not present

## 2021-07-05 DIAGNOSIS — J449 Chronic obstructive pulmonary disease, unspecified: Secondary | ICD-10-CM | POA: Diagnosis not present

## 2021-07-05 DIAGNOSIS — I6521 Occlusion and stenosis of right carotid artery: Secondary | ICD-10-CM

## 2021-07-05 LAB — ECHOCARDIOGRAM COMPLETE
AR max vel: 1.6 cm2
AV Area VTI: 1.62 cm2
AV Area mean vel: 1.27 cm2
AV Mean grad: 6 mmHg
AV Peak grad: 11 mmHg
Ao pk vel: 1.66 m/s
Area-P 1/2: 2.83 cm2
S' Lateral: 3.5 cm

## 2021-07-05 NOTE — Progress Notes (Signed)
[facility] [place]                                     Cardiology Office Note:    Date:  07/06/2021   ID:  CASHIUS GRANDSTAFF, DOB 21-Dec-1940, MRN [id number]  PCP:  [name], NP  [facility] Cardiologist:  [name], MD / Dr. Angelena Form and Dr. Roxy Manns (TAVR) [facility] Electrophysiologist:  Will Meredith Leeds, MD   Referring MD: [name], NP   CC: 1 year s/p TAVR  History of Present Illness:    Bryan Carney is a 81 y.o. male with a hx of CAD s/p CABG, CVA in setting of carotid artery stenosis s/p CEA, DMT2, PE, PAF on chronic anticoagulation, anemia, CKD stage III, COPD, SSS s/p PPM, HTN, HLD, chronic combined systolic HF and moderate MR/TR as well as severe AS s/p TAVR (07/12/20) who presents to clinic for follow up.   Patient's cardiac history dates back to 1996 when he initially presented with a stroke.  He subsequently underwent left carotid endarterectomy and was left with no residual deficits. He developed coronary artery disease and underwent coronary artery bypass grafting x5 in 1999 by Dr. [name].  He has been followed carefully ever since by Dr. [name].  In 2019 he was found to have episodes of paroxysmal atrial fibrillation with sick sinus syndrome and sinus pauses on routine monitor and subsequently underwent permanent pacemaker placement by Dr. [name].  He has remote history of pulmonary embolism and had been on Coumadin anticoagulation for many years but was later converted to Eliquis in 2019 following a fall with a hematoma. Echocardiogram performed 02/2019 revealed mildly reduced left ventricular systolic function with ejection fraction estimated 45 to 50%.  There was evidence of moderate aortic stenosis with moderate mitral regurgitation.   Last year, he developed rapidly progressive symptoms of exertional shortness of breath and fatigue ultimately resulting in hospitalization in 04/2020. Echocardiogram performed  May 12, 2020 revealed moderate to severe left ventricular systolic dysfunction with ejection fraction estimated only 30 to 35%.  There was severe aortic stenosis with peak velocity across aortic valve measured 3.9 m/s corresponding to mean transvalvular gradient 35 mmHg and aortic valve area calculated only 0.8 cm with DVI reported 0.20.  There was severe mitral regurgitation with moderate mitral annular calcification and severe tricuspid regurgitation.    Subsequent TEE confirmed the presence of moderately decreased left ventricular systolic function with ejection fraction estimated only 30 to 35%.  There was moderate right ventricular chamber enlargement with moderately reduced right ventricular function and severe pulmonary hypertension.  There was severe aortic stenosis with trivial aortic insufficiency.  There was moderate mitral regurgitation and moderate tricuspid regurgitation.  Catheterization revealed severe native coronary artery disease with chronic occlusion of the mid left anterior descending coronary artery, the right coronary artery, and multiple branches of the left circumflex system.  All 5 previous coronary artery bypass graft remained widely patent without any significant disease.    He was evaluated by the multidisciplinary valve team and underwent successful TAVR with a 29 mm Edwards Sapien 3 THV via the TF approach on 07/12/20. Post operative echo showed EF 35-40%, normally functioning TAVR with a mean gradient of 9 mmHg and no PVL. Moderate MR/TR. He was resumed on his home Asprin 81 mg daily and renally dosed Eliquis 2.5mg  BID. He has done  excellent in follow up and has had a big clinical benefit. 1 month echo showed EF 35-40%, normally functioning TAVR with a mean gradient of 7.7 mm hg and trivial PVL.   Today he presents to clinic for follow up. 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   Past Medical  History:  Diagnosis Date   Anemia    Arthritis    CAD (coronary artery disease)    Chronic kidney disease    stage 3 per daughter   COPD (chronic obstructive pulmonary disease) ([place])    Diabetes ([place])    Fibromyalgia    25 years ago.   History of CVA (cerebrovascular accident)    Hx pulmonary embolism    Hyperlipidemia    Hypertension    Myocardial infarction ([place])    Pneumonia    Presence of permanent cardiac pacemaker    Prostate cancer (HCC)    PVOD (pulmonary veno-occlusive disease) (HCC)    S/P CABG x 5 10/19/1997   LIMA to LAD, SVG to ramus, SVG to OM2-OM3, SVG to PDA   S/P TAVR (transcatheter aortic valve replacement) 07/12/2020   s/p TAVR with a 29 mm Edwards Sapien 3 via the TF approach by Dr. Angelena Form and Dr. Roxy Manns   Squamous cell carcinoma     Past Surgical History:  Procedure Laterality Date   BACK SURGERY     CARDIAC CATHETERIZATION  10/15/1997   Recommend complete revascularization by CABG   CARDIOVASCULAR STRESS TEST  06/12/2012   No evidence of ischemia   CAROTID DOPPLER  08/27/2012   Rt bulb/proximal ICA demonstrated a mild-moderate amount of fibrous plaque without evidence of a significant diameter reduction or any other vascular abnormality.   CAROTID ENDARTERECTOMY Left 02/23/1995   CORONARY ANGIOPLASTY     CORONARY ARTERY BYPASS GRAFT  10/19/1997   x5. LIMA-LAD, SVG-PDA, SVG-OM1, seq SVG-fist and second branches of ramus intermedius.   HERNIA REPAIR     groin area.   INSERT / REPLACE / REMOVE PACEMAKER     NO PAST SURGERIES     PACEMAKER IMPLANT N/A 02/27/2018   Procedure: PACEMAKER IMPLANT;  Surgeon: Constance Haw, MD;  Location: [facility];  Service: Cardiovascular;  Laterality: N/A;   RIGHT/LEFT HEART CATH AND CORONARY/GRAFT ANGIOGRAPHY N/A 05/27/2020   Procedure: RIGHT/LEFT HEART CATH AND CORONARY/GRAFT ANGIOGRAPHY;  Surgeon: Sherren Mocha, MD;  Location: [facility];  Service: Cardiovascular;  Laterality: N/A;   TEE WITHOUT  CARDIOVERSION N/A 05/27/2020   Procedure: TRANSESOPHAGEAL ECHOCARDIOGRAM (TEE);  Surgeon: Sanda Klein, MD;  Location: [facility];  Service: Cardiovascular;  Laterality: N/A;   TEE WITHOUT CARDIOVERSION N/A 07/12/2020   Procedure: TRANSESOPHAGEAL ECHOCARDIOGRAM (TEE);  Surgeon: Burnell Blanks, MD;  Location: [facility];  Service: Open Heart Surgery;  Laterality: N/A;   TRANSCATHETER AORTIC VALVE REPLACEMENT, TRANSFEMORAL N/A 07/12/2020   Procedure: TRANSCATHETER AORTIC VALVE REPLACEMENT, TRANSFEMORAL;  Surgeon: Burnell Blanks, MD;  Location: [facility];  Service: Open Heart Surgery;  Laterality: N/A;    Current Medications: Current Meds  Medication Sig   amiodarone (PACERONE) 200 MG tablet TAKE ONE TABLET BY MOUTH DAILY--please make overdue follow up appt for further refills [phone] 1st attempt   apixaban (ELIQUIS) 2.5 MG TABS tablet Take 1 tablet (2.5 mg total) by mouth 2 (two) times daily.   aspirin EC 81 MG tablet Take 81 mg by mouth daily.   cetirizine (ZYRTEC) 10 MG  tablet Take 10 mg by mouth daily.   ELDERBERRY PO Take 300 mg by mouth daily.   ferrous sulfate 325 (65 FE) MG tablet Take 325 mg by mouth in the morning and at bedtime.    furosemide (LASIX) 20 MG tablet Take 1 tablet (20 mg total) by mouth daily.   glimepiride (AMARYL) 4 MG tablet Take 1 tablet (4 mg total) by mouth daily with breakfast.   linagliptin (TRADJENTA) 5 MG TABS tablet Take 1 tablet (5 mg total) by mouth daily.   losartan (COZAAR) 25 MG tablet Take 1 tablet (25 mg total) by mouth daily.   rosuvastatin (CRESTOR) 10 MG tablet Take 1 tablet (10 mg total) by mouth daily.     Allergies:   Lopressor [metoprolol tartrate] and Lipitor [atorvastatin]   Social History   Socioeconomic History   Marital status: Married    Spouse name: Vandell Kun   Number of children: 1   Years of education: High school 12 years   Highest education level: 12th grade  Occupational History    Occupation: Immunologist    Comment: owns car business  Tobacco Use   Smoking status: Never   Smokeless tobacco: Never  Vaping Use   Vaping Use: Never used  Substance and Sexual Activity   Alcohol use: No   Drug use: Never   Sexual activity: Not Currently  Other Topics Concern   Not on file  Social History Narrative   Not on file   Social Determinants of Health   Financial Resource Strain: Not on file  Food Insecurity: Not on file  Transportation Needs: Not on file  Physical Activity: Not on file  Stress: Not on file  Social Connections: Not on file     Family History: The patient's family history includes Cancer in his sister and sister; Diabetes in his brother, brother, brother, and brother; Heart attack in his father.  ROS:   Please see the history of present illness.    All other systems reviewed and are negative.  EKGs/Labs/Other Studies Reviewed:    The following studies were reviewed today:  TAVR OPERATIVE NOTE     Date of Procedure:                07/12/2020   Preoperative Diagnosis:      Severe Aortic Stenosis    Postoperative Diagnosis:    Same    Procedure:        Transcatheter Aortic Valve Replacement - Percutaneous Right Transfemoral Approach             Edwards Sapien 3 THV (size 29 mm, model # 9600TFX, serial # [id number])              Co-Surgeons:                        Valentina Gu. Roxy Manns, MD and Lauree Chandler, MD   Anesthesiologist:                  Hoy Morn, MD and Belinda Block, MD   Echocardiographer:              Jenkins Rouge, MD   Pre-operative Echo Findings: Severe aortic stenosis Mild-moderate left ventricular systolic dysfunction   Post-operative Echo Findings: No paravalvular leak Unchanged left ventricular systolic function  _______________________  Echo 07/13/20 IMPRESSIONS   1. Diffuse hypokinesis worse in the septum and inferior base . Left  ventricular ejection fraction, by estimation, is 35 to 40%.  The left   ventricle has moderately decreased function. The left ventricle has no  regional wall motion abnormalities. The left  ventricular internal cavity size was mildly dilated. There is moderate  left ventricular hypertrophy. Left ventricular diastolic parameters are  indeterminate.   2. Right ventricular systolic function is normal. The right ventricular  size is normal. There is mildly elevated pulmonary artery systolic  pressure.   3. Left atrial size was moderately dilated.   4. Degree of MR seen best on image 79 3 chamber view . The mitral valve  is degenerative. Moderate mitral valve regurgitation. No evidence of  mitral stenosis. Moderate mitral annular calcification.   5. Tricuspid valve regurgitation is moderate.   6. Post TAVR with 29 mm Sapien 3 valve no significant PVL mean gradient 9  peak 17 mmHg with AVA 1.7 cm2 and DVI 0.41 . The aortic valve has been  repaired/replaced. Aortic valve regurgitation is not visualized. No aortic  stenosis is present. There is a  29 mm Sapien prosthetic (TAVR) valve present in the aortic position.  Procedure Date: 07/12/2020.   7. Aortic dilatation noted. There is mild dilatation of the ascending  aorta, measuring 41 mm.   8. The inferior vena cava is normal in size with greater than 50%  respiratory variability, suggesting right atrial pressure of 3 mmHg.    _______________________  Echo 08/10/20 IMPRESSIONS  1. Left ventricular ejection fraction, by estimation, is 35 to 40%. The left ventricle has moderately decreased function. The left ventricle demonstrates regional wall motion abnormalities (see scoring diagram/findings for description). The left  ventricular internal cavity size was mildly dilated. There is moderate concentric left ventricular hypertrophy. Left ventricular diastolic parameters are consistent with Grade II diastolic dysfunction (pseudonormalization). There is hypokinesis of the left ventricular, entire septal wall. There is  hypokinesis of the left ventricular, basal-mid inferior wall. The average left ventricular global longitudinal strain is -14.6 %. The global longitudinal strain is abnormal.  2. Right ventricular systolic function is normal. The right ventricular size is normal. There is normal pulmonary artery systolic pressure.  3. Left atrial size was moderately dilated.  4. The mitral valve is degenerative. Mild to moderate mitral valve regurgitation. Moderate mitral annular calcification.  5. Tricuspid valve regurgitation is moderate.  6. Trivial perivalvular leak at location of prior RCC. Prior mean gradient 9, AVA 1.7.. The aortic valve has been repaired/replaced. Aortic valve regurgitation is trivial. There is a 29 mm Sapien prosthetic (TAVR) valve present in the aortic position.  Procedure Date: 07/12/20. Aortic valve area, by VTI measures 2.07 cm. Aortic valve mean gradient measures 7.7 mmHg.  7. Aortic dilatation noted. There is mild dilatation of the ascending aorta, measuring 42 mm.  8. The inferior vena cava is normal in size with greater than 50% respiratory variability, suggesting right atrial pressure of 3 mmHg.   Comparison(s): No significant change from prior study. 07/13/20 EF 35-40%. AV 39mmHg mean PG, 50mmHg peak.  ____________________________  Echo 07/05/21 IMPRESSIONS   1. Left ventricular ejection fraction, by estimation, is 40 to 45%. The  left ventricle has mildly decreased function. The left ventricle  demonstrates global hypokinesis with septal lateral dyssynchrony  suggesting interventricular conduction delay. There   is moderate left ventricular hypertrophy. Left ventricular diastolic  parameters are consistent with Grade I diastolic dysfunction (impaired  relaxation).   2. Right ventricular systolic function is normal. The right ventricular  size is normal. There is normal pulmonary artery systolic pressure. The  estimated right ventricular  systolic pressure is 36.6 mmHg.   3.  Left atrial size was moderately dilated.   4. The mitral valve is normal in structure. Trivial mitral valve  regurgitation. No evidence of mitral stenosis. Moderate mitral annular  calcification.   5. Bioprosthetic aortic valve s/p TAVR. 29 mm Edwards Sapien THV. Mean  gradient 6 mmHg with DI 0.54, no significant stenosis. No peri-valvlular leakage visualized.   6. Aortic dilatation noted. There is mild dilatation of the ascending  aorta, measuring 38 mm.   7. The inferior vena cava is normal in size with greater than 50%  respiratory variability, suggesting right atrial pressure of 3 mmHg.  EKG:  EKG is NOT ordered today.    Recent Labs: 07/08/2020: ALT 23; B Natriuretic Peptide 673.4 07/13/2020: BUN 31; Creatinine, Ser 1.63; Hemoglobin 10.8; Magnesium 2.0; Platelets 139; Potassium 4.0; Sodium 137  Recent Lipid Panel No results found for: CHOL, TRIG, HDL, CHOLHDL, VLDL, LDLCALC, LDLDIRECT   Risk Assessment/Calculations:    CHA2DS2-VASc Score = 8  This indicates a 10.8% annual risk of stroke. The patient's score is based upon: CHF History: 1 HTN History: 1 Diabetes History: 1 Stroke History: 2 Vascular Disease History: 1 Age Score: 2 Gender Score: 0    Physical Exam:    VS:  BP 130/62    Pulse 77    Ht 5\' 5"  (1.651 m)    Wt 127 lb (57.6 kg)    SpO2 98%    BMI 21.13 kg/m     Wt Readings from Last 3 Encounters:  07/05/21 127 lb (57.6 kg)  10/25/20 128 lb 6.4 oz (58.2 kg)  08/10/20 124 lb 12.8 oz (56.6 kg)     GEN: Well nourished, well developed in no acute distress HEENT: Normal NECK: No JVD LYMPHATICS: No lymphadenopathy CARDIAC: RRR, no murmurs, rubs, gallops RESPIRATORY:  Clear to auscultation without rales, wheezing or rhonchi  ABDOMEN: Soft, non-tender, non-distended MUSCULOSKELETAL:  No edema; No deformity  SKIN: Warm and dry NEUROLOGIC:  Alert and oriented x 3 PSYCHIATRIC:  Normal affect   ASSESSMENT:    1. S/P TAVR (transcatheter aortic valve  replacement)   2. Paroxysmal atrial fibrillation (HCC)   3. S/P CABG x 5   4. Pacemaker   5. Stenosis of right carotid artery    PLAN:    In order of problems listed above:  Severe AS s/p TAVR: echo today shows EF 40-45%, normally functioning TAVR with a mean gradient of 6 mm hg and no PVL. He has NYHA class I symptoms and still working everyday. Patient has been on chronic Eliquis and aspirin and has done well on this. SBE prophylaxis discussed; the patient is edentulous and does not go to the dentist. Plan to continue regular follow up with Dr. [name]  PAF: patient is paced. Continue on long term renally dosed Eliquis.    CAD s/p CABG: pre TAVR cath showed 5/5 patent bypass grafts. Continue medical therapy. No chest pain.    SSS s/p PPM: followed by Dr. [name]  Carotid artery stenosis s/p previous CEA: known RICA 40-59% stenosis. Follow closely by Dr. [name]   Medication Adjustments/Labs and Tests Ordered: Current medicines are reviewed at length with the patient today.  Concerns regarding medicines are outlined above.  No orders of the defined types were placed in this encounter.  No orders of the defined types were placed in this encounter.   Patient Instructions  Medication Instructions:  Your physician recommends that you continue on your current medications as directed.  Please refer to the Current Medication list given to you today.  *If you need a refill on your cardiac medications before your next appointment, please call your pharmacy*   Lab Work: NONE If you have labs (blood work) drawn today and your tests are completely normal, you will receive your results only by: [place] (if you have MyChart) OR A paper copy in the mail If you have any lab test that is abnormal or we need to change your treatment, we will call you to review the results.   Testing/Procedures: NONE   Follow-Up: At [facility] you and your health needs are our priority.  As part  of our continuing mission to provide you with exceptional heart care, we have created designated Provider Care Teams.  These Care Teams include your primary Cardiologist (physician) and Advanced Practice Providers (APPs -  Physician Assistants and Nurse Practitioners) who all work together to provide you with the care you need, when you need it.  We recommend signing up for the patient portal called "MyChart".  Sign up information is provided on this After Visit Summary.  MyChart is used to connect with patients for Virtual Visits (Telemedicine).  Patients are able to view lab/test results, encounter notes, upcoming appointments, etc.  Non-urgent messages can be sent to your provider as well.   To learn more about what you can do with MyChart, go to [email or web address].    Your next appointment:   [place]   Signed, Angelena Form, PA-C  07/06/2021 11:57 AM    [place]

## 2021-07-05 NOTE — Patient Instructions (Signed)
Medication Instructions:  Your physician recommends that you continue on your current medications as directed. Please refer to the Current Medication list given to you today.  *If you need a refill on your cardiac medications before your next appointment, please call your pharmacy*   Lab Work: NONE If you have labs (blood work) drawn today and your tests are completely normal, you will receive your results only by: [place] (if you have MyChart) OR A paper copy in the mail If you have any lab test that is abnormal or we need to change your treatment, we will call you to review the results.   Testing/Procedures: NONE   Follow-Up: At [facility], you and your health needs are our priority.  As part of our continuing mission to provide you with exceptional heart care, we have created designated Provider Care Teams.  These Care Teams include your primary Cardiologist (physician) and Advanced Practice Providers (APPs -  Physician Assistants and Nurse Practitioners) who all work together to provide you with the care you need, when you need it.  We recommend signing up for the patient portal called "MyChart".  Sign up information is provided on this After Visit Summary.  MyChart is used to connect with patients for Virtual Visits (Telemedicine).  Patients are able to view lab/test results, encounter notes, upcoming appointments, etc.  Non-urgent messages can be sent to your provider as well.   To learn more about what you can do with MyChart, go to [email or web address].    Your next appointment:   KEEP FOLLOW UP WITH DR. [name]

## 2021-08-02 ENCOUNTER — Other Ambulatory Visit: Payer: Medicare HMO

## 2021-08-12 ENCOUNTER — Other Ambulatory Visit: Payer: Self-pay | Admitting: Cardiology

## 2021-08-29 ENCOUNTER — Ambulatory Visit (INDEPENDENT_AMBULATORY_CARE_PROVIDER_SITE_OTHER): Payer: Medicare HMO

## 2021-08-29 DIAGNOSIS — I495 Sick sinus syndrome: Secondary | ICD-10-CM | POA: Diagnosis not present

## 2021-08-29 LAB — CUP PACEART REMOTE DEVICE CHECK
Battery Remaining Longevity: 91 mo
Battery Voltage: 2.99 V
Brady Statistic AP VP Percent: 0.06 %
Brady Statistic AP VS Percent: 99.84 %
Brady Statistic AS VP Percent: 0 %
Brady Statistic AS VS Percent: 0.11 %
Brady Statistic RA Percent Paced: 99.95 %
Brady Statistic RV Percent Paced: 0.06 %
Date Time Interrogation Session: 20230306203423
Implantable Lead Implant Date: 20190905
Implantable Lead Implant Date: 20190905
Implantable Lead Location: 753859
Implantable Lead Location: 753860
Implantable Lead Model: 3830
Implantable Lead Model: 5076
Implantable Pulse Generator Implant Date: 20190905
Lead Channel Impedance Value: 266 Ohm
Lead Channel Impedance Value: 304 Ohm
Lead Channel Impedance Value: 361 Ohm
Lead Channel Impedance Value: 380 Ohm
Lead Channel Pacing Threshold Amplitude: 0.75 V
Lead Channel Pacing Threshold Amplitude: 1.375 V
Lead Channel Pacing Threshold Pulse Width: 0.4 ms
Lead Channel Pacing Threshold Pulse Width: 0.4 ms
Lead Channel Sensing Intrinsic Amplitude: 1.625 mV
Lead Channel Sensing Intrinsic Amplitude: 1.625 mV
Lead Channel Sensing Intrinsic Amplitude: 6.75 mV
Lead Channel Sensing Intrinsic Amplitude: 6.75 mV
Lead Channel Setting Pacing Amplitude: 1.5 V
Lead Channel Setting Pacing Amplitude: 2.5 V
Lead Channel Setting Pacing Pulse Width: 0.8 ms
Lead Channel Setting Sensing Sensitivity: 1.2 mV

## 2021-09-05 DIAGNOSIS — D485 Neoplasm of uncertain behavior of skin: Secondary | ICD-10-CM | POA: Diagnosis not present

## 2021-09-05 DIAGNOSIS — Z85828 Personal history of other malignant neoplasm of skin: Secondary | ICD-10-CM | POA: Diagnosis not present

## 2021-09-05 DIAGNOSIS — L98499 Non-pressure chronic ulcer of skin of other sites with unspecified severity: Secondary | ICD-10-CM | POA: Diagnosis not present

## 2021-09-11 NOTE — Progress Notes (Signed)
Remote pacemaker transmission.   

## 2021-09-12 DIAGNOSIS — I38 Endocarditis, valve unspecified: Secondary | ICD-10-CM | POA: Diagnosis not present

## 2021-09-12 DIAGNOSIS — I480 Paroxysmal atrial fibrillation: Secondary | ICD-10-CM | POA: Diagnosis not present

## 2021-09-12 DIAGNOSIS — I10 Essential (primary) hypertension: Secondary | ICD-10-CM | POA: Diagnosis not present

## 2021-09-12 DIAGNOSIS — E785 Hyperlipidemia, unspecified: Secondary | ICD-10-CM | POA: Diagnosis not present

## 2021-09-12 DIAGNOSIS — I509 Heart failure, unspecified: Secondary | ICD-10-CM | POA: Diagnosis not present

## 2021-09-12 DIAGNOSIS — E1121 Type 2 diabetes mellitus with diabetic nephropathy: Secondary | ICD-10-CM | POA: Diagnosis not present

## 2021-09-12 DIAGNOSIS — Z1331 Encounter for screening for depression: Secondary | ICD-10-CM | POA: Diagnosis not present

## 2021-09-12 DIAGNOSIS — Z9181 History of falling: Secondary | ICD-10-CM | POA: Diagnosis not present

## 2021-09-12 DIAGNOSIS — D539 Nutritional anemia, unspecified: Secondary | ICD-10-CM | POA: Diagnosis not present

## 2021-09-22 DIAGNOSIS — I480 Paroxysmal atrial fibrillation: Secondary | ICD-10-CM | POA: Diagnosis not present

## 2021-09-22 DIAGNOSIS — E1121 Type 2 diabetes mellitus with diabetic nephropathy: Secondary | ICD-10-CM | POA: Diagnosis not present

## 2021-09-22 DIAGNOSIS — N1832 Chronic kidney disease, stage 3b: Secondary | ICD-10-CM | POA: Diagnosis not present

## 2021-09-26 ENCOUNTER — Other Ambulatory Visit: Payer: Self-pay | Admitting: Cardiology

## 2021-09-26 MED ORDER — AMIODARONE HCL 200 MG PO TABS: ORAL_TABLET | ORAL | 0 refills | Status: DC

## 2021-09-26 MED ORDER — AMIODARONE HCL 200 MG PO TABS: ORAL_TABLET | ORAL | 0 refills | Status: AC

## 2021-09-26 NOTE — Addendum Note (Signed)
Addended by: Carter Kitten D on: 09/26/2021 02:46 PM ? ? Modules accepted: Orders ? ?

## 2021-09-26 NOTE — Addendum Note (Signed)
Addended by: Carter Kitten D on: 09/26/2021 04:00 PM ? ? Modules accepted: Orders ? ?

## 2021-10-05 ENCOUNTER — Other Ambulatory Visit: Payer: Self-pay | Admitting: Cardiology

## 2021-11-28 ENCOUNTER — Ambulatory Visit (INDEPENDENT_AMBULATORY_CARE_PROVIDER_SITE_OTHER): Payer: Medicare HMO

## 2021-11-28 DIAGNOSIS — I495 Sick sinus syndrome: Secondary | ICD-10-CM | POA: Diagnosis not present

## 2021-11-28 LAB — CUP PACEART REMOTE DEVICE CHECK
Battery Remaining Longevity: 90 mo
Battery Voltage: 2.99 V
Brady Statistic AP VP Percent: 0.06 %
Brady Statistic AP VS Percent: 99.85 %
Brady Statistic AS VP Percent: 0 %
Brady Statistic AS VS Percent: 0.09 %
Brady Statistic RA Percent Paced: 99.94 %
Brady Statistic RV Percent Paced: 0.06 %
Date Time Interrogation Session: 20230605073141
Implantable Lead Implant Date: 20190905
Implantable Lead Implant Date: 20190905
Implantable Lead Location: 753859
Implantable Lead Location: 753860
Implantable Lead Model: 3830
Implantable Lead Model: 5076
Implantable Pulse Generator Implant Date: 20190905
Lead Channel Impedance Value: 266 Ohm
Lead Channel Impedance Value: 323 Ohm
Lead Channel Impedance Value: 361 Ohm
Lead Channel Impedance Value: 399 Ohm
Lead Channel Pacing Threshold Amplitude: 0.75 V
Lead Channel Pacing Threshold Amplitude: 1.25 V
Lead Channel Pacing Threshold Pulse Width: 0.4 ms
Lead Channel Pacing Threshold Pulse Width: 0.4 ms
Lead Channel Sensing Intrinsic Amplitude: 2 mV
Lead Channel Sensing Intrinsic Amplitude: 2 mV
Lead Channel Sensing Intrinsic Amplitude: 7.625 mV
Lead Channel Sensing Intrinsic Amplitude: 7.625 mV
Lead Channel Setting Pacing Amplitude: 1.5 V
Lead Channel Setting Pacing Amplitude: 2.5 V
Lead Channel Setting Pacing Pulse Width: 0.8 ms
Lead Channel Setting Sensing Sensitivity: 1.2 mV

## 2021-12-05 DIAGNOSIS — L570 Actinic keratosis: Secondary | ICD-10-CM | POA: Diagnosis not present

## 2021-12-05 DIAGNOSIS — Z85828 Personal history of other malignant neoplasm of skin: Secondary | ICD-10-CM | POA: Diagnosis not present

## 2021-12-13 NOTE — Progress Notes (Signed)
Remote pacemaker transmission.   

## 2022-01-09 DIAGNOSIS — Z8546 Personal history of malignant neoplasm of prostate: Secondary | ICD-10-CM | POA: Diagnosis not present

## 2022-01-09 DIAGNOSIS — Z1331 Encounter for screening for depression: Secondary | ICD-10-CM | POA: Diagnosis not present

## 2022-01-09 DIAGNOSIS — I38 Endocarditis, valve unspecified: Secondary | ICD-10-CM | POA: Diagnosis not present

## 2022-01-09 DIAGNOSIS — E785 Hyperlipidemia, unspecified: Secondary | ICD-10-CM | POA: Diagnosis not present

## 2022-01-09 DIAGNOSIS — I700 Atherosclerosis of aorta: Secondary | ICD-10-CM | POA: Diagnosis not present

## 2022-01-09 DIAGNOSIS — I480 Paroxysmal atrial fibrillation: Secondary | ICD-10-CM | POA: Diagnosis not present

## 2022-01-09 DIAGNOSIS — Z139 Encounter for screening, unspecified: Secondary | ICD-10-CM | POA: Diagnosis not present

## 2022-01-09 DIAGNOSIS — Z9181 History of falling: Secondary | ICD-10-CM | POA: Diagnosis not present

## 2022-01-09 DIAGNOSIS — D539 Nutritional anemia, unspecified: Secondary | ICD-10-CM | POA: Diagnosis not present

## 2022-01-09 DIAGNOSIS — I10 Essential (primary) hypertension: Secondary | ICD-10-CM | POA: Diagnosis not present

## 2022-01-09 DIAGNOSIS — I509 Heart failure, unspecified: Secondary | ICD-10-CM | POA: Diagnosis not present

## 2022-01-09 DIAGNOSIS — E1121 Type 2 diabetes mellitus with diabetic nephropathy: Secondary | ICD-10-CM | POA: Diagnosis not present

## 2022-02-27 ENCOUNTER — Ambulatory Visit (INDEPENDENT_AMBULATORY_CARE_PROVIDER_SITE_OTHER): Payer: Medicare HMO

## 2022-02-27 DIAGNOSIS — I495 Sick sinus syndrome: Secondary | ICD-10-CM

## 2022-02-28 LAB — CUP PACEART REMOTE DEVICE CHECK
Battery Remaining Longevity: 88 mo
Battery Voltage: 2.98 V
Brady Statistic AP VP Percent: 0.05 %
Brady Statistic AP VS Percent: 99.9 %
Brady Statistic AS VP Percent: 0 %
Brady Statistic AS VS Percent: 0.05 %
Brady Statistic RA Percent Paced: 99.96 %
Brady Statistic RV Percent Paced: 0.05 %
Date Time Interrogation Session: 20230905071823
Implantable Lead Implant Date: 20190905
Implantable Lead Implant Date: 20190905
Implantable Lead Location: 753859
Implantable Lead Location: 753860
Implantable Lead Model: 3830
Implantable Lead Model: 5076
Implantable Pulse Generator Implant Date: 20190905
Lead Channel Impedance Value: 266 Ohm
Lead Channel Impedance Value: 323 Ohm
Lead Channel Impedance Value: 361 Ohm
Lead Channel Impedance Value: 399 Ohm
Lead Channel Pacing Threshold Amplitude: 0.75 V
Lead Channel Pacing Threshold Amplitude: 1.5 V
Lead Channel Pacing Threshold Pulse Width: 0.4 ms
Lead Channel Pacing Threshold Pulse Width: 0.4 ms
Lead Channel Sensing Intrinsic Amplitude: 2.125 mV
Lead Channel Sensing Intrinsic Amplitude: 2.125 mV
Lead Channel Sensing Intrinsic Amplitude: 7.375 mV
Lead Channel Sensing Intrinsic Amplitude: 7.375 mV
Lead Channel Setting Pacing Amplitude: 1.5 V
Lead Channel Setting Pacing Amplitude: 2.5 V
Lead Channel Setting Pacing Pulse Width: 0.8 ms
Lead Channel Setting Sensing Sensitivity: 1.2 mV

## 2022-03-23 NOTE — Progress Notes (Signed)
Remote pacemaker transmission.   

## 2022-04-17 DIAGNOSIS — E785 Hyperlipidemia, unspecified: Secondary | ICD-10-CM | POA: Diagnosis not present

## 2022-04-17 DIAGNOSIS — I10 Essential (primary) hypertension: Secondary | ICD-10-CM | POA: Diagnosis not present

## 2022-04-17 DIAGNOSIS — I38 Endocarditis, valve unspecified: Secondary | ICD-10-CM | POA: Diagnosis not present

## 2022-04-17 DIAGNOSIS — I509 Heart failure, unspecified: Secondary | ICD-10-CM | POA: Diagnosis not present

## 2022-04-17 DIAGNOSIS — D539 Nutritional anemia, unspecified: Secondary | ICD-10-CM | POA: Diagnosis not present

## 2022-04-17 DIAGNOSIS — Z23 Encounter for immunization: Secondary | ICD-10-CM | POA: Diagnosis not present

## 2022-04-17 DIAGNOSIS — N1832 Chronic kidney disease, stage 3b: Secondary | ICD-10-CM | POA: Diagnosis not present

## 2022-04-17 DIAGNOSIS — I480 Paroxysmal atrial fibrillation: Secondary | ICD-10-CM | POA: Diagnosis not present

## 2022-04-17 DIAGNOSIS — E1121 Type 2 diabetes mellitus with diabetic nephropathy: Secondary | ICD-10-CM | POA: Diagnosis not present

## 2022-04-18 ENCOUNTER — Ambulatory Visit: Payer: Self-pay | Admitting: Licensed Clinical Social Worker

## 2022-04-18 NOTE — Patient Outreach (Signed)
  Care Coordination   04/18/2022 Name: Bryan Carney MRN: [id number] DOB: 10-03-1940   Care Coordination Outreach Attempts:  An unsuccessful telephone outreach was attempted today to offer the patient information about available care coordination services as a benefit of their health plan.   Follow Up Plan:  Additional outreach attempts will be made to offer the patient care coordination information and services.   Encounter Outcome:  No Answer  Care Coordination Interventions Activated:  No   Care Coordination Interventions:  No, not indicated    Milus Height, BSW, MSW, [place]  Social Worker [facility] Care Management  [phone]

## 2022-04-18 NOTE — Patient Outreach (Signed)
  Care Coordination   Initial Visit Note   04/18/2022 Name: Bryan Carney MRN: [id number] DOB: 04-27-1941  Bryan Carney is Carney 81 y.o. year old male who sees [name], Amy A, NP for primary care. I spoke with  Bryan Carney by phone today.  What matters to the patients health and wellness today?  Patient Declined     Goals Addressed               This Visit's Progress     Care Coordination Activities- Patient Declined (pt-stated)        Patient is still working 5-days [place] week. No assistance is needed at this time.          SDOH assessments and interventions completed:  Yes     Care Coordination Interventions Activated:  Yes  Care Coordination Interventions:  Yes, provided   Follow up plan: No further intervention required.   Encounter Outcome:  Pt. Visit Completed

## 2022-04-18 NOTE — Patient Instructions (Signed)
Visit Information  Thank you for taking time to visit with me today. Please don't hesitate to contact me if I can be of assistance to you.   Following are the goals we discussed today:   Goals Addressed               This Visit's Progress     Care Coordination Activities- Patient Declined (pt-stated)        Patient is still working 5-days a week. No assistance is needed at this time.           Patient verbalizes understanding of instructions and care plan provided today and agrees to view in [place]. Active MyChart status and patient understanding of how to access instructions and care plan via MyChart confirmed with patient.     No further follow up required: .  Milus Height, Arita Miss , MSW, [place] Social Worker [facility] Care Management  [phone]

## 2022-04-24 DIAGNOSIS — I509 Heart failure, unspecified: Secondary | ICD-10-CM | POA: Diagnosis not present

## 2022-04-24 DIAGNOSIS — E1121 Type 2 diabetes mellitus with diabetic nephropathy: Secondary | ICD-10-CM | POA: Diagnosis not present

## 2022-04-24 DIAGNOSIS — I10 Essential (primary) hypertension: Secondary | ICD-10-CM | POA: Diagnosis not present

## 2022-05-29 ENCOUNTER — Ambulatory Visit (INDEPENDENT_AMBULATORY_CARE_PROVIDER_SITE_OTHER): Payer: Medicare HMO

## 2022-05-29 DIAGNOSIS — I495 Sick sinus syndrome: Secondary | ICD-10-CM | POA: Diagnosis not present

## 2022-05-29 LAB — CUP PACEART REMOTE DEVICE CHECK
Battery Remaining Longevity: 86 mo
Battery Voltage: 2.98 V
Brady Statistic AP VP Percent: 0.05 %
Brady Statistic AP VS Percent: 99.89 %
Brady Statistic AS VP Percent: 0 %
Brady Statistic AS VS Percent: 0.06 %
Brady Statistic RA Percent Paced: 99.89 %
Brady Statistic RV Percent Paced: 0.07 %
Date Time Interrogation Session: 20231205064039
Implantable Lead Connection Status: 753985
Implantable Lead Connection Status: 753985
Implantable Lead Implant Date: 20190905
Implantable Lead Implant Date: 20190905
Implantable Lead Location: 753859
Implantable Lead Location: 753860
Implantable Lead Model: 3830
Implantable Lead Model: 5076
Implantable Pulse Generator Implant Date: 20190905
Lead Channel Impedance Value: 247 Ohm
Lead Channel Impedance Value: 285 Ohm
Lead Channel Impedance Value: 361 Ohm
Lead Channel Impedance Value: 380 Ohm
Lead Channel Pacing Threshold Amplitude: 0.75 V
Lead Channel Pacing Threshold Amplitude: 1.5 V
Lead Channel Pacing Threshold Pulse Width: 0.4 ms
Lead Channel Pacing Threshold Pulse Width: 0.4 ms
Lead Channel Sensing Intrinsic Amplitude: 1.25 mV
Lead Channel Sensing Intrinsic Amplitude: 1.25 mV
Lead Channel Sensing Intrinsic Amplitude: 7 mV
Lead Channel Sensing Intrinsic Amplitude: 7 mV
Lead Channel Setting Pacing Amplitude: 1.5 V
Lead Channel Setting Pacing Amplitude: 2.5 V
Lead Channel Setting Pacing Pulse Width: 0.8 ms
Lead Channel Setting Sensing Sensitivity: 1.2 mV
Zone Setting Status: 755011
Zone Setting Status: 755011

## 2022-06-06 DIAGNOSIS — Z85828 Personal history of other malignant neoplasm of skin: Secondary | ICD-10-CM | POA: Diagnosis not present

## 2022-06-06 DIAGNOSIS — L570 Actinic keratosis: Secondary | ICD-10-CM | POA: Diagnosis not present

## 2022-06-27 NOTE — Progress Notes (Signed)
Remote pacemaker transmission.   

## 2022-07-31 DIAGNOSIS — I10 Essential (primary) hypertension: Secondary | ICD-10-CM | POA: Diagnosis not present

## 2022-07-31 DIAGNOSIS — D539 Nutritional anemia, unspecified: Secondary | ICD-10-CM | POA: Diagnosis not present

## 2022-07-31 DIAGNOSIS — I38 Endocarditis, valve unspecified: Secondary | ICD-10-CM | POA: Diagnosis not present

## 2022-07-31 DIAGNOSIS — I509 Heart failure, unspecified: Secondary | ICD-10-CM | POA: Diagnosis not present

## 2022-07-31 DIAGNOSIS — N1832 Chronic kidney disease, stage 3b: Secondary | ICD-10-CM | POA: Diagnosis not present

## 2022-07-31 DIAGNOSIS — I480 Paroxysmal atrial fibrillation: Secondary | ICD-10-CM | POA: Diagnosis not present

## 2022-07-31 DIAGNOSIS — E1121 Type 2 diabetes mellitus with diabetic nephropathy: Secondary | ICD-10-CM | POA: Diagnosis not present

## 2022-07-31 DIAGNOSIS — I700 Atherosclerosis of aorta: Secondary | ICD-10-CM | POA: Diagnosis not present

## 2022-07-31 DIAGNOSIS — E785 Hyperlipidemia, unspecified: Secondary | ICD-10-CM | POA: Diagnosis not present

## 2022-08-28 ENCOUNTER — Ambulatory Visit (INDEPENDENT_AMBULATORY_CARE_PROVIDER_SITE_OTHER): Payer: Medicare HMO

## 2022-08-28 DIAGNOSIS — I495 Sick sinus syndrome: Secondary | ICD-10-CM | POA: Diagnosis not present

## 2022-08-28 LAB — CUP PACEART REMOTE DEVICE CHECK
Battery Remaining Longevity: 82 mo
Battery Voltage: 2.98 V
Brady Statistic AP VP Percent: 0.06 %
Brady Statistic AP VS Percent: 99.78 %
Brady Statistic AS VP Percent: 0 %
Brady Statistic AS VS Percent: 0.15 %
Brady Statistic RA Percent Paced: 99.84 %
Brady Statistic RV Percent Paced: 0.07 %
Date Time Interrogation Session: 20240305063231
Implantable Lead Connection Status: 753985
Implantable Lead Connection Status: 753985
Implantable Lead Implant Date: 20190905
Implantable Lead Implant Date: 20190905
Implantable Lead Location: 753859
Implantable Lead Location: 753860
Implantable Lead Model: 3830
Implantable Lead Model: 5076
Implantable Pulse Generator Implant Date: 20190905
Lead Channel Impedance Value: 266 Ohm
Lead Channel Impedance Value: 304 Ohm
Lead Channel Impedance Value: 361 Ohm
Lead Channel Impedance Value: 399 Ohm
Lead Channel Pacing Threshold Amplitude: 0.875 V
Lead Channel Pacing Threshold Amplitude: 1.875 V
Lead Channel Pacing Threshold Pulse Width: 0.4 ms
Lead Channel Pacing Threshold Pulse Width: 0.4 ms
Lead Channel Sensing Intrinsic Amplitude: 1.5 mV
Lead Channel Sensing Intrinsic Amplitude: 1.5 mV
Lead Channel Sensing Intrinsic Amplitude: 6.75 mV
Lead Channel Sensing Intrinsic Amplitude: 6.75 mV
Lead Channel Setting Pacing Amplitude: 1.75 V
Lead Channel Setting Pacing Amplitude: 2.5 V
Lead Channel Setting Pacing Pulse Width: 0.8 ms
Lead Channel Setting Sensing Sensitivity: 1.2 mV
Zone Setting Status: 755011
Zone Setting Status: 755011

## 2022-10-05 NOTE — Progress Notes (Signed)
Remote pacemaker transmission.   

## 2022-11-22 DIAGNOSIS — D539 Nutritional anemia, unspecified: Secondary | ICD-10-CM | POA: Diagnosis not present

## 2022-11-22 DIAGNOSIS — I38 Endocarditis, valve unspecified: Secondary | ICD-10-CM | POA: Diagnosis not present

## 2022-11-22 DIAGNOSIS — I480 Paroxysmal atrial fibrillation: Secondary | ICD-10-CM | POA: Diagnosis not present

## 2022-11-22 DIAGNOSIS — E1121 Type 2 diabetes mellitus with diabetic nephropathy: Secondary | ICD-10-CM | POA: Diagnosis not present

## 2022-11-22 DIAGNOSIS — I509 Heart failure, unspecified: Secondary | ICD-10-CM | POA: Diagnosis not present

## 2022-11-22 DIAGNOSIS — I10 Essential (primary) hypertension: Secondary | ICD-10-CM | POA: Diagnosis not present

## 2022-11-22 DIAGNOSIS — Z8546 Personal history of malignant neoplasm of prostate: Secondary | ICD-10-CM | POA: Diagnosis not present

## 2022-11-22 DIAGNOSIS — E785 Hyperlipidemia, unspecified: Secondary | ICD-10-CM | POA: Diagnosis not present

## 2022-11-27 ENCOUNTER — Ambulatory Visit (INDEPENDENT_AMBULATORY_CARE_PROVIDER_SITE_OTHER): Payer: Medicare HMO

## 2022-11-27 DIAGNOSIS — I495 Sick sinus syndrome: Secondary | ICD-10-CM | POA: Diagnosis not present

## 2022-11-27 LAB — CUP PACEART REMOTE DEVICE CHECK
Battery Remaining Longevity: 80 mo
Battery Voltage: 2.98 V
Brady Statistic AP VP Percent: 0.07 %
Brady Statistic AP VS Percent: 99.82 %
Brady Statistic AS VP Percent: 0 %
Brady Statistic AS VS Percent: 0.11 %
Brady Statistic RA Percent Paced: 99.9 %
Brady Statistic RV Percent Paced: 0.07 %
Date Time Interrogation Session: 20240604065427
Implantable Lead Connection Status: 753985
Implantable Lead Connection Status: 753985
Implantable Lead Implant Date: 20190905
Implantable Lead Implant Date: 20190905
Implantable Lead Location: 753859
Implantable Lead Location: 753860
Implantable Lead Model: 3830
Implantable Lead Model: 5076
Implantable Pulse Generator Implant Date: 20190905
Lead Channel Impedance Value: 247 Ohm
Lead Channel Impedance Value: 304 Ohm
Lead Channel Impedance Value: 361 Ohm
Lead Channel Impedance Value: 380 Ohm
Lead Channel Pacing Threshold Amplitude: 0.75 V
Lead Channel Pacing Threshold Amplitude: 2.125 V
Lead Channel Pacing Threshold Pulse Width: 0.4 ms
Lead Channel Pacing Threshold Pulse Width: 0.4 ms
Lead Channel Sensing Intrinsic Amplitude: 1.625 mV
Lead Channel Sensing Intrinsic Amplitude: 1.625 mV
Lead Channel Sensing Intrinsic Amplitude: 6.25 mV
Lead Channel Sensing Intrinsic Amplitude: 6.25 mV
Lead Channel Setting Pacing Amplitude: 1.5 V
Lead Channel Setting Pacing Amplitude: 2.5 V
Lead Channel Setting Pacing Pulse Width: 0.8 ms
Lead Channel Setting Sensing Sensitivity: 1.2 mV
Zone Setting Status: 755011
Zone Setting Status: 755011

## 2022-12-01 DIAGNOSIS — W19XXXA Unspecified fall, initial encounter: Secondary | ICD-10-CM | POA: Diagnosis not present

## 2022-12-01 DIAGNOSIS — M25571 Pain in right ankle and joints of right foot: Secondary | ICD-10-CM | POA: Diagnosis not present

## 2022-12-01 DIAGNOSIS — M25562 Pain in left knee: Secondary | ICD-10-CM | POA: Diagnosis not present

## 2022-12-01 DIAGNOSIS — M11262 Other chondrocalcinosis, left knee: Secondary | ICD-10-CM | POA: Diagnosis not present

## 2022-12-05 DIAGNOSIS — L821 Other seborrheic keratosis: Secondary | ICD-10-CM | POA: Diagnosis not present

## 2022-12-05 DIAGNOSIS — C4442 Squamous cell carcinoma of skin of scalp and neck: Secondary | ICD-10-CM | POA: Diagnosis not present

## 2022-12-05 DIAGNOSIS — Z85828 Personal history of other malignant neoplasm of skin: Secondary | ICD-10-CM | POA: Diagnosis not present

## 2022-12-05 DIAGNOSIS — L570 Actinic keratosis: Secondary | ICD-10-CM | POA: Diagnosis not present

## 2022-12-05 DIAGNOSIS — D485 Neoplasm of uncertain behavior of skin: Secondary | ICD-10-CM | POA: Diagnosis not present

## 2022-12-06 ENCOUNTER — Telehealth: Payer: Self-pay

## 2022-12-06 DIAGNOSIS — M25562 Pain in left knee: Secondary | ICD-10-CM | POA: Diagnosis not present

## 2022-12-06 DIAGNOSIS — I10 Essential (primary) hypertension: Secondary | ICD-10-CM | POA: Diagnosis not present

## 2022-12-06 DIAGNOSIS — M25571 Pain in right ankle and joints of right foot: Secondary | ICD-10-CM | POA: Diagnosis not present

## 2022-12-06 DIAGNOSIS — S7001XA Contusion of right hip, initial encounter: Secondary | ICD-10-CM | POA: Diagnosis not present

## 2022-12-06 NOTE — Telephone Encounter (Signed)
Transition Care Management Follow-up Telephone Call Date of discharge and from where: [facility] 6/8 How have you been since you were released from the hospital? Doing good Any questions or concerns? No  Items Reviewed: Did the pt receive and understand the discharge instructions provided? Yes  Medications obtained and verified? Yes  Other? No  Any new allergies since your discharge? No  Dietary orders reviewed? No Do you have support at home? Yes     Follow up appointments reviewed:  PCP Hospital f/u appt confirmed? Yes  Scheduled to see PCP on 6/13 @ 2:00. Specialist Hospital f/u appt confirmed? No  Scheduled to see  on  @ . Are transportation arrangements needed? No  If their condition worsens, is the pt aware to call PCP or go to the Emergency Dept.? Yes Was the patient provided with contact information for the PCP's office or ED? Yes Was to pt encouraged to call back with questions or concerns? Yes

## 2022-12-12 ENCOUNTER — Ambulatory Visit: Payer: Medicare HMO | Admitting: Podiatry

## 2022-12-12 DIAGNOSIS — B351 Tinea unguium: Secondary | ICD-10-CM

## 2022-12-12 DIAGNOSIS — E1151 Type 2 diabetes mellitus with diabetic peripheral angiopathy without gangrene: Secondary | ICD-10-CM

## 2022-12-12 DIAGNOSIS — L84 Corns and callosities: Secondary | ICD-10-CM | POA: Diagnosis not present

## 2022-12-12 NOTE — Progress Notes (Signed)
        Subjective:  Patient ID: Bryan Carney, male    DOB: 1940/11/29,  MRN: [id number]  Bryan Carney presents to clinic today for:  Chief Complaint  Patient presents with   Nail Problem    Diabetic Foot Care- nail trim   . Patient notes nails are thick and elongated, causing pain in shoe gear when ambulating.  He uses a cane for assistance with ambulation.  He notes his wife is in the hospital this morning getting surgery.  PCP is [name], Amy A, NP.  Allergies  Allergen Reactions   Lopressor [Metoprolol Tartrate] Other (See Comments)    Severe BP drop   Lipitor [Atorvastatin]     Muscle aches    Review of Systems: Negative except as noted in the HPI.  Objective:  There were no vitals filed for this visit.  Bryan Carney is a pleasant 82 y.o. male in NAD. AAO x 3.  Vascular Examination: Patient has palpable DP pulse, absent PT pulse bilateral.  Delayed capillary refill bilateral toes.  Sparse digital hair bilateral.  Proximal to distal cooling WNL bilateral.    Dermatological Examination: Interspaces are clear with no open lesions noted bilateral.  Nails are 3-70mm thick, with yellowish/brown discoloration, subungual debris and distal onycholysis x10.  There is pain with compression of nails x10.  There are hyperkeratotic lesions noted submet 5 bilateral.  Neurological Examination: Protective sensation intact b/l LE. Vibratory sensation intact b/l LE.  Musculoskeletal Examination: Muscle strength 5/5 to all LE muscle groups b/l.   Patient qualifies for at-risk foot care because of diabetes with PVD.  Assessment/Plan:   Mycotic nails x10 were sharply debrided with sterile nail nippers and power debriding burr to decrease bulk and length.  Hyperkeratotic lesions x 2 were shaved with #312 blade.   Return in about 3 months (around 03/14/2023) for [facility].   Clerance Lav, DPM, FACFAS [facility]     [address] [place], [place] [address]                Office [phone]  Fax [phone]

## 2022-12-24 NOTE — Progress Notes (Signed)
Remote pacemaker transmission.   

## 2022-12-28 DIAGNOSIS — M25571 Pain in right ankle and joints of right foot: Secondary | ICD-10-CM | POA: Diagnosis not present

## 2022-12-28 DIAGNOSIS — S99921A Unspecified injury of right foot, initial encounter: Secondary | ICD-10-CM | POA: Diagnosis not present

## 2023-01-03 DIAGNOSIS — M25571 Pain in right ankle and joints of right foot: Secondary | ICD-10-CM | POA: Diagnosis not present

## 2023-01-03 DIAGNOSIS — I10 Essential (primary) hypertension: Secondary | ICD-10-CM | POA: Diagnosis not present

## 2023-01-03 DIAGNOSIS — E1121 Type 2 diabetes mellitus with diabetic nephropathy: Secondary | ICD-10-CM | POA: Diagnosis not present

## 2023-02-26 ENCOUNTER — Ambulatory Visit (INDEPENDENT_AMBULATORY_CARE_PROVIDER_SITE_OTHER): Payer: Medicare HMO

## 2023-02-26 DIAGNOSIS — I495 Sick sinus syndrome: Secondary | ICD-10-CM

## 2023-02-26 LAB — CUP PACEART REMOTE DEVICE CHECK
Battery Remaining Longevity: 77 mo
Battery Voltage: 2.97 V
Brady Statistic AP VP Percent: 0.06 %
Brady Statistic AP VS Percent: 99.87 %
Brady Statistic AS VP Percent: 0 %
Brady Statistic AS VS Percent: 0.08 %
Brady Statistic RA Percent Paced: 99.43 %
Brady Statistic RV Percent Paced: 0.26 %
Date Time Interrogation Session: 20240903092308
Implantable Lead Connection Status: 753985
Implantable Lead Connection Status: 753985
Implantable Lead Implant Date: 20190905
Implantable Lead Implant Date: 20190905
Implantable Lead Location: 753859
Implantable Lead Location: 753860
Implantable Lead Model: 3830
Implantable Lead Model: 5076
Implantable Pulse Generator Implant Date: 20190905
Lead Channel Impedance Value: 247 Ohm
Lead Channel Impedance Value: 304 Ohm
Lead Channel Impedance Value: 361 Ohm
Lead Channel Impedance Value: 380 Ohm
Lead Channel Pacing Threshold Amplitude: 0.75 V
Lead Channel Pacing Threshold Amplitude: 1.75 V
Lead Channel Pacing Threshold Pulse Width: 0.4 ms
Lead Channel Pacing Threshold Pulse Width: 0.4 ms
Lead Channel Sensing Intrinsic Amplitude: 1.375 mV
Lead Channel Sensing Intrinsic Amplitude: 1.375 mV
Lead Channel Sensing Intrinsic Amplitude: 5.625 mV
Lead Channel Sensing Intrinsic Amplitude: 5.625 mV
Lead Channel Setting Pacing Amplitude: 1.5 V
Lead Channel Setting Pacing Amplitude: 2.5 V
Lead Channel Setting Pacing Pulse Width: 0.8 ms
Lead Channel Setting Sensing Sensitivity: 1.2 mV
Zone Setting Status: 755011
Zone Setting Status: 755011

## 2023-03-11 NOTE — Progress Notes (Signed)
Remote pacemaker transmission.   

## 2023-03-26 DIAGNOSIS — D539 Nutritional anemia, unspecified: Secondary | ICD-10-CM | POA: Diagnosis not present

## 2023-03-26 DIAGNOSIS — I2510 Atherosclerotic heart disease of native coronary artery without angina pectoris: Secondary | ICD-10-CM | POA: Diagnosis not present

## 2023-03-26 DIAGNOSIS — Z6821 Body mass index (BMI) 21.0-21.9, adult: Secondary | ICD-10-CM | POA: Diagnosis not present

## 2023-03-26 DIAGNOSIS — I10 Essential (primary) hypertension: Secondary | ICD-10-CM | POA: Diagnosis not present

## 2023-03-26 DIAGNOSIS — E785 Hyperlipidemia, unspecified: Secondary | ICD-10-CM | POA: Diagnosis not present

## 2023-03-26 DIAGNOSIS — I480 Paroxysmal atrial fibrillation: Secondary | ICD-10-CM | POA: Diagnosis not present

## 2023-03-26 DIAGNOSIS — E1121 Type 2 diabetes mellitus with diabetic nephropathy: Secondary | ICD-10-CM | POA: Diagnosis not present

## 2023-03-26 DIAGNOSIS — R053 Chronic cough: Secondary | ICD-10-CM | POA: Diagnosis not present

## 2023-03-26 DIAGNOSIS — Z23 Encounter for immunization: Secondary | ICD-10-CM | POA: Diagnosis not present

## 2023-05-03 DIAGNOSIS — Z0000 Encounter for general adult medical examination without abnormal findings: Secondary | ICD-10-CM | POA: Diagnosis not present

## 2023-05-03 DIAGNOSIS — Z9181 History of falling: Secondary | ICD-10-CM | POA: Diagnosis not present

## 2023-05-10 DIAGNOSIS — Z974 Presence of external hearing-aid: Secondary | ICD-10-CM | POA: Diagnosis not present

## 2023-05-10 DIAGNOSIS — H9190 Unspecified hearing loss, unspecified ear: Secondary | ICD-10-CM | POA: Diagnosis not present

## 2023-05-16 ENCOUNTER — Ambulatory Visit: Payer: Medicare HMO | Admitting: Podiatry

## 2023-05-16 DIAGNOSIS — E1151 Type 2 diabetes mellitus with diabetic peripheral angiopathy without gangrene: Secondary | ICD-10-CM

## 2023-05-16 DIAGNOSIS — B351 Tinea unguium: Secondary | ICD-10-CM

## 2023-05-16 NOTE — Progress Notes (Signed)
       Subjective:  Patient ID: Bryan Carney, male    DOB: 01/14/1941,  MRN: [id number]  Bryan Carney presents to clinic today for:  Chief Complaint  Patient presents with   Diabetes    DFC A1C - 7.5 Blood Thinner   Patient notes nails are thick and elongated, causing pain in shoe gear when ambulating.    PCP is [name], Amy A, NP.  Date last seen was 01/03/2023  Past Medical History:  Diagnosis Date   Anemia    Arthritis    CAD (coronary artery disease)    Chronic kidney disease    stage 3 per daughter   COPD (chronic obstructive pulmonary disease) (HCC)    Diabetes (HCC)    Fibromyalgia    25 years ago.   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 Clifton James and Dr. Cornelius Moras   Squamous cell carcinoma     Allergies  Allergen Reactions   Lopressor [Metoprolol Tartrate] Other (See Comments)    Severe BP drop   Lipitor [Atorvastatin]     Muscle aches    Objective:  Bryan Carney is a pleasant 82 y.o. male in NAD. AAO x 3.  Vascular Examination: Patient has palpable DP pulse, absent PT pulse bilateral.  Delayed capillary refill bilateral toes.  Sparse digital hair bilateral.  Proximal to distal cooling WNL bilateral.    Dermatological Examination: Interspaces are clear with no open lesions noted bilateral.  Skin is shiny and atrophic bilateral.  Nails are 3-83mm thick, with yellowish/brown discoloration, subungual debris and distal onycholysis x10.  There is pain with compression of nails x10.  Slight pincer nail to left hallux toenail.  No signs  of paronychia today.  Patient qualifies for at-risk foot care because of diabetes with PVD .  Assessment/Plan: 1. Dermatophytosis of nail   2. Type II diabetes mellitus with peripheral circulatory disorder (HCC)    Mycotic nails x10 were sharply debrided with sterile nail nippers and power debriding burr to decrease bulk and length.  Return in about 3 months (around 08/16/2023) for [facility].   Clerance Lav, DPM, FACFAS [facility]     [address] [place], [place] [address]                Office [phone]  Fax [phone]

## 2023-05-17 ENCOUNTER — Ambulatory Visit: Payer: Medicare HMO | Admitting: Podiatry

## 2023-05-28 ENCOUNTER — Ambulatory Visit (INDEPENDENT_AMBULATORY_CARE_PROVIDER_SITE_OTHER): Payer: Medicare HMO

## 2023-05-28 DIAGNOSIS — G8929 Other chronic pain: Secondary | ICD-10-CM | POA: Diagnosis not present

## 2023-05-28 DIAGNOSIS — M25562 Pain in left knee: Secondary | ICD-10-CM | POA: Diagnosis not present

## 2023-05-28 DIAGNOSIS — I495 Sick sinus syndrome: Secondary | ICD-10-CM

## 2023-05-29 LAB — CUP PACEART REMOTE DEVICE CHECK
Battery Remaining Longevity: 72 mo
Battery Voltage: 2.97 V
Brady Statistic AP VP Percent: 0.07 %
Brady Statistic AP VS Percent: 99.85 %
Brady Statistic AS VP Percent: 0 %
Brady Statistic AS VS Percent: 0.08 %
Brady Statistic RA Percent Paced: 99.6 %
Brady Statistic RV Percent Paced: 0.24 %
Date Time Interrogation Session: 20241203103736
Implantable Lead Connection Status: 753985
Implantable Lead Connection Status: 753985
Implantable Lead Implant Date: 20190905
Implantable Lead Implant Date: 20190905
Implantable Lead Location: 753859
Implantable Lead Location: 753860
Implantable Lead Model: 3830
Implantable Lead Model: 5076
Implantable Pulse Generator Implant Date: 20190905
Lead Channel Impedance Value: 247 Ohm
Lead Channel Impedance Value: 266 Ohm
Lead Channel Impedance Value: 361 Ohm
Lead Channel Impedance Value: 361 Ohm
Lead Channel Pacing Threshold Amplitude: 0.75 V
Lead Channel Pacing Threshold Amplitude: 1.625 V
Lead Channel Pacing Threshold Pulse Width: 0.4 ms
Lead Channel Pacing Threshold Pulse Width: 0.4 ms
Lead Channel Sensing Intrinsic Amplitude: 1.625 mV
Lead Channel Sensing Intrinsic Amplitude: 1.625 mV
Lead Channel Sensing Intrinsic Amplitude: 6.375 mV
Lead Channel Sensing Intrinsic Amplitude: 6.375 mV
Lead Channel Setting Pacing Amplitude: 1.5 V
Lead Channel Setting Pacing Amplitude: 2.5 V
Lead Channel Setting Pacing Pulse Width: 0.8 ms
Lead Channel Setting Sensing Sensitivity: 1.2 mV
Zone Setting Status: 755011
Zone Setting Status: 755011

## 2023-06-06 DIAGNOSIS — Z85828 Personal history of other malignant neoplasm of skin: Secondary | ICD-10-CM | POA: Diagnosis not present

## 2023-06-06 DIAGNOSIS — L905 Scar conditions and fibrosis of skin: Secondary | ICD-10-CM | POA: Diagnosis not present

## 2023-06-06 DIAGNOSIS — L570 Actinic keratosis: Secondary | ICD-10-CM | POA: Diagnosis not present

## 2023-07-04 DIAGNOSIS — I2510 Atherosclerotic heart disease of native coronary artery without angina pectoris: Secondary | ICD-10-CM | POA: Diagnosis not present

## 2023-07-04 DIAGNOSIS — E785 Hyperlipidemia, unspecified: Secondary | ICD-10-CM | POA: Diagnosis not present

## 2023-07-04 DIAGNOSIS — I480 Paroxysmal atrial fibrillation: Secondary | ICD-10-CM | POA: Diagnosis not present

## 2023-07-04 DIAGNOSIS — D539 Nutritional anemia, unspecified: Secondary | ICD-10-CM | POA: Diagnosis not present

## 2023-07-04 DIAGNOSIS — E1121 Type 2 diabetes mellitus with diabetic nephropathy: Secondary | ICD-10-CM | POA: Diagnosis not present

## 2023-07-04 DIAGNOSIS — R053 Chronic cough: Secondary | ICD-10-CM | POA: Diagnosis not present

## 2023-07-04 DIAGNOSIS — N1832 Chronic kidney disease, stage 3b: Secondary | ICD-10-CM | POA: Diagnosis not present

## 2023-07-04 DIAGNOSIS — Z6822 Body mass index (BMI) 22.0-22.9, adult: Secondary | ICD-10-CM | POA: Diagnosis not present

## 2023-07-04 DIAGNOSIS — I10 Essential (primary) hypertension: Secondary | ICD-10-CM | POA: Diagnosis not present

## 2023-08-09 DIAGNOSIS — Z6821 Body mass index (BMI) 21.0-21.9, adult: Secondary | ICD-10-CM | POA: Diagnosis not present

## 2023-08-09 DIAGNOSIS — E1121 Type 2 diabetes mellitus with diabetic nephropathy: Secondary | ICD-10-CM | POA: Diagnosis not present

## 2023-08-27 ENCOUNTER — Ambulatory Visit (INDEPENDENT_AMBULATORY_CARE_PROVIDER_SITE_OTHER): Payer: Medicare HMO

## 2023-08-27 DIAGNOSIS — I495 Sick sinus syndrome: Secondary | ICD-10-CM

## 2023-08-28 LAB — CUP PACEART REMOTE DEVICE CHECK
Battery Remaining Longevity: 68 mo
Battery Voltage: 2.97 V
Brady Statistic AP VP Percent: 0.07 %
Brady Statistic AP VS Percent: 99.63 %
Brady Statistic AS VP Percent: 0 %
Brady Statistic AS VS Percent: 0.3 %
Brady Statistic RA Percent Paced: 98.96 %
Brady Statistic RV Percent Paced: 0.29 %
Date Time Interrogation Session: 20250304163117
Implantable Lead Connection Status: 753985
Implantable Lead Connection Status: 753985
Implantable Lead Implant Date: 20190905
Implantable Lead Implant Date: 20190905
Implantable Lead Location: 753859
Implantable Lead Location: 753860
Implantable Lead Model: 3830
Implantable Lead Model: 5076
Implantable Pulse Generator Implant Date: 20190905
Lead Channel Impedance Value: 266 Ohm
Lead Channel Impedance Value: 323 Ohm
Lead Channel Impedance Value: 361 Ohm
Lead Channel Impedance Value: 399 Ohm
Lead Channel Pacing Threshold Amplitude: 0.875 V
Lead Channel Pacing Threshold Amplitude: 2.125 V
Lead Channel Pacing Threshold Pulse Width: 0.4 ms
Lead Channel Pacing Threshold Pulse Width: 0.4 ms
Lead Channel Sensing Intrinsic Amplitude: 1.625 mV
Lead Channel Sensing Intrinsic Amplitude: 1.625 mV
Lead Channel Sensing Intrinsic Amplitude: 6.75 mV
Lead Channel Sensing Intrinsic Amplitude: 6.75 mV
Lead Channel Setting Pacing Amplitude: 1.75 V
Lead Channel Setting Pacing Amplitude: 2.5 V
Lead Channel Setting Pacing Pulse Width: 0.8 ms
Lead Channel Setting Sensing Sensitivity: 1.2 mV
Zone Setting Status: 755011
Zone Setting Status: 755011

## 2023-09-20 ENCOUNTER — Ambulatory Visit (INDEPENDENT_AMBULATORY_CARE_PROVIDER_SITE_OTHER): Admitting: Podiatry

## 2023-09-20 DIAGNOSIS — M79675 Pain in left toe(s): Secondary | ICD-10-CM | POA: Diagnosis not present

## 2023-09-20 DIAGNOSIS — M79674 Pain in right toe(s): Secondary | ICD-10-CM | POA: Diagnosis not present

## 2023-09-20 DIAGNOSIS — B351 Tinea unguium: Secondary | ICD-10-CM

## 2023-09-20 NOTE — Progress Notes (Signed)
 Subjective:  Patient ID: Bryan Carney, male    DOB: September 27, 1940,  MRN: [id number]   Bryan Carney presents to clinic today for:  Chief Complaint  Patient presents with   [facility]    [facility] with out callous. Last A1c was 6.8, three weeks ago and takes elliquis and ASA 81   Patient notes nails are thick, discolored, elongated and painful in shoegear when trying to ambulate.    PCP is [name], Amy A, NP.  Past Medical History:  Diagnosis Date   Anemia    Arthritis    CAD (coronary artery disease)    Chronic kidney disease    stage 3 per daughter   COPD (chronic obstructive pulmonary disease) (HCC)    Diabetes (HCC)    Fibromyalgia    25 years ago.   History of CVA (cerebrovascular accident)    Hx pulmonary embolism    Hyperlipidemia    Hypertension    Myocardial infarction (HCC)    Pneumonia    Presence of permanent cardiac pacemaker    Prostate cancer (HCC)    PVOD (pulmonary veno-occlusive disease) (HCC)    S/P CABG x 5 10/19/1997   LIMA to LAD, SVG to ramus, SVG to OM2-OM3, SVG to PDA   S/P TAVR (transcatheter aortic valve replacement) 07/12/2020   s/p TAVR with Carney 29 mm Edwards Sapien 3 via the TF approach by Dr. Clifton James and Dr. Cornelius Moras   Squamous cell carcinoma     Past Surgical History:  Procedure Laterality Date   BACK SURGERY     CARDIAC CATHETERIZATION  10/15/1997   Recommend complete revascularization by CABG   CARDIOVASCULAR STRESS TEST  06/12/2012   No evidence of ischemia   CAROTID DOPPLER  08/27/2012   Rt bulb/proximal ICA demonstrated Carney mild-moderate amount of fibrous plaque without evidence of Carney significant diameter reduction or any other vascular abnormality.   CAROTID ENDARTERECTOMY Left 02/23/1995   CORONARY ANGIOPLASTY     CORONARY ARTERY BYPASS GRAFT  10/19/1997   x5. LIMA-LAD, SVG-PDA, SVG-OM1, seq SVG-fist and second branches of ramus intermedius.   HERNIA REPAIR     groin area.   INSERT / REPLACE / REMOVE PACEMAKER     NO PAST SURGERIES     PACEMAKER  IMPLANT N/Carney 02/27/2018   Procedure: PACEMAKER IMPLANT;  Surgeon: Regan Lemming, MD;  Location: [facility] INVASIVE CV LAB;  Service: Cardiovascular;  Laterality: N/Carney;   RIGHT/LEFT HEART CATH AND CORONARY/GRAFT ANGIOGRAPHY N/Carney 05/27/2020   Procedure: RIGHT/LEFT HEART CATH AND CORONARY/GRAFT ANGIOGRAPHY;  Surgeon: Tonny Bollman, MD;  Location: [facility] INVASIVE CV LAB;  Service: Cardiovascular;  Laterality: N/Carney;   TEE WITHOUT CARDIOVERSION N/Carney 05/27/2020   Procedure: TRANSESOPHAGEAL ECHOCARDIOGRAM (TEE);  Surgeon: Thurmon Fair, MD;  Location: [facility] ENDOSCOPY;  Service: Cardiovascular;  Laterality: N/Carney;   TEE WITHOUT CARDIOVERSION N/Carney 07/12/2020   Procedure: TRANSESOPHAGEAL ECHOCARDIOGRAM (TEE);  Surgeon: Kathleene Hazel, MD;  Location: [facility] INVASIVE CV LAB;  Service: Open Heart Surgery;  Laterality: N/Carney;   TRANSCATHETER AORTIC VALVE REPLACEMENT, TRANSFEMORAL N/Carney 07/12/2020   Procedure: TRANSCATHETER AORTIC VALVE REPLACEMENT, TRANSFEMORAL;  Surgeon: Kathleene Hazel, MD;  Location: [facility] INVASIVE CV LAB;  Service: Open Heart Surgery;  Laterality: N/Carney;    Allergies  Allergen Reactions   Lopressor [Metoprolol Tartrate] Other (See Comments)    Severe BP drop   Lipitor [Atorvastatin]     Muscle aches    Review of Systems: Negative except as noted in the HPI.  Objective:  Bryan Longs  L Carney is Carney pleasant 83 y.o. male in NAD. AAO x 3.  Vascular Examination: Capillary refill time is 3-5 seconds to toes bilateral.  Trace palpable pedal pulses b/l LE. Digital hair sparse b/l.  Skin temperature gradient WNL b/l. No varicosities b/l. No cyanosis noted b/l.   Dermatological Examination: Pedal skin with decreased turgor, texture and tone b/l. No open wounds. No interdigital macerations b/l. Toenails x10 are 3mm thick, discolored, dystrophic with subungual debris. There is pain with compression of the nail plates.  They are elongated x10.  Both hallux nails have underlying bruises subungually.     Assessment/Plan: 1. Pain due to onychomycosis of toenails of both feet     The mycotic toenails were sharply debrided x10 with sterile nail nippers and Carney power debriding burr to decrease bulk/thickness and length.    Return in about 3 months (around 12/21/2023) for [facility].   Clerance Lav, DPM, FACFAS [facility]     [address] [place], [place] [address]                Office [phone]  Fax [phone]

## 2023-10-02 NOTE — Addendum Note (Signed)
 Addended by: Geralyn Flash D on: 10/02/2023 03:00 PM   Modules accepted: Orders

## 2023-10-02 NOTE — Progress Notes (Signed)
 Remote pacemaker transmission.

## 2023-10-08 DIAGNOSIS — D539 Nutritional anemia, unspecified: Secondary | ICD-10-CM | POA: Diagnosis not present

## 2023-10-08 DIAGNOSIS — I10 Essential (primary) hypertension: Secondary | ICD-10-CM | POA: Diagnosis not present

## 2023-10-08 DIAGNOSIS — N1832 Chronic kidney disease, stage 3b: Secondary | ICD-10-CM | POA: Diagnosis not present

## 2023-10-08 DIAGNOSIS — I480 Paroxysmal atrial fibrillation: Secondary | ICD-10-CM | POA: Diagnosis not present

## 2023-10-08 DIAGNOSIS — I2510 Atherosclerotic heart disease of native coronary artery without angina pectoris: Secondary | ICD-10-CM | POA: Diagnosis not present

## 2023-10-08 DIAGNOSIS — E1121 Type 2 diabetes mellitus with diabetic nephropathy: Secondary | ICD-10-CM | POA: Diagnosis not present

## 2023-10-08 DIAGNOSIS — Z6821 Body mass index (BMI) 21.0-21.9, adult: Secondary | ICD-10-CM | POA: Diagnosis not present

## 2023-10-08 DIAGNOSIS — R053 Chronic cough: Secondary | ICD-10-CM | POA: Diagnosis not present

## 2023-10-08 DIAGNOSIS — E785 Hyperlipidemia, unspecified: Secondary | ICD-10-CM | POA: Diagnosis not present

## 2023-10-23 DIAGNOSIS — N1832 Chronic kidney disease, stage 3b: Secondary | ICD-10-CM | POA: Diagnosis not present

## 2023-10-23 DIAGNOSIS — E785 Hyperlipidemia, unspecified: Secondary | ICD-10-CM | POA: Diagnosis not present

## 2023-10-23 DIAGNOSIS — E1121 Type 2 diabetes mellitus with diabetic nephropathy: Secondary | ICD-10-CM | POA: Diagnosis not present

## 2023-10-23 DIAGNOSIS — I2510 Atherosclerotic heart disease of native coronary artery without angina pectoris: Secondary | ICD-10-CM | POA: Diagnosis not present

## 2023-10-23 DIAGNOSIS — I10 Essential (primary) hypertension: Secondary | ICD-10-CM | POA: Diagnosis not present

## 2023-11-23 DIAGNOSIS — E785 Hyperlipidemia, unspecified: Secondary | ICD-10-CM | POA: Diagnosis not present

## 2023-11-23 DIAGNOSIS — E1121 Type 2 diabetes mellitus with diabetic nephropathy: Secondary | ICD-10-CM | POA: Diagnosis not present

## 2023-11-23 DIAGNOSIS — N1832 Chronic kidney disease, stage 3b: Secondary | ICD-10-CM | POA: Diagnosis not present

## 2023-11-23 DIAGNOSIS — I10 Essential (primary) hypertension: Secondary | ICD-10-CM | POA: Diagnosis not present

## 2023-11-26 ENCOUNTER — Ambulatory Visit (INDEPENDENT_AMBULATORY_CARE_PROVIDER_SITE_OTHER): Payer: Medicare HMO

## 2023-11-26 DIAGNOSIS — I495 Sick sinus syndrome: Secondary | ICD-10-CM | POA: Diagnosis not present

## 2023-11-28 LAB — CUP PACEART REMOTE DEVICE CHECK
Battery Remaining Longevity: 63 mo
Battery Voltage: 2.96 V
Brady Statistic AP VP Percent: 0.1 %
Brady Statistic AP VS Percent: 99.76 %
Brady Statistic AS VP Percent: 0.03 %
Brady Statistic AS VS Percent: 0.11 %
Brady Statistic RA Percent Paced: 98.45 %
Brady Statistic RV Percent Paced: 0.76 %
Date Time Interrogation Session: 20250605125828
Implantable Lead Connection Status: 753985
Implantable Lead Connection Status: 753985
Implantable Lead Implant Date: 20190905
Implantable Lead Implant Date: 20190905
Implantable Lead Location: 753859
Implantable Lead Location: 753860
Implantable Lead Model: 3830
Implantable Lead Model: 5076
Implantable Pulse Generator Implant Date: 20190905
Lead Channel Impedance Value: 266 Ohm
Lead Channel Impedance Value: 323 Ohm
Lead Channel Impedance Value: 380 Ohm
Lead Channel Impedance Value: 418 Ohm
Lead Channel Pacing Threshold Amplitude: 0.875 V
Lead Channel Pacing Threshold Amplitude: 1.875 V
Lead Channel Pacing Threshold Pulse Width: 0.4 ms
Lead Channel Pacing Threshold Pulse Width: 0.4 ms
Lead Channel Sensing Intrinsic Amplitude: 1.5 mV
Lead Channel Sensing Intrinsic Amplitude: 1.5 mV
Lead Channel Sensing Intrinsic Amplitude: 6.25 mV
Lead Channel Sensing Intrinsic Amplitude: 6.25 mV
Lead Channel Setting Pacing Amplitude: 1.75 V
Lead Channel Setting Pacing Amplitude: 2.5 V
Lead Channel Setting Pacing Pulse Width: 0.8 ms
Lead Channel Setting Sensing Sensitivity: 1.2 mV
Zone Setting Status: 755011
Zone Setting Status: 755011

## 2023-12-03 ENCOUNTER — Ambulatory Visit: Payer: Self-pay | Admitting: Cardiology

## 2023-12-17 DIAGNOSIS — D044 Carcinoma in situ of skin of scalp and neck: Secondary | ICD-10-CM | POA: Diagnosis not present

## 2023-12-17 DIAGNOSIS — L570 Actinic keratosis: Secondary | ICD-10-CM | POA: Diagnosis not present

## 2023-12-17 DIAGNOSIS — Z85828 Personal history of other malignant neoplasm of skin: Secondary | ICD-10-CM | POA: Diagnosis not present

## 2023-12-17 DIAGNOSIS — D485 Neoplasm of uncertain behavior of skin: Secondary | ICD-10-CM | POA: Diagnosis not present

## 2023-12-23 DIAGNOSIS — I2510 Atherosclerotic heart disease of native coronary artery without angina pectoris: Secondary | ICD-10-CM | POA: Diagnosis not present

## 2023-12-23 DIAGNOSIS — I10 Essential (primary) hypertension: Secondary | ICD-10-CM | POA: Diagnosis not present

## 2023-12-23 DIAGNOSIS — E785 Hyperlipidemia, unspecified: Secondary | ICD-10-CM | POA: Diagnosis not present

## 2023-12-23 DIAGNOSIS — E1121 Type 2 diabetes mellitus with diabetic nephropathy: Secondary | ICD-10-CM | POA: Diagnosis not present

## 2023-12-23 DIAGNOSIS — N1832 Chronic kidney disease, stage 3b: Secondary | ICD-10-CM | POA: Diagnosis not present

## 2024-01-23 DIAGNOSIS — E785 Hyperlipidemia, unspecified: Secondary | ICD-10-CM | POA: Diagnosis not present

## 2024-01-23 DIAGNOSIS — E1121 Type 2 diabetes mellitus with diabetic nephropathy: Secondary | ICD-10-CM | POA: Diagnosis not present

## 2024-01-23 DIAGNOSIS — I10 Essential (primary) hypertension: Secondary | ICD-10-CM | POA: Diagnosis not present

## 2024-01-23 DIAGNOSIS — N1832 Chronic kidney disease, stage 3b: Secondary | ICD-10-CM | POA: Diagnosis not present

## 2024-01-23 NOTE — Addendum Note (Signed)
 Addended by: VICCI SELLER A on: 01/23/2024 09:43 AM   Modules accepted: Orders

## 2024-01-23 NOTE — Progress Notes (Signed)
 Remote pacemaker transmission.

## 2024-01-28 DIAGNOSIS — I2510 Atherosclerotic heart disease of native coronary artery without angina pectoris: Secondary | ICD-10-CM | POA: Diagnosis not present

## 2024-01-28 DIAGNOSIS — I480 Paroxysmal atrial fibrillation: Secondary | ICD-10-CM | POA: Diagnosis not present

## 2024-01-28 DIAGNOSIS — C4442 Squamous cell carcinoma of skin of scalp and neck: Secondary | ICD-10-CM | POA: Diagnosis not present

## 2024-01-28 DIAGNOSIS — N1832 Chronic kidney disease, stage 3b: Secondary | ICD-10-CM | POA: Diagnosis not present

## 2024-01-28 DIAGNOSIS — E1121 Type 2 diabetes mellitus with diabetic nephropathy: Secondary | ICD-10-CM | POA: Diagnosis not present

## 2024-01-28 DIAGNOSIS — R053 Chronic cough: Secondary | ICD-10-CM | POA: Diagnosis not present

## 2024-01-28 DIAGNOSIS — E785 Hyperlipidemia, unspecified: Secondary | ICD-10-CM | POA: Diagnosis not present

## 2024-01-28 DIAGNOSIS — I10 Essential (primary) hypertension: Secondary | ICD-10-CM | POA: Diagnosis not present

## 2024-01-28 DIAGNOSIS — D539 Nutritional anemia, unspecified: Secondary | ICD-10-CM | POA: Diagnosis not present

## 2024-01-28 DIAGNOSIS — Z6822 Body mass index (BMI) 22.0-22.9, adult: Secondary | ICD-10-CM | POA: Diagnosis not present

## 2024-02-07 ENCOUNTER — Ambulatory Visit (INDEPENDENT_AMBULATORY_CARE_PROVIDER_SITE_OTHER): Admitting: Podiatry

## 2024-02-07 DIAGNOSIS — M79675 Pain in left toe(s): Secondary | ICD-10-CM

## 2024-02-07 DIAGNOSIS — B351 Tinea unguium: Secondary | ICD-10-CM

## 2024-02-07 DIAGNOSIS — M79674 Pain in right toe(s): Secondary | ICD-10-CM | POA: Diagnosis not present

## 2024-02-07 NOTE — Progress Notes (Signed)
 Subjective:  Patient ID: Bryan Carney, male    DOB: [birth year],  MRN: [id number]   ASTON Bryan Carney presents to clinic today for:  Chief Complaint  Patient presents with   [place] [facility]    Bryan Carney with Carney possible callous on the lateral sides Sub met  5.  A1c was 7.1 in Aug Eliquis  and ASA   Patient notes nails are thick, discolored, elongated and painful in shoegear when trying to ambulate.  No calluses are present.  PCP is [name], Amy A, NP.  Past Medical History:  Diagnosis Date   Anemia    Arthritis    CAD (coronary artery disease)    Chronic kidney disease    stage 3 per daughter   COPD (chronic obstructive pulmonary disease) (HCC)    Diabetes (HCC)    Fibromyalgia    25 years ago.   History of CVA (cerebrovascular accident)    Hx pulmonary embolism    Hyperlipidemia    Hypertension    Myocardial infarction (HCC)    Pneumonia    Presence of permanent cardiac pacemaker    Prostate cancer (HCC)    PVOD (pulmonary veno-occlusive disease) (HCC)    S/P CABG x 5 10/19/1997   LIMA to LAD, SVG to ramus, SVG to OM2-OM3, SVG to PDA   S/P TAVR (transcatheter aortic valve replacement) 07/12/2020   s/p TAVR with Carney 29 mm Edwards Sapien 3 via the TF approach by Dr. Verlin and Dr. Dusty   Squamous cell carcinoma     Past Surgical History:  Procedure Laterality Date   BACK SURGERY     CARDIAC CATHETERIZATION  10/15/1997   Recommend complete revascularization by CABG   CARDIOVASCULAR STRESS TEST  06/12/2012   No evidence of ischemia   CAROTID DOPPLER  08/27/2012   Rt bulb/proximal ICA demonstrated Carney mild-moderate amount of fibrous plaque without evidence of Carney significant diameter reduction or any other vascular abnormality.   CAROTID ENDARTERECTOMY Left 02/23/1995   CORONARY ANGIOPLASTY     CORONARY ARTERY BYPASS GRAFT  10/19/1997   x5. LIMA-LAD, SVG-PDA, SVG-OM1, seq SVG-fist and second branches of ramus intermedius.   HERNIA REPAIR     groin area.   INSERT / REPLACE / REMOVE PACEMAKER      NO PAST SURGERIES     PACEMAKER IMPLANT N/Carney 02/27/2018   Procedure: PACEMAKER IMPLANT;  Surgeon: Inocencio Soyla Lunger, MD;  Location: [facility] INVASIVE CV LAB;  Service: Cardiovascular;  Laterality: N/Carney;   RIGHT/LEFT HEART CATH AND CORONARY/GRAFT ANGIOGRAPHY N/Carney 05/27/2020   Procedure: RIGHT/LEFT HEART CATH AND CORONARY/GRAFT ANGIOGRAPHY;  Surgeon: Wonda Sharper, MD;  Location[facility] INVASIVE CV LAB;  Service: Cardiovascular;  Laterality: N/Carney;   TEE WITHOUT CARDIOVERSION N/Carney 05/27/2020   Procedure: TRANSESOPHAGEAL ECHOCARDIOGRAM (TEE);  Surgeon: Francyne Headland, MD;  Location: [facility] ENDOSCOPY;  Service: Cardiovascular;  Laterality: N/Carney;   TEE WITHOUT CARDIOVERSION N/Carney 07/12/2020   Procedure: TRANSESOPHAGEAL ECHOCARDIOGRAM (TEE);  Surgeon: Verlin Lonni BIRCH, MD;  Location: [facility] INVASIVE CV LAB;  Service: Open Heart Surgery;  Laterality: N/Carney;   TRANSCATHETER AORTIC VALVE REPLACEMENT, TRANSFEMORAL N/Carney 07/12/2020   Procedure: TRANSCATHETER AORTIC VALVE REPLACEMENT, TRANSFEMORAL;  Surgeon: Verlin Lonni BIRCH, MD;  Location: [facility] INVASIVE CV LAB;  Service: Open Heart Surgery;  Laterality: N/Carney;    Allergies  Allergen Reactions   Lopressor  [Metoprolol  Tartrate] Other (See Comments)    Severe BP drop   Lipitor [Atorvastatin ]     Muscle aches    Review of Systems: Negative except as  noted in the HPI.  Objective:  Bryan Carney is Carney pleasant 83 y.o. male in NAD. AAO x 3.  Vascular Examination: Capillary refill time is 3-5 seconds to toes bilateral.  Trace palpable pedal pulses b/l LE. Digital hair sparse b/l.  Skin temperature gradient WNL b/l. No varicosities b/l. No cyanosis noted b/l.   Dermatological Examination: Pedal skin with decreased turgor, texture and tone b/l. No open wounds. No interdigital macerations b/l. Toenails x10 are 3mm thick, discolored, dystrophic with subungual debris. There is pain with compression of the nail plates.  They are elongated x10.    Assessment/Plan: 1. Pain due  to onychomycosis of toenails of both feet     The mycotic toenails were sharply debrided x10 with sterile nail nippers and Carney power debriding burr to decrease bulk/thickness and length.    Return in about 3 months (around 05/09/2024) for Bryan Carney And Bryan Carney.   Bryan Carney [place], DPM, FACFAS [facility]     [address] [place], [place] [address]                Office [phone]  Fax [phone]

## 2024-02-23 DIAGNOSIS — I10 Essential (primary) hypertension: Secondary | ICD-10-CM | POA: Diagnosis not present

## 2024-02-23 DIAGNOSIS — N1832 Chronic kidney disease, stage 3b: Secondary | ICD-10-CM | POA: Diagnosis not present

## 2024-02-23 DIAGNOSIS — E1121 Type 2 diabetes mellitus with diabetic nephropathy: Secondary | ICD-10-CM | POA: Diagnosis not present

## 2024-02-23 DIAGNOSIS — E785 Hyperlipidemia, unspecified: Secondary | ICD-10-CM | POA: Diagnosis not present

## 2024-02-25 ENCOUNTER — Ambulatory Visit (INDEPENDENT_AMBULATORY_CARE_PROVIDER_SITE_OTHER)

## 2024-02-25 DIAGNOSIS — I495 Sick sinus syndrome: Secondary | ICD-10-CM

## 2024-02-26 LAB — CUP PACEART REMOTE DEVICE CHECK
Battery Remaining Longevity: 61 mo
Battery Voltage: 2.96 V
Brady Statistic AP VP Percent: 0.11 %
Brady Statistic AP VS Percent: 99.75 %
Brady Statistic AS VP Percent: 0.04 %
Brady Statistic AS VS Percent: 0.11 %
Brady Statistic RA Percent Paced: 98.52 %
Brady Statistic RV Percent Paced: 0.5 %
Date Time Interrogation Session: 20250902065608
Implantable Lead Connection Status: 753985
Implantable Lead Connection Status: 753985
Implantable Lead Implant Date: 20190905
Implantable Lead Implant Date: 20190905
Implantable Lead Location: 753859
Implantable Lead Location: 753860
Implantable Lead Model: 3830
Implantable Lead Model: 5076
Implantable Pulse Generator Implant Date: 20190905
Lead Channel Impedance Value: 247 Ohm
Lead Channel Impedance Value: 285 Ohm
Lead Channel Impedance Value: 361 Ohm
Lead Channel Impedance Value: 361 Ohm
Lead Channel Pacing Threshold Amplitude: 0.75 V
Lead Channel Pacing Threshold Amplitude: 1.875 V
Lead Channel Pacing Threshold Pulse Width: 0.4 ms
Lead Channel Pacing Threshold Pulse Width: 0.4 ms
Lead Channel Sensing Intrinsic Amplitude: 0.625 mV
Lead Channel Sensing Intrinsic Amplitude: 0.625 mV
Lead Channel Sensing Intrinsic Amplitude: 6.375 mV
Lead Channel Sensing Intrinsic Amplitude: 6.375 mV
Lead Channel Setting Pacing Amplitude: 1.5 V
Lead Channel Setting Pacing Amplitude: 2.5 V
Lead Channel Setting Pacing Pulse Width: 0.8 ms
Lead Channel Setting Sensing Sensitivity: 1.2 mV
Zone Setting Status: 755011
Zone Setting Status: 755011

## 2024-03-01 ENCOUNTER — Ambulatory Visit: Payer: Self-pay | Admitting: Cardiology

## 2024-03-04 NOTE — Progress Notes (Signed)
 Remote PPM Transmission

## 2024-03-24 DIAGNOSIS — E1121 Type 2 diabetes mellitus with diabetic nephropathy: Secondary | ICD-10-CM | POA: Diagnosis not present

## 2024-03-24 DIAGNOSIS — N1832 Chronic kidney disease, stage 3b: Secondary | ICD-10-CM | POA: Diagnosis not present

## 2024-03-24 DIAGNOSIS — E785 Hyperlipidemia, unspecified: Secondary | ICD-10-CM | POA: Diagnosis not present

## 2024-03-24 DIAGNOSIS — I10 Essential (primary) hypertension: Secondary | ICD-10-CM | POA: Diagnosis not present

## 2024-03-29 ENCOUNTER — Emergency Department (HOSPITAL_COMMUNITY)

## 2024-03-29 ENCOUNTER — Inpatient Hospital Stay (HOSPITAL_COMMUNITY)
Admission: EM | Admit: 2024-03-29 | Discharge: 2024-04-07 | DRG: 393 | Disposition: A | Discharge status: Home / Self Care | Attending: Family Medicine | Admitting: Family Medicine

## 2024-03-29 ENCOUNTER — Encounter (HOSPITAL_COMMUNITY): Payer: Self-pay

## 2024-03-29 ENCOUNTER — Other Ambulatory Visit: Payer: Self-pay

## 2024-03-29 DIAGNOSIS — K449 Diaphragmatic hernia without obstruction or gangrene: Secondary | ICD-10-CM | POA: Diagnosis not present

## 2024-03-29 DIAGNOSIS — E785 Hyperlipidemia, unspecified: Secondary | ICD-10-CM | POA: Diagnosis present

## 2024-03-29 DIAGNOSIS — B9620 Unspecified Escherichia coli [E. coli] as the cause of diseases classified elsewhere: Secondary | ICD-10-CM | POA: Diagnosis not present

## 2024-03-29 DIAGNOSIS — R059 Cough, unspecified: Secondary | ICD-10-CM | POA: Diagnosis not present

## 2024-03-29 DIAGNOSIS — K5732 Diverticulitis of large intestine without perforation or abscess without bleeding: Secondary | ICD-10-CM | POA: Diagnosis present

## 2024-03-29 DIAGNOSIS — K250 Acute gastric ulcer with hemorrhage: Principal | ICD-10-CM | POA: Diagnosis present

## 2024-03-29 DIAGNOSIS — K254 Chronic or unspecified gastric ulcer with hemorrhage: Secondary | ICD-10-CM | POA: Diagnosis not present

## 2024-03-29 DIAGNOSIS — J449 Chronic obstructive pulmonary disease, unspecified: Secondary | ICD-10-CM | POA: Diagnosis present

## 2024-03-29 DIAGNOSIS — E1122 Type 2 diabetes mellitus with diabetic chronic kidney disease: Secondary | ICD-10-CM | POA: Diagnosis present

## 2024-03-29 DIAGNOSIS — K317 Polyp of stomach and duodenum: Secondary | ICD-10-CM | POA: Diagnosis not present

## 2024-03-29 DIAGNOSIS — S199XXA Unspecified injury of neck, initial encounter: Secondary | ICD-10-CM | POA: Diagnosis not present

## 2024-03-29 DIAGNOSIS — E1165 Type 2 diabetes mellitus with hyperglycemia: Secondary | ICD-10-CM | POA: Diagnosis present

## 2024-03-29 DIAGNOSIS — K920 Hematemesis: Principal | ICD-10-CM

## 2024-03-29 DIAGNOSIS — R110 Nausea: Secondary | ICD-10-CM | POA: Diagnosis not present

## 2024-03-29 DIAGNOSIS — I2510 Atherosclerotic heart disease of native coronary artery without angina pectoris: Secondary | ICD-10-CM | POA: Diagnosis present

## 2024-03-29 DIAGNOSIS — Z833 Family history of diabetes mellitus: Secondary | ICD-10-CM

## 2024-03-29 DIAGNOSIS — T182XXA Foreign body in stomach, initial encounter: Secondary | ICD-10-CM | POA: Diagnosis not present

## 2024-03-29 DIAGNOSIS — D649 Anemia, unspecified: Secondary | ICD-10-CM | POA: Diagnosis not present

## 2024-03-29 DIAGNOSIS — Z953 Presence of xenogenic heart valve: Secondary | ICD-10-CM

## 2024-03-29 DIAGNOSIS — R0989 Other specified symptoms and signs involving the circulatory and respiratory systems: Secondary | ICD-10-CM | POA: Diagnosis not present

## 2024-03-29 DIAGNOSIS — Z7982 Long term (current) use of aspirin: Secondary | ICD-10-CM

## 2024-03-29 DIAGNOSIS — K922 Gastrointestinal hemorrhage, unspecified: Secondary | ICD-10-CM | POA: Diagnosis present

## 2024-03-29 DIAGNOSIS — K259 Gastric ulcer, unspecified as acute or chronic, without hemorrhage or perforation: Secondary | ICD-10-CM | POA: Diagnosis not present

## 2024-03-29 DIAGNOSIS — K3189 Other diseases of stomach and duodenum: Secondary | ICD-10-CM | POA: Diagnosis not present

## 2024-03-29 DIAGNOSIS — Z743 Need for continuous supervision: Secondary | ICD-10-CM | POA: Diagnosis not present

## 2024-03-29 DIAGNOSIS — I5032 Chronic diastolic (congestive) heart failure: Secondary | ICD-10-CM | POA: Diagnosis present

## 2024-03-29 DIAGNOSIS — R55 Syncope and collapse: Secondary | ICD-10-CM | POA: Diagnosis not present

## 2024-03-29 DIAGNOSIS — M797 Fibromyalgia: Secondary | ICD-10-CM | POA: Diagnosis present

## 2024-03-29 DIAGNOSIS — Z86711 Personal history of pulmonary embolism: Secondary | ICD-10-CM

## 2024-03-29 DIAGNOSIS — S0990XA Unspecified injury of head, initial encounter: Secondary | ICD-10-CM | POA: Diagnosis not present

## 2024-03-29 DIAGNOSIS — Z950 Presence of cardiac pacemaker: Secondary | ICD-10-CM

## 2024-03-29 DIAGNOSIS — N390 Urinary tract infection, site not specified: Secondary | ICD-10-CM | POA: Diagnosis not present

## 2024-03-29 DIAGNOSIS — E876 Hypokalemia: Secondary | ICD-10-CM | POA: Diagnosis present

## 2024-03-29 DIAGNOSIS — I495 Sick sinus syndrome: Secondary | ICD-10-CM | POA: Diagnosis present

## 2024-03-29 DIAGNOSIS — I252 Old myocardial infarction: Secondary | ICD-10-CM

## 2024-03-29 DIAGNOSIS — R1110 Vomiting, unspecified: Secondary | ICD-10-CM | POA: Diagnosis not present

## 2024-03-29 DIAGNOSIS — Z955 Presence of coronary angioplasty implant and graft: Secondary | ICD-10-CM

## 2024-03-29 DIAGNOSIS — Z951 Presence of aortocoronary bypass graft: Secondary | ICD-10-CM

## 2024-03-29 DIAGNOSIS — Z8249 Family history of ischemic heart disease and other diseases of the circulatory system: Secondary | ICD-10-CM

## 2024-03-29 DIAGNOSIS — Z809 Family history of malignant neoplasm, unspecified: Secondary | ICD-10-CM

## 2024-03-29 DIAGNOSIS — Z7901 Long term (current) use of anticoagulants: Secondary | ICD-10-CM | POA: Diagnosis not present

## 2024-03-29 DIAGNOSIS — Z7984 Long term (current) use of oral hypoglycemic drugs: Secondary | ICD-10-CM

## 2024-03-29 DIAGNOSIS — D62 Acute posthemorrhagic anemia: Secondary | ICD-10-CM | POA: Diagnosis present

## 2024-03-29 DIAGNOSIS — I701 Atherosclerosis of renal artery: Secondary | ICD-10-CM | POA: Diagnosis not present

## 2024-03-29 DIAGNOSIS — R9389 Abnormal findings on diagnostic imaging of other specified body structures: Secondary | ICD-10-CM | POA: Diagnosis not present

## 2024-03-29 DIAGNOSIS — R509 Fever, unspecified: Secondary | ICD-10-CM | POA: Diagnosis not present

## 2024-03-29 DIAGNOSIS — I25119 Atherosclerotic heart disease of native coronary artery with unspecified angina pectoris: Secondary | ICD-10-CM | POA: Diagnosis not present

## 2024-03-29 DIAGNOSIS — I10 Essential (primary) hypertension: Secondary | ICD-10-CM | POA: Diagnosis not present

## 2024-03-29 DIAGNOSIS — I130 Hypertensive heart and chronic kidney disease with heart failure and stage 1 through stage 4 chronic kidney disease, or unspecified chronic kidney disease: Secondary | ICD-10-CM | POA: Diagnosis present

## 2024-03-29 DIAGNOSIS — I6782 Cerebral ischemia: Secondary | ICD-10-CM | POA: Diagnosis not present

## 2024-03-29 DIAGNOSIS — I480 Paroxysmal atrial fibrillation: Secondary | ICD-10-CM | POA: Diagnosis present

## 2024-03-29 DIAGNOSIS — Z85828 Personal history of other malignant neoplasm of skin: Secondary | ICD-10-CM

## 2024-03-29 DIAGNOSIS — B961 Klebsiella pneumoniae [K. pneumoniae] as the cause of diseases classified elsewhere: Secondary | ICD-10-CM | POA: Diagnosis not present

## 2024-03-29 DIAGNOSIS — N3000 Acute cystitis without hematuria: Secondary | ICD-10-CM | POA: Diagnosis not present

## 2024-03-29 DIAGNOSIS — N183 Chronic kidney disease, stage 3 unspecified: Secondary | ICD-10-CM | POA: Diagnosis present

## 2024-03-29 DIAGNOSIS — Z8673 Personal history of transient ischemic attack (TIA), and cerebral infarction without residual deficits: Secondary | ICD-10-CM

## 2024-03-29 DIAGNOSIS — Z8546 Personal history of malignant neoplasm of prostate: Secondary | ICD-10-CM

## 2024-03-29 DIAGNOSIS — E872 Acidosis, unspecified: Secondary | ICD-10-CM | POA: Diagnosis present

## 2024-03-29 DIAGNOSIS — Z79899 Other long term (current) drug therapy: Secondary | ICD-10-CM

## 2024-03-29 DIAGNOSIS — Z888 Allergy status to other drugs, medicaments and biological substances status: Secondary | ICD-10-CM

## 2024-03-29 DIAGNOSIS — I959 Hypotension, unspecified: Secondary | ICD-10-CM | POA: Diagnosis present

## 2024-03-29 DIAGNOSIS — G319 Degenerative disease of nervous system, unspecified: Secondary | ICD-10-CM | POA: Diagnosis not present

## 2024-03-29 LAB — COMPREHENSIVE METABOLIC PANEL WITH GFR
ALT: 15 U/L (ref 0–44)
AST: 19 U/L (ref 15–41)
Albumin: 2.6 g/dL — ABNORMAL LOW (ref 3.5–5.0)
Alkaline Phosphatase: 54 U/L (ref 38–126)
Anion gap: 11 (ref 5–15)
BUN: 64 mg/dL — ABNORMAL HIGH (ref 8–23)
CO2: 21 mmol/L — ABNORMAL LOW (ref 22–32)
Calcium: 8.2 mg/dL — ABNORMAL LOW (ref 8.9–10.3)
Chloride: 108 mmol/L (ref 98–111)
Creatinine, Ser: 1.93 mg/dL — ABNORMAL HIGH (ref 0.61–1.24)
GFR, Estimated: 34 mL/min — ABNORMAL LOW (ref >60)
Glucose, Bld: 189 mg/dL — ABNORMAL HIGH (ref 70–99)
Potassium: 3.8 mmol/L (ref 3.5–5.1)
Sodium: 140 mmol/L (ref 135–145)
Total Bilirubin: 0.8 mg/dL (ref 0.0–1.2)
Total Protein: 4.5 g/dL — ABNORMAL LOW (ref 6.5–8.1)

## 2024-03-29 LAB — CBC
HCT: 21.9 % — ABNORMAL LOW (ref 39.0–52.0)
HCT: 20.5 % — ABNORMAL LOW (ref 39.0–52.0)
Hemoglobin: 7 g/dL — ABNORMAL LOW (ref 13.0–17.0)
Hemoglobin: 6.6 g/dL — CL (ref 13.0–17.0)
MCH: 33.3 pg (ref 26.0–34.0)
MCH: 32.4 pg (ref 26.0–34.0)
MCHC: 32 g/dL (ref 30.0–36.0)
MCHC: 32.2 g/dL (ref 30.0–36.0)
MCV: 104.3 fL — ABNORMAL HIGH (ref 80.0–100.0)
MCV: 100.5 fL — ABNORMAL HIGH (ref 80.0–100.0)
Platelets: 175 K/uL (ref 150–400)
Platelets: 115 K/uL — ABNORMAL LOW (ref 150–400)
RBC: 2.1 MIL/uL — ABNORMAL LOW (ref 4.22–5.81)
RBC: 2.04 MIL/uL — ABNORMAL LOW (ref 4.22–5.81)
RDW: 13 % (ref 11.5–15.5)
RDW: 16.1 % — ABNORMAL HIGH (ref 11.5–15.5)
WBC: 9.9 K/uL (ref 4.0–10.5)
WBC: 8.3 K/uL (ref 4.0–10.5)
nRBC: 0 % (ref 0.0–0.2)
nRBC: 0 % (ref 0.0–0.2)

## 2024-03-29 LAB — HEMOGLOBIN AND HEMATOCRIT, BLOOD
HCT: 21.8 % — ABNORMAL LOW (ref 39.0–52.0)
Hemoglobin: 7.1 g/dL — ABNORMAL LOW (ref 13.0–17.0)

## 2024-03-29 LAB — PROTIME-INR
INR: 1.3 — ABNORMAL HIGH (ref 0.8–1.2)
Prothrombin Time: 16.5 s — ABNORMAL HIGH (ref 11.4–15.2)

## 2024-03-29 LAB — HEMOGLOBIN A1C
Hgb A1c MFr Bld: 6.8 % — ABNORMAL HIGH (ref 4.8–5.6)
Mean Plasma Glucose: 148.46 mg/dL

## 2024-03-29 LAB — CBG MONITORING, ED: Glucose-Capillary: 225 mg/dL — ABNORMAL HIGH (ref 70–99)

## 2024-03-29 LAB — PREPARE RBC (CROSSMATCH)

## 2024-03-29 LAB — GLUCOSE, CAPILLARY
Glucose-Capillary: 123 mg/dL — ABNORMAL HIGH (ref 70–99)
Glucose-Capillary: 142 mg/dL — ABNORMAL HIGH (ref 70–99)

## 2024-03-29 LAB — TROPONIN I (HIGH SENSITIVITY): Troponin I (High Sensitivity): 59 ng/L — ABNORMAL HIGH (ref <18)

## 2024-03-29 LAB — I-STAT CG4 LACTIC ACID, ED
Lactic Acid, Venous: 3 mmol/L (ref 0.5–1.9)
Lactic Acid, Venous: 1.7 mmol/L (ref 0.5–1.9)

## 2024-03-29 MED ORDER — PANTOPRAZOLE SODIUM 40 MG IV SOLR
40.0000 mg | Freq: Two times a day (BID) | INTRAVENOUS | Status: DC
  Administered 2024-03-29 – 2024-04-04 (×12): 40 mg via INTRAVENOUS
  Filled 2024-03-29 (×13): qty 10

## 2024-03-29 MED ORDER — ONDANSETRON HCL 4 MG/2ML IJ SOLN
4.0000 mg | Freq: Once | INTRAMUSCULAR | Status: AC
  Administered 2024-03-29: 4 mg via INTRAVENOUS
  Filled 2024-03-29: qty 2

## 2024-03-29 MED ORDER — ACETAMINOPHEN 325 MG PO TABS: 650.0000 mg | ORAL_TABLET | Freq: Four times a day (QID) | ORAL | Status: DC | PRN

## 2024-03-29 MED ORDER — SODIUM CHLORIDE 0.9% IV SOLUTION: Freq: Once | INTRAVENOUS | Status: DC

## 2024-03-29 MED ORDER — SENNOSIDES-DOCUSATE SODIUM 8.6-50 MG PO TABS
2.0000 | ORAL_TABLET | Freq: Two times a day (BID) | ORAL | Status: DC
  Administered 2024-03-29 – 2024-04-07 (×13): 2 via ORAL
  Filled 2024-03-29 (×16): qty 2

## 2024-03-29 MED ORDER — SODIUM CHLORIDE 0.9 % IV SOLN: INTRAVENOUS | Status: AC

## 2024-03-29 MED ORDER — ACETAMINOPHEN 650 MG RE SUPP: 650.0000 mg | Freq: Four times a day (QID) | RECTAL | Status: DC | PRN

## 2024-03-29 MED ORDER — AMIODARONE HCL 200 MG PO TABS
200.0000 mg | ORAL_TABLET | Freq: Every day | ORAL | Status: DC
  Administered 2024-03-29 – 2024-04-07 (×10): 200 mg via ORAL
  Filled 2024-03-29 (×10): qty 1

## 2024-03-29 MED ORDER — SODIUM CHLORIDE 0.9 % IV BOLUS
1000.0000 mL | Freq: Once | INTRAVENOUS | Status: AC
  Administered 2024-03-29: 1000 mL via INTRAVENOUS

## 2024-03-29 MED ORDER — SODIUM CHLORIDE 0.9% IV SOLUTION: Freq: Once | INTRAVENOUS | Status: AC

## 2024-03-29 MED ORDER — ROSUVASTATIN CALCIUM 5 MG PO TABS
10.0000 mg | ORAL_TABLET | Freq: Every day | ORAL | Status: DC
Start: 2024-03-29 — End: 2024-04-07
  Administered 2024-03-29 – 2024-04-07 (×10): 10 mg via ORAL
  Filled 2024-03-29 (×10): qty 2

## 2024-03-29 MED ORDER — PIPERACILLIN-TAZOBACTAM 3.375 G IVPB 30 MIN
3.3750 g | Freq: Once | INTRAVENOUS | Status: AC
  Administered 2024-03-29: 3.375 g via INTRAVENOUS
  Filled 2024-03-29: qty 50

## 2024-03-29 MED ORDER — IOHEXOL 350 MG/ML SOLN
90.0000 mL | Freq: Once | INTRAVENOUS | Status: AC | PRN
  Administered 2024-03-29: 90 mL via INTRAVENOUS

## 2024-03-29 MED ORDER — PANTOPRAZOLE SODIUM 40 MG IV SOLR
40.0000 mg | INTRAVENOUS | Status: AC
  Administered 2024-03-29 (×2): 40 mg via INTRAVENOUS
  Filled 2024-03-29: qty 10

## 2024-03-29 MED ORDER — POLYETHYLENE GLYCOL 3350 17 G PO PACK
17.0000 g | PACK | Freq: Two times a day (BID) | ORAL | Status: DC
  Administered 2024-03-29 – 2024-04-06 (×8): 17 g via ORAL
  Filled 2024-03-29 (×13): qty 1

## 2024-03-29 MED ORDER — GLIMEPIRIDE 4 MG PO TABS
4.0000 mg | ORAL_TABLET | Freq: Every day | ORAL | Status: DC
  Administered 2024-03-30 – 2024-04-01 (×2): 4 mg via ORAL
  Filled 2024-03-29 (×3): qty 1

## 2024-03-29 MED ORDER — INSULIN ASPART 100 UNIT/ML IJ SOLN
0.0000 [IU] | Freq: Three times a day (TID) | INTRAMUSCULAR | Status: DC
  Administered 2024-03-29: 5 [IU] via SUBCUTANEOUS
  Administered 2024-03-30: 11 [IU] via SUBCUTANEOUS
  Administered 2024-03-30: 5 [IU] via SUBCUTANEOUS
  Administered 2024-03-30: 2 [IU] via SUBCUTANEOUS
  Administered 2024-03-31: 5 [IU] via SUBCUTANEOUS
  Administered 2024-04-01 – 2024-04-02 (×2): 3 [IU] via SUBCUTANEOUS
  Administered 2024-04-02: 2 [IU] via SUBCUTANEOUS
  Administered 2024-04-03: 11 [IU] via SUBCUTANEOUS
  Administered 2024-04-04: 3 [IU] via SUBCUTANEOUS
  Administered 2024-04-04: 8 [IU] via SUBCUTANEOUS
  Administered 2024-04-05: 3 [IU] via SUBCUTANEOUS
  Administered 2024-04-05: 2 [IU] via SUBCUTANEOUS
  Administered 2024-04-05 – 2024-04-06 (×2): 5 [IU] via SUBCUTANEOUS
  Administered 2024-04-06 – 2024-04-07 (×2): 3 [IU] via SUBCUTANEOUS
  Administered 2024-04-07: 2 [IU] via SUBCUTANEOUS

## 2024-03-29 NOTE — ED Notes (Signed)
 CCMD called to verify pt on the monitor.

## 2024-03-29 NOTE — ED Triage Notes (Signed)
 Pt to ED via [facility] from home. Pt had emesis of dark red blood x 2 tonight. Pt has been feeling bad with low fever for past few days and fsbs in 400s yesterday.  Does take eliquis .  22g L wrist.   EMS VS 86/54 Paced at 74bpm 98% RA Cbg 177

## 2024-03-29 NOTE — ED Provider Notes (Signed)
 [facility] AT [facility] Provider Note   CSN: [id number] Arrival date & time: 03/29/24  0354     Patient presents with: Hematemesis   Bryan Carney is a 83 y.o. male.   HPI   83 year old male with medical history significant for HTN, HLD, carotid artery disease, DM 2, atrial fibrillation on Eliquis , CAD status post CABG, PPM in place, CVA, history of TAVR presenting to the emergency department with concern for syncopal episode and hematemesis.  The patient had an episode of syncope last night upon standing up from bed falling backward striking his right ear.  He was unresponsive for short period and then immediately was back to baseline, no seizure-like activity.  He has been more pale compared to usual and has been feeling fatigued and lightheaded.  No chest pain or shortness of breath.  Today he had 2 episodes of dark blood tonight, describes around 300 cc by EMS.  His blood glucoses have been in the 400s yesterday but normalized after taking his medications.  He is on Eliquis  but did not take any dose of the medication today, last took the medication last over 24 hours ago.  Prior to Admission medications   Medication Sig Start Date End Date Taking? Authorizing Provider  amiodarone  (PACERONE ) 200 MG tablet TAKE ONE TABLET BY MOUTH DAILY. Please make overdue appt with Dr. Inocencio before anymore refills. Thank you 3rd and Final attempt 09/26/21  Yes Camnitz, Soyla Lunger, MD  apixaban  (ELIQUIS ) 2.5 MG TABS tablet Take 1 tablet (2.5 mg total) by mouth 2 (two) times daily. 05/14/20  Yes Luke Vernell BRAVO, MD  aspirin  EC 81 MG tablet Take 81 mg by mouth daily.   Yes [provider]  BLACK ELDERBERRY PO Take 15 mLs by mouth daily.   Yes [provider]  cetirizine (ZYRTEC) 10 MG tablet Take 10 mg by mouth daily.   Yes [provider]  ferrous sulfate  325 (65 FE) MG tablet Take 325 mg by mouth in the morning and at bedtime.    Yes [provider]  furosemide  (LASIX ) 20 MG tablet Take 1 tablet (20 mg total) by mouth daily. 05/14/20  Yes Luke Vernell BRAVO, MD  glimepiride  (AMARYL ) 4 MG tablet Take 1 tablet (4 mg total) by mouth daily with breakfast. 04/06/17  Yes Love, Sharlet RAMAN, PA-C  linagliptin  (TRADJENTA ) 5 MG TABS tablet Take 1 tablet (5 mg total) by mouth daily. 04/06/17  Yes Love, Sharlet RAMAN, PA-C  losartan  (COZAAR ) 25 MG tablet Take 1 tablet (25 mg total) by mouth daily. 05/15/20  Yes Luke Vernell BRAVO, MD  rosuvastatin  (CRESTOR ) 10 MG tablet Take 1 tablet (10 mg total) by mouth daily. Patient taking differently: Take 10 mg by mouth at bedtime. 05/15/20  Yes Luke Vernell BRAVO, MD    Allergies: Lopressor  [metoprolol  tartrate] and Lipitor [atorvastatin ]    Review of Systems  All other systems reviewed and are negative.   Updated Vital Signs BP (!) 122/56   Pulse 65   Temp 97.7 F (36.5 C)   Resp 18   Ht 5' 6 (1.676 m)   Wt 54.4 kg   SpO2 96%   BMI 19.37 kg/m   Physical Exam Vitals and nursing note reviewed.  Constitutional:      General: He is not in acute distress.    Appearance: He is well-developed. He is ill-appearing.  HENT:     Head: Normocephalic and atraumatic.  Eyes:     Conjunctiva/sclera:  Conjunctivae normal.  Cardiovascular:     Rate and Rhythm: Normal rate and regular rhythm.  Pulmonary:     Effort: Pulmonary effort is normal. No respiratory distress.     Breath sounds: Normal breath sounds.  Abdominal:     Palpations: Abdomen is soft.     Tenderness: There is no abdominal tenderness.  Musculoskeletal:        General: No swelling.     Cervical back: Neck supple.  Skin:    General: Skin is warm and dry.     Capillary Refill: Capillary refill takes less than 2 seconds.     Coloration: Skin is pale.  Neurological:     Mental Status: He is alert.  Psychiatric:        Mood and Affect: Mood normal.     (all labs ordered are listed, but only abnormal results are displayed) Labs Reviewed   COMPREHENSIVE METABOLIC PANEL WITH GFR - Abnormal; Notable for the following components:      Result Value   CO2 21 (*)    Glucose, Bld 189 (*)    BUN 64 (*)    Creatinine, Ser 1.93 (*)    Calcium  8.2 (*)    Total Protein 4.5 (*)    Albumin 2.6 (*)    GFR, Estimated 34 (*)    All other components within normal limits  CBC - Abnormal; Notable for the following components:   RBC 2.10 (*)    Hemoglobin 7.0 (*)    HCT 21.9 (*)    MCV 104.3 (*)    All other components within normal limits  PROTIME-INR - Abnormal; Notable for the following components:   Prothrombin Time 16.5 (*)    INR 1.3 (*)    All other components within normal limits  I-STAT CG4 LACTIC ACID, ED - Abnormal; Notable for the following components:   Lactic Acid, Venous 3.0 (*)    All other components within normal limits  TROPONIN I (HIGH SENSITIVITY) - Abnormal; Notable for the following components:   Troponin I (High Sensitivity) 59 (*)    All other components within normal limits  HEMOGLOBIN AND HEMATOCRIT, BLOOD  I-STAT CG4 LACTIC ACID, ED  POC OCCULT BLOOD, ED  TYPE AND SCREEN  PREPARE RBC (CROSSMATCH)    EKG: EKG Interpretation Date/Time:  Sunday March 29 2024 04:04:24 EDT Ventricular Rate:  60 PR Interval:    QRS Duration:  105 QT Interval:  448 QTC Calculation: 448 R Axis:   -5  Text Interpretation: Junctional rhythm Consider anterior infarct Repol abnrm suggests ischemia, lateral leads Confirmed by Jerrol Agent ([id number]) on 03/29/2024 4:42:30 AM  Radiology: CT ANGIO GI BLEED Result Date: 03/29/2024 CLINICAL DATA:  GI bleed study, check for hemorrhagic source. EXAM: CTA ABDOMEN AND PELVIS WITHOUT AND WITH CONTRAST TECHNIQUE: Multidetector CT imaging of the abdomen and pelvis was performed using the standard protocol during bolus administration of intravenous contrast. Multiplanar reconstructed images and MIPs were obtained and reviewed to evaluate the vascular anatomy. RADIATION DOSE REDUCTION: This  exam was performed according to the departmental dose-optimization program which includes automated exposure control, adjustment of the mA and/or kV according to patient size and/or use of iterative reconstruction technique. CONTRAST:  90mL OMNIPAQUE  IOHEXOL  350 MG/ML SOLN COMPARISON:  CTA chest, abdomen and pelvis 06/10/2020. FINDINGS: VASCULAR Aorta: Within normal caliber limits but heavily calcified. No dissection. Celiac: There are ostial calcific plaques with a 40% origin stenosis. The vessel is otherwise widely patent. There are patchy calcifications in the splenic arterial  branch artery with moderate to severe irregular stenosis. SMA: There is moderate mixed plaque in the vessel proximal to the inflection point, with up to 50% luminal stenosis. Distal to the inflection point, there is no flow-limiting stenosis. There is a replaced, widely patent right hepatic artery arising from it. Renals: Chronically occluded right renal artery with end-stage atrophy. Only minimal right renal enhancement seen and is not seen until the portal venous phase images. Left renal artery is also single. Circumferential calcific plaques in the proximal 1 cm cause 60-70% stenosis. The vessel is otherwise widely patent. IMA: High-grade calcific origin stenosis. The vessel otherwise opacifies well possible through backfill. Inflow: There are patchy vessel wall calcifications greatest in the common iliac and internal iliac arteries, but no flow-limiting inflow vessel stenosis bilaterally. Proximal Outflow: Bilateral common femoral and visualized portions of the superficial and profunda femoral arteries are patent without evidence of aneurysm, dissection, vasculitis or significant stenosis. There are calcific plaques in the common femoral arteries but they are nonstenosing. Veins: Patent. Review of the MIP images confirms the above findings. NON-VASCULAR Lower chest: There is posterior atelectasis in the lung bases without infiltrates  Mild elevation right hemidiaphragm. The cardiac blood pool is less dense than the myocardium on the noncontrast imaging consistent with anemia. There are old sternotomy and CABG changes, heavy native CAD, pacemaker wiring in the right heart. Interval TAVR. There is mild cardiomegaly with left chamber predominance and no pericardial effusion. Hepatobiliary: No focal liver abnormality is seen. No gallstones, gallbladder wall thickening, or biliary dilatation. Pancreas: Moderately atrophic.  No mass or ductal dilatation Spleen: No abnormality. Adrenals/Urinary Tract: Severe chronic vascular areolar atrophy. No adrenal mass. Scattered subcentimeter Bosniak 2 right renal cysts are too small to characterize but do not require follow-up. There is chronic asymmetric right perinephric stranding and trace fluid. On the left, there has an interpolar Bosniak 1 parapelvic cyst measuring 3.2 cm, 18 Hounsfield units. No follow-up imaging is recommended. There is no renal mass enhancement. No urinary stone or obstruction. There is mild bladder thickening versus underdistention. Correlate with urinalysis for possible significance. Stomach/Bowel: The stomach is moderately distended with fluid and either a large amount of food products or bezoar. Some concern for early pneumatosis in the lower fundal wall, although there is no portal venous gas. Close clinical follow-up recommended. No active GI bleed is seen. The small bowel is normal caliber. No wall thickening or dilatation in the colon. There is a normal caliber appendix. The colon wall unremarkable until the distal descending and sigmoid segments with diverticulosis in these segments most advanced in the sigmoid. There is mucosal thickening and enhancement in the sigmoid segment, and faint stranding noted along the proximal sigmoid in keeping with a mild acute diverticulitis. There is no diverticular or intramural abscess and no free air. Lymphatic: No adenopathy. Reproductive:  Enlarged prostate, 5 cm transverse, unchanged. Other: No free hemorrhage, free fluid or free air. No abdominal wall hernia. Musculoskeletal: Osteopenia and degenerative change lumbar spine, most advanced at L4-5 were there has been decompression laminectomy and there is chronic grade 1 anterolisthesis. No acute or significant osseous findings. IMPRESSION: 1. No active GI bleed is seen. 2. Aortic and branch vessel atherosclerosis. 3. 40% origin stenosis of the celiac artery, 50% stenosis of the SMA proximal to the inflection point, and high-grade calcific origin stenosis of the IMA. 4. Chronically occluded right renal artery with end-stage right renal atrophy. 5. 60-70% calcific stenosis of the proximal left renal artery. 6. No flow-limiting inflow  or proximal outflow vessel stenosis. 7. Distended stomach with either a large amount of food products or bezoar. Some concern for early pneumatosis in the lower fundal wall, although there is no portal venous gas. Close clinical follow-up recommended. 8. Mild acute diverticulitis in the proximal sigmoid segment. No diverticular or intramural abscess and no free air. 9. Cystitis versus bladder nondistention or hypertrophy. 10. Prostatomegaly. 11. Cardiomegaly with CABG changes and TAVR. 12. Anemia. Aortic Atherosclerosis (ICD10-I70.0). Electronically Signed   By: Francis Quam M.D.   On: 03/29/2024 06:40   CT Head Wo Contrast Result Date: 03/29/2024 CLINICAL DATA:  CT HEAD WO CONTRASTHead trauma, minor (Age >= 65y)CT CERVICAL SPINE WO CONTRASTNeck trauma (Age >= 65y) EXAM: CT HEAD WITHOUT CONTRAST CT CERVICAL SPINE WITHOUT CONTRAST TECHNIQUE: Multidetector CT imaging of the head and cervical spine was performed following the standard protocol without intravenous contrast. Multiplanar CT image reconstructions of the cervical spine were also generated. RADIATION DOSE REDUCTION: This exam was performed according to the departmental dose-optimization program which includes  automated exposure control, adjustment of the mA and/or kV according to patient size and/or use of iterative reconstruction technique. COMPARISON:  Head CT 03/25/2017 FINDINGS: CT HEAD FINDINGS Brain: There is no evidence for acute hemorrhage, hydrocephalus, mass lesion, or abnormal extra-axial fluid collection. No definite CT evidence for acute infarction. Diffuse loss of parenchymal volume is consistent with atrophy. Patchy low attenuation in the deep hemispheric and periventricular white matter is nonspecific, but likely reflects chronic microvascular ischemic demyelination. Vascular: No hyperdense vessel or unexpected calcification. Skull: No evidence for fracture. No worrisome lytic or sclerotic lesion. Sinuses/Orbits: Mild chronic mucosal thickening noted right sphenoid sinus. Similar chronic changes left maxillary sinus. No mastoid effusion. Visualized portions of the globes and intraorbital fat are unremarkable. Other: None. CT CERVICAL SPINE FINDINGS Alignment: Normal. Skull base and vertebrae: No acute fracture. No primary bone lesion or focal pathologic process. Soft tissues and spinal canal: No prevertebral fluid or swelling. No visible canal hematoma. Disc levels: Loss of disc height with endplate degeneration noted C4-5, C5-6, and C6-7. Upper chest: No acute findings. Other: None IMPRESSION: 1. No acute intracranial abnormality. 2. Atrophy with chronic small vessel ischemic disease. 3. Degenerative changes in the cervical spine without fracture. Electronically Signed   By: Camellia Candle M.D.   On: 03/29/2024 06:08   CT Cervical Spine Wo Contrast Result Date: 03/29/2024 CLINICAL DATA:  CT HEAD WO CONTRASTHead trauma, minor (Age >= 65y)CT CERVICAL SPINE WO CONTRASTNeck trauma (Age >= 65y) EXAM: CT HEAD WITHOUT CONTRAST CT CERVICAL SPINE WITHOUT CONTRAST TECHNIQUE: Multidetector CT imaging of the head and cervical spine was performed following the standard protocol without intravenous contrast.  Multiplanar CT image reconstructions of the cervical spine were also generated. RADIATION DOSE REDUCTION: This exam was performed according to the departmental dose-optimization program which includes automated exposure control, adjustment of the mA and/or kV according to patient size and/or use of iterative reconstruction technique. COMPARISON:  Head CT 03/25/2017 FINDINGS: CT HEAD FINDINGS Brain: There is no evidence for acute hemorrhage, hydrocephalus, mass lesion, or abnormal extra-axial fluid collection. No definite CT evidence for acute infarction. Diffuse loss of parenchymal volume is consistent with atrophy. Patchy low attenuation in the deep hemispheric and periventricular white matter is nonspecific, but likely reflects chronic microvascular ischemic demyelination. Vascular: No hyperdense vessel or unexpected calcification. Skull: No evidence for fracture. No worrisome lytic or sclerotic lesion. Sinuses/Orbits: Mild chronic mucosal thickening noted right sphenoid sinus. Similar chronic changes left maxillary sinus. No mastoid effusion.  Visualized portions of the globes and intraorbital fat are unremarkable. Other: None. CT CERVICAL SPINE FINDINGS Alignment: Normal. Skull base and vertebrae: No acute fracture. No primary bone lesion or focal pathologic process. Soft tissues and spinal canal: No prevertebral fluid or swelling. No visible canal hematoma. Disc levels: Loss of disc height with endplate degeneration noted C4-5, C5-6, and C6-7. Upper chest: No acute findings. Other: None IMPRESSION: 1. No acute intracranial abnormality. 2. Atrophy with chronic small vessel ischemic disease. 3. Degenerative changes in the cervical spine without fracture. Electronically Signed   By: Camellia Candle M.D.   On: 03/29/2024 06:08   DG Chest Portable 1 View Result Date: 03/29/2024 CLINICAL DATA:  Fever and vomiting. EXAM: PORTABLE CHEST 1 VIEW COMPARISON:  07/12/2020 FINDINGS: Low lung volumes. Asymmetric elevation  right hemidiaphragm. Interstitial markings are diffusely coarsened with chronic features. Atelectasis or scarring noted at the lung bases, left greater than right. The cardio pericardial silhouette is enlarged. Status post CABG and TAVR. Left-sided permanent pacemaker again noted. Telemetry leads overlie the chest. IMPRESSION: Low volume film without acute cardiopulmonary findings. Electronically Signed   By: Camellia Candle M.D.   On: 03/29/2024 05:19     .Critical Care  Performed by: Jerrol Agent, MD Authorized by: Jerrol Agent, MD   Critical care provider statement:    Critical care time (minutes):  30   Critical care was time spent personally by me on the following activities:  Development of treatment plan with patient or surrogate, discussions with consultants, evaluation of patient's response to treatment, examination of patient, ordering and review of laboratory studies, ordering and review of radiographic studies, ordering and performing treatments and interventions, pulse oximetry, re-evaluation of patient's condition and review of old charts   Care discussed with: admitting provider      Medications Ordered in the ED  pantoprazole  (PROTONIX ) injection 40 mg (40 mg Intravenous Given 03/29/24 0504)    Followed by  pantoprazole  (PROTONIX ) injection 40 mg (has no administration in time range)  0.9 %  sodium chloride  infusion (Manually program via Guardrails IV Fluids) (0 mLs Intravenous Hold 03/29/24 0509)  acetaminophen  (TYLENOL ) tablet 650 mg (has no administration in time range)    Or  acetaminophen  (TYLENOL ) suppository 650 mg (has no administration in time range)  sodium chloride  0.9 % bolus 1,000 mL (0 mLs Intravenous Stopped 03/29/24 0515)  ondansetron  (ZOFRAN ) injection 4 mg (4 mg Intravenous Given 03/29/24 0503)  iohexol  (OMNIPAQUE ) 350 MG/ML injection 90 mL (90 mLs Intravenous Contrast Given 03/29/24 0601)  piperacillin -tazobactam (ZOSYN ) IVPB 3.375 g (3.375 g Intravenous New  Bag/Given 03/29/24 0716)    Clinical Course as of 03/29/24 0839  Sun Mar 29, 2024  0438 Hemoglobin(!): 7.0 [JL]  0458 BUN(!): 64 [JL]  0458 Creatinine(!): 1.93 [JL]  0458 Lactic Acid, Venous(!!): 3.0 [JL]    Clinical Course User Index [JL] Jerrol Agent, MD                                 Medical Decision Making Amount and/or Complexity of Data Reviewed Labs: ordered. Decision-making details documented in ED Course. Radiology: ordered.  Risk Prescription drug management. Decision regarding hospitalization.    83 year old male with medical history significant for HTN, HLD, carotid artery disease, DM 2, atrial fibrillation on Eliquis , CAD status post CABG, PPM in place, CVA, history of TAVR presenting to the emergency department with concern for syncopal episode and hematemesis.  The patient had an  episode of syncope last night upon standing up from bed falling backward striking his right ear.  He was unresponsive for short period and then immediately was back to baseline, no seizure-like activity.  He has been more pale compared to usual and has been feeling fatigued and lightheaded.  No chest pain or shortness of breath.  Today he had 2 episodes of dark blood tonight, describes around 300 cc by EMS.  His blood glucoses have been in the 400s yesterday but normalized after taking his medications.  He is on Eliquis  but did not take any dose of the medication today, last took the medication last over 24 hours ago.  On arrival, the patient was afebrile, not tachycardic or tachypneic, BP 106/47, saturating 99% on room air.  Physical exam revealed pale appearing elderly gentleman in no distress.  Concern for upper GI bleed with hematemesis.  IV access was obtained.  The patient was administered IV fluid bolus for volume resuscitation.  Additionally, the patient had fallen yesterday and struck his head, will obtain CT imaging of the head and cervical spine in addition to CTA GI bleed and screening  labs.  CT Head and Cervical Spine:  IMPRESSION:  1. No acute intracranial abnormality.  2. Atrophy with chronic small vessel ischemic disease.  3. Degenerative changes in the cervical spine without fracture.   Labs: Lactic acid 1.7, CBC within anemia hemoglobin 7.0, CMP with a elevated BUN to 64, creatinine 1.93, type and screen collected.  CTA GI Bleed: IMPRESSION:  1. No active GI bleed is seen.  2. Aortic and branch vessel atherosclerosis.  3. 40% origin stenosis of the celiac artery, 50% stenosis of the SMA  proximal to the inflection point, and high-grade calcific origin  stenosis of the IMA.  4. Chronically occluded right renal artery with end-stage right  renal atrophy.  5. 60-70% calcific stenosis of the proximal left renal artery.  6. No flow-limiting inflow or proximal outflow vessel stenosis.  7. Distended stomach with either a large amount of food products or  bezoar. Some concern for early pneumatosis in the lower fundal wall,  although there is no portal venous gas. Close clinical follow-up  recommended.  8. Mild acute diverticulitis in the proximal sigmoid segment. No  diverticular or intramural abscess and no free air.  9. Cystitis versus bladder nondistention or hypertrophy.  10. Prostatomegaly.  11. Cardiomegaly with CABG changes and TAVR.  12. Anemia.    Patient with evidence of diverticulosis, possible bezoar in the stomach, distended with ? pneumatosis with distended stomach noted however patient without abdominal pain at this time.  Patient covered with IV antibiotics, IV Zosyn .  Gastroenterology consulted for further recommendations, recommendations pending at time of admitting.  Dr. Marcene of hospitalist medicine was consulted and ultimately accepted the patient in admission.  Of note, given the patient's anemia and hematemesis, he was consented for 1 unit PRBC transfusion.  Patient initially with low maps however improved with volume resuscitation with  maps in the 70s, BP 115/69 at time of admission.     Final diagnoses:  Hematemesis with nausea  Gastrointestinal hemorrhage, unspecified gastrointestinal hemorrhage type  Anemia, unspecified type    ED Discharge Orders     None          Jerrol Agent, MD 03/29/24 [phone]

## 2024-03-29 NOTE — H&P (Addendum)
 History and Physical    Patient: Bryan Carney FMW:[id number] DOB: 1940-10-09 DOA: 03/29/2024 DOS: the patient was seen and examined on 03/29/2024 PCP: Erick Greig LABOR, NP  Patient coming from: Home  Chief Complaint:  Chief Complaint  Patient presents with   Hematemesis   HPI: Bryan Carney is a 83 y.o. male with medical history significant of CAD s/p CABG, PAF on chronic anticoagulation, SSS s/p PPM, severe AS s/p TAVR in 2022, PE, CVA in setting of carotid artery stenosis s/p CEA, DM2, HTN, HLD, COPD, and CKD3 p/w hematemesis c/f UGIB.  The patient experienced multiple bowel movements on Friday, described as regular with no diarrhea, blood, or tarry stools. There was mild pain before defecation, which subsided post-movement. On Saturday morning, the patient fainted in a closet, sustaining minor injuries to the ear and knuckles. The patient's blood glucose was found to be elevated at 439 mg/dL, which was managed with glucose tablets, glimepiride , and trajenta, eventually bringing it down to 98 mg/dL. The patient later felt fine, was active, and had dinner. However, at 3 AM, the patient attempted to sit up and fainted again, appearing pale and clammy with eyes and mouth open. Upon regaining consciousness, the patient vomited blood, described as red and dark, in low to moderate amounts; as such, family activated EMS. Of note, no further vomiting occurred after arrival at the hospital.  In the ED, pt hypotensive. Labs notable for Cr 1.93 (baseline 1.5-1.7 in 2022), lactic acid 3-->1.7, and Hb 7 (down from 10s a year ago). EDP started 1U PRBC, consulted [facility] and requested medicine admission.   Review of Systems: As mentioned in the history of present illness. All other systems reviewed and are negative. Past Medical History:  Diagnosis Date   Anemia    Arthritis    CAD (coronary artery disease)    Chronic kidney disease    stage 3 per daughter   COPD (chronic obstructive pulmonary disease) (HCC)     Diabetes (HCC)    Fibromyalgia    25 years ago.   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 Verlin and Dr. Dusty   Squamous cell carcinoma    Past Surgical History:  Procedure Laterality Date   BACK SURGERY     CARDIAC CATHETERIZATION  10/15/1997   Recommend complete revascularization by CABG   CARDIOVASCULAR STRESS TEST  06/12/2012   No evidence of ischemia   CAROTID DOPPLER  08/27/2012   Rt bulb/proximal ICA demonstrated a mild-moderate amount of fibrous plaque without evidence of a significant diameter reduction or any other vascular abnormality.   CAROTID ENDARTERECTOMY Left 02/23/1995   CORONARY ANGIOPLASTY     CORONARY ARTERY BYPASS GRAFT  10/19/1997   x5. LIMA-LAD, SVG-PDA, SVG-OM1, seq SVG-fist and second branches of ramus intermedius.   HERNIA REPAIR     groin area.   INSERT / REPLACE / REMOVE PACEMAKER     NO PAST SURGERIES     PACEMAKER IMPLANT N/A  02/27/2018   Procedure: PACEMAKER IMPLANT;  Surgeon: Inocencio Soyla Lunger, MD;  Location: [facility]AS[facility];  Service: Cardiovascular;  Laterality: N/A;   RIGHT/LEFT HEART CATH AND CORONARY/GRAFT ANGIOGRAPHY N/A 05/27/2020   Procedure: RIGHT/LEFT HEART CATH AND CORONARY/GRAFT ANGIOGRAPHY;  Surgeon: Wonda Sharper, MD;  Location: [facility] INVASIVE CV LAB;  Service: Cardiovascular;  Laterality: N/A;   TEE WITHOUT CARDIOVERSION N/A 05/27/2020   Procedure: TRANSESOPHAGEAL ECHOCARDIOGRAM (TEE);  Surgeon: Francyne Headland, MD;  Location: [facility];  Service: Cardiovascular;  Laterality: N/A;   TEE WITHOUT  CARDIOVERSION N/A 07/12/2020   Procedure: TRANSESOPHAGEAL ECHOCARDIOGRAM (TEE);  Surgeon: Verlin Lonni BIRCH, MD;  Location: [facility] INVASIVE CV LAB;  Service: Open Heart Surgery;  Laterality: N/A;   TRANSCATHETER AORTIC VALVE REPLACEMENT, TRANSFEMORAL N/A 07/12/2020   Procedure: TRANSCATHETER AORTIC VALVE REPLACEMENT, TRANSFEMORAL;  Surgeon: Verlin Lonni BIRCH, MD;  Location: [facility]VAS[facility] CV [facility];  Service: Open Heart Surgery;  Laterality: N/A;   Social History:  reports that he has never smoked. He has never used smokeless tobacco. He reports that he does not drink alcohol and does not use drugs.  Allergies  Allergen Reactions   Lopressor  [Metoprolol  Tartrate] Other (See Comments)    Severe BP drop   Lipitor [Atorvastatin ] Other (See Comments)    Muscle aches    Family History  Problem Relation Age of Onset   Heart attack Father    Cancer Sister    Diabetes Brother    Cancer Sister    Diabetes Brother    Diabetes Brother    Diabetes Brother     Prior to Admission medications   Medication Sig Start Date End Date Taking? Authorizing Provider  amiodarone  (PACERONE ) 200 MG tablet TAKE ONE TABLET BY MOUTH DAILY. Please make overdue appt with Dr. Inocencio before anymore refills. Thank you 3rd and Final attempt 09/26/21  Yes Camnitz, Soyla Lunger, MD  apixaban  (ELIQUIS ) 2.5 MG TABS tablet Take 1 tablet (2.5 mg total) by mouth 2 (two) times daily. 05/14/20  Yes Luke Vernell BRAVO, MD  aspirin  EC 81 MG tablet Take 81 mg by mouth daily.   Yes [provider]  BLACK ELDERBERRY PO Take 15 mLs by mouth daily.   Yes [provider]  cetirizine (ZYRTEC) 10 MG tablet Take 10 mg by mouth daily.   Yes [provider]  ferrous sulfate  325 (65 FE) MG tablet Take 325 mg by mouth in the morning and at bedtime.    Yes [provider]  furosemide  (LASIX ) 20 MG tablet Take 1 tablet (20 mg total) by mouth daily. 05/14/20  Yes Luke Vernell BRAVO, MD  glimepiride  (AMARYL ) 4 MG tablet  Take 1 tablet (4 mg total) by mouth daily with breakfast. 04/06/17  Yes Love, Sharlet RAMAN, PA-C  linagliptin  (TRADJENTA ) 5 MG TABS tablet Take 1 tablet (5 mg total) by mouth daily. 04/06/17  Yes Love, Sharlet RAMAN, PA-C  losartan  (COZAAR ) 25 MG tablet Take 1 tablet (25 mg total) by mouth daily. 05/15/20  Yes Luke Vernell BRAVO, MD  rosuvastatin  (CRESTOR ) 10 MG tablet Take 1 tablet (10 mg total) by mouth daily. Patient taking differently: Take 10 mg by mouth at bedtime. 05/15/20  Yes Luke Vernell BRAVO, MD    Physical Exam: Vitals:   03/29/24 0635 03/29/24 0652 03/29/24 0700 03/29/24 0845  BP: (!) 112/50 115/69 (!) 122/56 (!) 138/50  Pulse: 60 60 65 60  Resp: 20 15 18 16   Temp: (!) 97.1 F (36.2 C) 97.7 F (36.5 C)    TempSrc:  SpO2: 95% 100% 96% 100%  Weight:      Height:       General: Alert, oriented x3, resting comfortably in no acute distress HEENT: EOMI, oropharynx clear, moist mucous membranes, hearing intact Neck: Trachea midline and no gross thyromegaly Respiratory: Lungs clear to auscultation bilaterally with normal respiratory effort; no w/r/r Cardiovascular: Regular rate and rhythm w/o m/r/g Abdomen: Soft, nontender, nondistended. Positive bowel sounds MSK: No obvious joint deformities or swelling Skin: No obvious rashes or lesions Neurologic: Awake, alert, spontaneously moves all extremities, strength intact Psychiatric: Appropriate mood and affect, conversational and cooperative   Data Reviewed:  Lab Results  Component Value Date   WBC 9.9 03/29/2024   HGB 7.0 (L) 03/29/2024   HCT 21.9 (L) 03/29/2024   MCV 104.3 (H) 03/29/2024   PLT 175 03/29/2024   Lab Results  Component Value Date   GLUCOSE 189 (H) 03/29/2024   CALCIUM  8.2 (L) 03/29/2024   NA 140 03/29/2024   K 3.8 03/29/2024   CO2 21 (L) 03/29/2024   CL 108 03/29/2024   BUN 64 (H) 03/29/2024   CREATININE 1.93 (H) 03/29/2024   Lab Results  Component Value Date   ALT 15 03/29/2024   AST 19 03/29/2024    ALKPHOS 54 03/29/2024   BILITOT 0.8 03/29/2024   Lab Results  Component Value Date   INR 1.3 (H) 03/29/2024   INR 1.0 07/08/2020   INR 1.1 06/23/2020   Radiology: CT ANGIO GI BLEED Result Date: 03/29/2024 CLINICAL DATA:  GI bleed study, check for hemorrhagic source. EXAM: CTA ABDOMEN AND PELVIS WITHOUT AND WITH CONTRAST TECHNIQUE: Multidetector CT imaging of the abdomen and pelvis was performed using the standard protocol during bolus administration of intravenous contrast. Multiplanar reconstructed images and MIPs were obtained and reviewed to evaluate the vascular anatomy. RADIATION DOSE REDUCTION: This exam was performed according to the departmental dose-optimization program which includes automated exposure control, adjustment of the mA and/or kV according to patient size and/or use of iterative reconstruction technique. CONTRAST:  90mL OMNIPAQUE  IOHEXOL  350 MG/ML SOLN COMPARISON:  CTA chest, abdomen and pelvis 06/10/2020. FINDINGS: VASCULAR Aorta: Within normal caliber limits but heavily calcified. No dissection. Celiac: There are ostial calcific plaques with a 40% origin stenosis. The vessel is otherwise widely patent. There are patchy calcifications in the splenic arterial branch artery with moderate to severe irregular stenosis. SMA: There is moderate mixed plaque in the vessel proximal to the inflection point, with up to 50% luminal stenosis. Distal to the inflection point, there is no flow-limiting stenosis. There is a replaced, widely patent right hepatic artery arising from it. Renals: Chronically occluded right renal artery with end-stage atrophy. Only minimal right renal enhancement seen and is not seen until the portal venous phase images. Left renal artery is also single. Circumferential calcific plaques in the proximal 1 cm cause 60-70% stenosis. The vessel is otherwise widely patent. IMA: High-grade calcific origin stenosis. The vessel otherwise opacifies well possible through backfill.  Inflow: There are patchy vessel wall calcifications greatest in the common iliac and internal iliac arteries, but no flow-limiting inflow vessel stenosis bilaterally. Proximal Outflow: Bilateral common femoral and visualized portions of the superficial and profunda femoral arteries are patent without evidence of aneurysm, dissection, vasculitis or significant stenosis. There are calcific plaques in the common femoral arteries but they are nonstenosing. Veins: Patent. Review of the MIP images confirms the above findings. NON-VASCULAR Lower chest: There is posterior atelectasis in the lung bases without infiltrates Mild elevation right hemidiaphragm. The  cardiac blood pool is less dense than the myocardium on the noncontrast imaging consistent with anemia. There are old sternotomy and CABG changes, heavy native CAD, pacemaker wiring in the right heart. Interval TAVR. There is mild cardiomegaly with left chamber predominance and no pericardial effusion. Hepatobiliary: No focal liver abnormality is seen. No gallstones, gallbladder wall thickening, or biliary dilatation. Pancreas: Moderately atrophic.  No mass or ductal dilatation Spleen: No abnormality. Adrenals/Urinary Tract: Severe chronic vascular areolar atrophy. No adrenal mass. Scattered subcentimeter Bosniak 2 right renal cysts are too small to characterize but do not require follow-up. There is chronic asymmetric right perinephric stranding and trace fluid. On the left, there has an interpolar Bosniak 1 parapelvic cyst measuring 3.2 cm, 18 Hounsfield units. No follow-up imaging is recommended. There is no renal mass enhancement. No urinary stone or obstruction. There is mild bladder thickening versus underdistention. Correlate with urinalysis for possible significance. Stomach/Bowel: The stomach is moderately distended with fluid and either a large amount of food products or bezoar. Some concern for early pneumatosis in the lower fundal wall, although there is  no portal venous gas. Close clinical follow-up recommended. No active GI bleed is seen. The small bowel is normal caliber. No wall thickening or dilatation in the colon. There is a normal caliber appendix. The colon wall unremarkable until the distal descending and sigmoid segments with diverticulosis in these segments most advanced in the sigmoid. There is mucosal thickening and enhancement in the sigmoid segment, and faint stranding noted along the proximal sigmoid in keeping with a mild acute diverticulitis. There is no diverticular or intramural abscess and no free air. Lymphatic: No adenopathy. Reproductive: Enlarged prostate, 5 cm transverse, unchanged. Other: No free hemorrhage, free fluid or free air. No abdominal wall hernia. Musculoskeletal: Osteopenia and degenerative change lumbar spine, most advanced at L4-5 were there has been decompression laminectomy and there is chronic grade 1 anterolisthesis. No acute or significant osseous findings. IMPRESSION: 1. No active GI bleed is seen. 2. Aortic and branch vessel atherosclerosis. 3. 40% origin stenosis of the celiac artery, 50% stenosis of the SMA proximal to the inflection point, and high-grade calcific origin stenosis of the IMA. 4. Chronically occluded right renal artery with end-stage right renal atrophy. 5. 60-70% calcific stenosis of the proximal left renal artery. 6. No flow-limiting inflow or proximal outflow vessel stenosis. 7. Distended stomach with either a large amount of food products or bezoar. Some concern for early pneumatosis in the lower fundal wall, although there is no portal venous gas. Close clinical follow-up recommended. 8. Mild acute diverticulitis in the proximal sigmoid segment. No diverticular or intramural abscess and no free air. 9. Cystitis versus bladder nondistention or hypertrophy. 10. Prostatomegaly. 11. Cardiomegaly with CABG changes and TAVR. 12. Anemia. Aortic Atherosclerosis (ICD10-I70.0). Electronically Signed   By:  Francis Quam M.D.   On: 03/29/2024 06:40   CT Head Wo Contrast Result Date: 03/29/2024 CLINICAL DATA:  CT HEAD WO CONTRASTHead trauma, minor (Age >= 65y)CT CERVICAL SPINE WO CONTRASTNeck trauma (Age >= 65y) EXAM: CT HEAD WITHOUT CONTRAST CT CERVICAL SPINE WITHOUT CONTRAST TECHNIQUE: Multidetector CT imaging of the head and cervical spine was performed following the standard protocol without intravenous contrast. Multiplanar CT image reconstructions of the cervical spine were also generated. RADIATION DOSE REDUCTION: This exam was performed according to the departmental dose-optimization program which includes automated exposure control, adjustment of the mA and/or kV according to patient size and/or use of iterative reconstruction technique. COMPARISON:  Head CT 03/25/2017 FINDINGS: CT  HEAD FINDINGS Brain: There is no evidence for acute hemorrhage, hydrocephalus, mass lesion, or abnormal extra-axial fluid collection. No definite CT evidence for acute infarction. Diffuse loss of parenchymal volume is consistent with atrophy. Patchy low attenuation in the deep hemispheric and periventricular white matter is nonspecific, but likely reflects chronic microvascular ischemic demyelination. Vascular: No hyperdense vessel or unexpected calcification. Skull: No evidence for fracture. No worrisome lytic or sclerotic lesion. Sinuses/Orbits: Mild chronic mucosal thickening noted right sphenoid sinus. Similar chronic changes left maxillary sinus. No mastoid effusion. Visualized portions of the globes and intraorbital fat are unremarkable. Other: None. CT CERVICAL SPINE FINDINGS Alignment: Normal. Skull base and vertebrae: No acute fracture. No primary bone lesion or focal pathologic process. Soft tissues and spinal canal: No prevertebral fluid or swelling. No visible canal hematoma. Disc levels: Loss of disc height with endplate degeneration noted C4-5, C5-6, and C6-7. Upper chest: No acute findings. Other: None IMPRESSION:  1. No acute intracranial abnormality. 2. Atrophy with chronic small vessel ischemic disease. 3. Degenerative changes in the cervical spine without fracture. Electronically Signed   By: Camellia Candle M.D.   On: 03/29/2024 06:08   CT Cervical Spine Wo Contrast Result Date: 03/29/2024 CLINICAL DATA:  CT HEAD WO CONTRASTHead trauma, minor (Age >= 65y)CT CERVICAL SPINE WO CONTRASTNeck trauma (Age >= 65y) EXAM: CT HEAD WITHOUT CONTRAST CT CERVICAL SPINE WITHOUT CONTRAST TECHNIQUE: Multidetector CT imaging of the head and cervical spine was performed following the standard protocol without intravenous contrast. Multiplanar CT image reconstructions of the cervical spine were also generated. RADIATION DOSE REDUCTION: This exam was performed according to the departmental dose-optimization program which includes automated exposure control, adjustment of the mA and/or kV according to patient size and/or use of iterative reconstruction technique. COMPARISON:  Head CT 03/25/2017 FINDINGS: CT HEAD FINDINGS Brain: There is no evidence for acute hemorrhage, hydrocephalus, mass lesion, or abnormal extra-axial fluid collection. No definite CT evidence for acute infarction. Diffuse loss of parenchymal volume is consistent with atrophy. Patchy low attenuation in the deep hemispheric and periventricular white matter is nonspecific, but likely reflects chronic microvascular ischemic demyelination. Vascular: No hyperdense vessel or unexpected calcification. Skull: No evidence for fracture. No worrisome lytic or sclerotic lesion. Sinuses/Orbits: Mild chronic mucosal thickening noted right sphenoid sinus. Similar chronic changes left maxillary sinus. No mastoid effusion. Visualized portions of the globes and intraorbital fat are unremarkable. Other: None. CT CERVICAL SPINE FINDINGS Alignment: Normal. Skull base and vertebrae: No acute fracture. No primary bone lesion or focal pathologic process. Soft tissues and spinal canal: No  prevertebral fluid or swelling. No visible canal hematoma. Disc levels: Loss of disc height with endplate degeneration noted C4-5, C5-6, and C6-7. Upper chest: No acute findings. Other: None IMPRESSION: 1. No acute intracranial abnormality. 2. Atrophy with chronic small vessel ischemic disease. 3. Degenerative changes in the cervical spine without fracture. Electronically Signed   By: Camellia Candle M.D.   On: 03/29/2024 06:08   DG Chest Portable 1 View Result Date: 03/29/2024 CLINICAL DATA:  Fever and vomiting. EXAM: PORTABLE CHEST 1 VIEW COMPARISON:  07/12/2020 FINDINGS: Low lung volumes. Asymmetric elevation right hemidiaphragm. Interstitial markings are diffusely coarsened with chronic features. Atelectasis or scarring noted at the lung bases, left greater than right. The cardio pericardial silhouette is enlarged. Status post CABG and TAVR. Left-sided permanent pacemaker again noted. Telemetry leads overlie the chest. IMPRESSION: Low volume film without acute cardiopulmonary findings. Electronically Signed   By: Camellia Candle M.D.   On: 03/29/2024 05:19  Assessment and Plan: 15M h/o CAD s/p CABG, PAF on chronic anticoagulation, SSS s/p PPM, severe AS s/p TAVR in 2022, PE, CVA in setting of carotid artery stenosis s/p CEA, DM2, HTN, HLD, COPD, and CKD3 p/w hematemesis c/f UGIB.  Hematemesis Presumed UGIB -GI consulted; apprec eval/recs -Transfuse 1U PRBC for total 2U PRBCs -2 large PIVs; f/u CBC q8h, goal Hb >7, transfuse prn; IV PPI BID; MIVF w/ LR 100cc/h x 24h; HOLD OAC and antiplatelet agents  -CLD for now  PAF -HOLD pta Eliquis   HFpEF -HOLD pta ASA, lasix , and losartan  per UGIB above -PTA amiodarone  200mg  daily (needs OP Cards f/u for refill) -PTA Crestor   DM2 -PTA glimeperide 4mg  daily -SSI TID AC prn   Advance Care Planning:   Code Status: Full Code   Consults: GI  Family Communication: Spouse  Severity of Illness: The appropriate patient status for this patient is  OBSERVATION. Observation status is judged to be reasonable and necessary in order to provide the required intensity of service to ensure the patient's safety. The patient's presenting symptoms, physical exam findings, and initial radiographic and laboratory data in the context of their medical condition is felt to place them at decreased risk for further clinical deterioration. Furthermore, it is anticipated that the patient will be medically stable for discharge from the hospital within 2 midnights of admission.    ------- I spent 60 minutes reviewing previous notes, at the bedside counseling/discussing the treatment plan, and performing clinical documentation.  Author: Marsha Ada, MD 03/29/2024 8:58 AM  For on call review [email or web address].

## 2024-03-29 NOTE — Progress Notes (Signed)
 Pt had moderate sized formed dark maroon/black BM per rectum. Pt denies black/maroon/bloody stools PTA.

## 2024-03-29 NOTE — Progress Notes (Addendum)
  Carryover admission to the Day Admitter.  I discussed this case with the EDP, Dr. Jerrol.  Per these discussions:   This is a 83 year old male with history of paroxysmal atrial fibrillation chronically anticoagulated on Eliquis , who is being admitted with acute upper gastrointestinal bleed complicated by acute blood loss anemia superimposed on anemia of chronic disease, after presenting with a total of 2 episodes of hematemesis this evening, without any additional episodes of hematemesis since arriving in the ED this evening.  The patient also reports generalized weakness, fatigue over the last few days, and has felt that he appears pale relative to baseline appearance.  On Eliquis  as an outpatient, with most recent reported dose occurring slightly more than 24 hours ago.  Per my discussions with EDP, no evidence of tachycardia or hypotension over the patient's ED course, with heart rates reportedly in the 60s, most recent blood pressure reported to be 112/50.  CMP notable for BUN elevated to 64.  CBC notable for hemoglobin 7 compared to most recent prior hemoglobin of 10 when checked 1 year ago.  EDP conveys that the patient is also followed by [facility] PCP, limiting availability of additional hemoglobin data points.  CTA GI blood study was reported to showed no evidence of extravasation, but was reported to show evidence of acute diverticulitis.  EDP conveys that the patient is not complaining of any abdominal discomfort.  EDP ordered a dose of Zosyn  in the setting of the finding of acute diverticulitis without evidence of abscess, perforation or obstruction.  In the ED, the patient was also received 1 L NS bolus, and EDP is ordered transfusion of 1 unit PRBC and started Protonix  40 mg IV twice daily.  EDP has a consult request out to on-call [facility].  I have placed an order for observation to PCU for further evaluation and management of the above.  I have placed some additional preliminary admit  orders via the adult multi-morbid admission order set. I have also ordered n.p.o., continued existing orders for Protonix  IV twice daily, and also ordered a repeat H&H to be checked around 10 AM, following completion of transfusion of 1 unit PRBC, with the latter ordered by EDP.    Eva Pore, DO Hospitalist

## 2024-03-29 NOTE — ED Notes (Signed)
 Patient transported to CT

## 2024-03-29 NOTE — ED Notes (Signed)
 CCMD called and notified

## 2024-03-30 ENCOUNTER — Encounter (HOSPITAL_COMMUNITY): Payer: Self-pay | Admitting: Internal Medicine

## 2024-03-30 ENCOUNTER — Encounter (HOSPITAL_COMMUNITY)
Admission: EM | Disposition: A | Discharge status: Home / Self Care | Payer: Self-pay | Source: Home / Self Care | Attending: Family Medicine

## 2024-03-30 ENCOUNTER — Observation Stay (HOSPITAL_COMMUNITY): Admitting: Anesthesiology

## 2024-03-30 ENCOUNTER — Telehealth: Payer: Self-pay

## 2024-03-30 DIAGNOSIS — K317 Polyp of stomach and duodenum: Secondary | ICD-10-CM | POA: Diagnosis not present

## 2024-03-30 DIAGNOSIS — I480 Paroxysmal atrial fibrillation: Secondary | ICD-10-CM | POA: Diagnosis not present

## 2024-03-30 DIAGNOSIS — K920 Hematemesis: Secondary | ICD-10-CM | POA: Diagnosis not present

## 2024-03-30 DIAGNOSIS — K449 Diaphragmatic hernia without obstruction or gangrene: Secondary | ICD-10-CM

## 2024-03-30 DIAGNOSIS — K254 Chronic or unspecified gastric ulcer with hemorrhage: Secondary | ICD-10-CM | POA: Diagnosis not present

## 2024-03-30 DIAGNOSIS — E785 Hyperlipidemia, unspecified: Secondary | ICD-10-CM | POA: Diagnosis present

## 2024-03-30 DIAGNOSIS — B9620 Unspecified Escherichia coli [E. coli] as the cause of diseases classified elsewhere: Secondary | ICD-10-CM | POA: Diagnosis not present

## 2024-03-30 DIAGNOSIS — I130 Hypertensive heart and chronic kidney disease with heart failure and stage 1 through stage 4 chronic kidney disease, or unspecified chronic kidney disease: Secondary | ICD-10-CM | POA: Diagnosis present

## 2024-03-30 DIAGNOSIS — B961 Klebsiella pneumoniae [K. pneumoniae] as the cause of diseases classified elsewhere: Secondary | ICD-10-CM | POA: Diagnosis not present

## 2024-03-30 DIAGNOSIS — J449 Chronic obstructive pulmonary disease, unspecified: Secondary | ICD-10-CM | POA: Diagnosis present

## 2024-03-30 DIAGNOSIS — K250 Acute gastric ulcer with hemorrhage: Secondary | ICD-10-CM | POA: Diagnosis present

## 2024-03-30 DIAGNOSIS — K922 Gastrointestinal hemorrhage, unspecified: Secondary | ICD-10-CM | POA: Diagnosis present

## 2024-03-30 DIAGNOSIS — I10 Essential (primary) hypertension: Secondary | ICD-10-CM | POA: Diagnosis not present

## 2024-03-30 DIAGNOSIS — I495 Sick sinus syndrome: Secondary | ICD-10-CM | POA: Diagnosis present

## 2024-03-30 DIAGNOSIS — I25119 Atherosclerotic heart disease of native coronary artery with unspecified angina pectoris: Secondary | ICD-10-CM | POA: Diagnosis not present

## 2024-03-30 DIAGNOSIS — E872 Acidosis, unspecified: Secondary | ICD-10-CM | POA: Diagnosis present

## 2024-03-30 DIAGNOSIS — I959 Hypotension, unspecified: Secondary | ICD-10-CM | POA: Diagnosis present

## 2024-03-30 DIAGNOSIS — E1165 Type 2 diabetes mellitus with hyperglycemia: Secondary | ICD-10-CM | POA: Diagnosis present

## 2024-03-30 DIAGNOSIS — E1122 Type 2 diabetes mellitus with diabetic chronic kidney disease: Secondary | ICD-10-CM | POA: Diagnosis present

## 2024-03-30 DIAGNOSIS — I2510 Atherosclerotic heart disease of native coronary artery without angina pectoris: Secondary | ICD-10-CM

## 2024-03-30 DIAGNOSIS — I5032 Chronic diastolic (congestive) heart failure: Secondary | ICD-10-CM | POA: Diagnosis present

## 2024-03-30 DIAGNOSIS — D62 Acute posthemorrhagic anemia: Secondary | ICD-10-CM | POA: Diagnosis present

## 2024-03-30 DIAGNOSIS — Z953 Presence of xenogenic heart valve: Secondary | ICD-10-CM | POA: Diagnosis not present

## 2024-03-30 DIAGNOSIS — E876 Hypokalemia: Secondary | ICD-10-CM | POA: Diagnosis present

## 2024-03-30 DIAGNOSIS — M797 Fibromyalgia: Secondary | ICD-10-CM | POA: Diagnosis present

## 2024-03-30 DIAGNOSIS — D649 Anemia, unspecified: Secondary | ICD-10-CM

## 2024-03-30 DIAGNOSIS — K3189 Other diseases of stomach and duodenum: Secondary | ICD-10-CM

## 2024-03-30 DIAGNOSIS — K5732 Diverticulitis of large intestine without perforation or abscess without bleeding: Secondary | ICD-10-CM | POA: Diagnosis present

## 2024-03-30 DIAGNOSIS — N183 Chronic kidney disease, stage 3 unspecified: Secondary | ICD-10-CM | POA: Diagnosis present

## 2024-03-30 DIAGNOSIS — T182XXA Foreign body in stomach, initial encounter: Secondary | ICD-10-CM | POA: Diagnosis not present

## 2024-03-30 DIAGNOSIS — I252 Old myocardial infarction: Secondary | ICD-10-CM | POA: Diagnosis not present

## 2024-03-30 DIAGNOSIS — N390 Urinary tract infection, site not specified: Secondary | ICD-10-CM | POA: Diagnosis not present

## 2024-03-30 DIAGNOSIS — Z7901 Long term (current) use of anticoagulants: Secondary | ICD-10-CM | POA: Diagnosis not present

## 2024-03-30 HISTORY — PX: ESOPHAGOGASTRODUODENOSCOPY: SHX5428

## 2024-03-30 LAB — HEMOGLOBIN AND HEMATOCRIT, BLOOD
HCT: 23.9 % — ABNORMAL LOW (ref 39.0–52.0)
HCT: 27.8 % — ABNORMAL LOW (ref 39.0–52.0)
Hemoglobin: 8.1 g/dL — ABNORMAL LOW (ref 13.0–17.0)
Hemoglobin: 9.4 g/dL — ABNORMAL LOW (ref 13.0–17.0)

## 2024-03-30 LAB — CBC
HCT: 20 % — ABNORMAL LOW (ref 39.0–52.0)
Hemoglobin: 6.6 g/dL — CL (ref 13.0–17.0)
MCH: 31.4 pg (ref 26.0–34.0)
MCHC: 33 g/dL (ref 30.0–36.0)
MCV: 95.2 fL (ref 80.0–100.0)
Platelets: 114 K/uL — ABNORMAL LOW (ref 150–400)
RBC: 2.1 MIL/uL — ABNORMAL LOW (ref 4.22–5.81)
RDW: 18.6 % — ABNORMAL HIGH (ref 11.5–15.5)
WBC: 11.8 K/uL — ABNORMAL HIGH (ref 4.0–10.5)
nRBC: 0.2 % (ref 0.0–0.2)

## 2024-03-30 LAB — BASIC METABOLIC PANEL WITH GFR
Anion gap: 9 (ref 5–15)
BUN: 73 mg/dL — ABNORMAL HIGH (ref 8–23)
CO2: 18 mmol/L — ABNORMAL LOW (ref 22–32)
Calcium: 7.4 mg/dL — ABNORMAL LOW (ref 8.9–10.3)
Chloride: 114 mmol/L — ABNORMAL HIGH (ref 98–111)
Creatinine, Ser: 1.94 mg/dL — ABNORMAL HIGH (ref 0.61–1.24)
GFR, Estimated: 34 mL/min — ABNORMAL LOW (ref >60)
Glucose, Bld: 212 mg/dL — ABNORMAL HIGH (ref 70–99)
Potassium: 4.2 mmol/L (ref 3.5–5.1)
Sodium: 141 mmol/L (ref 135–145)

## 2024-03-30 LAB — PREPARE RBC (CROSSMATCH)

## 2024-03-30 LAB — FOLATE: Folate: 14.6 ng/mL (ref >5.9)

## 2024-03-30 LAB — GLUCOSE, CAPILLARY
Glucose-Capillary: 312 mg/dL — ABNORMAL HIGH (ref 70–99)
Glucose-Capillary: 234 mg/dL — ABNORMAL HIGH (ref 70–99)
Glucose-Capillary: 215 mg/dL — ABNORMAL HIGH (ref 70–99)
Glucose-Capillary: 143 mg/dL — ABNORMAL HIGH (ref 70–99)
Glucose-Capillary: 95 mg/dL (ref 70–99)

## 2024-03-30 LAB — VITAMIN B12: Vitamin B-12: <150 pg/mL — ABNORMAL LOW (ref 180–914)

## 2024-03-30 SURGERY — EGD (ESOPHAGOGASTRODUODENOSCOPY)
Anesthesia: Monitor Anesthesia Care

## 2024-03-30 MED ORDER — SODIUM CHLORIDE 0.9 % IV SOLN
INTRAVENOUS | Status: AC | PRN
  Administered 2024-03-30: 500 mL via INTRAVENOUS

## 2024-03-30 MED ORDER — SODIUM CHLORIDE 0.9% IV SOLUTION: Freq: Once | INTRAVENOUS | Status: AC

## 2024-03-30 MED ORDER — SODIUM CHLORIDE 0.9% IV SOLUTION
Freq: Once | INTRAVENOUS | Status: DC
Start: 2024-03-30 — End: 2024-04-07

## 2024-03-30 MED ORDER — PROPOFOL 500 MG/50ML IV EMUL
INTRAVENOUS | Status: DC | PRN
  Administered 2024-03-30: 50 ug/kg/min via INTRAVENOUS

## 2024-03-30 MED ORDER — PHENYLEPHRINE HCL-NACL 20-0.9 MG/250ML-% IV SOLN
INTRAVENOUS | Status: DC | PRN
  Administered 2024-03-30: 40 ug/min via INTRAVENOUS

## 2024-03-30 MED ORDER — ONDANSETRON HCL 4 MG/2ML IJ SOLN
INTRAMUSCULAR | Status: DC | PRN
  Administered 2024-03-30: 4 mg via INTRAVENOUS

## 2024-03-30 MED ORDER — NOREPINEPHRINE 4 MG/250ML-% IV SOLN
INTRAVENOUS | Status: AC
  Filled 2024-03-30: qty 250

## 2024-03-30 MED ORDER — SODIUM CHLORIDE 0.9 % IV SOLN: INTRAVENOUS | Status: DC | PRN

## 2024-03-30 MED ORDER — SUCCINYLCHOLINE CHLORIDE 200 MG/10ML IV SOSY
PREFILLED_SYRINGE | INTRAVENOUS | Status: DC | PRN
  Administered 2024-03-30: 120 mg via INTRAVENOUS

## 2024-03-30 MED ORDER — EPINEPHRINE 1 MG/10ML IV SOSY
PREFILLED_SYRINGE | INTRAVENOUS | Status: AC
  Filled 2024-03-30: qty 10

## 2024-03-30 MED ORDER — LIDOCAINE 2% (20 MG/ML) 5 ML SYRINGE
INTRAMUSCULAR | Status: DC | PRN
  Administered 2024-03-30: 60 mg via INTRAVENOUS

## 2024-03-30 MED ORDER — PHENYLEPHRINE 80 MCG/ML (10ML) SYRINGE FOR IV PUSH (FOR BLOOD PRESSURE SUPPORT)
PREFILLED_SYRINGE | INTRAVENOUS | Status: DC | PRN
  Administered 2024-03-30: 160 ug via INTRAVENOUS

## 2024-03-30 MED ORDER — SODIUM CHLORIDE (PF) 0.9 % IJ SOLN
PREFILLED_SYRINGE | INTRAVENOUS | Status: DC | PRN
  Administered 2024-03-30: 8 mL

## 2024-03-30 MED ORDER — PROPOFOL 10 MG/ML IV BOLUS
INTRAVENOUS | Status: DC | PRN
  Administered 2024-03-30: 20 mg via INTRAVENOUS
  Administered 2024-03-30: 80 mg via INTRAVENOUS
  Administered 2024-03-30: 20 mg via INTRAVENOUS

## 2024-03-30 MED ORDER — METOCLOPRAMIDE HCL 5 MG/ML IJ SOLN
10.0000 mg | Freq: Once | INTRAMUSCULAR | Status: AC
  Administered 2024-03-30: 10 mg via INTRAVENOUS
  Filled 2024-03-30: qty 2

## 2024-03-30 NOTE — Progress Notes (Signed)
 Critical Hgb 6.6 this am. Provider on call Dr Franky notified via secure chat. VS stable. Awaiting orders for 1 additional unit of PRBC

## 2024-03-30 NOTE — Anesthesia Procedure Notes (Signed)
 Procedure Name: Intubation Date/Time: 03/30/2024 10:45 AM  Performed by: Jerl Donald LABOR, CRNAPre-anesthesia Checklist: Patient identified, Emergency Drugs available, Suction available and Patient being monitored Patient Re-evaluated:Patient Re-evaluated prior to induction Oxygen Delivery Method: Circle System Utilized Preoxygenation: Pre-oxygenation with 100% oxygen Induction Type: IV induction and Rapid sequence Laryngoscope Size: Mac and 3 Grade View: Grade I Tube type: Oral Tube size: 7.0 mm Number of attempts: 1 Placement Confirmation: ETT inserted through vocal cords under direct vision, positive ETCO2 and breath sounds checked- equal and bilateral Secured at: 21 cm Tube secured with: Tape Dental Injury: Teeth and Oropharynx as per pre-operative assessment

## 2024-03-30 NOTE — Telephone Encounter (Signed)
 Patient called in over weekend and LM on VM for device clinic reporting he doesn't feel well and thought he was back in [facility].   When reviewing chart I note that patient was admitted to hospital yesterday 03/29/24 for GI bleed, Anemia and Hematemesis.

## 2024-03-30 NOTE — Hospital Course (Signed)
 48M h/o CAD s/p CABG, PAF on chronic anticoagulation, SSS s/p PPM, severe AS s/p TAVR in 2022, PE, CVA in setting of carotid artery stenosis s/p CEA, DM2, HTN, HLD, COPD, and CKD3 p/w hematemesis c/f UGIB

## 2024-03-30 NOTE — Anesthesia Preprocedure Evaluation (Addendum)
 Anesthesia Evaluation  Patient identified by MRN, date of birth, ID band Patient awake    Reviewed: Allergy & Precautions, H&P , NPO status , Patient's Chart, lab work & pertinent test results  History of Anesthesia Complications Negative for: history of anesthetic complications  Airway Mallampati: I  TM Distance: >3 FB Neck ROM: Full    Dental no notable dental hx. (+) Lower Dentures, Upper Dentures   Pulmonary neg pulmonary ROS, pneumonia, COPD   Pulmonary exam normal breath sounds clear to auscultation       Cardiovascular hypertension, Pt. on medications + angina  + CAD, + Past MI, + Cardiac Stents, + CABG and +CHF  negative cardio ROS Normal cardiovascular exam+ dysrhythmias Atrial Fibrillation + pacemaker + Valvular Problems/Murmurs AS  Rhythm:Regular Rate:Normal  ECHO 1/23 1. Left ventricular ejection fraction, by estimation, is 40 to 45%. The  left ventricle has mildly decreased function. The left ventricle  demonstrates global hypokinesis with septal lateral dyssynchrony  suggesting interventricular conduction delay. There   is moderate left ventricular hypertrophy. Left ventricular diastolic  parameters are consistent with Grade I diastolic dysfunction (impaired  relaxation).   2. Right ventricular systolic function is normal. The right ventricular  size is normal. There is normal pulmonary artery systolic pressure. The  estimated right ventricular systolic pressure is 26.4 mmHg.   3. Left atrial size was moderately dilated.   4. The mitral valve is normal in structure. Trivial mitral valve  regurgitation. No evidence of mitral stenosis. Moderate mitral annular  calcification.   5. Bioprosthetic aortic valve s/p TAVR. 29 mm Edwards Sapien THV. Mean  gradient 6 mmHg with DI 0.54, no significant stenosis. No peri-valvlular  leakage visualized.      Neuro/Psych  Neuromuscular disease CVA, No Residual Symptoms  negative neurological ROS  negative psych ROS   GI/Hepatic negative GI ROS, Neg liver ROS,GERD  Medicated,,  Endo/Other  negative endocrine ROSdiabetes    Renal/GU Renal InsufficiencyRenal diseasenegative Renal ROS  negative genitourinary   Musculoskeletal negative musculoskeletal ROS (+) Arthritis ,  Fibromyalgia -  Abdominal   Peds negative pediatric ROS (+)  Hematology negative hematology ROS (+) Blood dyscrasia, anemia   Anesthesia Other Findings   Reproductive/Obstetrics negative OB ROS                              Anesthesia Physical Anesthesia Plan  ASA: 4 and emergent  Anesthesia Plan: MAC   Post-op Pain Management: Minimal or no pain anticipated   Induction: Intravenous  PONV Risk Score and Plan: 1 and Propofol  infusion and Treatment may vary due to age or medical condition  Airway Management Planned: Nasal Cannula, Natural Airway and Simple Face Mask  Additional Equipment:   Intra-op Plan:   Post-operative Plan:   Informed Consent: I have reviewed the patients History and Physical, chart, labs and discussed the procedure including the risks, benefits and alternatives for the proposed anesthesia with the patient or authorized representative who has indicated his/her understanding and acceptance.     Dental advisory given  Plan Discussed with: CRNA and Anesthesiologist  Anesthesia Plan Comments: ( )         Anesthesia Quick Evaluation

## 2024-03-30 NOTE — Care Management Obs Status (Signed)
 MEDICARE OBSERVATION STATUS NOTIFICATION   Patient Details  Name: Bryan Carney MRN: [id number] Date of Birth: September 13, 1940   Medicare Observation Status Notification Given:  Yes    [name] Sharps 03/30/2024, 9:38 AM

## 2024-03-30 NOTE — Anesthesia Postprocedure Evaluation (Signed)
 Anesthesia Post Note  Patient: Bryan Carney  Procedure(s) Performed: EGD (ESOPHAGOGASTRODUODENOSCOPY)     Patient location during evaluation: PACU Anesthesia Type: MAC Level of consciousness: awake and alert Pain management: pain level controlled Vital Signs Assessment: post-procedure vital signs reviewed and stable Respiratory status: spontaneous breathing, nonlabored ventilation, respiratory function stable and patient connected to nasal cannula oxygen Cardiovascular status: stable and blood pressure returned to baseline Postop Assessment: no apparent nausea or vomiting Anesthetic complications: no   No notable events documented.  Last Vitals:  Vitals:   03/30/24 1730 03/30/24 1818  BP: (!) 155/51 (!) 146/57  Pulse: (!) 59 60  Resp: 20   Temp: 36.7 C   SpO2: 94% 96%    Last Pain:  Vitals:   03/30/24 1949  TempSrc:   PainSc: Asleep                 Tacarra Justo

## 2024-03-30 NOTE — Transfer of Care (Signed)
 Immediate Anesthesia Transfer of Care Note  Patient: Bryan Carney  Procedure(s) Performed: EGD (ESOPHAGOGASTRODUODENOSCOPY)  Patient Location: PACU  Anesthesia Type:General  Level of Consciousness: drowsy  Airway & Oxygen Therapy: Patient Spontanous Breathing  Post-op Assessment: Report given to RN, Post -op Vital signs reviewed and stable, and Patient moving all extremities  Post vital signs: Reviewed and stable  Last Vitals:  Vitals Value Taken Time  BP 122/42 03/30/24 11:26  Temp    Pulse 60 03/30/24 11:28  Resp 22 03/30/24 11:28  SpO2 99 % 03/30/24 11:28  Vitals shown include unfiled device data.  Last Pain:  Vitals:   03/30/24 1010  TempSrc: Temporal  PainSc: 0-No pain         Complications: No notable events documented.

## 2024-03-30 NOTE — Progress Notes (Addendum)
 Patients daughter and wife facetime to check on him. Given updates by the nurse.

## 2024-03-30 NOTE — Progress Notes (Signed)
 Pt had small formed dark maroonish/black BM followed by moderate sized liquid BM. Pt denies abd pain and VS stable at this time. Remains on clear liquids awaiting GI consult

## 2024-03-30 NOTE — Op Note (Addendum)
 [facility] Patient Name: Bryan Carney Procedure Date : 03/30/2024 MRN: [id number] Attending MD: Elspeth SQUIBB. Leigh , MD, [id number] Date of Birth: May 08, 1941 CSN: [id number] Age: 83 Admit Type: Inpatient Procedure:                Upper GI endoscopy Indications:              Hematemesis - anticoagulated, last dose of Eliquis                             48 hours ago Providers:                Elspeth P. Leigh, MD, Robie Breed,                            RN, Felice Sar, Technician Referring MD:              Medicines:                Monitored Anesthesia Care Complications:            No immediate complications. Estimated blood loss:                            Minimal. Estimated Blood Loss:     Estimated blood loss was minimal. Procedure:                Pre-Anesthesia Assessment:                           - Prior to the procedure, a History and Physical                            was performed, and patient medications and                            allergies were reviewed. The patient's tolerance of                            previous anesthesia was also reviewed. The risks                            and benefits of the procedure and the sedation                            options and risks were discussed with the patient.                            All questions were answered, and informed consent                            was obtained. Prior Anticoagulants: The patient has                            taken Eliquis  (apixaban ), last dose was 4 days  prior to procedure. ASA Grade Assessment: IV - A                            patient with severe systemic disease that is a                            constant threat to life. After reviewing the risks                            and benefits, the patient was deemed in                            satisfactory condition to undergo the procedure.                           After obtaining  informed consent, the endoscope was                            passed under direct vision. Throughout the                            procedure, the patient's blood pressure, pulse, and                            oxygen saturations were monitored continuously. The                            [id number]-H190 ([id number]) Olympus endoscope was introduced                            through the mouth, and advanced to the second part                            of duodenum. The upper GI endoscopy was                            accomplished without difficulty. The patient                            tolerated the procedure well. Scope In: Scope Out: Findings:      The Z-line was regular.      A small hiatal hernia was present.      The exam of the esophagus was otherwise normal.      Food remnant and old clot was found in the gastric fundus and proximal       body. Once this was seen the patient was electively intubated to protect       the airway. Fundus / gastric body not able to be cleared given this       finding.      A polypoid mass lesion was found in the distal gastric body / proximal       antrum, along the greater curvature. It appeared to be semi pedunculated       / sessile. There was a broader base to it with a pedunculated component.  The based of it was ulcerated with clot, I suspect the likely source of       bleeding. There was old and fresh heme around the area and old clot in       the stomach. Biopsies were taken with a cold forceps for histology to       rule out malignancy. To help prevent / reduce further bleeding, the base       of it was successfully injected with 8 mL of a 0.1 mg/mL solution of       diluted epinephrine. Subsequently Purastat was then deployed, several       cc's, to cover the lesion (of note Nexpowder was not available during       this case). No active bleeding following endoscopic interventions.      The exam of the stomach was otherwise normal of what was  visualized,       proximal stomach again not able to be cleared.      The examined duodenum was normal. Impression:               - Z-line regular.                           - Small hiatal hernia.                           - Food (residue)/ old clot in the stomach, unable                            to be cleared. Patient electively intubated for                            this exam.                           - Gastric lesion in the distal gastric body /                            proximal antrum. Biopsied. Injected. Purastat                            applied.                           - Normal examined duodenum.                           While the entire stomach could not be cleared                            during this exam, bleeding / anemia is very likely                            related to polypoid mass lesion as outlined. While                            this could be a benign inflammatory polyp, the base  is broad with an ulcerated area and felt hard to                            palpation with forceps, concerning for underlying                            malignancy. Biopsies taken. Endoscopic treatment                            applied as outlined is temporarily until this has                            been more definitively treated. Recommendation:           - Return patient to hospital ward for ongoing care                            (assuming he extubates well per anesthesia)                           - Head of bed elevated. Give dose of Reglan as                            ordered.                           - NPO for now, monitor for recurrent bleeding.                           - Continue present medications.                           - Continue IV protonix                            - Hold Eliquis                            - Await pathology results to determine next steps                            in care. GI service will follow. Will discuss  with                            the patient's family. Procedure Code(s):        --- Professional ---                           43255, 59, Esophagogastroduodenoscopy, flexible,                            transoral; with control of bleeding, any method                           43239, Esophagogastroduodenoscopy, flexible,  transoral; with biopsy, single or multiple Diagnosis Code(s):        --- Professional ---                           K44.9, Diaphragmatic hernia without obstruction or                            gangrene                           D49.0, Neoplasm of unspecified behavior of                            digestive system                           K92.0, Hematemesis CPT copyright 2022 [employer]. All rights reserved. The codes documented in this report are preliminary and upon coder review may  be revised to meet current compliance requirements. Elspeth P. Lynley Killilea, MD 03/30/2024 11:33:08 AM This report has been signed electronically. Number of Addenda: 0

## 2024-03-30 NOTE — Plan of Care (Signed)
  Problem: Health Behavior/Discharge Planning: Goal: Ability to identify and utilize available resources and services will improve Outcome: Progressing Goal: Ability to manage health-related needs will improve Outcome: Progressing   Problem: Tissue Perfusion: Goal: Adequacy of tissue perfusion will improve Outcome: Progressing   Problem: Education: Goal: Knowledge of General Education information will improve Description: Including pain rating scale, medication(s)/side effects and non-pharmacologic comfort measures Outcome: Progressing   Problem: Coping: Goal: Level of anxiety will decrease Outcome: Progressing   Problem: Elimination: Goal: Will not experience complications related to bowel motility Outcome: Progressing Goal: Will not experience complications related to urinary retention Outcome: Progressing   Problem: Pain Managment: Goal: General experience of comfort will improve and/or be controlled Outcome: Progressing   Problem: Nutritional: Goal: Maintenance of adequate nutrition will improve Outcome: Not Progressing Pt currently on clear liquids d/t GI bleeding   Problem: Nutrition: Goal: Adequate nutrition will be maintained Outcome: Not Progressing

## 2024-03-30 NOTE — Consult Note (Addendum)
 Consultation  Referring Provider: TRH/ Stephany Primary Care Physician:  Erick Greig LABOR, NP Primary Gastroenterologist:  Unassigned-remote Dr. Norah  Reason for Consultation: Acute GI bleed with hematemesis  HPI: Bryan Carney is a 83 y.o. male with history of atrial fibrillation, on chronic Eliquis , history of coronary artery disease status post CABG, sick sinus syndrome status post pacemaker placement, severe aortic stenosis status post TAVR 2022, prior history of PE, CVA with history of carotid artery stenosis and previous endarterectomy.  Also with diabetes, hypertension COPD, chronic kidney disease stage III.  Patient apparently had several bowel movements on Friday but these were described as being normal by the patient and family with no evidence of melena or hematochezia.  Early on Saturday morning he had a syncopal episode landing on his ear and hand.  Noted to have significantly elevated blood sugar in the 400+ range.  They tried to manage this at home however he had a second syncopal episode, per notes appeared clammy and family called EMS.  This morning family says he has not had any vomiting until the EMS arrived and then he vomited about 300 cc of dark reddish blood.  At that point he had not had any melena.  On initial assessment he was hypotensive.  Also hypotensive in the ER, lactate elevated at 3 and quickly normalized to 1.7. Flat minimally elevated troponins He received fluid bolus and 1 unit of packed RBCs in the ER for hemoglobin of 7.  (Baseline hemoglobin felt to be about 10)  Last dose of Eliquis  was on Saturday morning 03/28/2024  Patient has not had any previous GI bleeding.  He did have an admission several years ago for a large hematoma of the chest wall. He has not been on any NSAIDs. Patient is hard of hearing, daughter gives some of the history.  He has not had any recent complaints of dysphagia or odynophagia, no nausea or vomiting, no heartburn or indigestion,  no abdominal pain, no changes in bowel habits.  CTA yesterday with no extravasation to suggest active bleeding, he does have mesenteric vascular disease with 50% stenosis of the SMA, 40% stenosis at the origin of the celiac and high-grade calcification at the origin of the IMA, chronically occluded right renal artery and 60s to 70% stenosis left renal artery.  The stomach appeared distended with a large amount of food products or bezoar, some concern for possible early pneumatosis in the lower fundal wall although no portal venous gas.  There was some very minimal stranding noted in the proximal sigmoid raising the possibility of acute diverticulitis.  Labs on admission-WBC 9.9/hemoglobin 7/hematocrit 21.9/MCV 104.3/platelets 175 Pro time 16.5/INR 1.3 Troponin 59> Lactate 3> 0.7 Sodium 140/potassium 3.8/glucose 189/BUN 64/creatinine 1.93 Albumin 2.6 LFTs within normal limits  Hemoglobin this a.m./6 AM 6.6/hematocrit 20.0 after 2 units packed RBCs third unit hanging now  He has been hemodynamically stable overnight, complains of feeling fatigued, denies any nausea or vomiting.  He says he probably had 5 or 6 bowel movements through the night.     Past Medical History:  Diagnosis Date   Anemia    Arthritis    CAD (coronary artery disease)    Chronic kidney disease    stage 3 per daughter   COPD (chronic obstructive pulmonary disease) (HCC)    Diabetes (HCC)    Fibromyalgia    25 years ago.   History of CVA (cerebrovascular accident)    Hx pulmonary embolism    Hyperlipidemia  Hypertension    Myocardial infarction [facility])    Pneumonia    Presence of permanent cardiac pacemaker    Prostate cancer (HCC)    PVOD (pulmonary veno-occlusive disease) (HCC)    S/P CABG x 5 10/19/1997   LIMA to LAD, SVG to ramus, SVG to OM2-OM3, SVG to PDA   S/P TAVR (transcatheter aortic valve replacement) 07/12/2020   s/p TAVR with a 29 mm Edwards Sapien 3 via the TF approach by Dr. Verlin and Dr.  Dusty   Squamous cell carcinoma     Past Surgical History:  Procedure Laterality Date   BACK SURGERY     CARDIAC CATHETERIZATION  10/15/1997   Recommend complete revascularization by CABG   CARDIOVASCULAR STRESS TEST  06/12/2012   No evidence of ischemia   CAROTID DOPPLER  08/27/2012   Rt bulb/proximal ICA demonstrated a mild-moderate amount of fibrous plaque without evidence of a significant diameter reduction or any other vascular abnormality.   CAROTID ENDARTERECTOMY Left 02/23/1995   CORONARY ANGIOPLASTY     CORONARY ARTERY BYPASS GRAFT  10/19/1997   x5. LIMA-LAD, SVG-PDA, SVG-OM1, seq SVG-fist and second branches of ramus intermedius.   HERNIA REPAIR     groin area.   INSERT / REPLACE / REMOVE PACEMAKER     NO PAST SURGERIES     PACEMAKER IMPLANT N/A 02/27/2018   Procedure: PACEMAKER IMPLANT;  Surgeon: Inocencio Soyla Lunger, MD;  Location: [facility] INVASIVE CV LAB;  Service: Cardiovascular;  Laterality: N/A;   RIGHT/LEFT HEART CATH AND CORONARY/GRAFT ANGIOGRAPHY N/A 05/27/2020   Procedure: RIGHT/LEFT HEART CATH AND CORONARY/GRAFT ANGIOGRAPHY;  Surgeon: Wonda Sharper, MD;  Location: [facility] INVASIVE CV LAB;  Service: Cardiovascular;  Laterality: N/A;   TEE WITHOUT CARDIOVERSION N/A 05/27/2020   Procedure: TRANSESOPHAGEAL ECHOCARDIOGRAM (TEE);  Surgeon: Francyne Headland, MD;  Location: [facility] ENDOSCOPY;  Service: Cardiovascular;  Laterality: N/A;   TEE WITHOUT CARDIOVERSION N/A 07/12/2020   Procedure: TRANSESOPHAGEAL ECHOCARDIOGRAM (TEE);  Surgeon: Verlin Lonni BIRCH, MD;  Location: [facility] INVASIVE CV LAB;  Service: Open Heart Surgery;  Laterality: N/A;   TRANSCATHETER AORTIC VALVE REPLACEMENT, TRANSFEMORAL N/A 07/12/2020   Procedure: TRANSCATHETER AORTIC VALVE REPLACEMENT, TRANSFEMORAL;  Surgeon: Verlin Lonni BIRCH, MD;  Location: [facility] INVASIVE CV LAB;  Service: Open Heart Surgery;  Laterality: N/A;    Prior to Admission medications   Medication Sig Start Date End Date Taking? Authorizing Provider   amiodarone  (PACERONE ) 200 MG tablet TAKE ONE TABLET BY MOUTH DAILY. Please make overdue appt with Dr. Inocencio before anymore refills. Thank you 3rd and Final attempt 09/26/21  Yes Camnitz, Soyla Lunger, MD  apixaban  (ELIQUIS ) 2.5 MG TABS tablet Take 1 tablet (2.5 mg total) by mouth 2 (two) times daily. 05/14/20  Yes Luke Vernell BRAVO, MD  aspirin  EC 81 MG tablet Take 81 mg by mouth daily.   Yes [provider]  BLACK ELDERBERRY PO Take 15 mLs by mouth daily.   Yes [provider]  cetirizine (ZYRTEC) 10 MG tablet Take 10 mg by mouth daily.   Yes [provider]  ferrous sulfate  325 (65 FE) MG tablet Take 325 mg by mouth in the morning and at bedtime.    Yes [provider]  furosemide  (LASIX ) 20 MG tablet Take 1 tablet (20 mg total) by mouth daily. 05/14/20  Yes Luke Vernell BRAVO, MD  glimepiride  (AMARYL ) 4 MG tablet Take 1 tablet (4 mg total) by mouth daily with breakfast. 04/06/17  Yes Love, Sharlet RAMAN, PA-C  linagliptin  (TRADJENTA ) 5 MG TABS tablet Take  1 tablet (5 mg total) by mouth daily. 04/06/17  Yes Love, Sharlet RAMAN, PA-C  losartan  (COZAAR ) 25 MG tablet Take 1 tablet (25 mg total) by mouth daily. 05/15/20  Yes Luke Vernell BRAVO, MD  rosuvastatin  (CRESTOR ) 10 MG tablet Take 1 tablet (10 mg total) by mouth daily. Patient taking differently: Take 10 mg by mouth at bedtime. 05/15/20  Yes Luke Vernell BRAVO, MD    Current Facility-Administered Medications  Medication Dose Route Frequency Provider Last Rate Last Admin   0.9 %  sodium chloride  infusion (Manually program via Guardrails IV Fluids)   Intravenous Once Lawsing, James, MD   Held at 03/29/24 0509   0.9 %  sodium chloride  infusion (Manually program via Guardrails IV Fluids)   Intravenous Once Kakrakandy, Arshad N, MD       0.9 %  sodium chloride  infusion   Intravenous Continuous Georgina Basket, MD 100 mL/hr at 03/30/24 0321 Infusion Verify at 03/30/24 0321   acetaminophen  (TYLENOL ) tablet 650 mg  650 mg Oral Q6H PRN  Howerter, Justin B, DO       Or   acetaminophen  (TYLENOL ) suppository 650 mg  650 mg Rectal Q6H PRN Howerter, Justin B, DO       amiodarone  (PACERONE ) tablet 200 mg  200 mg Oral Daily Georgina Basket, MD   200 mg at 03/30/24 9192   glimepiride  (AMARYL ) tablet 4 mg  4 mg Oral Q breakfast Georgina Basket, MD   4 mg at 03/30/24 9192   insulin  aspart (novoLOG ) injection 0-15 Units  0-15 Units Subcutaneous TID WC Georgina Basket, MD   11 Units at 03/30/24 9191   pantoprazole  (PROTONIX ) injection 40 mg  40 mg Intravenous Q12H Jerrol Agent, MD   40 mg at 03/30/24 9192   polyethylene glycol (MIRALAX  / GLYCOLAX ) packet 17 g  17 g Oral BID Georgina Basket, MD   17 g at 03/29/24 1454   rosuvastatin  (CRESTOR ) tablet 10 mg  10 mg Oral Daily Georgina Basket, MD   10 mg at 03/30/24 9192   senna-docusate (Senokot-S) tablet 2 tablet  2 tablet Oral BID Georgina Basket, MD   2 tablet at 03/29/24 1454    Allergies as of 03/29/2024 - Review Complete 03/29/2024  Allergen Reaction Noted   Lopressor  [metoprolol  tartrate] Other (See Comments) 06/12/2012   Lipitor [atorvastatin ] Other (See Comments) 04/04/2017    Family History  Problem Relation Age of Onset   Heart attack Father    Cancer Sister    Diabetes Brother    Cancer Sister    Diabetes Brother    Diabetes Brother    Diabetes Brother     Social History   Socioeconomic History   Marital status: Married    Spouse name: Bryan Carney   Number of children: 1   Years of education: High school 12 years   Highest education level: 12th grade  Occupational History   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  Tobacco Use   Smoking status: Never   Smokeless tobacco: Never  Vaping Use   Vaping status: Never Used  Substance and Sexual Activity   Alcohol use: No   Drug use: Never   Sexual activity: Not Currently  Other Topics Concern   Not on file  Social History Narrative   Not on file   Social Drivers of Health   Financial Resource Strain:  Not on file  Food Insecurity: No Food Insecurity (03/29/2024)   Hunger Vital Sign    Worried About Running  Out of Food in the Last Year: Never true    Ran Out of Food in the Last Year: Never true  Transportation Needs: No Transportation Needs (03/29/2024)   PRAPARE - Administrator, Civil Service (Medical): No    Lack of Transportation (Non-Medical): No  Physical Activity: Not on file  Stress: Not on file  Social Connections: Moderately Integrated (03/29/2024)   Social Connection and Isolation Panel    Frequency of Communication with Friends and Family: More than three times a week    Frequency of Social Gatherings with Friends and Family: More than three times a week    Attends Religious Services: More than 4 times per year    Active Member of [employer] or Organizations: No    Attends Banker Meetings: Never    Marital Status: Married  Catering manager Violence: Patient Unable To Answer (03/29/2024)   Humiliation, Afraid, Rape, and Kick questionnaire    Fear of Current or Ex-Partner: Patient unable to answer    Emotionally Abused: Patient unable to answer    Physically Abused: Patient unable to answer    Sexually Abused: Patient unable to answer    Review of Systems: Pertinent positive and negative review of systems were noted in the above HPI section.  All other review of systems was otherwise negative.   Physical Exam: Vital signs in last 24 hours: Temp:  [98.2 F (36.8 C)-99.9 F (37.7 C)] 98.6 F (37 C) (10/06 0732) Pulse Rate:  [57-71] 60 (10/06 0800) Resp:  [15-26] 17 (10/06 0800) BP: (113-157)/(39-65) 139/48 (10/06 0800) SpO2:  [90 %-100 %] 94 % (10/06 0800) Weight:  [59.9 kg] 59.9 kg (10/06 0351) Last BM Date : 03/30/24 General:   Alert,  Well-developed, thin elderly white male cooperative in NAD, pale appearing, hard of hearing, family at bedside Head:  Normocephalic and atraumatic. Eyes:  Sclera clear, no icterus.   Conjunctiva pale Ears:   Normal auditory acuity. Nose:  No deformity, discharge,  or lesions. Mouth:  No deformity or lesions.   Neck:  Supple; no masses or thyromegaly. Lungs:  Clear throughout to auscultation.   No wheezes, crackles, or rhonchi.  Heart:  Regular rate and rhythm; no murmurs, clicks, rubs,  or gallops.  No incisional scars Abdomen:  Soft,nontender, BS active,nonpalp mass or hsm.   Rectal: Not done Msk:  Symmetrical without gross deformities. . Pulses:  Normal pulses noted. Extremities:  Without clubbing or edema. Neurologic:  Alert and  oriented x4;  grossly normal neurologically.  Hard of hearing Skin:  Intact without significant lesions or rashes.. Psych:  Alert and cooperative. Normal mood and affect.  Intake/Output from previous day: 10/05 0701 - 10/06 0700 In: 1983.6 [I.V.:1296; Blood:687.6] Out: -  Intake/Output this shift: No intake/output data recorded.  Lab Results: Recent Labs    03/29/24 0402 03/29/24 1608 03/29/24 1759 03/30/24 0559  WBC 9.9 8.3  --  11.8*  HGB 7.0* 6.6* 7.1* 6.6*  HCT 21.9* 20.5* 21.8* 20.0*  PLT 175 115*  --  114*   BMET Recent Labs    03/29/24 0402  NA 140  K 3.8  CL 108  CO2 21*  GLUCOSE 189*  BUN 64*  CREATININE 1.93*  CALCIUM  8.2*   LFT Recent Labs    03/29/24 0402  PROT 4.5*  ALBUMIN 2.6*  AST 19  ALT 15  ALKPHOS 54  BILITOT 0.8   PT/INR Recent Labs    03/29/24 0402  LABPROT 16.5*  INR 1.3*  IMPRESSION:  #14 83 year old white male, on chronic Eliquis  for atrial fibrillation presenting after syncopal episode in the early morning hours yesterday.  Initially felt secondary to hyperglycemia however after that was treated at home he had a second syncopal episode and EMS was called.  Found to be hypotensive and then had an episode of hematemesis of dark red blood. Hypotensive in ER, corrected with fluid bolus and transfusion-initial hemoglobin 7 down from his baseline of about ten 1 year ago  CTA negative for any active  extravasation, does have extensive vascular disease, also noted a stomach full of food products possible bezoar and question area of early pneumatosis of the fundal wall.  Incidentally also noted some minimal stranding of the sigmoid colon raising possibility of diverticulitis  Patient had several bowel movements overnight, no further nausea or vomiting. Hemoglobin 6.6 this morning after 2 units packed RBCs-receiving third unit of packed RBCs.  Last dose of Eliquis  03/29/2019 5 AM  Patient with acute upper GI bleed of unclear etiology, painless.  CT findings as above.  Rule out gastric ulcer, underlying neoplasm,, severe gastropathy, AVM, [place]ul[place]oy.  Hopefully no bezoar present, findings may represent underlying gastroparesis  #2 atrial fibrillation-on Eliquis  #3 sick sinus syndrome status post pacemaker #4 chronic kidney disease stage III #5 prior history of PE and CVA #6 diabetes mellitus #7 status post TAVR 2022 #8 coronary artery disease status post CABG #9 COPD  #10 query mild diverticulitis-patient clinically without symptoms  Plan; keep n.p.o. Receiving third unit of packed RBCs currently, will order 1 more unit for his morning Continue IV PPI twice daily B12/folate- macrocytosis Continue to hold Eliquis  Trend hemoglobin every 6 hours and transfuse to keep his hemoglobin closer to 8 given advanced age and multiple comorbidities Patient will be scheduled for EGD with Dr. Leigh for today.  Of discussed the procedure with the patient and his family at bedside including indications risks and benefits and they are agreeable to proceed. GI will follow with you     Amy Esterwood PA-C 03/30/2024, 8:43 AM      ATTENDING ADDENDUM 83 year old male with A-fib on chronic Eliquis , history of CAD, sick sinus with pacemaker in place, status post TAVR for AAS, history of PE and CVA, admitted with symptoms concerning for upper GI bleed.  Had a syncopal episode, noted to have  hematemesis, dark red blood.  Noted to be anemic, hemoglobin in the 6s.  Has received a few units of blood and hemodynamically improved.  Last dose of Eliquis  was about 48 hours ago.  Discussed the situation with him, differential diagnosis.  He at risk for GI bleeding in the setting of anticoagulation.  He denies any NSAID use.  He has never had an endoscopy that he is aware of, no reported history of PUD.  CTA done without active extravasation over 24 hours ago.  Of note there was question of retained food/bezoar in the stomach although this exam was done over 24 hours ago at this point and has been on clear liquids since that time, hopefully that has passed.  Question of early pneumatosis on CT, he has no abdominal pain whatsoever, nontender.  I recommend EGD to further evaluate, clarify source of bleeding and to treat endoscopically if appropriate.  I discussed risks and benefits of the procedure and anesthesia with the patient and he wants to proceed.  Wife consents to the procedure, daughter is POA.  He has received an additional 2 units of blood this morning, plan for EGD this  morning.  Hemodynamically stable at this time.  Reglan ordered pre-EGD. Continue IV PPI, holding Eliquis , further recommendations pending the results of the exam.   High level decision making, complex patient, is at risk for morbidity / mortality, total time spent > 75 minutes.   Marcey Naval, MD [facility]

## 2024-03-30 NOTE — Progress Notes (Signed)
  Progress Note   Patient: Bryan Carney FMW:[id number] DOB: [birth year] DOA: 03/29/2024     0 DOS: the patient was seen and examined on 03/30/2024   Brief hospital course: 3M h/o CAD s/p CABG, PAF on chronic anticoagulation, SSS s/p PPM, severe AS s/p TAVR in 2022, PE, CVA in setting of carotid artery stenosis s/p CEA, DM2, HTN, HLD, COPD, and CKD3 p/w hematemesis c/f UGIB   Assessment and Plan: Hematemesis with UGI bleed and acute blood loss anemia -GI consulted -Pt noted to be anemic, requiring total 4 units PRBC's thus far -Pt underwent EGD, results reviewed. Findings notable for a gastric lesion in the distal gastric body/prox antrum with old clot in stomach. Biopsy pending -Recs to cont to hold anticoag -follow CBC trends   PAF -HOLD pta Eliquis    HFpEF -HOLD pta ASA, lasix , and losartan  per UGIB above -PTA amiodarone  200mg  daily  -PTA Crestor    DM2 -PTA glimeperide 4mg  daily -SSI TID AC prn   Subjective: Pt seen prior to EGD. Denies abd pain or nausea  Physical Exam: Vitals:   03/30/24 1310 03/30/24 1347 03/30/24 1404 03/30/24 1618  BP: (!) 140/50 (!) 154/54 (!) 142/51 (!) 141/48  Pulse: 61 60 61 62  Resp:  20 20 20   Temp:  98.7 F (37.1 C) 98 F (36.7 C) 98.2 F (36.8 C)  TempSrc:  Oral Axillary Oral  SpO2: 93% 97% 93% 96%  Weight:      Height:       General exam: Awake, laying in bed, in nad Respiratory system: Normal respiratory effort, no wheezing Cardiovascular system: regular rate, s1, s2 Gastrointestinal system: Soft, nondistended, positive BS Central nervous system: CN2-12 grossly intact, strength intact Extremities: Perfused, no clubbing Skin: Normal skin turgor, no notable skin lesions seen Psychiatry: Mood normal // no visual hallucinations   Data Reviewed:  Labs reviewed: Na 141, K 4.2, Cr 1.94, Hgb 8.1  Family Communication: Pt in room, family at bedside  Disposition: Status is: Observation The patient will require care spanning > 2  midnights and should be moved to inpatient because: severity of illness  Planned Discharge Destination: Home    Author: Garnette Pelt, MD 03/30/2024 5:23 PM  For on call review [email or web address].

## 2024-03-31 ENCOUNTER — Encounter (HOSPITAL_COMMUNITY): Payer: Self-pay | Admitting: Gastroenterology

## 2024-03-31 DIAGNOSIS — K3189 Other diseases of stomach and duodenum: Secondary | ICD-10-CM | POA: Diagnosis not present

## 2024-03-31 DIAGNOSIS — Z7901 Long term (current) use of anticoagulants: Secondary | ICD-10-CM

## 2024-03-31 DIAGNOSIS — K922 Gastrointestinal hemorrhage, unspecified: Secondary | ICD-10-CM | POA: Diagnosis not present

## 2024-03-31 DIAGNOSIS — K920 Hematemesis: Secondary | ICD-10-CM | POA: Diagnosis not present

## 2024-03-31 DIAGNOSIS — D649 Anemia, unspecified: Secondary | ICD-10-CM | POA: Diagnosis not present

## 2024-03-31 LAB — COMPREHENSIVE METABOLIC PANEL WITH GFR
ALT: 16 U/L (ref 0–44)
AST: 19 U/L (ref 15–41)
Albumin: 2.2 g/dL — ABNORMAL LOW (ref 3.5–5.0)
Alkaline Phosphatase: 42 U/L (ref 38–126)
Anion gap: 9 (ref 5–15)
BUN: 66 mg/dL — ABNORMAL HIGH (ref 8–23)
CO2: 18 mmol/L — ABNORMAL LOW (ref 22–32)
Calcium: 7.9 mg/dL — ABNORMAL LOW (ref 8.9–10.3)
Chloride: 117 mmol/L — ABNORMAL HIGH (ref 98–111)
Creatinine, Ser: 1.71 mg/dL — ABNORMAL HIGH (ref 0.61–1.24)
GFR, Estimated: 39 mL/min — ABNORMAL LOW (ref >60)
Glucose, Bld: 96 mg/dL (ref 70–99)
Potassium: 3.9 mmol/L (ref 3.5–5.1)
Sodium: 144 mmol/L (ref 135–145)
Total Bilirubin: 1.2 mg/dL (ref 0.0–1.2)
Total Protein: 4 g/dL — ABNORMAL LOW (ref 6.5–8.1)

## 2024-03-31 LAB — CBC
HCT: 26.4 % — ABNORMAL LOW (ref 39.0–52.0)
Hemoglobin: 8.9 g/dL — ABNORMAL LOW (ref 13.0–17.0)
MCH: 31 pg (ref 26.0–34.0)
MCHC: 33.7 g/dL (ref 30.0–36.0)
MCV: 92 fL (ref 80.0–100.0)
Platelets: 104 K/uL — ABNORMAL LOW (ref 150–400)
RBC: 2.87 MIL/uL — ABNORMAL LOW (ref 4.22–5.81)
RDW: 18.5 % — ABNORMAL HIGH (ref 11.5–15.5)
WBC: 12.4 K/uL — ABNORMAL HIGH (ref 4.0–10.5)
nRBC: 0.4 % — ABNORMAL HIGH (ref 0.0–0.2)

## 2024-03-31 LAB — BPAM RBC
Blood Product Expiration Date: 202510172359
Blood Product Expiration Date: 202510232359
Blood Product Expiration Date: 202510122359
Blood Product Expiration Date: 202510172359
ISSUE DATE / TIME: 202510060656
ISSUE DATE / TIME: 202510061340
ISSUE DATE / TIME: 202510050621
ISSUE DATE / TIME: 202510052008
Unit Type and Rh: 9500
Unit Type and Rh: 9500
Unit Type and Rh: 9500
Unit Type and Rh: 9500

## 2024-03-31 LAB — HEMOGLOBIN AND HEMATOCRIT, BLOOD
HCT: 26.2 % — ABNORMAL LOW (ref 39.0–52.0)
HCT: 24.3 % — ABNORMAL LOW (ref 39.0–52.0)
HCT: 23.9 % — ABNORMAL LOW (ref 39.0–52.0)
Hemoglobin: 8.9 g/dL — ABNORMAL LOW (ref 13.0–17.0)
Hemoglobin: 8.3 g/dL — ABNORMAL LOW (ref 13.0–17.0)
Hemoglobin: 7.9 g/dL — ABNORMAL LOW (ref 13.0–17.0)

## 2024-03-31 LAB — TYPE AND SCREEN
ABO/RH(D): O NEG
Antibody Screen: NEGATIVE
Unit division: 0
Unit division: 0
Unit division: 0

## 2024-03-31 LAB — GLUCOSE, CAPILLARY
Glucose-Capillary: 87 mg/dL (ref 70–99)
Glucose-Capillary: 108 mg/dL — ABNORMAL HIGH (ref 70–99)
Glucose-Capillary: 219 mg/dL — ABNORMAL HIGH (ref 70–99)
Glucose-Capillary: 126 mg/dL — ABNORMAL HIGH (ref 70–99)

## 2024-03-31 LAB — SURGICAL PATHOLOGY

## 2024-03-31 MED ORDER — NYSTATIN 100000 UNIT/ML MT SUSP
5.0000 mL | Freq: Four times a day (QID) | OROMUCOSAL | Status: DC
  Administered 2024-03-31 – 2024-04-06 (×19): 500000 [IU] via ORAL
  Administered 2024-04-06: 5 mL via ORAL
  Administered 2024-04-06 – 2024-04-07 (×2): 500000 [IU] via ORAL
  Filled 2024-03-31 (×24): qty 5

## 2024-03-31 NOTE — Progress Notes (Signed)
 Mobility Specialist Progress Note;   03/31/24 0953  Mobility  Activity Ambulated with assistance;Pivoted/transferred from bed to chair  Level of Assistance Minimal assist, patient does 75% or more  Assistive Device Front wheel walker  Distance Ambulated (ft) 100 ft  Activity Response Tolerated well  Mobility Referral Yes  Mobility visit 1 Mobility  Mobility Specialist Start Time (ACUTE ONLY) [id number]  Mobility Specialist Stop Time (ACUTE ONLY) 1016  Mobility Specialist Time Calculation (min) (ACUTE ONLY) 23 min   Pt and family eager for mobility. Required MinA to come to EoB with use of bed pads, MinG to safely ambulate in hallway. VSS throughout and no c/o when asked. Requested to sit in chair at [facility]. Pt left in chair with all needs met, call bell in reach. Daughter and wife present.   Lauraine Erm Mobility Specialist Please contact via SecureChat or [employer] [phone]

## 2024-03-31 NOTE — Progress Notes (Signed)
  Progress Note   Patient: Bryan Carney FMW:[id number] DOB: 05/16/1941 DOA: 03/29/2024     1 DOS: the patient was seen and examined on 03/31/2024   Brief hospital course: 23M h/o CAD s/p CABG, PAF on chronic anticoagulation, SSS s/p PPM, severe AS s/p TAVR in 2022, PE, CVA in setting of carotid artery stenosis s/p CEA, DM2, HTN, HLD, COPD, and CKD3 p/w hematemesis c/f UGIB   Assessment and Plan: Hematemesis with UGI bleed and acute blood loss anemia -GI consulted -Pt noted to be anemic, requiring total 4 units PRBC's thus far this visit -Pt underwent EGD, results reviewed. Findings notable for a gastric lesion in the distal gastric body/prox antrum with old clot in stomach. Biopsy results are pending, may be back as early as tomorrow -If findings are benign, then possible endoscopic removal of lesion.  -Continuing to hold anticoag for now -follow CBC trends   PAF -HOLD pta Eliquis    HFpEF -HOLD pta ASA, lasix , and losartan  per UGIB above -PTA amiodarone  200mg  daily  -PTA Crestor    DM2 -PTA glimeperide 4mg  daily -SSI TID AC prn   Subjective: Feeling better today. Still without abd pain or nausea  Physical Exam: Vitals:   03/31/24 0256 03/31/24 0257 03/31/24 0759 03/31/24 1155  BP:  (!) 145/51 (!) 137/52 (!) 137/47  Pulse:  (!) 59 60 60  Resp:  18 19 19   Temp:  98.2 F (36.8 C) 98.3 F (36.8 C) 97.8 F (36.6 C)  TempSrc:  Oral Oral Oral  SpO2:  95% 93% 99%  Weight: 60.6 kg     Height:       General exam: Conversant, in no acute distress Respiratory system: normal chest rise, clear, no audible wheezing Cardiovascular system: regular rhythm, s1-s2 Gastrointestinal system: Nondistended, nontender, pos BS Central nervous system: No seizures, no tremors Extremities: No cyanosis, no joint deformities Skin: No rashes, no pallor Psychiatry: Affect normal // no auditory hallucinations   Data Reviewed:  Labs reviewed: Na 144, K 3.9, Cr 1.71, WBC 12.4, Hgb 8.9, Plts  104  Family Communication: Pt in room, family at bedside  Disposition: Status is: Inpatient Continue inpatient stay because: severity of illness  Planned Discharge Destination: Home    Author: Garnette Pelt, MD 03/31/2024 2:42 PM  For on call review [email or web address].

## 2024-03-31 NOTE — Progress Notes (Addendum)
 Patient ID: Bryan Carney, male   DOB: [birth year], 83 y.o.   MRN: [id number]    Progress Note   Subjective   Day # 2 CC; acute GI bleed with hematemesis in setting of Eliquis   EGD yesterday-with finding of small hiatal hernia, old food and clot in the gastric fundus and body, there is a polypoid mass lesion in the distal gastric body/antrum along the greater curve.  There is a broader base with a pedunculated component.  The base of the lesion was ulcerated with clot and suspected to be the likely source of bleeding.  Biopsies were taken and base was injected with epinephrine to help prevent further bleeding then treated with hemostatic spray  Biopsies pending this a.m.  Labs WBC 12.4/hemoglobin 8.9/hematocrit 26.4-patient has had 4 units of packed RBCs Sodium 144/potassium 3.9/BUN 66/creatinine 1.71  Patient is sitting up in the chair, looks much better today, family at bedside.  Patient was able to ambulate in the hall today.  He says he feels much better denies any weakness or lightheadedness, no GI symptoms.  No bleeding/bowel movements overnight.   Objective   Vital signs in last 24 hours: Temp:  [97.3 F (36.3 C)-98.7 F (37.1 C)] 98.3 F (36.8 C) (10/07 0759) Pulse Rate:  [59-62] 60 (10/07 0759) Resp:  [16-22] 19 (10/07 0759) BP: (94-155)/(42-57) 137/52 (10/07 0759) SpO2:  [93 %-100 %] 93 % (10/07 0759) Weight:  [60.6 kg] 60.6 kg (10/07 0256) Last BM Date : 03/31/24 General:    Very elderly white male in NAD Heart:  Regular rate and rhythm; no murmurs Lungs: Respirations even and unlabored, lungs CTA bilaterally Abdomen:  Soft, nontender and nondistended. Normal bowel sounds. Extremities:  Without edema. Neurologic:  Alert and oriented,  grossly normal neurologically. Psych:  Cooperative. Normal mood and affect.  Intake/Output from previous day: 10/06 0701 - 10/07 0700 In: 1408.5 [P.O.:100; I.V.:716.5; Blood:592] Out: 800 [Urine:800] Intake/Output this shift: Total  I/O In: -  Out: 550 [Urine:550]  Lab Results: Recent Labs    03/29/24 1608 03/29/24 1759 03/30/24 0559 03/30/24 1346 03/30/24 1925 03/31/24 0531 03/31/24 0900  WBC 8.3  --  11.8*  --   --  12.4*  --   HGB 6.6*   < > 6.6*   < > 9.4* 8.9* 8.9*  HCT 20.5*   < > 20.0*   < > 27.8* 26.4* 26.2*  PLT 115*  --  114*  --   --  104*  --    < > = values in this interval not displayed.   BMET Recent Labs    03/29/24 0402 03/30/24 1346 03/31/24 0531  NA 140 141 144  K 3.8 4.2 3.9  CL 108 114* 117*  CO2 21* 18* 18*  GLUCOSE 189* 212* 96  BUN 64* 73* 66*  CREATININE 1.93* 1.94* 1.71*  CALCIUM  8.2* 7.4* 7.9*   LFT Recent Labs    03/31/24 0531  PROT 4.0*  ALBUMIN 2.2*  AST 19  ALT 16  ALKPHOS 42  BILITOT 1.2   PT/INR Recent Labs    03/29/24 0402  LABPROT 16.5*  INR 1.3*        Assessment / Plan:    #70 83 year old white male-on chronic Eliquis  for atrial fibrillation who presented after syncopal episode in the morning hours of Sunday, 01/28/2024.  He had a second syncopal episode, EMS was called was found to be hypotensive, then had episode of hematemesis of dark red blood. Initially hypotensive in the ER, responded to  fluid bolus, then transfusions. Hemoglobin on admit 7 down from baseline of hemoglobin 10 from 1 year previous.  CTA was negative for any active bleeding he does have extensive vascular disease also noted to have a stomach full of food products and a questionable area of early pneumatosis in the fundal wall, also noted minimal stranding of the sigmoid colon also noted  EGD yesterday with findings of above concerning for a polypoid mass in the distal gastric body/proximal antrum along the greater curve superior to be; pedunculated/sessile with a broader base.  The base of the lesion was ulcerated with a clot felt to be the source of patient's bleeding. Biopsies were taken then to reduce further bleeding lesion was injected with epinephrine and then treated  with Purastat.  Patient has been hemodynamically stable, no evidence of further active bleeding since the procedure and hemoglobin stable at 8.9 this a.m.  He has been able to be up and ambulating  #2 anticoagulation-on Eliquis -on hold since admit #3 diabetes mellitus #4.  Chronic kidney disease  Plan; clear liquids today Continue IV PPI twice daily Continue  to trend hemoglobin serially and transfuse as indicated Continue to hold Eliquis  Currently awaiting results of biopsies from yesterday and then further plan will be determined regarding management of the gastric mass. Family updated at bedside today-daughter would like to be called if she is not here at the hospital when biopsies have resulted    Principal Problem:   Acute upper GI bleed Active Problems:   Anticoagulated   Hematemesis with nausea   Anemia   Gastric mass     LOS: 1 day   Amy EsterwoodPA-C  03/31/2024, 11:14 AM    ATTENDING ADDENDUM Patient doing well, tolerating clear liquid diet. Hgb stable, no overt bleeding.  Family at bedside, [name], daughter, is point of contact for his health care decisions. We had a lengthy discussion about the findings yesterday. We are hoping this may be an inflammatory polyp that would be amenable to endoscopic resection, but need to rule out malignancy. Biopsies still pending, hopefully back tomorrow. We will use that information to determine next steps. If this is a benign lesion would attempt endoscopic removal, understand this lesion is at risk for bleeding with endoscopic resection. Otherwise, monitor for recurrent bleeding, trend Hgb, we will reassess him tomorrow.  Marcey Naval, MD [facility]

## 2024-03-31 NOTE — Plan of Care (Signed)
  Problem: Health Behavior/Discharge Planning: Goal: Ability to identify and utilize available resources and services will improve Outcome: Progressing   Problem: Metabolic: Goal: Ability to maintain appropriate glucose levels will improve Outcome: Progressing   Problem: Tissue Perfusion: Goal: Adequacy of tissue perfusion will improve Outcome: Progressing   Problem: Activity: Goal: Risk for activity intolerance will decrease Outcome: Progressing   Problem: Coping: Goal: Level of anxiety will decrease Outcome: Progressing   Problem: Education: Goal: Ability to describe self-care measures that may prevent or decrease complications (Diabetes Survival Skills Education) will improve Outcome: Not Progressing   Problem: Coping: Goal: Ability to adjust to condition or change in health will improve Outcome: Not Progressing   Problem: Fluid Volume: Goal: Ability to maintain a balanced intake and output will improve Outcome: Not Progressing   Problem: Health Behavior/Discharge Planning: Goal: Ability to manage health-related needs will improve Outcome: Not Progressing   Problem: Nutritional: Goal: Maintenance of adequate nutrition will improve Outcome: Not Progressing Goal: Progress toward achieving an optimal weight will improve Outcome: Not Progressing   Problem: Skin Integrity: Goal: Risk for impaired skin integrity will decrease Outcome: Not Progressing   Problem: Education: Goal: Knowledge of General Education information will improve Description: Including pain rating scale, medication(s)/side effects and non-pharmacologic comfort measures Outcome: Not Progressing   Problem: Nutrition: Goal: Adequate nutrition will be maintained Outcome: Not Progressing

## 2024-04-01 ENCOUNTER — Ambulatory Visit: Payer: Self-pay | Admitting: Gastroenterology

## 2024-04-01 DIAGNOSIS — D649 Anemia, unspecified: Secondary | ICD-10-CM | POA: Diagnosis not present

## 2024-04-01 DIAGNOSIS — K3189 Other diseases of stomach and duodenum: Secondary | ICD-10-CM | POA: Diagnosis not present

## 2024-04-01 DIAGNOSIS — K922 Gastrointestinal hemorrhage, unspecified: Secondary | ICD-10-CM | POA: Diagnosis not present

## 2024-04-01 DIAGNOSIS — K920 Hematemesis: Secondary | ICD-10-CM | POA: Diagnosis not present

## 2024-04-01 DIAGNOSIS — Z7901 Long term (current) use of anticoagulants: Secondary | ICD-10-CM | POA: Diagnosis not present

## 2024-04-01 LAB — CBC
HCT: 23.8 % — ABNORMAL LOW (ref 39.0–52.0)
Hemoglobin: 7.9 g/dL — ABNORMAL LOW (ref 13.0–17.0)
MCH: 30.7 pg (ref 26.0–34.0)
MCHC: 33.2 g/dL (ref 30.0–36.0)
MCV: 92.6 fL (ref 80.0–100.0)
Platelets: 107 K/uL — ABNORMAL LOW (ref 150–400)
RBC: 2.57 MIL/uL — ABNORMAL LOW (ref 4.22–5.81)
RDW: 17.7 % — ABNORMAL HIGH (ref 11.5–15.5)
WBC: 7.7 K/uL (ref 4.0–10.5)
nRBC: 0.3 % — ABNORMAL HIGH (ref 0.0–0.2)

## 2024-04-01 LAB — HEMOGLOBIN AND HEMATOCRIT, BLOOD
HCT: 23.4 % — ABNORMAL LOW (ref 39.0–52.0)
Hemoglobin: 7.9 g/dL — ABNORMAL LOW (ref 13.0–17.0)

## 2024-04-01 LAB — COMPREHENSIVE METABOLIC PANEL WITH GFR
ALT: 33 U/L (ref 0–44)
AST: 46 U/L — ABNORMAL HIGH (ref 15–41)
Albumin: 2.2 g/dL — ABNORMAL LOW (ref 3.5–5.0)
Alkaline Phosphatase: 47 U/L (ref 38–126)
Anion gap: 9 (ref 5–15)
BUN: 33 mg/dL — ABNORMAL HIGH (ref 8–23)
CO2: 20 mmol/L — ABNORMAL LOW (ref 22–32)
Calcium: 7.6 mg/dL — ABNORMAL LOW (ref 8.9–10.3)
Chloride: 109 mmol/L (ref 98–111)
Creatinine, Ser: 1.58 mg/dL — ABNORMAL HIGH (ref 0.61–1.24)
GFR, Estimated: 43 mL/min — ABNORMAL LOW (ref >60)
Glucose, Bld: 221 mg/dL — ABNORMAL HIGH (ref 70–99)
Potassium: 3.7 mmol/L (ref 3.5–5.1)
Sodium: 138 mmol/L (ref 135–145)
Total Bilirubin: 0.9 mg/dL (ref 0.0–1.2)
Total Protein: 3.9 g/dL — ABNORMAL LOW (ref 6.5–8.1)

## 2024-04-01 LAB — GLUCOSE, CAPILLARY
Glucose-Capillary: 97 mg/dL (ref 70–99)
Glucose-Capillary: 171 mg/dL — ABNORMAL HIGH (ref 70–99)
Glucose-Capillary: 79 mg/dL (ref 70–99)
Glucose-Capillary: 132 mg/dL — ABNORMAL HIGH (ref 70–99)

## 2024-04-01 MED ORDER — METOCLOPRAMIDE HCL 5 MG/ML IJ SOLN
10.0000 mg | Freq: Once | INTRAMUSCULAR | Status: AC
  Administered 2024-04-01: 10 mg via INTRAVENOUS
  Filled 2024-04-01: qty 2

## 2024-04-01 NOTE — TOC Initial Note (Signed)
 Transition of Care [facility]) - Initial/Assessment Note    Patient Details  Name: Bryan Carney MRN: [id number] Date of Birth: 1940/07/19  [facility] of [facility]) CM/SW Contact:    Sudie Erminio Deems, RN Phone Number: 04/01/2024, 1:52 PM  Clinical Narrative:  Patient presented for upper Gi bleed. PTA patient was from home with spouse. Spouse and daughter were in the room during the visit. Patient has DME cane, rolling walker, and shower chair in the home. Patient still drives himself to PCP appointments. No home needs identified during the visit. ICM will continue to follow for additional needs as the patient progresses.                  Expected Discharge Plan: Home/Self Care Barriers to Discharge: Continued Medical Work up   Patient Goals and CMS Choice Patient states their goals for this hospitalization and ongoing recovery are:: Plans to return home with family support   Choice offered to / list presented to : NA      Expected Discharge Plan and Services In-house Referral: NA Discharge Planning Services: CM Consult Post Acute Care Choice: NA Living arrangements for the past 2 months: Single Family Home                   DME Agency: NA       HH Arranged: NA          Prior Living Arrangements/Services Living arrangements for the past 2 months: Single Family Home Lives with:: Spouse Patient language and need for interpreter reviewed:: Yes Do you feel safe going back to the place where you live?: Yes      Need for Family Participation in Patient Care: No (Comment) Care giver support system in place?: No (comment)   Criminal Activity/Legal Involvement Pertinent to Current Situation/Hospitalization: No - Comment as needed  Activities of Daily Living   ADL Screening (condition at time of admission) Independently performs ADLs?: Yes (appropriate for developmental age) Is the patient deaf or have difficulty hearing?: Yes Does the patient have difficulty seeing,  even when wearing glasses/contacts?: No Does the patient have difficulty concentrating, remembering, or making decisions?: No  Permission Sought/Granted Permission sought to share information with : Family Supports, Case Manager                Emotional Assessment Appearance:: Appears stated age Attitude/Demeanor/Rapport: Engaged Affect (typically observed): Appropriate Orientation: : Oriented to Self, Oriented to Place Alcohol / Substance Use: Not Applicable Psych Involvement: No (comment)  Admission diagnosis:  Acute upper GI bleed [K92.2] Gastrointestinal hemorrhage, unspecified gastrointestinal hemorrhage type [K92.2] Hematemesis with nausea [K92.0] Anemia, unspecified type [D64.9] Patient Active Problem List   Diagnosis Date Noted   Hematemesis with nausea 03/30/2024   Anemia 03/30/2024   Gastric mass 03/30/2024   Acute upper GI bleed 03/29/2024   S/P TAVR (transcatheter aortic valve replacement) 07/12/2020   Presence of permanent cardiac pacemaker    History of CVA (cerebrovascular accident)    Paroxysmal atrial fibrillation (HCC)    Prostate cancer (HCC) 08/17/2014   Essential hypertension 07/07/2014   Hyperlipidemia 07/07/2014   Carotid artery disease 07/07/2014   Type 2 diabetes mellitus without complication, without long-term current use of insulin  (HCC) 07/07/2014   Anticoagulated 10/14/2012   S/P CABG x 5 10/19/1997   PCP:  Erick Greig LABOR, NP Pharmacy:   [facility] - [place], [place] - [address] [place] [address] Phone: [phone] Fax: [phone]  Social Drivers of Health (SDOH) Social History: SDOH Screenings   Food Insecurity: No Food Insecurity (03/29/2024)  Housing: Low Risk  (03/29/2024)  Transportation Needs: No Transportation Needs (03/29/2024)  Utilities: Not At Risk (03/29/2024)  Social Connections: Moderately Integrated (03/29/2024)  Tobacco Use: Low Risk  (03/30/2024)   SDOH Interventions:     Readmission Risk  Interventions     No data to display

## 2024-04-01 NOTE — Progress Notes (Addendum)
 [facility]  PROGRESS NOTE  Bryan Carney FMW:[id number] DOB: 02-Aug-1940 DOA: 03/29/2024 PCP: Erick Greig LABOR, NP   Brief HPI:    39M h/o CAD s/p CABG, PAF on chronic anticoagulation, SSS s/p PPM, severe AS s/p TAVR in 2022, PE, CVA in setting of carotid artery stenosis s/p CEA, DM2, HTN, HLD, COPD, and CKD3 p/w hematemesis c/f UGIB       Assessment/Plan:   Hematemesis with UGI bleed and acute blood loss anemia -GI consulted -Pt noted to be anemic, requiring total 4 units PRBC's thus far this visit -Pt underwent EGD, results reviewed. Findings notable for a gastric lesion in the distal gastric body/prox antrum with old clot in stomach. Biopsy results are positive for gastric hyperplastic polyp, negative for dysplasia or metaplasia -findings are benign, plan for  endoscopic removal of lesion in a.m.  -Continuing to hold anticoag for now - Hemoglobin is stable at 7.9   PAF -HOLD pta Eliquis    HFpEF -HOLD pta ASA, lasix , and losartan  per UGIB above -PTA amiodarone  200mg  daily  -PTA Crestor    DM2 -PTA glimeperide 4mg  daily -SSI TID AC prn -Hold glimepiride         Medications     sodium chloride    Intravenous Once   sodium chloride    Intravenous Once   amiodarone   200 mg Oral Daily   glimepiride   4 mg Oral Q breakfast   insulin  aspart  0-15 Units Subcutaneous TID WC   nystatin  5 mL Oral QID   pantoprazole  (PROTONIX ) IV  40 mg Intravenous Q12H   polyethylene glycol  17 g Oral BID   rosuvastatin   10 mg Oral Daily   senna-docusate  2 tablet Oral BID     Data Reviewed:   CBG:  Recent Labs  Lab 03/31/24 0757 03/31/24 1152 03/31/24 1545 03/31/24 2113 04/01/24 0742  GLUCAP 87 108* 219* 126* 97    SpO2: 97 % O2 Flow Rate (L/min): 4 L/min    Vitals:   03/31/24 1949 03/31/24 2324 04/01/24 0350 04/01/24 0742  BP: (!) 125/54 (!) 131/57 (!) 140/61 (!) 159/56  Pulse: 61 60 (!) 58 62  Resp: 18 16 18 18   Temp: 98.5 F (36.9 C) 97.7 F (36.5 C) 97.8 F  (36.6 C) 98.5 F (36.9 C)  TempSrc: Oral Oral Oral Oral  SpO2: 98% 97% 95% 97%  Weight:   56.7 kg   Height:          Data Reviewed:  Basic Metabolic Panel: Recent Labs  Lab 03/29/24 0402 03/30/24 1346 03/31/24 0531  NA 140 141 144  K 3.8 4.2 3.9  CL 108 114* 117*  CO2 21* 18* 18*  GLUCOSE 189* 212* 96  BUN 64* 73* 66*  CREATININE 1.93* 1.94* 1.71*  CALCIUM  8.2* 7.4* 7.9*    CBC: Recent Labs  Lab 03/29/24 0402 03/29/24 1608 03/29/24 1759 03/30/24 0559 03/30/24 1346 03/30/24 1925 03/31/24 0531 03/31/24 0900 03/31/24 1508 03/31/24 2050  WBC 9.9 8.3  --  11.8*  --   --  12.4*  --   --   --   HGB 7.0* 6.6*   < > 6.6*   < > 9.4* 8.9* 8.9* 8.3* 7.9*  HCT 21.9* 20.5*   < > 20.0*   < > 27.8* 26.4* 26.2* 24.3* 23.9*  MCV 104.3* 100.5*  --  95.2  --   --  92.0  --   --   --   PLT 175 115*  --  114*  --   --  104*  --   --   --    < > = values in this interval not displayed.    LFT Recent Labs  Lab 03/29/24 0402 03/31/24 0531  AST 19 19  ALT 15 16  ALKPHOS 54 42  BILITOT 0.8 1.2  PROT 4.5* 4.0*  ALBUMIN 2.6* 2.2*     Antibiotics: Anti-infectives (From admission, onward)    Start     Dose/Rate Route Frequency Ordered Stop   03/29/24 0700  piperacillin -tazobactam (ZOSYN ) IVPB 3.375 g        3.375 g 100 mL/hr over 30 Minutes Intravenous  Once 03/29/24 0655 03/29/24 0746        DVT prophylaxis: SCDs  Code Status: Full code  Family Communication: Discussed with patient's daughter at bedside   CONSULTS gastroenterology   Subjective   Denies nausea, vomiting or abdominal pain.   Objective    Physical Examination:   General-appears in no acute distress Heart-S1-S2, regular, no murmur auscultated Lungs-clear to auscultation bilaterally, no wheezing or crackles auscultated Abdomen-soft, nontender, no organomegaly Extremities-no edema in the lower extremities Neuro-alert, oriented x3, no focal deficit noted  Status is: Inpatient:              [name]   [facility] If 7PM-7AM, please contact night-coverage at [email or web address], Office  [phone]   04/01/2024, 8:25 AM  LOS: 2 days

## 2024-04-01 NOTE — Progress Notes (Addendum)
 Progress Note   Subjective  Patient doing okay, no bleeding symptoms. Feels well, tolerating liquids. He has no complaints today. God-daughter at bedside this AM.    Objective   Vital signs in last 24 hours: Temp:  [97.7 F (36.5 C)-98.5 F (36.9 C)] 98.5 F (36.9 C) (10/08 0742) Pulse Rate:  [58-62] 62 (10/08 0742) Resp:  [16-19] 18 (10/08 0742) BP: (125-159)/(47-61) 159/56 (10/08 0742) SpO2:  [95 %-99 %] 97 % (10/08 0742) Weight:  [56.7 kg] 56.7 kg (10/08 0350) Last BM Date : 03/31/24 General:    white male in NAD Neurologic:  Alert and oriented,  grossly normal neurologically. Psych:  Cooperative. Normal mood and affect.  Intake/Output from previous day: 10/07 0701 - 10/08 0700 In: 0  Out: 1000 [Urine:1000] Intake/Output this shift: Total I/O In: 240 [P.O.:240] Out: -   Lab Results: Recent Labs    03/29/24 1608 03/29/24 1759 03/30/24 0559 03/30/24 1346 03/31/24 0531 03/31/24 0900 03/31/24 1508 03/31/24 2050  WBC 8.3  --  11.8*  --  12.4*  --   --   --   HGB 6.6*   < > 6.6*   < > 8.9* 8.9* 8.3* 7.9*  HCT 20.5*   < > 20.0*   < > 26.4* 26.2* 24.3* 23.9*  PLT 115*  --  114*  --  104*  --   --   --    < > = values in this interval not displayed.   BMET Recent Labs    03/30/24 1346 03/31/24 0531  NA 141 144  K 4.2 3.9  CL 114* 117*  CO2 18* 18*  GLUCOSE 212* 96  BUN 73* 66*  CREATININE 1.94* 1.71*  CALCIUM  7.4* 7.9*   LFT Recent Labs    03/31/24 0531  PROT 4.0*  ALBUMIN 2.2*  AST 19  ALT 16  ALKPHOS 42  BILITOT 1.2   PT/INR No results for input(s): LABPROT, INR in the last 72 hours.  Studies/Results: No results found.     Assessment / Plan:    83 year old male here for the following:  Upper GI bleed secondary to friable gastric lesion Anemia Chronic anticoagulation - Eliquis  for history of atrial fibrillation  EGD on 10/6 showed a friable gastric polypoid lesion.  Biopsied and treated with Pura stat for temporizing  measures.  He had a large burden of blood clot in the fundus that could not be cleared at the time of that exam.  Superficial biopsies show a hyperplastic gastric polyp, no overt malignancy, however there is an ulcerated base to this and underlying malignancy remains possible.  Discussed the situation with the patient and family at bedside this morning. He needs definitive removal / treatment of this lesion or he will continue to bleed, and ideally would benefit from resumption of anticoagulation long term to reduce his risk for CVA.  I have discussed his case with my advanced endoscopy colleagues.  EUS would be useful here to assess depth of the lesion prior to resection attempt, help assess for malignancy, as there may be significant risk for bleeding with removing this lesion given it's appearance. Unfortunately Dr. Wilhelmenia of our group has no open access for EUS right now this week, unless there is a cancellation in his schedule.  I reached out to Dr. Rollin about his case, who also performs EUS. He had availability to help us  on Friday and offered to do EUS / EGD with me on Friday afternoon. I spoke with  the patient and his family about this. Risks / benefits of EUS / EGD / endoscopic resection of the lesion. They understand and are agreeable for procedure on Friday. If based on EUS this appears superficial and safe for resection, we will peform EMR. If there is depth to it or concerning for malignancy, then would perform additional biopsies and hold off on resection. This exam will also clear the rest of his stomach which was not seen in light of significant clot burden on the last exam. In the interim we will continue to hold Eliquis  and stay on clear liquid diet. I will give him a dose of Reglan preprocedure to help clear his stomach in case any clot has re-accumulated. Trend Hgb and transfuse PRN in the interim, contact me with any recurrent bleeding symptoms.  I spent 50 minutes of time reviewing  his case, discussion with patient / family, and advanced endoscopy colleagues.  Marcey Naval, MD [facility]

## 2024-04-02 DIAGNOSIS — D649 Anemia, unspecified: Secondary | ICD-10-CM | POA: Diagnosis not present

## 2024-04-02 DIAGNOSIS — K3189 Other diseases of stomach and duodenum: Secondary | ICD-10-CM | POA: Diagnosis not present

## 2024-04-02 DIAGNOSIS — Z7901 Long term (current) use of anticoagulants: Secondary | ICD-10-CM | POA: Diagnosis not present

## 2024-04-02 DIAGNOSIS — K922 Gastrointestinal hemorrhage, unspecified: Secondary | ICD-10-CM | POA: Diagnosis not present

## 2024-04-02 LAB — BASIC METABOLIC PANEL WITH GFR
Anion gap: 9 (ref 5–15)
BUN: 16 mg/dL (ref 8–23)
CO2: 22 mmol/L (ref 22–32)
Calcium: 7.7 mg/dL — ABNORMAL LOW (ref 8.9–10.3)
Chloride: 106 mmol/L (ref 98–111)
Creatinine, Ser: 1.28 mg/dL — ABNORMAL HIGH (ref 0.61–1.24)
GFR, Estimated: 55 mL/min — ABNORMAL LOW (ref >60)
Glucose, Bld: 92 mg/dL (ref 70–99)
Potassium: 3.9 mmol/L (ref 3.5–5.1)
Sodium: 137 mmol/L (ref 135–145)

## 2024-04-02 LAB — GLUCOSE, CAPILLARY
Glucose-Capillary: 95 mg/dL (ref 70–99)
Glucose-Capillary: 175 mg/dL — ABNORMAL HIGH (ref 70–99)
Glucose-Capillary: 147 mg/dL — ABNORMAL HIGH (ref 70–99)
Glucose-Capillary: 103 mg/dL — ABNORMAL HIGH (ref 70–99)

## 2024-04-02 LAB — COMPREHENSIVE METABOLIC PANEL WITH GFR
ALT: 35 U/L (ref 0–44)
AST: 34 U/L (ref 15–41)
Albumin: 2.2 g/dL — ABNORMAL LOW (ref 3.5–5.0)
Alkaline Phosphatase: 49 U/L (ref 38–126)
Anion gap: 8 (ref 5–15)
BUN: 22 mg/dL (ref 8–23)
CO2: 21 mmol/L — ABNORMAL LOW (ref 22–32)
Calcium: 7.6 mg/dL — ABNORMAL LOW (ref 8.9–10.3)
Chloride: 110 mmol/L (ref 98–111)
Creatinine, Ser: 1.36 mg/dL — ABNORMAL HIGH (ref 0.61–1.24)
GFR, Estimated: 51 mL/min — ABNORMAL LOW (ref >60)
Glucose, Bld: 96 mg/dL (ref 70–99)
Potassium: 3.2 mmol/L — ABNORMAL LOW (ref 3.5–5.1)
Sodium: 139 mmol/L (ref 135–145)
Total Bilirubin: 1 mg/dL (ref 0.0–1.2)
Total Protein: 3.9 g/dL — ABNORMAL LOW (ref 6.5–8.1)

## 2024-04-02 LAB — CBC
HCT: 23.5 % — ABNORMAL LOW (ref 39.0–52.0)
Hemoglobin: 7.9 g/dL — ABNORMAL LOW (ref 13.0–17.0)
MCH: 31.2 pg (ref 26.0–34.0)
MCHC: 33.6 g/dL (ref 30.0–36.0)
MCV: 92.9 fL (ref 80.0–100.0)
Platelets: 110 K/uL — ABNORMAL LOW (ref 150–400)
RBC: 2.53 MIL/uL — ABNORMAL LOW (ref 4.22–5.81)
RDW: 17.1 % — ABNORMAL HIGH (ref 11.5–15.5)
WBC: 8.1 K/uL (ref 4.0–10.5)
nRBC: 0 % (ref 0.0–0.2)

## 2024-04-02 LAB — PREPARE RBC (CROSSMATCH)

## 2024-04-02 LAB — MAGNESIUM: Magnesium: 1.9 mg/dL (ref 1.7–2.4)

## 2024-04-02 MED ORDER — MAGNESIUM SULFATE 2 GM/50ML IV SOLN
2.0000 g | Freq: Once | INTRAVENOUS | Status: AC
  Administered 2024-04-03: 2 g via INTRAVENOUS
  Filled 2024-04-02: qty 50

## 2024-04-02 MED ORDER — POTASSIUM CHLORIDE ER 10 MEQ PO TBCR: 40.0000 meq | EXTENDED_RELEASE_TABLET | Freq: Once | ORAL | Status: DC

## 2024-04-02 MED ORDER — POTASSIUM CHLORIDE 20 MEQ PO PACK
40.0000 meq | PACK | ORAL | Status: AC
  Administered 2024-04-02 (×2): 40 meq via ORAL
  Filled 2024-04-02 (×2): qty 2

## 2024-04-02 MED ORDER — FUROSEMIDE 10 MG/ML IJ SOLN
20.0000 mg | Freq: Once | INTRAMUSCULAR | Status: AC
  Administered 2024-04-02: 20 mg via INTRAVENOUS
  Filled 2024-04-02: qty 2

## 2024-04-02 MED ORDER — POTASSIUM CHLORIDE 20 MEQ PO PACK
40.0000 meq | PACK | Freq: Once | ORAL | Status: AC
  Administered 2024-04-03: 40 meq via ORAL
  Filled 2024-04-02: qty 2

## 2024-04-02 MED ORDER — SODIUM CHLORIDE 0.9% IV SOLUTION: Freq: Once | INTRAVENOUS | Status: AC

## 2024-04-02 NOTE — Progress Notes (Signed)
 RN called blood bank to follow up on readiness of PRBC. Currently waiting for pending labs to result.

## 2024-04-02 NOTE — Progress Notes (Signed)
 Mobility Specialist Progress Note;   04/02/24 1108  Mobility  Activity Ambulated with assistance;Pivoted/transferred from bed to chair  Level of Assistance Contact guard assist, steadying assist  Assistive Device Front wheel walker  Distance Ambulated (ft) 300 ft  Activity Response Tolerated well  Mobility Referral Yes  Mobility visit 1 Mobility  Mobility Specialist Start Time (ACUTE ONLY) 1108  Mobility Specialist Stop Time (ACUTE ONLY) 1133  Mobility Specialist Time Calculation (min) (ACUTE ONLY) 25 min   Pt agreeable to mobility. Required MinA for bed mobility, MinG during ambulation. BM upon standing requiring pericare. Able to ambulate w/o fault. Requested to sit in chair at [facility]. Pt left with all needs met, alarm on. Student RN in room.   Lauraine Erm Mobility Specialist Please contact via SecureChat or [employer] [phone]

## 2024-04-02 NOTE — Progress Notes (Signed)
 Progress Note   Subjective  Patient denies complaints. On clear liquids. Hgb stable.    Objective   Vital signs in last 24 hours: Temp:  [97.8 F (36.6 C)-98.6 F (37 C)] 97.8 F (36.6 C) (10/09 0727) Pulse Rate:  [59-62] 59 (10/09 0727) Resp:  [16-20] 18 (10/09 0727) BP: (140-155)/(52-68) 142/52 (10/09 0727) SpO2:  [91 %-99 %] 99 % (10/09 0727) Weight:  [53.6 kg] 53.6 kg (10/09 0352) Last BM Date : 04/02/24 General:    white male in NAD Neurologic:  Alert and oriented,  grossly normal neurologically. Psych:  Cooperative. Normal mood and affect.  Intake/Output from previous day: 10/08 0701 - 10/09 0700 In: 720 [P.O.:720] Out: 1300 [Urine:1300] Intake/Output this shift: No intake/output data recorded.  Lab Results: Recent Labs    03/31/24 0531 03/31/24 0900 04/01/24 0929 04/01/24 1439 04/02/24 0416  WBC 12.4*  --  7.7  --  8.1  HGB 8.9*   < > 7.9* 7.9* 7.9*  HCT 26.4*   < > 23.8* 23.4* 23.5*  PLT 104*  --  107*  --  110*   < > = values in this interval not displayed.   BMET Recent Labs    03/31/24 0531 04/01/24 0929 04/02/24 0416  NA 144 138 139  K 3.9 3.7 3.2*  CL 117* 109 110  CO2 18* 20* 21*  GLUCOSE 96 221* 96  BUN 66* 33* 22  CREATININE 1.71* 1.58* 1.36*  CALCIUM  7.9* 7.6* 7.6*   LFT Recent Labs    04/02/24 0416  PROT 3.9*  ALBUMIN 2.2*  AST 34  ALT 35  ALKPHOS 49  BILITOT 1.0   PT/INR No results for input(s): LABPROT, INR in the last 72 hours.  Studies/Results: No results found.     Assessment / Plan:    83 year old male here for the following:   Upper GI bleed secondary to friable gastric lesion - polyp vs. possible underlying malignancy Anemia Chronic anticoagulation - Eliquis  for history of atrial fibrillation   EGD on 10/6 showed a friable gastric polypoid lesion.  Biopsied and treated with Pura stat for temporizing measures.  He had a large burden of blood clot in the fundus that could not be cleared at the  time of that exam.  Superficial biopsies show a hyperplastic gastric polyp, no overt malignancy, however there is an ulcerated base to this and underlying malignancy remains possible.   See yesterday's note for details of his case. He needs definitive removal / treatment of this lesion or he will continue to bleed, and ideally would benefit from resumption of anticoagulation long term to reduce his risk for CVA. Ideally would like an EUS to assess depth of this lesion and assess safety for endoscopic resection, given risks of bleeding with removing this. Fortunately Dr. Rollin is able to us  tomorrow for EUS, and patient is scheduled for repeat EGD with EUS, possible endoscopic resection of the lesion pending findings. We will also clear the rest of his stomach during this exam, hopefully clot burden has passed. BUN has downtrended nicely.   I again have discussed risks / benefits of EUS / EGD, endoscopic resection with the patient and his family, they understand and wish to proceed. They understand if the exam shows concerning changes for malignancy we may not remove the lesion and perhaps more biopsies will be taken.  Continue to hold Eliquis  for now. Clear liquids okay today, NPO after MN. EGD / EUS tentatively at 1 PM tomorrow.  Trend labs in the interim.   Call with questions.  Marcey Naval, MD [facility]

## 2024-04-02 NOTE — H&P (View-Only) (Signed)
 Progress Note   Subjective  Patient denies complaints. On clear liquids. Hgb stable.    Objective   Vital signs in last 24 hours: Temp:  [97.8 F (36.6 C)-98.6 F (37 C)] 97.8 F (36.6 C) (10/09 0727) Pulse Rate:  [59-62] 59 (10/09 0727) Resp:  [16-20] 18 (10/09 0727) BP: (140-155)/(52-68) 142/52 (10/09 0727) SpO2:  [91 %-99 %] 99 % (10/09 0727) Weight:  [53.6 kg] 53.6 kg (10/09 0352) Last BM Date : 04/02/24 General:    white male in NAD Neurologic:  Alert and oriented,  grossly normal neurologically. Psych:  Cooperative. Normal mood and affect.  Intake/Output from previous day: 10/08 0701 - 10/09 0700 In: 720 [P.O.:720] Out: 1300 [Urine:1300] Intake/Output this shift: No intake/output data recorded.  Lab Results: Recent Labs    03/31/24 0531 03/31/24 0900 04/01/24 0929 04/01/24 1439 04/02/24 0416  WBC 12.4*  --  7.7  --  8.1  HGB 8.9*   < > 7.9* 7.9* 7.9*  HCT 26.4*   < > 23.8* 23.4* 23.5*  PLT 104*  --  107*  --  110*   < > = values in this interval not displayed.   BMET Recent Labs    03/31/24 0531 04/01/24 0929 04/02/24 0416  NA 144 138 139  K 3.9 3.7 3.2*  CL 117* 109 110  CO2 18* 20* 21*  GLUCOSE 96 221* 96  BUN 66* 33* 22  CREATININE 1.71* 1.58* 1.36*  CALCIUM  7.9* 7.6* 7.6*   LFT Recent Labs    04/02/24 0416  PROT 3.9*  ALBUMIN 2.2*  AST 34  ALT 35  ALKPHOS 49  BILITOT 1.0   PT/INR No results for input(s): LABPROT, INR in the last 72 hours.  Studies/Results: No results found.     Assessment / Plan:    83 year old male here for the following:   Upper GI bleed secondary to friable gastric lesion - polyp vs. possible underlying malignancy Anemia Chronic anticoagulation - Eliquis  for history of atrial fibrillation   EGD on 10/6 showed a friable gastric polypoid lesion.  Biopsied and treated with Pura stat for temporizing measures.  He had a large burden of blood clot in the fundus that could not be cleared at the  time of that exam.  Superficial biopsies show a hyperplastic gastric polyp, no overt malignancy, however there is an ulcerated base to this and underlying malignancy remains possible.   See yesterday's note for details of his case. He needs definitive removal / treatment of this lesion or he will continue to bleed, and ideally would benefit from resumption of anticoagulation long term to reduce his risk for CVA. Ideally would like an EUS to assess depth of this lesion and assess safety for endoscopic resection, given risks of bleeding with removing this. Fortunately Dr. Rollin is able to us  tomorrow for EUS, and patient is scheduled for repeat EGD with EUS, possible endoscopic resection of the lesion pending findings. We will also clear the rest of his stomach during this exam, hopefully clot burden has passed. BUN has downtrended nicely.   I again have discussed risks / benefits of EUS / EGD, endoscopic resection with the patient and his family, they understand and wish to proceed. They understand if the exam shows concerning changes for malignancy we may not remove the lesion and perhaps more biopsies will be taken.  Continue to hold Eliquis  for now. Clear liquids okay today, NPO after MN. EGD / EUS tentatively at 1 PM tomorrow.  Trend labs in the interim.   Call with questions.  Marcey Naval, MD [facility]

## 2024-04-02 NOTE — Clinical Note (Incomplete)
 [email or web address]

## 2024-04-02 NOTE — Progress Notes (Signed)
 [facility]  PROGRESS NOTE  Bryan Carney FMW:[id number] DOB: December 07, 1940 DOA: 03/29/2024 PCP: Erick Greig LABOR, NP   Brief HPI:    89M h/o CAD s/p CABG, PAF on chronic anticoagulation, SSS s/p PPM, severe AS s/p TAVR in 2022, PE, CVA in setting of carotid artery stenosis s/p CEA, DM2, HTN, HLD, COPD, and CKD3 p/w hematemesis c/f UGIB       Assessment/Plan:   Hematemesis with UGI bleed and acute blood loss anemia -GI consulted -Pt noted to be anemic, requiring total 4 units PRBC's thus far this visit -Pt underwent EGD, results reviewed. Findings notable for a gastric lesion in the distal gastric body/prox antrum with old clot in stomach. Biopsy results are positive for gastric hyperplastic polyp, negative for dysplasia or metaplasia -findings are benign, plan for  endoscopic removal of lesion in a.m.  -Continuing to hold anticoag for now - Hemoglobin is stable at 7.9 -Will give additional unit of PRBC today; will amlodipine  dose of Lasix  20 mg IV - Follow hemoglobin a.m.  PAF -HOLD pta Eliquis    HFpEF -HOLD pta ASA, lasix , and losartan  per UGIB above -PTA amiodarone  200mg  daily  -PTA Crestor  -1 dose of Lasix  given as above  Hypokalemia -Potassium is 3.2, will replace potassium and check BMP today in the evening    DM2 -PTA glimeperide 4mg  daily -SSI TID AC prn -Hold glimepiride         Medications     sodium chloride    Intravenous Once   sodium chloride    Intravenous Once   amiodarone   200 mg Oral Daily   furosemide   20 mg Intravenous Once   insulin  aspart  0-15 Units Subcutaneous TID WC   nystatin  5 mL Oral QID   pantoprazole  (PROTONIX ) IV  40 mg Intravenous Q12H   polyethylene glycol  17 g Oral BID   rosuvastatin   10 mg Oral Daily   senna-docusate  2 tablet Oral BID     Data Reviewed:   CBG:  Recent Labs  Lab 04/01/24 1634 04/01/24 2143 04/02/24 0738 04/02/24 1147 04/02/24 1536  GLUCAP 79 132* 95 175* 147*    SpO2: 98 % O2 Flow Rate  (L/min): 4 L/min    Vitals:   04/02/24 1145 04/02/24 1535 04/02/24 1607 04/02/24 1622  BP:   (!) 158/57 (!) 160/59  Pulse: 62  62 61  Resp:  18 16 16   Temp:  98 F (36.7 C) 98.9 F (37.2 C) 98.8 F (37.1 C)  TempSrc:  Oral Oral Oral  SpO2: 99%  99% 98%  Weight:      Height:          Data Reviewed:  Basic Metabolic Panel: Recent Labs  Lab 03/29/24 0402 03/30/24 1346 03/31/24 0531 04/01/24 0929 04/02/24 0416  NA 140 141 144 138 139  K 3.8 4.2 3.9 3.7 3.2*  CL 108 114* 117* 109 110  CO2 21* 18* 18* 20* 21*  GLUCOSE 189* 212* 96 221* 96  BUN 64* 73* 66* 33* 22  CREATININE 1.93* 1.94* 1.71* 1.58* 1.36*  CALCIUM  8.2* 7.4* 7.9* 7.6* 7.6*    CBC: Recent Labs  Lab 03/29/24 1608 03/29/24 1759 03/30/24 0559 03/30/24 1346 03/31/24 0531 03/31/24 0900 03/31/24 1508 03/31/24 2050 04/01/24 0929 04/01/24 1439 04/02/24 0416  WBC 8.3  --  11.8*  --  12.4*  --   --   --  7.7  --  8.1  HGB 6.6*   < > 6.6*   < > 8.9*   < >  8.3* 7.9* 7.9* 7.9* 7.9*  HCT 20.5*   < > 20.0*   < > 26.4*   < > 24.3* 23.9* 23.8* 23.4* 23.5*  MCV 100.5*  --  95.2  --  92.0  --   --   --  92.6  --  92.9  PLT 115*  --  114*  --  104*  --   --   --  107*  --  110*   < > = values in this interval not displayed.    LFT Recent Labs  Lab 03/29/24 0402 03/31/24 0531 04/01/24 0929 04/02/24 0416  AST 19 19 46* 34  ALT 15 16 33 35  ALKPHOS 54 42 47 49  BILITOT 0.8 1.2 0.9 1.0  PROT 4.5* 4.0* 3.9* 3.9*  ALBUMIN 2.6* 2.2* 2.2* 2.2*     Antibiotics: Anti-infectives (From admission, onward)    Start     Dose/Rate Route Frequency Ordered Stop   03/29/24 0700  piperacillin -tazobactam (ZOSYN ) IVPB 3.375 g        3.375 g 100 mL/hr over 30 Minutes Intravenous  Once 03/29/24 0655 03/29/24 0746        DVT prophylaxis: SCDs  Code Status: Full code  Family Communication: Discussed with patient's daughter at bedside   CONSULTS gastroenterology   Subjective   Denies any  complaints   Objective    Physical Examination:   General-appears in no acute distress Heart-S1-S2, regular, no murmur auscultated Lungs-clear to auscultation bilaterally, no wheezing or crackles auscultated Abdomen-soft, nontender, no organomegaly Extremities-no edema in the lower extremities Neuro-alert, oriented x3, no focal deficit noted  Status is: Inpatient:             Bryan Carney   [facility] If 7PM-7AM, please contact night-coverage at [email or web address], Office  [phone]   04/02/2024, 5:02 PM  LOS: 3 days

## 2024-04-02 NOTE — Progress Notes (Signed)
 Pharmacy Electrolyte Replacement  Recent Labs:  Recent Labs    04/02/24 2220  K 3.9  MG 1.9  CREATININE 1.28*    Low Critical Values (K </= 2.5, Phos </= 1, Mg </= 1) Present: None  Plan: Potassium 40 mEq PO x1 and magnesium  2g IV x1   Marvetta Dauphin, PharmD, BCPS 04/02/2024 11:03 PM

## 2024-04-03 ENCOUNTER — Inpatient Hospital Stay (HOSPITAL_COMMUNITY)

## 2024-04-03 ENCOUNTER — Encounter (HOSPITAL_COMMUNITY): Payer: Self-pay | Admitting: Internal Medicine

## 2024-04-03 ENCOUNTER — Encounter (HOSPITAL_COMMUNITY)
Admission: EM | Disposition: A | Discharge status: Home / Self Care | Payer: Self-pay | Source: Home / Self Care | Attending: Family Medicine

## 2024-04-03 DIAGNOSIS — K922 Gastrointestinal hemorrhage, unspecified: Secondary | ICD-10-CM | POA: Diagnosis not present

## 2024-04-03 DIAGNOSIS — K3189 Other diseases of stomach and duodenum: Secondary | ICD-10-CM | POA: Diagnosis not present

## 2024-04-03 DIAGNOSIS — Z7901 Long term (current) use of anticoagulants: Secondary | ICD-10-CM | POA: Diagnosis not present

## 2024-04-03 DIAGNOSIS — K317 Polyp of stomach and duodenum: Secondary | ICD-10-CM

## 2024-04-03 DIAGNOSIS — I25119 Atherosclerotic heart disease of native coronary artery with unspecified angina pectoris: Secondary | ICD-10-CM

## 2024-04-03 DIAGNOSIS — D649 Anemia, unspecified: Secondary | ICD-10-CM | POA: Diagnosis not present

## 2024-04-03 HISTORY — PX: ESOPHAGOGASTRODUODENOSCOPY: SHX5428

## 2024-04-03 LAB — BPAM RBC
Blood Product Expiration Date: 202510202359
ISSUE DATE / TIME: 202510091555
Unit Type and Rh: 9500

## 2024-04-03 LAB — TYPE AND SCREEN
ABO/RH(D): O NEG
Antibody Screen: NEGATIVE
Unit division: 0

## 2024-04-03 LAB — GLUCOSE, CAPILLARY
Glucose-Capillary: 88 mg/dL (ref 70–99)
Glucose-Capillary: 119 mg/dL — ABNORMAL HIGH (ref 70–99)
Glucose-Capillary: 309 mg/dL — ABNORMAL HIGH (ref 70–99)
Glucose-Capillary: 132 mg/dL — ABNORMAL HIGH (ref 70–99)

## 2024-04-03 LAB — CBC
HCT: 30.2 % — ABNORMAL LOW (ref 39.0–52.0)
Hemoglobin: 10.5 g/dL — ABNORMAL LOW (ref 13.0–17.0)
MCH: 31.5 pg (ref 26.0–34.0)
MCHC: 34.8 g/dL (ref 30.0–36.0)
MCV: 90.7 fL (ref 80.0–100.0)
Platelets: 112 K/uL — ABNORMAL LOW (ref 150–400)
RBC: 3.33 MIL/uL — ABNORMAL LOW (ref 4.22–5.81)
RDW: 18.3 % — ABNORMAL HIGH (ref 11.5–15.5)
WBC: 8.9 K/uL (ref 4.0–10.5)
nRBC: 0 % (ref 0.0–0.2)

## 2024-04-03 LAB — BASIC METABOLIC PANEL WITH GFR
Anion gap: 8 (ref 5–15)
BUN: 13 mg/dL (ref 8–23)
CO2: 24 mmol/L (ref 22–32)
Calcium: 8.1 mg/dL — ABNORMAL LOW (ref 8.9–10.3)
Chloride: 106 mmol/L (ref 98–111)
Creatinine, Ser: 1.46 mg/dL — ABNORMAL HIGH (ref 0.61–1.24)
GFR, Estimated: 47 mL/min — ABNORMAL LOW (ref >60)
Glucose, Bld: 96 mg/dL (ref 70–99)
Potassium: 4.7 mmol/L (ref 3.5–5.1)
Sodium: 138 mmol/L (ref 135–145)

## 2024-04-03 SURGERY — EGD (ESOPHAGOGASTRODUODENOSCOPY)
Anesthesia: Monitor Anesthesia Care

## 2024-04-03 MED ORDER — SODIUM CHLORIDE 0.9 % IV SOLN: INTRAVENOUS | Status: DC

## 2024-04-03 MED ORDER — BENZONATATE 100 MG PO CAPS
100.0000 mg | ORAL_CAPSULE | Freq: Three times a day (TID) | ORAL | Status: DC | PRN
  Administered 2024-04-03 – 2024-04-05 (×3): 100 mg via ORAL
  Filled 2024-04-03 (×3): qty 1

## 2024-04-03 MED ORDER — ENSURE PLUS HIGH PROTEIN PO LIQD
237.0000 mL | Freq: Two times a day (BID) | ORAL | Status: DC
Start: 2024-04-04 — End: 2024-04-07
  Administered 2024-04-04 – 2024-04-06 (×5): 237 mL via ORAL

## 2024-04-03 MED ORDER — SODIUM CHLORIDE 0.9 % IV SOLN
INTRAVENOUS | Status: AC | PRN
  Administered 2024-04-03: 500 mL via INTRAVENOUS

## 2024-04-03 MED ORDER — PROPOFOL 500 MG/50ML IV EMUL
INTRAVENOUS | Status: DC | PRN
  Administered 2024-04-03: 20 mg via INTRAVENOUS
  Administered 2024-04-03: 20 ug/kg/min via INTRAVENOUS

## 2024-04-03 MED ORDER — EPINEPHRINE 1 MG/10ML IV SOSY
PREFILLED_SYRINGE | INTRAVENOUS | Status: AC
  Filled 2024-04-03: qty 10

## 2024-04-03 MED ORDER — LOSARTAN POTASSIUM 25 MG PO TABS
25.0000 mg | ORAL_TABLET | Freq: Every day | ORAL | Status: DC
  Administered 2024-04-03 – 2024-04-07 (×5): 25 mg via ORAL
  Filled 2024-04-03 (×5): qty 1

## 2024-04-03 MED ORDER — SODIUM CHLORIDE (PF) 0.9 % IJ SOLN
PREFILLED_SYRINGE | INTRAVENOUS | Status: DC | PRN
Start: 2024-04-03 — End: 2024-04-03
  Administered 2024-04-03: 4 mL

## 2024-04-03 MED ORDER — PHENYLEPHRINE 80 MCG/ML (10ML) SYRINGE FOR IV PUSH (FOR BLOOD PRESSURE SUPPORT)
PREFILLED_SYRINGE | INTRAVENOUS | Status: DC | PRN
  Administered 2024-04-03: 160 ug via INTRAVENOUS
  Administered 2024-04-03: 80 ug via INTRAVENOUS

## 2024-04-03 NOTE — Progress Notes (Signed)
 [facility]  PROGRESS NOTE  Bryan Carney FMW:[id number] DOB: 1940/08/24 DOA: 03/29/2024 PCP: Erick Greig LABOR, NP   Brief HPI:    89M h/o CAD s/p CABG, PAF on chronic anticoagulation, SSS s/p PPM, severe AS s/p TAVR in 2022, PE, CVA in setting of carotid artery stenosis s/p CEA, DM2, HTN, HLD, COPD, and CKD3 p/w hematemesis c/f UGIB       Assessment/Plan:   Hematemesis with UGI bleed and acute blood loss anemia -GI consulted -Pt noted to be anemic, requiring total 4 units PRBC's thus far this visit -Pt underwent EGD, results reviewed. Findings notable for a gastric lesion in the distal gastric body/prox antrum with old clot in stomach. Biopsy results are positive for gastric hyperplastic polyp, negative for dysplasia or metaplasia -findings are benign, plan for  endoscopic removal of lesion  -Continuing to hold anticoag for now -Status post 1 unit PRBC, hemoglobin is up to 10.5 today. -Lasix  20 mg IV given yesterday.   PAF -HOLD pta Eliquis    HFpEF -HOLD pta ASA, lasix , and losartan  per UGIB above -PTA amiodarone  200mg  daily  -PTA Crestor  -1 dose of Lasix  given as above  Hypokalemia - Potassium is 4.7    DM2 -PTA glimeperide 4mg  daily -SSI TID AC prn -Hold glimepiride         Medications     sodium chloride    Intravenous Once   sodium chloride    Intravenous Once   amiodarone   200 mg Oral Daily   insulin  aspart  0-15 Units Subcutaneous TID WC   losartan   25 mg Oral Daily   nystatin  5 mL Oral QID   pantoprazole  (PROTONIX ) IV  40 mg Intravenous Q12H   polyethylene glycol  17 g Oral BID   rosuvastatin   10 mg Oral Daily   senna-docusate  2 tablet Oral BID     Data Reviewed:   CBG:  Recent Labs  Lab 04/02/24 1147 04/02/24 1536 04/02/24 2058 04/03/24 0736 04/03/24 1221  GLUCAP 175* 147* 103* 88 119*    SpO2: 95 % O2 Flow Rate (L/min): 4 L/min    Vitals:   04/03/24 1325 04/03/24 1330 04/03/24 1335 04/03/24 1340  BP: (!) 135/58 (!) 130/58  (!)  144/64  Pulse: 60 60 (!) 59 63  Resp: 20 18 19  (!) 25  Temp: (!) 97 F (36.1 C)     TempSrc: Temporal     SpO2: 96% 94% 95% 95%  Weight:      Height:          Data Reviewed:  Basic Metabolic Panel: Recent Labs  Lab 03/31/24 0531 04/01/24 0929 04/02/24 0416 04/02/24 2220 04/03/24 0447  NA 144 138 139 137 138  K 3.9 3.7 3.2* 3.9 4.7  CL 117* 109 110 106 106  CO2 18* 20* 21* 22 24  GLUCOSE 96 221* 96 92 96  BUN 66* 33* 22 16 13   CREATININE 1.71* 1.58* 1.36* 1.28* 1.46*  CALCIUM  7.9* 7.6* 7.6* 7.7* 8.1*  MG  --   --   --  1.9  --     CBC: Recent Labs  Lab 03/30/24 0559 03/30/24 1346 03/31/24 0531 03/31/24 0900 03/31/24 2050 04/01/24 0929 04/01/24 1439 04/02/24 0416 04/03/24 0915  WBC 11.8*  --  12.4*  --   --  7.7  --  8.1 8.9  HGB 6.6*   < > 8.9*   < > 7.9* 7.9* 7.9* 7.9* 10.5*  HCT 20.0*   < > 26.4*   < > 23.9*  23.8* 23.4* 23.5* 30.2*  MCV 95.2  --  92.0  --   --  92.6  --  92.9 90.7  PLT 114*  --  104*  --   --  107*  --  110* 112*   < > = values in this interval not displayed.    LFT Recent Labs  Lab 03/29/24 0402 03/31/24 0531 04/01/24 0929 04/02/24 0416  AST 19 19 46* 34  ALT 15 16 33 35  ALKPHOS 54 42 47 49  BILITOT 0.8 1.2 0.9 1.0  PROT 4.5* 4.0* 3.9* 3.9*  ALBUMIN 2.6* 2.2* 2.2* 2.2*     Antibiotics: Anti-infectives (From admission, onward)    Start     Dose/Rate Route Frequency Ordered Stop   03/29/24 0700  piperacillin -tazobactam (ZOSYN ) IVPB 3.375 g        3.375 g 100 mL/hr over 30 Minutes Intravenous  Once 03/29/24 0655 03/29/24 0746        DVT prophylaxis: SCDs  Code Status: Full code  Family Communication: Discussed with patient's daughter at bedside   CONSULTS gastroenterology   Subjective   Patient seen and examined, awaiting for EGD today.  Status post 1 unit PRBC given yesterday, hemoglobin is up to 7.5 today  Objective    Physical Examination:   General-appears in no acute distress Heart-S1-S2,  regular, no murmur auscultated Lungs-clear to auscultation bilaterally, no wheezing or crackles auscultated Abdomen-soft, nontender, no organomegaly Extremities-no edema in the lower extremities Neuro-alert, oriented x3, no focal deficit noted  Status is: Inpatient:             [name]   [facility] If 7PM-7AM, please contact night-coverage at [email or web address], Office  [phone]   04/03/2024, 3:31 PM  LOS: 4 days

## 2024-04-03 NOTE — Plan of Care (Signed)
  Problem: Education: Goal: Ability to describe self-care measures that may prevent or decrease complications (Diabetes Survival Skills Education) will improve Outcome: Progressing Goal: Individualized Educational Video(s) Outcome: Progressing   Problem: Coping: Goal: Ability to adjust to condition or change in health will improve Outcome: Progressing   Problem: Fluid Volume: Goal: Ability to maintain a balanced intake and output will improve Outcome: Progressing   Problem: Health Behavior/Discharge Planning: Goal: Ability to identify and utilize available resources and services will improve Outcome: Progressing Goal: Ability to manage health-related needs will improve Outcome: Progressing   Problem: Metabolic: Goal: Ability to maintain appropriate glucose levels will improve Outcome: Progressing   Problem: Nutritional: Goal: Maintenance of adequate nutrition will improve Outcome: Progressing Goal: Progress toward achieving an optimal weight will improve Outcome: Progressing   Problem: Skin Integrity: Goal: Risk for impaired skin integrity will decrease Outcome: Progressing   Problem: Education: Goal: Knowledge of General Education information will improve Description: Including pain rating scale, medication(s)/side effects and non-pharmacologic comfort measures Outcome: Progressing   Problem: Activity: Goal: Risk for activity intolerance will decrease Outcome: Progressing   Problem: Nutrition: Goal: Adequate nutrition will be maintained Outcome: Progressing   Problem: Elimination: Goal: Will not experience complications related to bowel motility Outcome: Progressing Goal: Will not experience complications related to urinary retention Outcome: Progressing   Problem: Bowel/Gastric: Goal: Will show no signs and symptoms of gastrointestinal bleeding Outcome: Progressing

## 2024-04-03 NOTE — Transfer of Care (Signed)
 Immediate Anesthesia Transfer of Care Note  Patient: Bryan Carney  Procedure(s) Performed: EGD (ESOPHAGOGASTRODUODENOSCOPY)  Patient Location: Endoscopy Unit  Anesthesia Type:MAC  Level of Consciousness: drowsy  Airway & Oxygen Therapy: Patient Spontanous Breathing  Post-op Assessment: Report given to RN and Post -op Vital signs reviewed and stable  Post vital signs: Reviewed and stable  Last Vitals:  Vitals Value Taken Time  BP 135/58 04/03/24 13:25  Temp    Pulse 59 04/03/24 13:29  Resp 17 04/03/24 13:29  SpO2 95 % 04/03/24 13:29  Vitals shown include unfiled device data.  Last Pain:  Vitals:   04/03/24 1325  TempSrc:   PainSc: Asleep      Patients Stated Pain Goal: 0 (03/30/24 1949)  Complications: No notable events documented.

## 2024-04-03 NOTE — Progress Notes (Signed)
 Mobility Specialist Progress Note;   04/03/24 1126  Mobility  Activity Ambulated with assistance;Pivoted/transferred from bed to chair  Level of Assistance Contact guard assist, steadying assist  Assistive Device Front wheel walker  Distance Ambulated (ft) 400 ft  Activity Response Tolerated well  Mobility Referral Yes  Mobility visit 1 Mobility  Mobility Specialist Start Time (ACUTE ONLY) 1126  Mobility Specialist Stop Time (ACUTE ONLY) 1138  Mobility Specialist Time Calculation (min) (ACUTE ONLY) 12 min   Pt eager for mobility. Required light MinG assistance for all mobility this session, really progressing well. VSS throughout and no c/o when asked. Requested to sit in chair at [facility]. Pt left with all needs met, call bell in reach. Family present.   Lauraine Erm Mobility Specialist Please contact via SecureChat or [employer] [phone]

## 2024-04-03 NOTE — Op Note (Addendum)
 [facility] Patient Name: Bryan Carney Procedure Date : 04/03/2024 MRN: [id number] Attending MD: Elspeth SQUIBB. Leigh , MD, [id number] Date of Birth: 1941-06-12 CSN: [id number] Age: 83 Admit Type: Inpatient Procedure:                Upper GI endoscopy Indications:              for reassessment / removal of gastric polyp vs.                            mass. EGD planned with EUS. Last EGD limited by                            significant blood products in the stomach. Biopsies                            were c/w benign hyperplastic polyp Providers:                Elspeth P. Leigh, MD, Jacquelyn Jaci Pierce,                            RN, Farris Southgate, Technician Referring MD:              Medicines:                Monitored Anesthesia Care Complications:            No immediate complications. Estimated blood loss:                            Minimal. Estimated Blood Loss:     Estimated blood loss was minimal. Procedure:                Pre-Anesthesia Assessment:                           - Prior to the procedure, a History and Physical                            was performed, and patient medications and                            allergies were reviewed. The patient's tolerance of                            previous anesthesia was also reviewed. The risks                            and benefits of the procedure and the sedation                            options and risks were discussed with the patient.                            All questions were answered, and informed consent  was obtained. Prior Anticoagulants: The patient has                            taken Eliquis  (apixaban ), last dose was 6 days                            prior to procedure. ASA Grade Assessment: IV - A                            patient with severe systemic disease that is a                            constant threat to life. After reviewing the risks                             and benefits, the patient was deemed in                            satisfactory condition to undergo the procedure.                           After obtaining informed consent, the endoscope was                            passed under direct vision. Throughout the                            procedure, the patient's blood pressure, pulse, and                            oxygen saturations were monitored continuously. The                            [id number]-H190 ([id number]) Olympus endoscope was introduced                            through the mouth, and advanced to the second part                            of duodenum. The upper GI endoscopy was                            accomplished without difficulty. The patient                            tolerated the procedure well. Scope In: Scope Out: Findings:      The Z-line was regular.      A small hiatal hernia was present.      The exam of the esophagus was otherwise normal.      A single large pedunculated polyp with stigmata of recent bleeding was       found in the gastric body. The head was large and ulcerated (4cms or       so). I could better visualize the  base of the lesion on this exam and it       was clearly pedunculated with a multilobulated head. The stalk was       rather thick. I was able to view it in multiple ways to confirm this, it       did not appear to have any deeper extension. As such, EUS was not       performed. The stalk was successfully injected with 4 mL of a 0.1 mg/mL       solution of epinephrine for drug delivery, to help shrink the polyp head       and reduce risk for bleeding. The polyp was then removed with a hot       snare. Resection and retrieval were complete. The stalk was thick and       there was some mild oozing with polypectomy. To close the defect,       achieve hemostasis, and prevent bleeding after the polypectomy, seven       hemostatic clips were successfully placed across the site. There  was no       bleeding at the end of the procedure.      The exam of the stomach was otherwise normal. The fundus was well       visualized on this exam, blood products had cleared and no concerning       pathology there.      The examined duodenum was normal. Impression:               - Z-line regular.                           - Small hiatal hernia.                           - Normal esophagus otherwise                           - A single gastric polyp. Large head with thick                            stalk, pedunculated, better visualized on this exam                            as outlined. Injected with epinephrine, resected                            and retrieved. 7 clips were placed.                           - Normal stomach otherwise, clot burden has since                            passed.                           - Normal examined duodenum. Recommendation:           - Return patient to hospital ward for ongoing care.                           -  Full liquid diet now, advance diet tomorrow as                            tolerated                           - Continue present medications.                           - Continue to hold Eliquis  post polypectomy                           - Protonix  40mg  BID for 4 weeks to help promote                            healing and reduce risk for post polypectomy                            bleeding                           - Await pathology results.                           - We will reassess the patient tomorrow, anticipate                            discharge over the weekend Procedure Code(s):        --- Professional ---                           [phone], Esophagogastroduodenoscopy, flexible,                            transoral; with removal of tumor(s), polyp(s), or                            other lesion(s) by snare technique                           [id number], Esophagogastroduodenoscopy, flexible,                            transoral;  with directed submucosal injection(s),                            any substance Diagnosis Code(s):        --- Professional ---                           K44.9, Diaphragmatic hernia without obstruction or                            gangrene                           K31.7, Polyp of stomach and duodenum CPT copyright 2022 American Medical Association.  All rights reserved. The codes documented in this report are preliminary and upon coder review may  be revised to meet current compliance requirements. Elspeth P. Adisa Litt, MD 04/03/2024 1:30:13 PM This report has been signed electronically. Number of Addenda: 0

## 2024-04-03 NOTE — Interval H&P Note (Signed)
 History and Physical Interval Note: Patient here for EGD / EUS, possible resection of gastric lesion. I have spoken with the patient and his family at length about this. Plan for EGD first to clear his stomach of clots and ensure no other pathology, then plan for EUS of this lesion to determine depth and safety for resection, and then removal if appropriate. Risks of this is mainly bleeding and perforation. They understand these risks, wish to proceed. Eliquis  is out of his system. Further recommendations pending the results.   04/03/2024 12:29 PM  Bryan Carney  has presented today for surgery, with the diagnosis of gastric polyp / mass.  The various methods of treatment have been discussed with the patient and family. After consideration of risks, benefits and other options for treatment, the patient has consented to  Procedure(s): EGD (ESOPHAGOGASTRODUODENOSCOPY) (N/A) ULTRASOUND, UPPER GI TRACT, ENDOSCOPIC (N/A) as a surgical intervention.  The patient's history has been reviewed, patient examined, no change in status, stable for surgery.  I have reviewed the patient's chart and labs.  Questions were answered to the patient's satisfaction.     Elspeth P Jennilee Demarco

## 2024-04-03 NOTE — Anesthesia Preprocedure Evaluation (Signed)
 Anesthesia Evaluation  Patient identified by MRN, date of birth, ID band Patient awake  General Assessment Comment:  Patient had EGD attempt 4 days ago  Reviewed: Allergy & Precautions, H&P , NPO status , Patient's Chart, lab work & pertinent test results  History of Anesthesia Complications Negative for: history of anesthetic complications  Airway Mallampati: I  TM Distance: >3 FB Neck ROM: Full    Dental no notable dental hx. (+) Lower Dentures, Upper Dentures   Pulmonary neg pulmonary ROS, pneumonia, COPD   Pulmonary exam normal breath sounds clear to auscultation       Cardiovascular hypertension, Pt. on medications + angina  + CAD, + Past MI, + Cardiac Stents, + CABG and +CHF  negative cardio ROS Normal cardiovascular exam+ dysrhythmias Atrial Fibrillation + pacemaker + Valvular Problems/Murmurs AS  Rhythm:Regular Rate:Normal  ECHO 1/23 1. Left ventricular ejection fraction, by estimation, is 40 to 45%. The  left ventricle has mildly decreased function. The left ventricle  demonstrates global hypokinesis with septal lateral dyssynchrony  suggesting interventricular conduction delay. There   is moderate left ventricular hypertrophy. Left ventricular diastolic  parameters are consistent with Grade I diastolic dysfunction (impaired  relaxation).   2. Right ventricular systolic function is normal. The right ventricular  size is normal. There is normal pulmonary artery systolic pressure. The  estimated right ventricular systolic pressure is 26.4 mmHg.   3. Left atrial size was moderately dilated.   4. The mitral valve is normal in structure. Trivial mitral valve  regurgitation. No evidence of mitral stenosis. Moderate mitral annular  calcification.   5. Bioprosthetic aortic valve s/p TAVR. 29 mm Edwards Sapien THV. Mean  gradient 6 mmHg with DI 0.54, no significant stenosis. No peri-valvlular  leakage visualized.       Neuro/Psych  Neuromuscular disease CVA, No Residual Symptoms negative neurological ROS  negative psych ROS   GI/Hepatic negative GI ROS, Neg liver ROS,GERD  Medicated,,  Endo/Other  negative endocrine ROSdiabetes    Renal/GU Renal InsufficiencyRenal diseasenegative Renal ROS  negative genitourinary   Musculoskeletal negative musculoskeletal ROS (+) Arthritis ,  Fibromyalgia -  Abdominal   Peds negative pediatric ROS (+)  Hematology negative hematology ROS (+) Blood dyscrasia, anemia   Anesthesia Other Findings   Reproductive/Obstetrics negative OB ROS                              Anesthesia Physical Anesthesia Plan  ASA: 4 and emergent  Anesthesia Plan: MAC   Post-op Pain Management: Minimal or no pain anticipated   Induction: Intravenous  PONV Risk Score and Plan: 1 and Propofol  infusion and Treatment may vary due to age or medical condition  Airway Management Planned: Nasal Cannula, Natural Airway and Simple Face Mask  Additional Equipment:   Intra-op Plan:   Post-operative Plan:   Informed Consent: I have reviewed the patients History and Physical, chart, labs and discussed the procedure including the risks, benefits and alternatives for the proposed anesthesia with the patient or authorized representative who has indicated his/her understanding and acceptance.     Dental advisory given  Plan Discussed with: CRNA and Anesthesiologist  Anesthesia Plan Comments: ( )         Anesthesia Quick Evaluation

## 2024-04-03 NOTE — Anesthesia Postprocedure Evaluation (Signed)
 Anesthesia Post Note  Patient: Bryan Carney  Procedure(s) Performed: EGD (ESOPHAGOGASTRODUODENOSCOPY)     Patient location during evaluation: Endoscopy Anesthesia Type: MAC Level of consciousness: awake and alert Pain management: pain level controlled Vital Signs Assessment: post-procedure vital signs reviewed and stable Respiratory status: spontaneous breathing, nonlabored ventilation, respiratory function stable and patient connected to nasal cannula oxygen Cardiovascular status: stable and blood pressure returned to baseline Postop Assessment: no apparent nausea or vomiting Anesthetic complications: no   No notable events documented.  Last Vitals:  Vitals:   04/03/24 1335 04/03/24 1340  BP:  (!) 144/64  Pulse: (!) 59 63  Resp: 19 (!) 25  Temp:    SpO2: 95% 95%    Last Pain:  Vitals:   04/03/24 1340  TempSrc:   PainSc: 0-No pain                 [name]

## 2024-04-04 DIAGNOSIS — N3000 Acute cystitis without hematuria: Secondary | ICD-10-CM

## 2024-04-04 DIAGNOSIS — D62 Acute posthemorrhagic anemia: Secondary | ICD-10-CM

## 2024-04-04 DIAGNOSIS — K3189 Other diseases of stomach and duodenum: Secondary | ICD-10-CM | POA: Diagnosis not present

## 2024-04-04 DIAGNOSIS — Z7901 Long term (current) use of anticoagulants: Secondary | ICD-10-CM | POA: Diagnosis not present

## 2024-04-04 DIAGNOSIS — D649 Anemia, unspecified: Secondary | ICD-10-CM | POA: Diagnosis not present

## 2024-04-04 DIAGNOSIS — K922 Gastrointestinal hemorrhage, unspecified: Secondary | ICD-10-CM | POA: Diagnosis not present

## 2024-04-04 DIAGNOSIS — K317 Polyp of stomach and duodenum: Secondary | ICD-10-CM

## 2024-04-04 LAB — BASIC METABOLIC PANEL WITH GFR
Anion gap: 9 (ref 5–15)
BUN: 16 mg/dL (ref 8–23)
CO2: 24 mmol/L (ref 22–32)
Calcium: 7.9 mg/dL — ABNORMAL LOW (ref 8.9–10.3)
Chloride: 104 mmol/L (ref 98–111)
Creatinine, Ser: 1.46 mg/dL — ABNORMAL HIGH (ref 0.61–1.24)
GFR, Estimated: 47 mL/min — ABNORMAL LOW (ref >60)
Glucose, Bld: 104 mg/dL — ABNORMAL HIGH (ref 70–99)
Potassium: 4.3 mmol/L (ref 3.5–5.1)
Sodium: 137 mmol/L (ref 135–145)

## 2024-04-04 LAB — URINALYSIS, ROUTINE W REFLEX MICROSCOPIC
Bilirubin Urine: NEGATIVE
Glucose, UA: >=500 mg/dL — AB
Ketones, ur: NEGATIVE mg/dL
Nitrite: POSITIVE — AB
Protein, ur: NEGATIVE mg/dL
Specific Gravity, Urine: 1.008 (ref 1.005–1.030)
WBC, UA: >50 WBC/hpf (ref 0–5)
pH: 6 (ref 5.0–8.0)

## 2024-04-04 LAB — CBC
HCT: 29.6 % — ABNORMAL LOW (ref 39.0–52.0)
Hemoglobin: 10.2 g/dL — ABNORMAL LOW (ref 13.0–17.0)
MCH: 31.7 pg (ref 26.0–34.0)
MCHC: 34.5 g/dL (ref 30.0–36.0)
MCV: 91.9 fL (ref 80.0–100.0)
Platelets: 122 K/uL — ABNORMAL LOW (ref 150–400)
RBC: 3.22 MIL/uL — ABNORMAL LOW (ref 4.22–5.81)
RDW: 17.3 % — ABNORMAL HIGH (ref 11.5–15.5)
WBC: 10.5 K/uL (ref 4.0–10.5)
nRBC: 0 % (ref 0.0–0.2)

## 2024-04-04 LAB — GLUCOSE, CAPILLARY
Glucose-Capillary: 116 mg/dL — ABNORMAL HIGH (ref 70–99)
Glucose-Capillary: 275 mg/dL — ABNORMAL HIGH (ref 70–99)
Glucose-Capillary: 168 mg/dL — ABNORMAL HIGH (ref 70–99)
Glucose-Capillary: 156 mg/dL — ABNORMAL HIGH (ref 70–99)

## 2024-04-04 MED ORDER — SODIUM CHLORIDE 0.9 % IV SOLN
1.0000 g | INTRAVENOUS | Status: DC
  Administered 2024-04-04 – 2024-04-07 (×4): 1 g via INTRAVENOUS
  Filled 2024-04-04 (×4): qty 10

## 2024-04-04 MED ORDER — PANTOPRAZOLE SODIUM 40 MG PO TBEC
40.0000 mg | DELAYED_RELEASE_TABLET | Freq: Two times a day (BID) | ORAL | Status: DC
  Administered 2024-04-04 – 2024-04-07 (×6): 40 mg via ORAL
  Filled 2024-04-04: qty 1
  Filled 2024-04-04: qty 2
  Filled 2024-04-04 (×4): qty 1

## 2024-04-04 NOTE — Plan of Care (Signed)
  Problem: Health Behavior/Discharge Planning: Goal: Ability to manage health-related needs will improve Outcome: Progressing   Problem: Clinical Measurements: Goal: Respiratory complications will improve Outcome: Progressing Goal: Cardiovascular complication will be avoided Outcome: Progressing   Problem: Activity: Goal: Risk for activity intolerance will decrease Outcome: Progressing   Problem: Nutrition: Goal: Adequate nutrition will be maintained Outcome: Progressing   Problem: Safety: Goal: Ability to remain free from injury will improve Outcome: Progressing

## 2024-04-04 NOTE — Progress Notes (Signed)
 Mobility Specialist Progress Note;   04/04/24 1006  Mobility  Activity Ambulated with assistance;Pivoted/transferred from bed to chair  Level of Assistance Contact guard assist, steadying assist  Assistive Device Front wheel walker  Distance Ambulated (ft) 400 ft  Activity Response Tolerated well  Mobility Referral Yes  Mobility visit 1 Mobility  Mobility Specialist Start Time (ACUTE ONLY) 1006  Mobility Specialist Stop Time (ACUTE ONLY) 1020  Mobility Specialist Time Calculation (min) (ACUTE ONLY) 14 min   Pt eager for mobility. Required MinG assistance during ambulation for safety. VSS throughout. Requested to use BSC at [facility]. Pt left on [facility] with all needs met, family present.   Lauraine Erm Mobility Specialist Please contact via SecureChat or [employer] [phone]

## 2024-04-04 NOTE — Progress Notes (Signed)
 Progress Note   Subjective  No bleeding symptoms. Tolerating full liquids. Family in room. No complaints.    Objective   Vital signs in last 24 hours: Temp:  [97 F (36.1 C)-99 F (37.2 C)] 97.6 F (36.4 C) (10/11 0706) Pulse Rate:  [59-67] 60 (10/11 0706) Resp:  [18-25] 20 (10/11 0706) BP: (127-170)/(54-65) 167/56 (10/11 0706) SpO2:  [92 %-98 %] 97 % (10/11 0706) Weight:  [55.2 kg] 55.2 kg (10/11 0500) Last BM Date : 04/02/24 General:    white male in NAD Neurologic:  Alert and oriented,  grossly normal neurologically. Psych:  Cooperative. Normal mood and affect.  Intake/Output from previous day: 10/10 0701 - 10/11 0700 In: 440 [P.O.:240; I.V.:200] Out: 1700 [Urine:1700] Intake/Output this shift: Total I/O In: 120 [P.O.:120] Out: 550 [Urine:550]  Lab Results: Recent Labs    04/02/24 0416 04/03/24 0915 04/04/24 0512  WBC 8.1 8.9 10.5  HGB 7.9* 10.5* 10.2*  HCT 23.5* 30.2* 29.6*  PLT 110* 112* 122*   BMET Recent Labs    04/02/24 2220 04/03/24 0447 04/04/24 0512  NA 137 138 137  K 3.9 4.7 4.3  CL 106 106 104  CO2 22 24 24   GLUCOSE 92 96 104*  BUN 16 13 16   CREATININE 1.28* 1.46* 1.46*  CALCIUM  7.7* 8.1* 7.9*   LFT Recent Labs    04/02/24 0416  PROT 3.9*  ALBUMIN 2.2*  AST 34  ALT 35  ALKPHOS 49  BILITOT 1.0   PT/INR No results for input(s): LABPROT, INR in the last 72 hours.  Studies/Results: No results found.     Assessment / Plan:    83 y/o male here with the following:  Upper GI bleed secondary to friable gastric polyp Post hemorrhagic anemia Chronic anticoagulation - Eliquis  for history of atrial fibrillation, CVA, PE   EGD on 10/6 showed a friable gastric polypoid lesion.  Biopsied and treated with Pura stat for temporizing measures.  He had a large burden of blood clot in the fundus that could not be cleared at the time of that exam and it was difficult to full evaluate this lesion, I had thought concerning for  malignancy.  Superficial biopsies showed a hyperplastic gastric polyp, no overt malignancy.  Repeat EGD yesterday was planned with EUS to assess depth of the lesion prior to attempted resection.  Fortunately clot burden had passed and got much better visualization of the lesion which was a large pedunculated polyp in the distal gastric body.  The head was multilobulated and initially appeared to be more of a sessile component but after confirming this was not the case I resected it, see procedure note for details.  He had some mild bleeding during the procedure and the polypectomy site was clipped.  No bleeding post procedure, hemoglobin stable.  He is tolerating for liquid diet.  I think we can advance his diet to soft today and discharge home later today if otherwise stable.  Please keep him on Protonix  40 mg twice daily for the next 4 weeks to help healing of the polypectomy site and reduce risk for post polypectomy bleeding.  I had a discussion with the patient and family today about when to resume his anticoagulation.  He is at risk for rebleeding over the next 2 weeks as the site heals however with interventions taken during the endoscopy hopefully that risk is low.  I think reasonable to resume Eliquis  on Monday, and he will see his primary care next week  for blood draw (he lives in [place] and preferred to do it there rather than coming to our office).  I should have the pathology results back for them next week, assuming this is going to be a large hyperplastic/benign polyp, but we will make sure no evidence of malignancy.  All questions answered, they agree with the plan, otherwise avoid all NSAIDs moving forward    PLAN: - soft diet today and then advance as tolerated tomorrow - okay to discharge home later today - continue protonix  40mg  BID PO for 4 weeks - resume Eliquis  on Monday - he will see PCP for blood draw next week to make sure stabl - continue iron tablets - await pathology  results.  Call with questions.  Marcey Naval, MD [facility]

## 2024-04-04 NOTE — Progress Notes (Signed)
 [facility]  PROGRESS NOTE  JAMALE SPANGLER FMW:[id number] DOB: 04-Oct-1940 DOA: 03/29/2024 PCP: Erick Greig LABOR, NP   Brief HPI:    59M h/o CAD s/p CABG, PAF on chronic anticoagulation, SSS s/p PPM, severe AS s/p TAVR in 2022, PE, CVA in setting of carotid artery stenosis s/p CEA, DM2, HTN, HLD, COPD, and CKD3 p/w hematemesis c/f UGIB       Assessment/Plan:   Hematemesis with UGI bleed and acute blood loss anemia -GI consulted -Pt noted to be anemic, requiring total 4 units PRBC's thus far this visit -Pt underwent EGD, results reviewed. Findings notable for a gastric lesion in the distal gastric body/prox antrum with old clot in stomach. Biopsy results are positive for gastric hyperplastic polyp, negative for dysplasia or metaplasia -Underwent repeat EGD which showed large pedunculated polyp in the distal gastric body.  Resected by gastroenterology.  Polypectomy site was clipped.  No bleeding postprocedure. -Continuing to hold anticoag for now -Okay to restart Eliquis  on Monday as per GI -Status post 1 unit PRBC, hemoglobin is up to 10.2 today. -Lasix  20 mg IV given after blood transfusion  UTI - Complain of dysuria today - UA obtained was abnormal with positive nitrate and more than 50 WBCs per high-power field - Started on Rocephin - Urine culture obtained, follow urine culture results   PAF -HOLD pta Eliquis  -Plan to restart Eliquis  on Monday   HFpEF -HOLD pta ASA, lasix , and losartan  per UGIB above -PTA amiodarone  200mg  daily  -PTA Crestor  -1 dose of Lasix  given as above  Hypokalemia - Potassium is 4.7    DM2 -PTA glimeperide 4mg  daily -SSI TID AC prn -Hold glimepiride  - CBG fairly well-controlled        Medications     sodium chloride    Intravenous Once   sodium chloride    Intravenous Once   amiodarone   200 mg Oral Daily   feeding supplement  237 mL Oral BID BM   insulin  aspart  0-15 Units Subcutaneous TID WC   losartan   25 mg Oral Daily   nystatin   5 mL Oral QID   pantoprazole   40 mg Oral BID   polyethylene glycol  17 g Oral BID   rosuvastatin   10 mg Oral Daily   senna-docusate  2 tablet Oral BID     Data Reviewed:   CBG:  Recent Labs  Lab 04/03/24 1221 04/03/24 1630 04/03/24 2122 04/04/24 0750 04/04/24 1150  GLUCAP 119* 309* 132* 116* 275*    SpO2: 97 % O2 Flow Rate (L/min): 4 L/min    Vitals:   04/04/24 0500 04/04/24 0508 04/04/24 0706 04/04/24 1119  BP:  (!) 164/56 (!) 167/56 (!) 156/62  Pulse: 67 64 60 60  Resp: 20  20 20   Temp: 99 F (37.2 C)  97.6 F (36.4 C) 98.4 F (36.9 C)  TempSrc: Oral  Oral Oral  SpO2: 97% 92% 97% 97%  Weight: 55.2 kg     Height:          Data Reviewed:  Basic Metabolic Panel: Recent Labs  Lab 04/01/24 0929 04/02/24 0416 04/02/24 2220 04/03/24 0447 04/04/24 0512  NA 138 139 137 138 137  K 3.7 3.2* 3.9 4.7 4.3  CL 109 110 106 106 104  CO2 20* 21* 22 24 24   GLUCOSE 221* 96 92 96 104*  BUN 33* 22 16 13 16   CREATININE 1.58* 1.36* 1.28* 1.46* 1.46*  CALCIUM  7.6* 7.6* 7.7* 8.1* 7.9*  MG  --   --  1.9  --   --     CBC: Recent Labs  Lab 03/31/24 0531 03/31/24 0900 04/01/24 0929 04/01/24 1439 04/02/24 0416 04/03/24 0915 04/04/24 0512  WBC 12.4*  --  7.7  --  8.1 8.9 10.5  HGB 8.9*   < > 7.9* 7.9* 7.9* 10.5* 10.2*  HCT 26.4*   < > 23.8* 23.4* 23.5* 30.2* 29.6*  MCV 92.0  --  92.6  --  92.9 90.7 91.9  PLT 104*  --  107*  --  110* 112* 122*   < > = values in this interval not displayed.    LFT Recent Labs  Lab 03/29/24 0402 03/31/24 0531 04/01/24 0929 04/02/24 0416  AST 19 19 46* 34  ALT 15 16 33 35  ALKPHOS 54 42 47 49  BILITOT 0.8 1.2 0.9 1.0  PROT 4.5* 4.0* 3.9* 3.9*  ALBUMIN 2.6* 2.2* 2.2* 2.2*     Antibiotics: Anti-infectives (From admission, onward)    Start     Dose/Rate Route Frequency Ordered Stop   04/04/24 1230  cefTRIAXone (ROCEPHIN) 1 g in sodium chloride  0.9 % 100 mL IVPB        1 g 200 mL/hr over 30 Minutes Intravenous Every 24  hours 04/04/24 1141     03/29/24 0700  piperacillin -tazobactam (ZOSYN ) IVPB 3.375 g        3.375 g 100 mL/hr over 30 Minutes Intravenous  Once 03/29/24 0655 03/29/24 0746        DVT prophylaxis: SCDs  Code Status: Full code  Family Communication: Discussed with patient's daughter at bedside   CONSULTS gastroenterology   Subjective   Patient seen and examined, complained of dysuria this morning.  UA was obtained was abnormal.  Urine culture obtained  Objective    Physical Examination:   General-appears in no acute distress Heart-S1-S2, regular, no murmur auscultated Lungs-clear to auscultation bilaterally, no wheezing or crackles auscultated Abdomen-soft, nontender, no organomegaly Extremities-no edema in the lower extremities Neuro-alert, oriented x3, no focal deficit noted  Status is: Inpatient:             [name]   [facility] If 7PM-7AM, please contact night-coverage at [email or web address], Office  [phone]   04/04/2024, 3:59 PM  LOS: 5 days

## 2024-04-05 ENCOUNTER — Encounter (HOSPITAL_COMMUNITY): Payer: Self-pay | Admitting: Gastroenterology

## 2024-04-05 DIAGNOSIS — D649 Anemia, unspecified: Secondary | ICD-10-CM | POA: Diagnosis not present

## 2024-04-05 DIAGNOSIS — K922 Gastrointestinal hemorrhage, unspecified: Secondary | ICD-10-CM | POA: Diagnosis not present

## 2024-04-05 DIAGNOSIS — K920 Hematemesis: Secondary | ICD-10-CM | POA: Diagnosis not present

## 2024-04-05 DIAGNOSIS — K317 Polyp of stomach and duodenum: Secondary | ICD-10-CM | POA: Diagnosis not present

## 2024-04-05 LAB — GLUCOSE, CAPILLARY
Glucose-Capillary: 130 mg/dL — ABNORMAL HIGH (ref 70–99)
Glucose-Capillary: 174 mg/dL — ABNORMAL HIGH (ref 70–99)
Glucose-Capillary: 212 mg/dL — ABNORMAL HIGH (ref 70–99)
Glucose-Capillary: 100 mg/dL — ABNORMAL HIGH (ref 70–99)

## 2024-04-05 NOTE — Progress Notes (Signed)
 Mobility Specialist Progress Note;   04/05/24 0953  Mobility  Activity Ambulated with assistance;Pivoted/transferred from bed to chair  Level of Assistance Contact guard assist, steadying assist  Assistive Device Front wheel walker  Distance Ambulated (ft) 400 ft  Activity Response Tolerated well  Mobility Referral Yes  Mobility visit 1 Mobility  Mobility Specialist Start Time (ACUTE ONLY) [id number]  Mobility Specialist Stop Time (ACUTE ONLY) 1010  Mobility Specialist Time Calculation (min) (ACUTE ONLY) 17 min   Pt eager for mobility. Required MinG assistance during ambulation. VSS throughout and no c/o this AM. Requested to sit in chair at [facility]. Pt left in chair with all needs met, call bell in reach. Alarm on.   Lauraine Erm Mobility Specialist Please contact via SecureChat or [employer] [phone]

## 2024-04-05 NOTE — Plan of Care (Signed)
  Problem: Clinical Measurements: Goal: Respiratory complications will improve Outcome: Progressing Goal: Cardiovascular complication will be avoided Outcome: Progressing   Problem: Activity: Goal: Risk for activity intolerance will decrease Outcome: Progressing   Problem: Nutrition: Goal: Adequate nutrition will be maintained Outcome: Progressing   Problem: Safety: Goal: Ability to remain free from injury will improve Outcome: Progressing   

## 2024-04-05 NOTE — Progress Notes (Signed)
 [facility]  PROGRESS NOTE  Bryan Carney FMW:[id number] DOB: 1940/09/02 DOA: 03/29/2024 PCP: Erick Greig LABOR, NP   Brief HPI:    53M h/o CAD s/p CABG, PAF on chronic anticoagulation, SSS s/p PPM, severe AS s/p TAVR in 2022, PE, CVA in setting of carotid artery stenosis s/p CEA, DM2, HTN, HLD, COPD, and CKD3 p/w hematemesis c/f UGIB       Assessment/Plan:   Hematemesis with UGI bleed and acute blood loss anemia -GI consulted -Pt noted to be anemic, requiring total 4 units PRBC's thus far this visit -Pt underwent EGD, results reviewed. Findings notable for a gastric lesion in the distal gastric body/prox antrum with old clot in stomach. Biopsy results are positive for gastric hyperplastic polyp, negative for dysplasia or metaplasia -Underwent repeat EGD which showed large pedunculated polyp in the distal gastric body.  Resected by gastroenterology.  Polypectomy site was clipped.  No bleeding postprocedure. -Continuing to hold anticoag for now -Okay to restart Eliquis  on Monday as per GI -Status post 1 unit PRBC, hemoglobin is up to 10.2 today. -Lasix  20 mg IV given after blood transfusion - Recommendations per GI  soft diet today and then advance as tolerated tomorrow - okay to discharge home later today - continue protonix  40mg  BID PO for 4 weeks - resume Eliquis  on Monday - he will see PCP for blood draw next week to make sure stabl - continue iron tablets - await pathology results.  UTI - Complained of dysuria on 10/11 - UA obtained was abnormal with positive nitrate and more than 50 WBCs per high-power field - Started on Rocephin - Urine culture obtained, follow urine culture results   PAF -HOLD pta Eliquis  -Plan to restart Eliquis  on Monday   HFpEF -HOLD pta ASA, lasix , and losartan  per UGIB above -PTA amiodarone  200mg  daily  -PTA Crestor  -1 dose of Lasix  given as above  Hypokalemia - Potassium is 4.7    DM2 -PTA glimeperide 4mg  daily -SSI TID AC prn -Hold  glimepiride  - CBG fairly well-controlled        Medications     sodium chloride    Intravenous Once   sodium chloride    Intravenous Once   amiodarone   200 mg Oral Daily   feeding supplement  237 mL Oral BID BM   insulin  aspart  0-15 Units Subcutaneous TID WC   losartan   25 mg Oral Daily   nystatin  5 mL Oral QID   pantoprazole   40 mg Oral BID   polyethylene glycol  17 g Oral BID   rosuvastatin   10 mg Oral Daily   senna-docusate  2 tablet Oral BID     Data Reviewed:   CBG:  Recent Labs  Lab 04/04/24 0750 04/04/24 1150 04/04/24 1626 04/04/24 2058 04/05/24 0741  GLUCAP 116* 275* 168* 156* 130*    SpO2: 90 % O2 Flow Rate (L/min): 4 L/min    Vitals:   04/04/24 1930 04/04/24 2020 04/04/24 2329 04/05/24 0452  BP: (!) 118/46  (!) 154/73 (!) 144/55  Pulse: 60 (!) 59 76 60  Resp: 18  18 18   Temp: 98.8 F (37.1 C)  97.7 F (36.5 C) 97.7 F (36.5 C)  TempSrc: Oral  Oral Oral  SpO2: 93% 97% 90% 90%  Weight:    62.5 kg  Height:          Data Reviewed:  Basic Metabolic Panel: Recent Labs  Lab 04/01/24 0929 04/02/24 0416 04/02/24 2220 04/03/24 0447 04/04/24 0512  NA 138 139  137 138 137  K 3.7 3.2* 3.9 4.7 4.3  CL 109 110 106 106 104  CO2 20* 21* 22 24 24   GLUCOSE 221* 96 92 96 104*  BUN 33* 22 16 13 16   CREATININE 1.58* 1.36* 1.28* 1.46* 1.46*  CALCIUM  7.6* 7.6* 7.7* 8.1* 7.9*  MG  --   --  1.9  --   --     CBC: Recent Labs  Lab 03/31/24 0531 03/31/24 0900 04/01/24 0929 04/01/24 1439 04/02/24 0416 04/03/24 0915 04/04/24 0512  WBC 12.4*  --  7.7  --  8.1 8.9 10.5  HGB 8.9*   < > 7.9* 7.9* 7.9* 10.5* 10.2*  HCT 26.4*   < > 23.8* 23.4* 23.5* 30.2* 29.6*  MCV 92.0  --  92.6  --  92.9 90.7 91.9  PLT 104*  --  107*  --  110* 112* 122*   < > = values in this interval not displayed.    LFT Recent Labs  Lab 03/31/24 0531 04/01/24 0929 04/02/24 0416  AST 19 46* 34  ALT 16 33 35  ALKPHOS 42 47 49  BILITOT 1.2 0.9 1.0  PROT 4.0* 3.9* 3.9*   ALBUMIN 2.2* 2.2* 2.2*     Antibiotics: Anti-infectives (From admission, onward)    Start     Dose/Rate Route Frequency Ordered Stop   04/04/24 1230  cefTRIAXone (ROCEPHIN) 1 g in sodium chloride  0.9 % 100 mL IVPB        1 g 200 mL/hr over 30 Minutes Intravenous Every 24 hours 04/04/24 1141     03/29/24 0700  piperacillin -tazobactam (ZOSYN ) IVPB 3.375 g        3.375 g 100 mL/hr over 30 Minutes Intravenous  Once 03/29/24 0655 03/29/24 0746        DVT prophylaxis: SCDs  Code Status: Full code  Family Communication: Discussed with patient's daughter at bedside   CONSULTS gastroenterology   Subjective   Patient seen and examined, denies any complaints.  Objective    Physical Examination:   General-appears in no acute distress Heart-S1-S2, regular, no murmur auscultated Lungs-clear to auscultation bilaterally, no wheezing or crackles auscultated Abdomen-soft, nontender, no organomegaly Extremities-no edema in the lower extremities Neuro-alert, oriented x3, no focal deficit noted  Status is: Inpatient:             Bryan Carney   [facility] If 7PM-7AM, please contact night-coverage at [email or web address], Office  [phone]   04/05/2024, 8:02 AM  LOS: 6 days

## 2024-04-06 DIAGNOSIS — K922 Gastrointestinal hemorrhage, unspecified: Secondary | ICD-10-CM | POA: Diagnosis not present

## 2024-04-06 DIAGNOSIS — D649 Anemia, unspecified: Secondary | ICD-10-CM | POA: Diagnosis not present

## 2024-04-06 DIAGNOSIS — K920 Hematemesis: Secondary | ICD-10-CM | POA: Diagnosis not present

## 2024-04-06 DIAGNOSIS — K317 Polyp of stomach and duodenum: Secondary | ICD-10-CM | POA: Diagnosis not present

## 2024-04-06 LAB — GLUCOSE, CAPILLARY
Glucose-Capillary: 84 mg/dL (ref 70–99)
Glucose-Capillary: 224 mg/dL — ABNORMAL HIGH (ref 70–99)
Glucose-Capillary: 181 mg/dL — ABNORMAL HIGH (ref 70–99)
Glucose-Capillary: 96 mg/dL (ref 70–99)

## 2024-04-06 LAB — SURGICAL PATHOLOGY

## 2024-04-06 MED ORDER — APIXABAN 2.5 MG PO TABS: 2.5000 mg | ORAL_TABLET | Freq: Two times a day (BID) | ORAL | Status: DC

## 2024-04-06 MED ORDER — APIXABAN 2.5 MG PO TABS
2.5000 mg | ORAL_TABLET | Freq: Two times a day (BID) | ORAL | Status: DC
  Administered 2024-04-06 – 2024-04-07 (×2): 2.5 mg via ORAL
  Filled 2024-04-06 (×2): qty 1

## 2024-04-06 NOTE — Care Management Important Message (Signed)
 Important Message  Patient Details  Name: Bryan Carney MRN: [id number] Date of Birth: 05-25-1941   Important Message Given:  Yes - Medicare IM     [name] Sharps 04/06/2024, 11:25 AM

## 2024-04-06 NOTE — Plan of Care (Signed)
   Problem: Education: Goal: Ability to describe self-care measures that may prevent or decrease complications (Diabetes Survival Skills Education) will improve Outcome: Progressing Goal: Individualized Educational Video(s) Outcome: Progressing   Problem: Coping: Goal: Ability to adjust to condition or change in health will improve Outcome: Progressing

## 2024-04-06 NOTE — Plan of Care (Signed)
?  Problem: Clinical Measurements: ?Goal: Respiratory complications will improve ?Outcome: Progressing ?Goal: Cardiovascular complication will be avoided ?Outcome: Progressing ?  ?Problem: Activity: ?Goal: Risk for activity intolerance will decrease ?Outcome: Progressing ?  ?Problem: Nutrition: ?Goal: Adequate nutrition will be maintained ?Outcome: Progressing ?  ?Problem: Coping: ?Goal: Level of anxiety will decrease ?Outcome: Progressing ?  ?Problem: Safety: ?Goal: Ability to remain free from injury will improve ?Outcome: Progressing ?  ?

## 2024-04-06 NOTE — Progress Notes (Signed)
 [facility]  PROGRESS NOTE  CLYDELL SPOSITO FMW:[id number] DOB: [birth year] DOA: 03/29/2024 PCP: Erick Greig LABOR, NP   Brief HPI:    41M h/o CAD s/p CABG, PAF on chronic anticoagulation, SSS s/p PPM, severe AS s/p TAVR in 2022, PE, CVA in setting of carotid artery stenosis s/p CEA, DM2, HTN, HLD, COPD, and CKD3 p/w hematemesis c/f UGIB       Assessment/Plan:   Hematemesis with UGI bleed and acute blood loss anemia -GI consulted -Pt noted to be anemic, requiring total 4 units PRBC's thus far this visit -Pt underwent EGD, results reviewed. Findings notable for a gastric lesion in the distal gastric body/prox antrum with old clot in stomach. Biopsy results are positive for gastric hyperplastic polyp, negative for dysplasia or metaplasia -Underwent repeat EGD which showed large pedunculated polyp in the distal gastric body.  Resected by gastroenterology.  Polypectomy site was clipped.  No bleeding postprocedure. -Continuing to hold anticoag for now -Okay to restart Eliquis  on Monday as per GI -Status post 1 unit PRBC, hemoglobin is up to 10.2 today. -Lasix  20 mg IV given after blood transfusion - Recommendations per GI  soft diet today and then advance as tolerated tomorrow - okay to discharge home later today - continue protonix  40mg  BID PO for 4 weeks - resume Eliquis  on Monday - he will see PCP for blood draw next week to make sure stabl - continue iron tablets - await pathology results.  UTI - Complained of dysuria on 10/11 - UA obtained was abnormal with positive nitrate and more than 50 WBCs per high-power field - Started on Rocephin - Urine culture obtained,  - Urine culture grew E. coli and Klebsiella pneumoniae.  Final sensitivities pending   PAF -HOLD pta Eliquis  -Plan to restart Eliquis  on Monday -Will restart Eliquis  from tonight   HFpEF -HOLD pta ASA, lasix , and losartan  per UGIB above -PTA amiodarone  200mg  daily  -PTA Crestor  -1 dose of Lasix  given as  above  Hypokalemia - Potassium is 4.7    DM2 -PTA glimeperide 4mg  daily -SSI TID AC prn -Hold glimepiride  - CBG fairly well-controlled        Medications     sodium chloride    Intravenous Once   sodium chloride    Intravenous Once   amiodarone   200 mg Oral Daily   feeding supplement  237 mL Oral BID BM   insulin  aspart  0-15 Units Subcutaneous TID WC   losartan   25 mg Oral Daily   nystatin  5 mL Oral QID   pantoprazole   40 mg Oral BID   polyethylene glycol  17 g Oral BID   rosuvastatin   10 mg Oral Daily   senna-docusate  2 tablet Oral BID     Data Reviewed:   CBG:  Recent Labs  Lab 04/05/24 0741 04/05/24 1212 04/05/24 1819 04/05/24 2034 04/06/24 0714  GLUCAP 130* 174* 212* 100* 84    SpO2: 95 % O2 Flow Rate (L/min): 4 L/min    Vitals:   04/05/24 2011 04/05/24 2300 04/06/24 0444 04/06/24 0721  BP: 135/72 (!) 129/52 (!) 120/56 134/71  Pulse: 63 61 60 61  Resp: 18 18 18 18   Temp: 98 F (36.7 C)  98.5 F (36.9 C) 98 F (36.7 C)  TempSrc: Oral  Oral Oral  SpO2: 96% 91% 92% 95%  Weight:   63.2 kg   Height:          Data Reviewed:  Basic Metabolic Panel: Recent Labs  Lab 04/01/24  9070 04/02/24 0416 04/02/24 2220 04/03/24 0447 04/04/24 0512  NA 138 139 137 138 137  K 3.7 3.2* 3.9 4.7 4.3  CL 109 110 106 106 104  CO2 20* 21* 22 24 24   GLUCOSE 221* 96 92 96 104*  BUN 33* 22 16 13 16   CREATININE 1.58* 1.36* 1.28* 1.46* 1.46*  CALCIUM  7.6* 7.6* 7.7* 8.1* 7.9*  MG  --   --  1.9  --   --     CBC: Recent Labs  Lab 03/31/24 0531 03/31/24 0900 04/01/24 0929 04/01/24 1439 04/02/24 0416 04/03/24 0915 04/04/24 0512  WBC 12.4*  --  7.7  --  8.1 8.9 10.5  HGB 8.9*   < > 7.9* 7.9* 7.9* 10.5* 10.2*  HCT 26.4*   < > 23.8* 23.4* 23.5* 30.2* 29.6*  MCV 92.0  --  92.6  --  92.9 90.7 91.9  PLT 104*  --  107*  --  110* 112* 122*   < > = values in this interval not displayed.    LFT Recent Labs  Lab 03/31/24 0531 04/01/24 0929  04/02/24 0416  AST 19 46* 34  ALT 16 33 35  ALKPHOS 42 47 49  BILITOT 1.2 0.9 1.0  PROT 4.0* 3.9* 3.9*  ALBUMIN 2.2* 2.2* 2.2*     Antibiotics: Anti-infectives (From admission, onward)    Start     Dose/Rate Route Frequency Ordered Stop   04/04/24 1230  cefTRIAXone (ROCEPHIN) 1 g in sodium chloride  0.9 % 100 mL IVPB        1 g 200 mL/hr over 30 Minutes Intravenous Every 24 hours 04/04/24 1141     03/29/24 0700  piperacillin -tazobactam (ZOSYN ) IVPB 3.375 g        3.375 g 100 mL/hr over 30 Minutes Intravenous  Once 03/29/24 0655 03/29/24 0746        DVT prophylaxis: SCDs  Code Status: Full code  Family Communication: Discussed with patient's daughter at bedside   CONSULTS gastroenterology   Subjective   Patient seen and examined, no new complaints.  Objective    Physical Examination:   General-appears in no acute distress Heart-S1-S2, regular, no murmur auscultated Lungs-clear to auscultation bilaterally, no wheezing or crackles auscultated Abdomen-soft, nontender, no organomegaly Extremities-no edema in the lower extremities Neuro-alert, oriented x3, no focal deficit noted  Status is: Inpatient:             [name]   [facility] If 7PM-7AM, please contact night-coverage at [email or web address], Office  [phone]   04/06/2024, 7:44 AM  LOS: 7 days

## 2024-04-07 ENCOUNTER — Other Ambulatory Visit (HOSPITAL_COMMUNITY): Payer: Self-pay

## 2024-04-07 DIAGNOSIS — K922 Gastrointestinal hemorrhage, unspecified: Secondary | ICD-10-CM | POA: Diagnosis not present

## 2024-04-07 DIAGNOSIS — D649 Anemia, unspecified: Secondary | ICD-10-CM | POA: Diagnosis not present

## 2024-04-07 DIAGNOSIS — K317 Polyp of stomach and duodenum: Secondary | ICD-10-CM | POA: Diagnosis not present

## 2024-04-07 DIAGNOSIS — K920 Hematemesis: Secondary | ICD-10-CM | POA: Diagnosis not present

## 2024-04-07 LAB — CBC
HCT: 30 % — ABNORMAL LOW (ref 39.0–52.0)
Hemoglobin: 9.8 g/dL — ABNORMAL LOW (ref 13.0–17.0)
MCH: 30.9 pg (ref 26.0–34.0)
MCHC: 32.7 g/dL (ref 30.0–36.0)
MCV: 94.6 fL (ref 80.0–100.0)
Platelets: 159 K/uL (ref 150–400)
RBC: 3.17 MIL/uL — ABNORMAL LOW (ref 4.22–5.81)
RDW: 16.5 % — ABNORMAL HIGH (ref 11.5–15.5)
WBC: 6.2 K/uL (ref 4.0–10.5)
nRBC: 0 % (ref 0.0–0.2)

## 2024-04-07 LAB — GLUCOSE, CAPILLARY
Glucose-Capillary: 130 mg/dL — ABNORMAL HIGH (ref 70–99)
Glucose-Capillary: 175 mg/dL — ABNORMAL HIGH (ref 70–99)

## 2024-04-07 LAB — URINE CULTURE: Culture: >=100000 — AB

## 2024-04-07 MED ORDER — NYSTATIN 100000 UNIT/ML MT SUSP
5.0000 mL | Freq: Four times a day (QID) | OROMUCOSAL | 0 refills | Status: AC
  Filled 2024-04-07: qty 100, 5d supply, fill #0

## 2024-04-07 MED ORDER — PANTOPRAZOLE SODIUM 40 MG PO TBEC
40.0000 mg | DELAYED_RELEASE_TABLET | Freq: Two times a day (BID) | ORAL | 0 refills | Status: AC
  Filled 2024-04-07: qty 56, 28d supply, fill #0

## 2024-04-07 MED ORDER — BENZONATATE 100 MG PO CAPS
100.0000 mg | ORAL_CAPSULE | Freq: Three times a day (TID) | ORAL | 0 refills | Status: AC | PRN
  Filled 2024-04-07: qty 20, 7d supply, fill #0

## 2024-04-07 MED ORDER — CEPHALEXIN 500 MG PO CAPS
500.0000 mg | ORAL_CAPSULE | Freq: Two times a day (BID) | ORAL | 0 refills | Status: AC
  Filled 2024-04-07: qty 6, 3d supply, fill #0

## 2024-04-07 NOTE — Progress Notes (Signed)
 Mobility Specialist Progress Note;   04/07/24 0851  Mobility  Activity Ambulated with assistance;Pivoted/transferred from bed to chair  Level of Assistance Contact guard assist, steadying assist  Assistive Device Front wheel walker;None  Distance Ambulated (ft) 400 ft  Activity Response Tolerated well  Mobility Referral Yes  Mobility visit 1 Mobility  Mobility Specialist Start Time (ACUTE ONLY) [id number]  Mobility Specialist Stop Time (ACUTE ONLY) 0903  Mobility Specialist Time Calculation (min) (ACUTE ONLY) 12 min   Pt eager for mobility. Required MinG assistance during ambulation for safety. Pt wanted to trial ambulating w/o RW, was able to safely do so and increase speed. VSS throughout. Requested to sit in chair at [facility]. Pt left in chair with all needs met, alarm on. RN present.   Lauraine Erm Mobility Specialist Please contact via SecureChat or [employer] [phone]

## 2024-04-07 NOTE — TOC Transition Note (Signed)
 Transition of Care [facility]) - Discharge Note   Patient Details  Name: NURI LARMER MRN: [id number] Date of Birth: [birth year]  [facility] CM/SW Contact:  Sudie Erminio Deems, RN Phone Number: 04/07/2024, 1:08 PM   Clinical Narrative:  Patient will transition home today. No home needs identified at this time. Patient has transportation home.   Final next level of care: Home/Self Care Barriers to Discharge: No Barriers Identified  Patient Goals and CMS Choice Patient states their goals for this hospitalization and ongoing recovery are:: Plans to return home with family support   Choice offered to / list presented to : NA   Discharge Plan and Services Additional resources added to the After Visit Summary for   In-house Referral: NA Discharge Planning Services: CM Consult Post Acute Care Choice: NA            DME Agency: NA       HH Arranged: NA   Social Drivers of Health (SDOH) Interventions SDOH Screenings   Food Insecurity: No Food Insecurity (03/29/2024)  Housing: Low Risk  (03/29/2024)  Transportation Needs: No Transportation Needs (03/29/2024)  Utilities: Not At Risk (03/29/2024)  Social Connections: Moderately Integrated (03/29/2024)  Tobacco Use: Low Risk  (04/03/2024)   Readmission Risk Interventions     No data to display

## 2024-04-07 NOTE — Discharge Summary (Signed)
 Physician Discharge Summary   Patient: Bryan Carney MRN: [id number] DOB: October 18, 1940  Admit date:     03/29/2024  Discharge date: 04/07/24  Discharge Physician: [name]   PCP: Erick Greig LABOR, NP   Recommendations at discharge:   Follow-up PCP as outpatient Follow-up gastroenterology as outpatient  Discharge Diagnoses: Principal Problem:   Acute upper GI bleed Active Problems:   Anticoagulated   Hematemesis with nausea   Anemia   Gastric polyp  Resolved Problems:   * No resolved hospital problems. [facility] Course: 67M h/o CAD s/p CABG, PAF on chronic anticoagulation, SSS s/p PPM, severe AS s/p TAVR in 2022, PE, CVA in setting of carotid artery stenosis s/p CEA, DM2, HTN, HLD, COPD, and CKD3 p/w hematemesis c/f UGIB   Assessment and Plan:  Hematemesis with UGI bleed and acute blood loss anemia  -Pt noted to be anemic, requiring total 4 units PRBC's thus far this visit -Pt underwent EGD, results reviewed. Findings notable for a gastric lesion in the distal gastric body/prox antrum with old clot in stomach. Biopsy results are positive for gastric hyperplastic polyp, negative for dysplasia or metaplasia -Underwent repeat EGD which showed large pedunculated polyp in the distal gastric body.  Resected by gastroenterology.  Polypectomy site was clipped.  No bleeding postprocedure. -Okay to restart Eliquis  on Monday as per GI -Status post 1 unit PRBC, hemoglobin is up to 10.2 today. -Lasix  20 mg IV given after blood transfusion - Recommendations per GI  soft diet today and then advance as tolerated tomorrow - continue protonix  40mg  BID PO for 4 weeks - Resume Eliquis  - he will see PCP for blood draw next week to make sure stable - continue iron tablets - await pathology results.   UTI - Complained of dysuria on 10/11 - UA obtained was abnormal with positive nitrate and more than 50 WBCs per high-power field - Started on Rocephin on 04/04/2024  - Urine culture obtained,   - Urine culture grew E. coli and Klebsiella pneumoniae.   - Will discharge home on Keflex 500 mg p.o. twice daily for 3 days     PAF - Continue Eliquis  -Continue amiodarone    HFpEF - Hold aspirin  -Continue Lasix    Hypokalemia - Potassium is 4.7     DM2 - Continue home medications       Consultants: Gastroenterology Procedures performed: EGD Disposition: Home Diet recommendation:  Discharge Diet Orders (From admission, onward)     Start     Ordered   04/07/24 0000  Diet - low sodium heart healthy        04/07/24 1148           Regular diet DISCHARGE MEDICATION: Allergies as of 04/07/2024       Reactions   Lopressor  [metoprolol  Tartrate] Other (See Comments)   Severe BP drop   Lipitor [atorvastatin ] Other (See Comments)   Muscle aches        Medication List     STOP taking these medications    aspirin  EC 81 MG tablet       TAKE these medications    amiodarone  200 MG tablet Commonly known as: PACERONE  TAKE ONE TABLET BY MOUTH DAILY. Please make overdue appt with Dr. Inocencio before anymore refills. Thank you 3rd and Final attempt   apixaban  2.5 MG Tabs tablet Commonly known as: ELIQUIS  Take 1 tablet (2.5 mg total) by mouth 2 (two) times daily.   benzonatate 100 MG capsule Commonly known as: TESSALON Take 1  capsule (100 mg total) by mouth 3 (three) times daily as needed for cough.   BLACK ELDERBERRY PO Take 15 mLs by mouth daily.   cephALEXin 500 MG capsule Commonly known as: KEFLEX Take 1 capsule (500 mg total) by mouth 2 (two) times daily for 3 days. Start taking on: April 08, 2024   cetirizine 10 MG tablet Commonly known as: ZYRTEC Take 10 mg by mouth daily.   ferrous sulfate  325 (65 FE) MG tablet Take 325 mg by mouth in the morning and at bedtime.   furosemide  20 MG tablet Commonly known as: LASIX  Take 1 tablet (20 mg total) by mouth daily.   glimepiride  4 MG tablet Commonly known as: AMARYL  Take 1 tablet (4 mg total)  by mouth daily with breakfast.   linagliptin  5 MG Tabs tablet Commonly known as: TRADJENTA  Take 1 tablet (5 mg total) by mouth daily.   losartan  25 MG tablet Commonly known as: COZAAR  Take 1 tablet (25 mg total) by mouth daily.   nystatin 100000 UNIT/ML suspension Commonly known as: MYCOSTATIN Take 5 mLs (500,000 Units total) by mouth 4 (four) times daily for 5 days.   pantoprazole  40 MG tablet Commonly known as: PROTONIX  Take 1 tablet (40 mg total) by mouth 2 (two) times daily for 28 days.   rosuvastatin  10 MG tablet Commonly known as: CRESTOR  Take 1 tablet (10 mg total) by mouth daily. What changed: when to take this        Discharge Exam: Filed Weights   04/05/24 0452 04/06/24 0444 04/07/24 0334  Weight: 62.5 kg 63.2 kg 63.4 kg   General-appears in no acute distress Heart-S1-S2, regular, no murmur auscultated Lungs-clear to auscultation bilaterally, no wheezing or crackles auscultated Abdomen-soft, nontender, no organomegaly Extremities-no edema in the lower extremities Neuro-alert, oriented x3, no focal deficit noted  Condition at discharge: good  The results of significant diagnostics from this hospitalization (including imaging, microbiology, ancillary and laboratory) are listed below for reference.   Imaging Studies: CT ANGIO GI BLEED Result Date: 03/29/2024 CLINICAL DATA:  GI bleed study, check for hemorrhagic source. EXAM: CTA ABDOMEN AND PELVIS WITHOUT AND WITH CONTRAST TECHNIQUE: Multidetector CT imaging of the abdomen and pelvis was performed using the standard protocol during bolus administration of intravenous contrast. Multiplanar reconstructed images and MIPs were obtained and reviewed to evaluate the vascular anatomy. RADIATION DOSE REDUCTION: This exam was performed according to the departmental dose-optimization program which includes automated exposure control, adjustment of the mA and/or kV according to patient size and/or use of iterative  reconstruction technique. CONTRAST:  90mL OMNIPAQUE  IOHEXOL  350 MG/ML SOLN COMPARISON:  CTA chest, abdomen and pelvis 06/10/2020. FINDINGS: VASCULAR Aorta: Within normal caliber limits but heavily calcified. No dissection. Celiac: There are ostial calcific plaques with a 40% origin stenosis. The vessel is otherwise widely patent. There are patchy calcifications in the splenic arterial branch artery with moderate to severe irregular stenosis. SMA: There is moderate mixed plaque in the vessel proximal to the inflection point, with up to 50% luminal stenosis. Distal to the inflection point, there is no flow-limiting stenosis. There is a replaced, widely patent right hepatic artery arising from it. Renals: Chronically occluded right renal artery with end-stage atrophy. Only minimal right renal enhancement seen and is not seen until the portal venous phase images. Left renal artery is also single. Circumferential calcific plaques in the proximal 1 cm cause 60-70% stenosis. The vessel is otherwise widely patent. IMA: High-grade calcific origin stenosis. The vessel otherwise opacifies well possible through  backfill. Inflow: There are patchy vessel wall calcifications greatest in the common iliac and internal iliac arteries, but no flow-limiting inflow vessel stenosis bilaterally. Proximal Outflow: Bilateral common femoral and visualized portions of the superficial and profunda femoral arteries are patent without evidence of aneurysm, dissection, vasculitis or significant stenosis. There are calcific plaques in the common femoral arteries but they are nonstenosing. Veins: Patent. Review of the MIP images confirms the above findings. NON-VASCULAR Lower chest: There is posterior atelectasis in the lung bases without infiltrates Mild elevation right hemidiaphragm. The cardiac blood pool is less dense than the myocardium on the noncontrast imaging consistent with anemia. There are old sternotomy and CABG changes, heavy native  CAD, pacemaker wiring in the right heart. Interval TAVR. There is mild cardiomegaly with left chamber predominance and no pericardial effusion. Hepatobiliary: No focal liver abnormality is seen. No gallstones, gallbladder wall thickening, or biliary dilatation. Pancreas: Moderately atrophic.  No mass or ductal dilatation Spleen: No abnormality. Adrenals/Urinary Tract: Severe chronic vascular areolar atrophy. No adrenal mass. Scattered subcentimeter Bosniak 2 right renal cysts are too small to characterize but do not require follow-up. There is chronic asymmetric right perinephric stranding and trace fluid. On the left, there has an interpolar Bosniak 1 parapelvic cyst measuring 3.2 cm, 18 Hounsfield units. No follow-up imaging is recommended. There is no renal mass enhancement. No urinary stone or obstruction. There is mild bladder thickening versus underdistention. Correlate with urinalysis for possible significance. Stomach/Bowel: The stomach is moderately distended with fluid and either a large amount of food products or bezoar. Some concern for early pneumatosis in the lower fundal wall, although there is no portal venous gas. Close clinical follow-up recommended. No active GI bleed is seen. The small bowel is normal caliber. No wall thickening or dilatation in the colon. There is a normal caliber appendix. The colon wall unremarkable until the distal descending and sigmoid segments with diverticulosis in these segments most advanced in the sigmoid. There is mucosal thickening and enhancement in the sigmoid segment, and faint stranding noted along the proximal sigmoid in keeping with a mild acute diverticulitis. There is no diverticular or intramural abscess and no free air. Lymphatic: No adenopathy. Reproductive: Enlarged prostate, 5 cm transverse, unchanged. Other: No free hemorrhage, free fluid or free air. No abdominal wall hernia. Musculoskeletal: Osteopenia and degenerative change lumbar spine, most  advanced at L4-5 were there has been decompression laminectomy and there is chronic grade 1 anterolisthesis. No acute or significant osseous findings. IMPRESSION: 1. No active GI bleed is seen. 2. Aortic and branch vessel atherosclerosis. 3. 40% origin stenosis of the celiac artery, 50% stenosis of the SMA proximal to the inflection point, and high-grade calcific origin stenosis of the IMA. 4. Chronically occluded right renal artery with end-stage right renal atrophy. 5. 60-70% calcific stenosis of the proximal left renal artery. 6. No flow-limiting inflow or proximal outflow vessel stenosis. 7. Distended stomach with either a large amount of food products or bezoar. Some concern for early pneumatosis in the lower fundal wall, although there is no portal venous gas. Close clinical follow-up recommended. 8. Mild acute diverticulitis in the proximal sigmoid segment. No diverticular or intramural abscess and no free air. 9. Cystitis versus bladder nondistention or hypertrophy. 10. Prostatomegaly. 11. Cardiomegaly with CABG changes and TAVR. 12. Anemia. Aortic Atherosclerosis (ICD10-I70.0). Electronically Signed   By: Francis Quam M.D.   On: 03/29/2024 06:40   CT Head Wo Contrast Result Date: 03/29/2024 CLINICAL DATA:  CT HEAD WO CONTRASTHead trauma, minor (  Age >= 65y)CT CERVICAL SPINE WO CONTRASTNeck trauma (Age >= 65y) EXAM: CT HEAD WITHOUT CONTRAST CT CERVICAL SPINE WITHOUT CONTRAST TECHNIQUE: Multidetector CT imaging of the head and cervical spine was performed following the standard protocol without intravenous contrast. Multiplanar CT image reconstructions of the cervical spine were also generated. RADIATION DOSE REDUCTION: This exam was performed according to the departmental dose-optimization program which includes automated exposure control, adjustment of the mA and/or kV according to patient size and/or use of iterative reconstruction technique. COMPARISON:  Head CT 03/25/2017 FINDINGS: CT HEAD FINDINGS  Brain: There is no evidence for acute hemorrhage, hydrocephalus, mass lesion, or abnormal extra-axial fluid collection. No definite CT evidence for acute infarction. Diffuse loss of parenchymal volume is consistent with atrophy. Patchy low attenuation in the deep hemispheric and periventricular white matter is nonspecific, but likely reflects chronic microvascular ischemic demyelination. Vascular: No hyperdense vessel or unexpected calcification. Skull: No evidence for fracture. No worrisome lytic or sclerotic lesion. Sinuses/Orbits: Mild chronic mucosal thickening noted right sphenoid sinus. Similar chronic changes left maxillary sinus. No mastoid effusion. Visualized portions of the globes and intraorbital fat are unremarkable. Other: None. CT CERVICAL SPINE FINDINGS Alignment: Normal. Skull base and vertebrae: No acute fracture. No primary bone lesion or focal pathologic process. Soft tissues and spinal canal: No prevertebral fluid or swelling. No visible canal hematoma. Disc levels: Loss of disc height with endplate degeneration noted C4-5, C5-6, and C6-7. Upper chest: No acute findings. Other: None IMPRESSION: 1. No acute intracranial abnormality. 2. Atrophy with chronic small vessel ischemic disease. 3. Degenerative changes in the cervical spine without fracture. Electronically Signed   By: Camellia Candle M.D.   On: 03/29/2024 06:08   CT Cervical Spine Wo Contrast Result Date: 03/29/2024 CLINICAL DATA:  CT HEAD WO CONTRASTHead trauma, minor (Age >= 65y)CT CERVICAL SPINE WO CONTRASTNeck trauma (Age >= 65y) EXAM: CT HEAD WITHOUT CONTRAST CT CERVICAL SPINE WITHOUT CONTRAST TECHNIQUE: Multidetector CT imaging of the head and cervical spine was performed following the standard protocol without intravenous contrast. Multiplanar CT image reconstructions of the cervical spine were also generated. RADIATION DOSE REDUCTION: This exam was performed according to the departmental dose-optimization program which includes  automated exposure control, adjustment of the mA and/or kV according to patient size and/or use of iterative reconstruction technique. COMPARISON:  Head CT 03/25/2017 FINDINGS: CT HEAD FINDINGS Brain: There is no evidence for acute hemorrhage, hydrocephalus, mass lesion, or abnormal extra-axial fluid collection. No definite CT evidence for acute infarction. Diffuse loss of parenchymal volume is consistent with atrophy. Patchy low attenuation in the deep hemispheric and periventricular white matter is nonspecific, but likely reflects chronic microvascular ischemic demyelination. Vascular: No hyperdense vessel or unexpected calcification. Skull: No evidence for fracture. No worrisome lytic or sclerotic lesion. Sinuses/Orbits: Mild chronic mucosal thickening noted right sphenoid sinus. Similar chronic changes left maxillary sinus. No mastoid effusion. Visualized portions of the globes and intraorbital fat are unremarkable. Other: None. CT CERVICAL SPINE FINDINGS Alignment: Normal. Skull base and vertebrae: No acute fracture. No primary bone lesion or focal pathologic process. Soft tissues and spinal canal: No prevertebral fluid or swelling. No visible canal hematoma. Disc levels: Loss of disc height with endplate degeneration noted C4-5, C5-6, and C6-7. Upper chest: No acute findings. Other: None IMPRESSION: 1. No acute intracranial abnormality. 2. Atrophy with chronic small vessel ischemic disease. 3. Degenerative changes in the cervical spine without fracture. Electronically Signed   By: Camellia Candle M.D.   On: 03/29/2024 06:08   DG Chest Portable  1 View Result Date: 03/29/2024 CLINICAL DATA:  Fever and vomiting. EXAM: PORTABLE CHEST 1 VIEW COMPARISON:  07/12/2020 FINDINGS: Low lung volumes. Asymmetric elevation right hemidiaphragm. Interstitial markings are diffusely coarsened with chronic features. Atelectasis or scarring noted at the lung bases, left greater than right. The cardio pericardial silhouette is  enlarged. Status post CABG and TAVR. Left-sided permanent pacemaker again noted. Telemetry leads overlie the chest. IMPRESSION: Low volume film without acute cardiopulmonary findings. Electronically Signed   By: Camellia Candle M.D.   On: 03/29/2024 05:19    Microbiology: Results for orders placed or performed during the hospital encounter of 03/29/24  Urine Culture (for pregnant, neutropenic or urologic patients or patients with an indwelling urinary catheter)     Status: Abnormal   Collection Time: 04/04/24  9:36 AM   Specimen: Urine, Clean Catch  Result Value Ref Range Status   Specimen Description URINE, CLEAN CATCH  Final   Special Requests   Final    NONE Performed at [facility], [address], [place], [place] [address]    Culture (A)  Final    >=100,000 COLONIES/mL ESCHERICHIA COLI 40,000 COLONIES/mL KLEBSIELLA PNEUMONIAE    Report Status 04/07/2024 FINAL  Final   Organism ID, Bacteria ESCHERICHIA COLI (A)  Final   Organism ID, Bacteria KLEBSIELLA PNEUMONIAE (A)  Final      Susceptibility   Escherichia coli - MIC*    AMPICILLIN >=32 RESISTANT Resistant     CEFAZOLIN  (URINE) Value in next row Sensitive      2 SENSITIVEThis is a modified FDA-approved test that has been validated and its performance characteristics determined by the reporting laboratory.  This laboratory is certified under the Clinical Laboratory Improvement Amendments CLIA as qualified to perform high complexity clinical laboratory testing.    CEFEPIME Value in next row Sensitive      2 SENSITIVEThis is a modified FDA-approved test that has been validated and its performance characteristics determined by the reporting laboratory.  This laboratory is certified under the Clinical Laboratory Improvement Amendments CLIA as qualified to perform high complexity clinical laboratory testing.    ERTAPENEM Value in next row Sensitive      2 SENSITIVEThis is a modified FDA-approved test that has been validated and its  performance characteristics determined by the reporting laboratory.  This laboratory is certified under the Clinical Laboratory Improvement Amendments CLIA as qualified to perform high complexity clinical laboratory testing.    CEFTRIAXONE Value in next row Sensitive      2 SENSITIVEThis is a modified FDA-approved test that has been validated and its performance characteristics determined by the reporting laboratory.  This laboratory is certified under the Clinical Laboratory Improvement Amendments CLIA as qualified to perform high complexity clinical laboratory testing.    CIPROFLOXACIN Value in next row Intermediate      2 SENSITIVEThis is a modified FDA-approved test that has been validated and its performance characteristics determined by the reporting laboratory.  This laboratory is certified under the Clinical Laboratory Improvement Amendments CLIA as qualified to perform high complexity clinical laboratory testing.    GENTAMICIN  Value in next row Sensitive      2 SENSITIVEThis is a modified FDA-approved test that has been validated and its performance characteristics determined by the reporting laboratory.  This laboratory is certified under the Clinical Laboratory Improvement Amendments CLIA as qualified to perform high complexity clinical laboratory testing.    NITROFURANTOIN Value in next row Sensitive      2 SENSITIVEThis is a modified  FDA-approved test that has been validated and its performance characteristics determined by the reporting laboratory.  This laboratory is certified under the Clinical Laboratory Improvement Amendments CLIA as qualified to perform high complexity clinical laboratory testing.    TRIMETH/SULFA Value in next row Sensitive      2 SENSITIVEThis is a modified FDA-approved test that has been validated and its performance characteristics determined by the reporting laboratory.  This laboratory is certified under the Clinical Laboratory Improvement Amendments CLIA as  qualified to perform high complexity clinical laboratory testing.    AMPICILLIN/SULBACTAM Value in next row Intermediate      2 SENSITIVEThis is a modified FDA-approved test that has been validated and its performance characteristics determined by the reporting laboratory.  This laboratory is certified under the Clinical Laboratory Improvement Amendments CLIA as qualified to perform high complexity clinical laboratory testing.    PIP/TAZO Value in next row Sensitive      <=4 SENSITIVEThis is a modified FDA-approved test that has been validated and its performance characteristics determined by the reporting laboratory.  This laboratory is certified under the Clinical Laboratory Improvement Amendments CLIA as qualified to perform high complexity clinical laboratory testing.    MEROPENEM Value in next row Sensitive      <=4 SENSITIVEThis is a modified FDA-approved test that has been validated and its performance characteristics determined by the reporting laboratory.  This laboratory is certified under the Clinical Laboratory Improvement Amendments CLIA as qualified to perform high complexity clinical laboratory testing.    * >=100,000 COLONIES/mL ESCHERICHIA COLI   Klebsiella pneumoniae - MIC*    AMPICILLIN Value in next row Resistant      <=4 SENSITIVEThis is a modified FDA-approved test that has been validated and its performance characteristics determined by the reporting laboratory.  This laboratory is certified under the Clinical Laboratory Improvement Amendments CLIA as qualified to perform high complexity clinical laboratory testing.    CEFAZOLIN  (URINE) Value in next row Sensitive      2 SENSITIVEThis is a modified FDA-approved test that has been validated and its performance characteristics determined by the reporting laboratory.  This laboratory is certified under the Clinical Laboratory Improvement Amendments CLIA as qualified to perform high complexity clinical laboratory testing.    CEFEPIME  Value in next row Sensitive      2 SENSITIVEThis is a modified FDA-approved test that has been validated and its performance characteristics determined by the reporting laboratory.  This laboratory is certified under the Clinical Laboratory Improvement Amendments CLIA as qualified to perform high complexity clinical laboratory testing.    ERTAPENEM Value in next row Sensitive      2 SENSITIVEThis is a modified FDA-approved test that has been validated and its performance characteristics determined by the reporting laboratory.  This laboratory is certified under the Clinical Laboratory Improvement Amendments CLIA as qualified to perform high complexity clinical laboratory testing.    CEFTRIAXONE Value in next row Sensitive      2 SENSITIVEThis is a modified FDA-approved test that has been validated and its performance characteristics determined by the reporting laboratory.  This laboratory is certified under the Clinical Laboratory Improvement Amendments CLIA as qualified to perform high complexity clinical laboratory testing.    CIPROFLOXACIN Value in next row Sensitive      2 SENSITIVEThis is a modified FDA-approved test that has been validated and its performance characteristics determined by the reporting laboratory.  This laboratory is certified under the Clinical Laboratory Improvement Amendments CLIA as qualified to perform high complexity  clinical laboratory testing.    GENTAMICIN  Value in next row Sensitive      2 SENSITIVEThis is a modified FDA-approved test that has been validated and its performance characteristics determined by the reporting laboratory.  This laboratory is certified under the Clinical Laboratory Improvement Amendments CLIA as qualified to perform high complexity clinical laboratory testing.    NITROFURANTOIN Value in next row Intermediate      2 SENSITIVEThis is a modified FDA-approved test that has been validated and its performance characteristics determined by the reporting  laboratory.  This laboratory is certified under the Clinical Laboratory Improvement Amendments CLIA as qualified to perform high complexity clinical laboratory testing.    TRIMETH/SULFA Value in next row Sensitive      2 SENSITIVEThis is a modified FDA-approved test that has been validated and its performance characteristics determined by the reporting laboratory.  This laboratory is certified under the Clinical Laboratory Improvement Amendments CLIA as qualified to perform high complexity clinical laboratory testing.    AMPICILLIN/SULBACTAM Value in next row Sensitive      2 SENSITIVEThis is a modified FDA-approved test that has been validated and its performance characteristics determined by the reporting laboratory.  This laboratory is certified under the Clinical Laboratory Improvement Amendments CLIA as qualified to perform high complexity clinical laboratory testing.    PIP/TAZO Value in next row Sensitive      <=4 SENSITIVEThis is a modified FDA-approved test that has been validated and its performance characteristics determined by the reporting laboratory.  This laboratory is certified under the Clinical Laboratory Improvement Amendments CLIA as qualified to perform high complexity clinical laboratory testing.    MEROPENEM Value in next row Sensitive      <=4 SENSITIVEThis is a modified FDA-approved test that has been validated and its performance characteristics determined by the reporting laboratory.  This laboratory is certified under the Clinical Laboratory Improvement Amendments CLIA as qualified to perform high complexity clinical laboratory testing.    * 40,000 COLONIES/mL KLEBSIELLA PNEUMONIAE    Labs: CBC: Recent Labs  Lab 04/01/24 0929 04/01/24 1439 04/02/24 0416 04/03/24 0915 04/04/24 0512  WBC 7.7  --  8.1 8.9 10.5  HGB 7.9* 7.9* 7.9* 10.5* 10.2*  HCT 23.8* 23.4* 23.5* 30.2* 29.6*  MCV 92.6  --  92.9 90.7 91.9  PLT 107*  --  110* 112* 122*   Basic Metabolic  Panel: Recent Labs  Lab 04/01/24 0929 04/02/24 0416 04/02/24 2220 04/03/24 0447 04/04/24 0512  NA 138 139 137 138 137  K 3.7 3.2* 3.9 4.7 4.3  CL 109 110 106 106 104  CO2 20* 21* 22 24 24   GLUCOSE 221* 96 92 96 104*  BUN 33* 22 16 13 16   CREATININE 1.58* 1.36* 1.28* 1.46* 1.46*  CALCIUM  7.6* 7.6* 7.7* 8.1* 7.9*  MG  --   --  1.9  --   --    Liver Function Tests: Recent Labs  Lab 04/01/24 0929 04/02/24 0416  AST 46* 34  ALT 33 35  ALKPHOS 47 49  BILITOT 0.9 1.0  PROT 3.9* 3.9*  ALBUMIN 2.2* 2.2*   CBG: Recent Labs  Lab 04/06/24 1218 04/06/24 1646 04/06/24 2036 04/07/24 0802 04/07/24 1131  GLUCAP 224* 181* 96 130* 175*    Discharge time spent: greater than 30 minutes.  Signed: [name], MD [facility] 04/07/2024

## 2024-04-14 DIAGNOSIS — E1121 Type 2 diabetes mellitus with diabetic nephropathy: Secondary | ICD-10-CM | POA: Diagnosis not present

## 2024-04-14 DIAGNOSIS — Z79899 Other long term (current) drug therapy: Secondary | ICD-10-CM | POA: Diagnosis not present

## 2024-04-14 DIAGNOSIS — Z6821 Body mass index (BMI) 21.0-21.9, adult: Secondary | ICD-10-CM | POA: Diagnosis not present

## 2024-04-14 DIAGNOSIS — A499 Bacterial infection, unspecified: Secondary | ICD-10-CM | POA: Diagnosis not present

## 2024-04-14 DIAGNOSIS — N390 Urinary tract infection, site not specified: Secondary | ICD-10-CM | POA: Diagnosis not present

## 2024-04-14 DIAGNOSIS — Z8719 Personal history of other diseases of the digestive system: Secondary | ICD-10-CM | POA: Diagnosis not present

## 2024-04-14 DIAGNOSIS — D500 Iron deficiency anemia secondary to blood loss (chronic): Secondary | ICD-10-CM | POA: Diagnosis not present

## 2024-04-14 DIAGNOSIS — K922 Gastrointestinal hemorrhage, unspecified: Secondary | ICD-10-CM | POA: Diagnosis not present

## 2024-04-14 DIAGNOSIS — I10 Essential (primary) hypertension: Secondary | ICD-10-CM | POA: Diagnosis not present

## 2024-04-24 DIAGNOSIS — E785 Hyperlipidemia, unspecified: Secondary | ICD-10-CM | POA: Diagnosis not present

## 2024-04-24 DIAGNOSIS — I10 Essential (primary) hypertension: Secondary | ICD-10-CM | POA: Diagnosis not present

## 2024-04-24 DIAGNOSIS — E1121 Type 2 diabetes mellitus with diabetic nephropathy: Secondary | ICD-10-CM | POA: Diagnosis not present

## 2024-04-24 DIAGNOSIS — N1832 Chronic kidney disease, stage 3b: Secondary | ICD-10-CM | POA: Diagnosis not present

## 2024-05-01 DIAGNOSIS — I2510 Atherosclerotic heart disease of native coronary artery without angina pectoris: Secondary | ICD-10-CM | POA: Diagnosis not present

## 2024-05-01 DIAGNOSIS — I480 Paroxysmal atrial fibrillation: Secondary | ICD-10-CM | POA: Diagnosis not present

## 2024-05-01 DIAGNOSIS — E1121 Type 2 diabetes mellitus with diabetic nephropathy: Secondary | ICD-10-CM | POA: Diagnosis not present

## 2024-05-01 DIAGNOSIS — D539 Nutritional anemia, unspecified: Secondary | ICD-10-CM | POA: Diagnosis not present

## 2024-05-01 DIAGNOSIS — E785 Hyperlipidemia, unspecified: Secondary | ICD-10-CM | POA: Diagnosis not present

## 2024-05-01 DIAGNOSIS — N1832 Chronic kidney disease, stage 3b: Secondary | ICD-10-CM | POA: Diagnosis not present

## 2024-05-01 DIAGNOSIS — R053 Chronic cough: Secondary | ICD-10-CM | POA: Diagnosis not present

## 2024-05-01 DIAGNOSIS — Z8546 Personal history of malignant neoplasm of prostate: Secondary | ICD-10-CM | POA: Diagnosis not present

## 2024-05-01 DIAGNOSIS — I10 Essential (primary) hypertension: Secondary | ICD-10-CM | POA: Diagnosis not present

## 2024-05-01 DIAGNOSIS — Z6821 Body mass index (BMI) 21.0-21.9, adult: Secondary | ICD-10-CM | POA: Diagnosis not present

## 2024-05-12 DIAGNOSIS — R42 Dizziness and giddiness: Secondary | ICD-10-CM | POA: Diagnosis not present

## 2024-05-12 DIAGNOSIS — R55 Syncope and collapse: Secondary | ICD-10-CM | POA: Diagnosis not present

## 2024-05-12 DIAGNOSIS — I480 Paroxysmal atrial fibrillation: Secondary | ICD-10-CM | POA: Diagnosis not present

## 2024-05-12 DIAGNOSIS — D539 Nutritional anemia, unspecified: Secondary | ICD-10-CM | POA: Diagnosis not present

## 2024-05-13 ENCOUNTER — Ambulatory Visit (INDEPENDENT_AMBULATORY_CARE_PROVIDER_SITE_OTHER): Admitting: Podiatry

## 2024-05-13 DIAGNOSIS — M79675 Pain in left toe(s): Secondary | ICD-10-CM | POA: Diagnosis not present

## 2024-05-13 DIAGNOSIS — B351 Tinea unguium: Secondary | ICD-10-CM | POA: Diagnosis not present

## 2024-05-13 DIAGNOSIS — M79674 Pain in right toe(s): Secondary | ICD-10-CM | POA: Diagnosis not present

## 2024-05-13 NOTE — Progress Notes (Unsigned)
 Subjective:  Patient ID: Bryan Carney, male    DOB: [birth year],  MRN: [id number]  Bryan Carney presents to clinic today for:  Chief Complaint  Patient presents with   [facility]    Takes Eliquis . Last A1c: 7.9.    Patient notes nails are thick, discolored, elongated and painful in shoegear when trying to ambulate.    PCP is [name], Amy A, NP.  Past Medical History:  Diagnosis Date   Anemia    Arthritis    CAD (coronary artery disease)    Chronic kidney disease    stage 3 per daughter   COPD (chronic obstructive pulmonary disease) (HCC)    Diabetes (HCC)    Fibromyalgia    25 years ago.   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 Verlin and Dr. Dusty   Squamous cell carcinoma    Past Surgical History:  Procedure Laterality Date   BACK SURGERY     CARDIAC CATHETERIZATION  10/15/1997   Recommend complete revascularization by CABG   CARDIOVASCULAR STRESS TEST  06/12/2012   No evidence of ischemia   CAROTID DOPPLER  08/27/2012   Rt bulb/proximal ICA demonstrated a mild-moderate amount of fibrous plaque without evidence of a significant diameter reduction or any other vascular abnormality.   CAROTID ENDARTERECTOMY Left 02/23/1995   CORONARY ANGIOPLASTY     CORONARY ARTERY BYPASS GRAFT  10/19/1997   x5. LIMA-LAD, SVG-PDA, SVG-OM1, seq SVG-fist and second branches of ramus intermedius.   ESOPHAGOGASTRODUODENOSCOPY N/A 03/30/2024   Procedure: EGD (ESOPHAGOGASTRODUODENOSCOPY);  Surgeon: Leigh Elspeth SQUIBB, MD;  Location: [facility] ENDOSCOPY;  Service:  Gastroenterology;  Laterality: N/A;   ESOPHAGOGASTRODUODENOSCOPY N/A 04/03/2024   Procedure: EGD (ESOPHAGOGASTRODUODENOSCOPY);  Surgeon: Leigh Elspeth SQUIBB, MD;  Location: [facility] ENDOSCOPY;  Service: Gastroenterology;  Laterality: N/A;   HERNIA REPAIR     groin area.   INSERT / REPLACE / REMOVE PACEMAKER     NO PAST SURGERIES     PACEMAKER IMPLANT N/A 02/27/2018   Procedure: PACEMAKER IMPLANT;  Surgeon: Inocencio Soyla Lunger, MD;  Location: [facility] INVASIVE CV LAB;  Service: Cardiovascular;  Laterality: N/A;   RIGHT/LEFT HEART CATH AND CORONARY/GRAFT ANGIOGRAPHY N/A 05/27/2020   Procedure: RIGHT/LEFT HEART CATH AND CORONARY/GRAFT ANGIOGRAPHY;  Surgeon: Wonda Sharper, MD;  Location: [facility] INVASIVE CV LAB;  Service: Cardiovascular;  Laterality: N/A;   TEE WITHOUT CARDIOVERSION N/A 05/27/2020   Procedure: TRANSESOPHAGEAL ECHOCARDIOGRAM (TEE);  Surgeon: Francyne Headland, MD;  Location: [facility] ENDOSCOPY;  Service: Cardiovascular;  Laterality: N/A;   TEE WITHOUT CARDIOVERSION N/A 07/12/2020   Procedure: TRANSESOPHAGEAL ECHOCARDIOGRAM (TEE);  Surgeon: Verlin Lonni BIRCH, MD;  Location: [facility] INVASIVE CV LAB;  Service: Open Heart Surgery;  Laterality: N/A;   TRANSCATHETER AORTIC VALVE REPLACEMENT, TRANSFEMORAL N/A 07/12/2020   Procedure: TRANSCATHETER AORTIC VALVE REPLACEMENT, TRANSFEMORAL;  Surgeon: Verlin Lonni BIRCH, MD;  Location: [facility] INVASIVE CV LAB;  Service: Open Heart Surgery;  Laterality: N/A;   Allergies  Allergen Reactions   Lopressor  [Metoprolol  Tartrate] Other (  See Comments)    Severe BP drop   Lipitor [Atorvastatin ] Other (See Comments)    Muscle aches    Review of Systems: Negative except as noted in the HPI.  Objective:  Bryan Carney is a pleasant 83 y.o. male in NAD. AAO x 3.  Vascular Examination: Capillary refill time is 3-5 seconds to toes bilateral. Palpable pedal pulses b/l LE. Digital hair present b/l.  Skin temperature gradient WNL b/l. No varicosities b/l. No cyanosis noted b/l.    Dermatological Examination: Pedal skin with normal turgor, texture and tone b/l. No open wounds. No interdigital macerations b/l. Toenails x10 are 3mm thick, discolored, dystrophic with subungual debris. There is pain with compression of the nail plates.  They are elongated x10     Latest Ref Rng & Units 03/29/2024    4:07 PM  Hemoglobin A1C  Hemoglobin-A1c 4.8 - 5.6 % 6.8    Assessment/Plan: 1. Pain due to onychomycosis of toenails of both feet    The mycotic toenails were sharply debrided x10 with sterile nail nippers and a power debriding burr to decrease bulk/thickness and length.    Return in about 3 months (around 08/13/2024) for [facility].   Awanda CHARM Imperial, DPM, FACFAS [facility]     [address] [place], [place] [address]                Office [phone]  Fax [phone]

## 2024-05-26 ENCOUNTER — Ambulatory Visit: Attending: *Deleted

## 2024-05-26 DIAGNOSIS — I495 Sick sinus syndrome: Secondary | ICD-10-CM | POA: Diagnosis not present

## 2024-05-27 LAB — CUP PACEART REMOTE DEVICE CHECK
Battery Remaining Longevity: 60 mo
Battery Voltage: 2.96 V
Brady Statistic AP VP Percent: 0.13 %
Brady Statistic AP VS Percent: 99.31 %
Brady Statistic AS VP Percent: 0.01 %
Brady Statistic AS VS Percent: 0.54 %
Brady Statistic RA Percent Paced: 99.02 %
Brady Statistic RV Percent Paced: 0.33 %
Date Time Interrogation Session: 20251202140400
Implantable Lead Connection Status: 753985
Implantable Lead Connection Status: 753985
Implantable Lead Implant Date: 20190905
Implantable Lead Implant Date: 20190905
Implantable Lead Location: 753859
Implantable Lead Location: 753860
Implantable Lead Model: 3830
Implantable Lead Model: 5076
Implantable Pulse Generator Implant Date: 20190905
Lead Channel Impedance Value: 266 Ohm
Lead Channel Impedance Value: 342 Ohm
Lead Channel Impedance Value: 380 Ohm
Lead Channel Impedance Value: 418 Ohm
Lead Channel Pacing Threshold Amplitude: 0.875 V
Lead Channel Pacing Threshold Amplitude: 1.75 V
Lead Channel Pacing Threshold Pulse Width: 0.4 ms
Lead Channel Pacing Threshold Pulse Width: 0.4 ms
Lead Channel Sensing Intrinsic Amplitude: 0.75 mV
Lead Channel Sensing Intrinsic Amplitude: 0.75 mV
Lead Channel Sensing Intrinsic Amplitude: 7.5 mV
Lead Channel Sensing Intrinsic Amplitude: 7.5 mV
Lead Channel Setting Pacing Amplitude: 1.75 V
Lead Channel Setting Pacing Amplitude: 2.5 V
Lead Channel Setting Pacing Pulse Width: 0.8 ms
Lead Channel Setting Sensing Sensitivity: 1.2 mV
Zone Setting Status: 755011
Zone Setting Status: 755011

## 2024-05-29 NOTE — Progress Notes (Signed)
 Remote PPM Transmission

## 2024-06-27 ENCOUNTER — Ambulatory Visit: Payer: Self-pay | Admitting: Cardiology

## 2024-08-12 ENCOUNTER — Ambulatory Visit: Admitting: Podiatry

## 2024-08-25 ENCOUNTER — Encounter

## 2024-11-24 ENCOUNTER — Encounter

## 2025-02-23 ENCOUNTER — Encounter

## 2025-05-25 ENCOUNTER — Encounter

## 2025-08-24 ENCOUNTER — Encounter

## 2025-11-23 ENCOUNTER — Encounter
